# Patient Record
Sex: Male | Born: 1950 | Race: Black or African American | Hispanic: No | Marital: Married | Smoking: Never smoker
Health system: Southern US, Community
[De-identification: ages and names within clinical notes are randomized; demographics above are authoritative.]

## PROBLEM LIST (undated history)

## (undated) DIAGNOSIS — D649 Anemia, unspecified: Secondary | ICD-10-CM

## (undated) DIAGNOSIS — R569 Unspecified convulsions: Secondary | ICD-10-CM

## (undated) DIAGNOSIS — E7800 Pure hypercholesterolemia, unspecified: Secondary | ICD-10-CM

## (undated) DIAGNOSIS — I10 Essential (primary) hypertension: Secondary | ICD-10-CM

## (undated) DIAGNOSIS — C61 Malignant neoplasm of prostate: Secondary | ICD-10-CM

## (undated) DIAGNOSIS — F1020 Alcohol dependence, uncomplicated: Secondary | ICD-10-CM

## (undated) HISTORY — PX: CIRCUMCISION: SUR203

## (undated) HISTORY — DX: Alcohol dependence, uncomplicated: F10.20

## (undated) HISTORY — PX: PROSTATE BIOPSY: SHX241

## (undated) HISTORY — DX: Anemia, unspecified: D64.9

## (undated) HISTORY — DX: Unspecified convulsions: R56.9

## (undated) HISTORY — DX: Pure hypercholesterolemia, unspecified: E78.00

## (undated) HISTORY — PX: NO PAST SURGERIES: SHX2092

---

## 2016-06-20 ENCOUNTER — Encounter (INDEPENDENT_AMBULATORY_CARE_PROVIDER_SITE_OTHER): Payer: Self-pay | Admitting: Internal Medicine

## 2016-07-03 ENCOUNTER — Ambulatory Visit (INDEPENDENT_AMBULATORY_CARE_PROVIDER_SITE_OTHER): Payer: Self-pay | Admitting: Internal Medicine

## 2016-07-04 ENCOUNTER — Ambulatory Visit (INDEPENDENT_AMBULATORY_CARE_PROVIDER_SITE_OTHER): Payer: Medicare Other | Admitting: Internal Medicine

## 2016-07-04 ENCOUNTER — Encounter (INDEPENDENT_AMBULATORY_CARE_PROVIDER_SITE_OTHER): Payer: Self-pay | Admitting: *Deleted

## 2016-07-04 ENCOUNTER — Encounter (INDEPENDENT_AMBULATORY_CARE_PROVIDER_SITE_OTHER): Payer: Self-pay | Admitting: Internal Medicine

## 2016-07-04 ENCOUNTER — Encounter (INDEPENDENT_AMBULATORY_CARE_PROVIDER_SITE_OTHER): Payer: Self-pay

## 2016-07-04 VITALS — BP 112/80 | HR 80 | Temp 98.1°F | Ht 64.0 in | Wt 135.5 lb

## 2016-07-04 DIAGNOSIS — D500 Iron deficiency anemia secondary to blood loss (chronic): Secondary | ICD-10-CM

## 2016-07-04 DIAGNOSIS — E785 Hyperlipidemia, unspecified: Secondary | ICD-10-CM | POA: Insufficient documentation

## 2016-07-04 DIAGNOSIS — F1020 Alcohol dependence, uncomplicated: Secondary | ICD-10-CM | POA: Diagnosis not present

## 2016-07-04 DIAGNOSIS — I10 Essential (primary) hypertension: Secondary | ICD-10-CM | POA: Insufficient documentation

## 2016-07-04 DIAGNOSIS — R569 Unspecified convulsions: Secondary | ICD-10-CM | POA: Insufficient documentation

## 2016-07-04 LAB — CBC
HEMATOCRIT: 26.2 % — AB (ref 38.5–50.0)
HEMOGLOBIN: 9.2 g/dL — AB (ref 13.2–17.1)
MCH: 33.8 pg — AB (ref 27.0–33.0)
MCHC: 35.1 g/dL (ref 32.0–36.0)
MCV: 96.3 fL (ref 80.0–100.0)
MPV: 9.7 fL (ref 7.5–12.5)
Platelets: 188 10*3/uL (ref 140–400)
RBC: 2.72 MIL/uL — ABNORMAL LOW (ref 4.20–5.80)
RDW: 13.8 % (ref 11.0–15.0)
WBC: 5 10*3/uL (ref 3.8–10.8)

## 2016-07-04 NOTE — Progress Notes (Signed)
   Subjective:    Patient ID: Clifford [name], male    DOB: 1951-06-03, 65 y.o.   MRN: [id number]  HPI Referred by Dr. 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 Wife denies hx of patient having anemia.  He denies any rectal bleeding. Appetite is not good. He has lost about 20 pounds over a couple of years. He has a BM daily. No melena.  He has been a heavy drinker in the past. Drinks a fifth or two a week.  He has never been diagnosed with cirrhosis.  He has never had a colonoscopy.  No NSAIDs.  05/31/2016 albumin 4.6, total bili 0.9, ALP 44, AST 76, ALT 44. H and H 9.2 and 25.4, MCH 95.1, Platelet ct 98.  Review of Systems No past medical history on file.  No past surgical history on file.  No Known Allergies  No current outpatient prescriptions on file prior to visit.   No current facility-administered medications on file prior to visit.    Current Outpatient Prescriptions  Medication Sig Dispense Refill  . amLODipine (NORVASC) 10 MG tablet Take 10 mg by mouth daily.    Marland Kitchen lisinopril (PRINIVIL,ZESTRIL) 40 MG tablet Take 40 mg by mouth daily.    Marland Kitchen loratadine (CLARITIN) 10 MG tablet Take 10 mg by mouth daily.    . Multiple Vitamins-Iron (GERITOL EXTEND PO) Take by mouth.    . pantoprazole (PROTONIX) 40 MG tablet Take 40 mg by mouth daily.     No current facility-administered medications for this visit.        Objective:   Physical Exam Blood pressure 112/80, pulse 80, temperature 98.1 F (36.7 C), height 5\' 4"  (1.626 m), weight 135 lb 8 oz (61.5 kg).  Alert and oriented. Skin warm and dry. Oral mucosa is moist.   . Sclera anicteric, conjunctivae is pink. Thyroid not enlarged. No cervical lymphadenopathy. Lungs clear. Heart regular rate and rhythm.  Abdomen is soft. Bowel sounds are positive. No hepatomegaly. No abdominal masses felt. No tenderness.  No edema to lower extremities.  Stool brown and guaiac negative.       Assessment & Plan:  Anemia. Am going to get iron  studies.May also need an EGD.  Will need a colonoscopy,    Alcoholism: Will get an US abdomen to be sure he does not have cirrhosis.  (May need EGD with possible banding).  Further recommendations to follow.

## 2016-07-04 NOTE — Patient Instructions (Signed)
Labs and US.  Further recommendations to follow.  

## 2016-07-05 LAB — FERRITIN: Ferritin: 908 ng/mL — ABNORMAL HIGH (ref 20–380)

## 2016-07-05 LAB — IRON AND TIBC
%SAT: 51 % (ref 15–60)
Iron: 203 ug/dL — ABNORMAL HIGH (ref 50–180)
TIBC: 396 ug/dL (ref 250–425)
UIBC: 193 ug/dL (ref 125–400)

## 2016-07-11 ENCOUNTER — Telehealth (INDEPENDENT_AMBULATORY_CARE_PROVIDER_SITE_OTHER): Payer: Self-pay | Admitting: *Deleted

## 2016-07-11 ENCOUNTER — Ambulatory Visit (HOSPITAL_COMMUNITY)
Admission: RE | Admit: 2016-07-11 | Discharge: 2016-07-11 | Disposition: A | Discharge status: Home / Self Care | Payer: Medicare Other | Source: Ambulatory Visit | Attending: Internal Medicine | Admitting: Internal Medicine

## 2016-07-11 ENCOUNTER — Encounter (INDEPENDENT_AMBULATORY_CARE_PROVIDER_SITE_OTHER): Payer: Self-pay | Admitting: Internal Medicine

## 2016-07-11 ENCOUNTER — Telehealth (INDEPENDENT_AMBULATORY_CARE_PROVIDER_SITE_OTHER): Payer: Self-pay | Admitting: Internal Medicine

## 2016-07-11 ENCOUNTER — Other Ambulatory Visit (INDEPENDENT_AMBULATORY_CARE_PROVIDER_SITE_OTHER): Payer: Self-pay | Admitting: Internal Medicine

## 2016-07-11 ENCOUNTER — Encounter (INDEPENDENT_AMBULATORY_CARE_PROVIDER_SITE_OTHER): Payer: Self-pay | Admitting: *Deleted

## 2016-07-11 DIAGNOSIS — R932 Abnormal findings on diagnostic imaging of liver and biliary tract: Secondary | ICD-10-CM | POA: Insufficient documentation

## 2016-07-11 DIAGNOSIS — F1020 Alcohol dependence, uncomplicated: Secondary | ICD-10-CM | POA: Insufficient documentation

## 2016-07-11 DIAGNOSIS — D638 Anemia in other chronic diseases classified elsewhere: Secondary | ICD-10-CM

## 2016-07-11 MED ORDER — PEG 3350-KCL-NA BICARB-NACL 420 G PO SOLR: 4000.0000 mL | Freq: Once | ORAL | 0 refills | Status: AC

## 2016-07-11 MED ORDER — PEG 3350-KCL-NA BICARB-NACL 420 G PO SOLR: 4000.0000 mL | Freq: Once | ORAL | 0 refills | Status: DC

## 2016-07-11 NOTE — Telephone Encounter (Signed)
Clifford [name], Colonoscopy. I have spoken with wife.

## 2016-07-11 NOTE — Telephone Encounter (Signed)
Patient needs trilye

## 2016-07-11 NOTE — Telephone Encounter (Signed)
Patient needs trilyte 

## 2016-07-11 NOTE — Telephone Encounter (Signed)
TCS sch'd 09/13/16, patient wife aware, instructions mailed

## 2016-09-13 ENCOUNTER — Ambulatory Visit (HOSPITAL_COMMUNITY)
Admission: RE | Admit: 2016-09-13 | Discharge: 2016-09-13 | Disposition: A | Discharge status: Home / Self Care | Payer: Medicare Other | Source: Ambulatory Visit | Attending: Internal Medicine | Admitting: Internal Medicine

## 2016-09-13 ENCOUNTER — Encounter (HOSPITAL_COMMUNITY)
Admission: RE | Disposition: A | Discharge status: Home / Self Care | Payer: Self-pay | Source: Ambulatory Visit | Attending: Internal Medicine

## 2016-09-13 ENCOUNTER — Encounter (HOSPITAL_COMMUNITY): Payer: Self-pay

## 2016-09-13 DIAGNOSIS — K648 Other hemorrhoids: Secondary | ICD-10-CM | POA: Insufficient documentation

## 2016-09-13 DIAGNOSIS — K644 Residual hemorrhoidal skin tags: Secondary | ICD-10-CM | POA: Diagnosis not present

## 2016-09-13 DIAGNOSIS — D649 Anemia, unspecified: Secondary | ICD-10-CM | POA: Diagnosis not present

## 2016-09-13 DIAGNOSIS — Z1211 Encounter for screening for malignant neoplasm of colon: Secondary | ICD-10-CM | POA: Diagnosis not present

## 2016-09-13 DIAGNOSIS — I10 Essential (primary) hypertension: Secondary | ICD-10-CM | POA: Diagnosis not present

## 2016-09-13 DIAGNOSIS — E7800 Pure hypercholesterolemia, unspecified: Secondary | ICD-10-CM | POA: Diagnosis not present

## 2016-09-13 DIAGNOSIS — D638 Anemia in other chronic diseases classified elsewhere: Secondary | ICD-10-CM

## 2016-09-13 HISTORY — PX: COLONOSCOPY: SHX5424

## 2016-09-13 HISTORY — DX: Essential (primary) hypertension: I10

## 2016-09-13 LAB — CBC
HCT: 29.2 % — ABNORMAL LOW (ref 39.0–52.0)
Hemoglobin: 9.8 g/dL — ABNORMAL LOW (ref 13.0–17.0)
MCH: 32 pg (ref 26.0–34.0)
MCHC: 33.6 g/dL (ref 30.0–36.0)
MCV: 95.4 fL (ref 78.0–100.0)
PLATELETS: 177 10*3/uL (ref 150–400)
RBC: 3.06 MIL/uL — ABNORMAL LOW (ref 4.22–5.81)
RDW: 13.2 % (ref 11.5–15.5)
WBC: 6.5 10*3/uL (ref 4.0–10.5)

## 2016-09-13 SURGERY — COLONOSCOPY
Anesthesia: Moderate Sedation

## 2016-09-13 MED ORDER — MIDAZOLAM HCL 5 MG/5ML IJ SOLN
INTRAMUSCULAR | Status: DC | PRN
  Administered 2016-09-13 (×2): 2 mg via INTRAVENOUS

## 2016-09-13 MED ORDER — MEPERIDINE HCL 50 MG/ML IJ SOLN
INTRAMUSCULAR | Status: AC
Start: 2016-09-13 — End: 2016-09-14
  Filled 2016-09-13: qty 1

## 2016-09-13 MED ORDER — SODIUM CHLORIDE 0.9 % IV SOLN
INTRAVENOUS | Status: DC
  Administered 2016-09-13: 13:00:00 via INTRAVENOUS

## 2016-09-13 MED ORDER — MIDAZOLAM HCL 5 MG/5ML IJ SOLN
INTRAMUSCULAR | Status: AC
  Filled 2016-09-13: qty 10

## 2016-09-13 MED ORDER — MEPERIDINE HCL 50 MG/ML IJ SOLN
INTRAMUSCULAR | Status: DC | PRN
  Administered 2016-09-13 (×2): 25 mg

## 2016-09-13 NOTE — Op Note (Signed)
[facility] Patient Name: Clifford [name] Procedure Date: 09/13/2016 1:38 PM MRN: [id number] Date of Birth: 1951-05-19 Attending MD: Hildred Laser , MD CSN: [id number] Age: 64 Admit Type: Outpatient Procedure:                Colonoscopy Indications:              Screening for colorectal malignant neoplasm Providers:                Hildred Laser, MD, Lurline Del, RN, Charlyne Petrin                            RN, RN, Purcell Nails. [place], [employer] Referring MD:             [name], MD Medicines:                Meperidine 50 mg IV, Midazolam 4 mg IV Complications:            No immediate complications. Estimated Blood Loss:     Estimated blood loss: none. Procedure:                Pre-Anesthesia Assessment:                           - Prior to the procedure, a History and Physical                            was performed, and patient medications and                            allergies were reviewed. The patient's tolerance of                            previous anesthesia was also reviewed. The risks                            and benefits of the procedure and the sedation                            options and risks were discussed with the patient.                            All questions were answered, and informed consent                            was obtained. Prior Anticoagulants: The patient has                            taken no previous anticoagulant or antiplatelet                            agents. ASA Grade Assessment: II - A patient with                            mild systemic disease. After reviewing the risks  and benefits, the patient was deemed in                            satisfactory condition to undergo the procedure.                           After obtaining informed consent, the colonoscope                            was passed under direct vision. Throughout the                            procedure, the patient's blood  pressure, pulse, and                            oxygen saturations were monitored continuously. The                            [id number]) scope was introduced through                            the anus and advanced to the the cecum, identified                            by appendiceal orifice and ileocecal valve. The                            colonoscopy was performed without difficulty. The                            patient tolerated the procedure well. The quality                            of the bowel preparation was adequate. The                            ileocecal valve, appendiceal orifice, and rectum                            were photographed. Scope In: 1:55:09 PM Scope Out: 2:11:29 PM Scope Withdrawal Time: 0 hours 9 minutes 16 seconds  Total Procedure Duration: 0 hours 16 minutes 20 seconds  Findings:      The colon (entire examined portion) appeared normal.      External and internal hemorrhoids were found during retroflexion. The       hemorrhoids were small. Impression:               - The entire examined colon is normal.                           - External and internal hemorrhoids.                           - No specimens collected.  Please note patient's hgb is 9.8 g and platelet                            count 177K Moderate Sedation:      Moderate (conscious) sedation was administered by the endoscopy nurse       and supervised by the endoscopist. The following parameters were       monitored: oxygen saturation, heart rate, blood pressure, CO2       capnography and response to care. Total physician intraservice time was       23 minutes. Recommendation:           - Patient has a contact number available for                            emergencies. The signs and symptoms of potential                            delayed complications were discussed with the                            patient. Return to normal activities tomorrow.                             Written discharge instructions were provided to the                            patient.                           - Resume previous diet today.                           - Resume previous diet.                           - Continue present medications.                           - Repeat colonoscopy in 10 years for screening                            purposes. Procedure Code(s):        --- Professional ---                           [phone], Colonoscopy, flexible; diagnostic, including                            collection of specimen(s) by brushing or washing,                            when performed (separate procedure)                           99152, Moderate sedation services provided by the  same physician or other qualified health care                            professional performing the diagnostic or                            therapeutic service that the sedation supports,                            requiring the presence of an independent trained                            observer to assist in the monitoring of the                            patient's level of consciousness and physiological                            status; initial 15 minutes of intraservice time,                            patient age 81 years or older                           [phone], Moderate sedation services; each additional                            15 minutes intraservice time Diagnosis Code(s):        --- Professional ---                           Z12.11, Encounter for screening for malignant                            neoplasm of colon                           K64.8, Other hemorrhoids CPT copyright 2016 American Medical Association. All rights reserved. The codes documented in this report are preliminary and upon coder review may  be revised to meet current compliance requirements. Hildred Laser, MD Hildred Laser, MD 09/13/2016 2:20:40 PM This  report has been signed electronically. Number of Addenda: 0

## 2016-09-13 NOTE — H&P (Signed)
Clifford [name] is an 65 y.o. male.   Chief Complaint: Patient is here for colonoscopy. HPI: Patient is 65 year old male who was recently found to be anemic. However he denies melena or rectal bleeding. Patient has long history of excessive alcohol use. He has lost some weight over the last couple months. He says he has slacked off on his drinking. He was also noted to have platelet count of 98,000 in July 2017. Iron studies are not consistent with iron deficiency anemia. Similarly B12 and folate levels were normal. AST was mildly elevated consistent with alcoholic hepatitis. Ultrasound shows fatty liver but no changes to suggest cirrhosis or portal hypertension. He is undergoing colonoscopy primarily for screening purposes. Family history is negative for CRC.  Past Medical History:  Diagnosis Date  . Alcoholism ([place])   . Anemia   . High cholesterol   . Hypertension   . Seizures ([place])     Past Surgical History:  Procedure Laterality Date  . NO PAST SURGERIES      History reviewed. No pertinent family history. Social History:  reports that he has never smoked. He has never used smokeless tobacco. He reports that he drinks alcohol. He reports that he does not use drugs.  Allergies:  Allergies  Allergen Reactions  . Lisinopril Swelling    Medications Prior to Admission  Medication Sig Dispense Refill  . acetaminophen (TYLENOL) 500 MG tablet Take 500 mg by mouth every 6 (six) hours as needed (for pain.).    Marland Kitchen amLODipine (NORVASC) 10 MG tablet Take 10 mg by mouth daily.    . cetirizine (ZYRTEC) 10 MG tablet Take 10 mg by mouth daily as needed for allergies.    . Multiple Vitamin (MULTIVITAMIN WITH MINERALS) TABS tablet Take 1 tablet by mouth daily.    . pantoprazole (PROTONIX) 40 MG tablet Take 40 mg by mouth daily.      No results found for this or any previous visit (from the past 48 hour(s)). No results found.  ROS  Blood pressure (!) 154/90, pulse 87, temperature 98.9 F  (37.2 C), temperature source Oral, resp. rate (!) 22, height 5\' 4"  (1.626 m), weight 135 lb (61.2 kg), SpO2 100 %. Physical Exam  Constitutional: He appears well-developed and well-nourished.  HENT:  Mouth/Throat: Oropharynx is clear and moist.  Eyes: Conjunctivae are normal. No scleral icterus.  Neck: No thyromegaly present.  Cardiovascular: Normal rate, regular rhythm and normal heart sounds.   No murmur heard. Respiratory: Effort normal and breath sounds normal.  GI: Soft. He exhibits no distension and no mass. There is no tenderness.  Musculoskeletal: He exhibits no edema.  Lymphadenopathy:    He has no cervical adenopathy.  Neurological: He is alert.  Skin: Skin is warm and dry.     Assessment/Plan Average risk screening colonoscopy. Patient has anemia possibly due to chronic disease or toxic effect of alcohol on bone marrow. Will check CBC today.  Hildred Laser, MD 09/13/2016, 1:36 PM

## 2016-09-13 NOTE — Discharge Instructions (Signed)
Resume usual medications and diet. No driving for 24 hours. Follow with Dr. Kenton Kingfisher in 2 months.       Colonoscopy, Care After These instructions give you information on caring for yourself after your procedure. Your doctor may also give you more specific instructions. Call your doctor if you have any problems or questions after your procedure. HOME CARE  Do not drive for 24 hours.  Do not sign important papers or use machinery for 24 hours.  You may shower.  You may go back to your usual activities, but go slower for the first 24 hours.  Take rest breaks often during the first 24 hours.  Walk around or use warm packs on your belly (abdomen) if you have belly cramping or gas.  Drink enough fluids to keep your pee (urine) clear or pale yellow.  Resume your normal diet. Avoid heavy or fried foods.  Avoid drinking alcohol for 24 hours or as told by your doctor.  Only take medicines as told by your doctor. If a tissue sample (biopsy) was taken during the procedure:   Do not take aspirin or blood thinners for 7 days, or as told by your doctor.  Do not drink alcohol for 7 days, or as told by your doctor.  Eat soft foods for the first 24 hours. GET HELP IF: You still have a small amount of blood in your poop (stool) 2-3 days after the procedure. GET HELP RIGHT AWAY IF:  You have more than a small amount of blood in your poop.  You see clumps of tissue (blood clots) in your poop.  Your belly is puffy (swollen).  You feel sick to your stomach (nauseous) or throw up (vomit).  You have a fever.  You have belly pain that gets worse and medicine does not help. MAKE SURE YOU:  Understand these instructions.  Will watch your condition.  Will get help right away if you are not doing well or get worse.   This information is not intended to replace advice given to you by your health care provider. Make sure you discuss any questions you have with your health care  provider.   Document Released: 12/02/2010 Document Revised: 11/04/2013 Document Reviewed: 07/07/2013 Elsevier Interactive Patient Education 2016 [employer].   Hemorrhoids Hemorrhoids are swollen veins around the rectum or anus. There are two types of hemorrhoids:   Internal hemorrhoids. These occur in the veins just inside the rectum. They may poke through to the outside and become irritated and painful.  External hemorrhoids. These occur in the veins outside the anus and can be felt as a painful swelling or hard lump near the anus. CAUSES  Pregnancy.   Obesity.   Constipation or diarrhea.   Straining to have a bowel movement.   Sitting for long periods on the toilet.  Heavy lifting or other activity that caused you to strain.  Anal intercourse. SYMPTOMS   Pain.   Anal itching or irritation.   Rectal bleeding.   Fecal leakage.   Anal swelling.   One or more lumps around the anus.  DIAGNOSIS  Your caregiver may be able to diagnose hemorrhoids by visual examination. Other examinations or tests that may be performed include:   Examination of the rectal area with a gloved hand (digital rectal exam).   Examination of anal canal using a small tube (scope).   A blood test if you have lost a significant amount of blood.  A test to look inside the colon (sigmoidoscopy  or colonoscopy). TREATMENT Most hemorrhoids can be treated at home. However, if symptoms do not seem to be getting better or if you have a lot of rectal bleeding, your caregiver may perform a procedure to help make the hemorrhoids get smaller or remove them completely. Possible treatments include:   Placing a rubber band at the base of the hemorrhoid to cut off the circulation (rubber band ligation).   Injecting a chemical to shrink the hemorrhoid (sclerotherapy).   Using a tool to burn the hemorrhoid (infrared light therapy).   Surgically removing the hemorrhoid (hemorrhoidectomy).    Stapling the hemorrhoid to block blood flow to the tissue (hemorrhoid stapling).  HOME CARE INSTRUCTIONS   Eat foods with fiber, such as whole grains, beans, nuts, fruits, and vegetables. Ask your doctor about taking products with added fiber in them (fibersupplements).  Increase fluid intake. Drink enough water and fluids to keep your urine clear or pale yellow.   Exercise regularly.   Go to the bathroom when you have the urge to have a bowel movement. Do not wait.   Avoid straining to have bowel movements.   Keep the anal area dry and clean. Use wet toilet paper or moist towelettes after a bowel movement.   Medicated creams and suppositories may be used or applied as directed.   Only take over-the-counter or prescription medicines as directed by your caregiver.   Take warm sitz baths for 15-20 minutes, 3-4 times a day to ease pain and discomfort.   Place ice packs on the hemorrhoids if they are tender and swollen. Using ice packs between sitz baths may be helpful.   Put ice in a plastic bag.   Place a towel between your skin and the bag.   Leave the ice on for 15-20 minutes, 3-4 times a day.   Do not use a donut-shaped pillow or sit on the toilet for long periods. This increases blood pooling and pain.  SEEK MEDICAL CARE IF:  You have increasing pain and swelling that is not controlled by treatment or medicine.  You have uncontrolled bleeding.  You have difficulty or you are unable to have a bowel movement.  You have pain or inflammation outside the area of the hemorrhoids. MAKE SURE YOU:  Understand these instructions.  Will watch your condition.  Will get help right away if you are not doing well or get worse.   This information is not intended to replace advice given to you by your health care provider. Make sure you discuss any questions you have with your health care provider.   Document Released: 10/27/2000 Document Revised: 10/16/2012  Document Reviewed: 09/03/2012 Elsevier Interactive Patient Education [employer].

## 2016-09-15 ENCOUNTER — Encounter (HOSPITAL_COMMUNITY): Payer: Self-pay | Admitting: Internal Medicine

## 2016-10-26 ENCOUNTER — Other Ambulatory Visit (HOSPITAL_COMMUNITY): Payer: Self-pay | Admitting: Urology

## 2016-10-26 DIAGNOSIS — C61 Malignant neoplasm of prostate: Secondary | ICD-10-CM

## 2016-11-09 ENCOUNTER — Encounter (HOSPITAL_COMMUNITY)
Admission: RE | Admit: 2016-11-09 | Discharge: 2016-11-09 | Disposition: A | Discharge status: Home / Self Care | Payer: Medicare Other | Source: Ambulatory Visit | Attending: Urology | Admitting: Urology

## 2016-11-09 ENCOUNTER — Encounter (INDEPENDENT_AMBULATORY_CARE_PROVIDER_SITE_OTHER): Payer: Self-pay

## 2016-11-09 DIAGNOSIS — C61 Malignant neoplasm of prostate: Secondary | ICD-10-CM | POA: Diagnosis present

## 2016-11-09 MED ORDER — TECHNETIUM TC 99M MEDRONATE IV KIT
25.0000 | PACK | Freq: Once | INTRAVENOUS | Status: AC | PRN
  Administered 2016-11-09: 21.9 via INTRAVENOUS

## 2016-11-17 ENCOUNTER — Encounter: Payer: Self-pay | Admitting: Radiation Oncology

## 2016-12-01 ENCOUNTER — Encounter: Payer: Self-pay | Admitting: Radiation Oncology

## 2016-12-01 NOTE — Progress Notes (Addendum)
GU Location of Tumor / Histology: prostatic adenocarcinoma  If Prostate Cancer, Gleason Score is (4 + 4) and PSA is (13.1) on 08/24/16.   Clifford [name] presented with an elevated PSA of 13.1 in October 2017.   Biopsies of prostate (if applicable) revealed:    Past/Anticipated interventions by urology, if any: prostate biopsy, referral to Dr. Tammi Klippel, planning for Lupron x 2 years  Past/Anticipated interventions by medical oncology, if any: no  Weight changes, if any: no  Bowel/Bladder complaints, if any:  Denies having any bowel issues - does have constipation occasionally.  Reports nocturia x 3 depending on what he drinks.  Denies having hematuria.  IPSS score of 7.  Nausea/Vomiting, if any: no  Pain issues, if any:  no  SAFETY ISSUES:  Prior radiation? no  Pacemaker/ICD? no  Possible current pregnancy? no  Is the patient on methotrexate? no  Current Complaints / other details:  66 year old male. Married. Lives in [place]. Interested most in seeds. Prostate volume: 18.36. CT of pelvis and bone scan negative for metastatic disease.  Patient is here with his wife and son.  Apt to return for Lupron? Not scheduled yet.  There was an insurance issues which has been straightened out.  BP 128/64 (BP Location: Right Arm, Patient Position: Sitting)   Pulse (!) 108   Temp 98.2 F (36.8 C) (Oral)   Ht 5\' 4"  (1.626 m)   Wt 136 lb (61.7 kg)   SpO2 100%   BMI 23.34 kg/m    Wt Readings from Last 3 Encounters:  12/04/16 136 lb (61.7 kg)  09/13/16 135 lb (61.2 kg)  07/04/16 135 lb 8 oz (61.5 kg)

## 2016-12-04 ENCOUNTER — Encounter: Payer: Self-pay | Admitting: Radiation Oncology

## 2016-12-04 ENCOUNTER — Encounter: Payer: Self-pay | Admitting: Medical Oncology

## 2016-12-04 ENCOUNTER — Ambulatory Visit
Admission: RE | Admit: 2016-12-04 | Discharge: 2016-12-04 | Disposition: A | Discharge status: Home / Self Care | Payer: Medicare Other | Source: Ambulatory Visit | Attending: Radiation Oncology | Admitting: Radiation Oncology

## 2016-12-04 DIAGNOSIS — Z79899 Other long term (current) drug therapy: Secondary | ICD-10-CM | POA: Insufficient documentation

## 2016-12-04 DIAGNOSIS — C61 Malignant neoplasm of prostate: Secondary | ICD-10-CM

## 2016-12-04 HISTORY — DX: Malignant neoplasm of prostate: C61

## 2016-12-04 NOTE — Progress Notes (Signed)
Radiation Oncology         [phone] ________________________________  Initial Outpatient Consultation  Name: Clifford [name] MRN: [id number]  Date: 12/04/2016  DOB: Dec 02, 1950  [id number] L, FNP  Kathie Rhodes, MD   REFERRING PHYSICIAN: Kathie Rhodes, MD  DIAGNOSIS: 66 y.o. gentleman with stage T1c adenocarcinoma of the prostate with a Gleason's score of 4+4 and a PSA of 13.1    ICD-9-CM ICD-10-CM   1. Malignant neoplasm of prostate ([place]) 185 C61     HISTORY OF PRESENT ILLNESS: Clifford [name] is a 66 y.o. male with a diagnosis of prostate cancer. He was noted to have an elevated PSA of 13.1 by his primary care physician, Dr. Kenton Kingfisher.  Accordingly, he was referred for evaluation in urology by Dr. Karsten Ro on 10/18/16,  digital rectal examination was performed at that time revealing no nodularity.  The patient proceeded to transrectal ultrasound with 12 biopsies of the prostate on 10/21/16.  The prostate volume measured 18.36 cc.  Out of 12 core biopsies, 6 were positive.  The maximum Gleason score was 4+4, and this was seen in right apex. A bone scan and CT pelvis performed on 11/09/16 was negative for metastatic disease.  The patient reviewed the biopsy results with his urologist and he has kindly been referred today for discussion of potential radiation treatment options.  PREVIOUS RADIATION THERAPY: No  PAST MEDICAL HISTORY:  Past Medical History:  Diagnosis Date  . Alcoholism ([place])   . Anemia   . High cholesterol   . Hypertension   . Prostate cancer ([place])   . Seizures ([place])       PAST SURGICAL HISTORY: Past Surgical History:  Procedure Laterality Date  . CIRCUMCISION    . COLONOSCOPY N/A 09/13/2016   Procedure: COLONOSCOPY;  Surgeon: Rogene Houston, MD;  [facility];  Service: Endoscopy;  Laterality: N/A;  2:15  . NO PAST SURGERIES    . PROSTATE BIOPSY      FAMILY HISTORY:  Family History  Problem Relation Age of Onset  . Alzheimer's  disease Mother   . Hypertension Mother   . Hypertension Father   . Stroke Father   . Prostate cancer Father   . Hypertension Brother     SOCIAL HISTORY:  Social History   Social History  . Marital status: Married    Spouse name: N/A  . Number of children: N/A  . Years of education: N/A   Occupational History  . Not on file.   Social History Main Topics  . Smoking status: Never Smoker  . Smokeless tobacco: Never Used  . Alcohol use Yes     Comment: 1-2 bottles of Gin every few days  . Drug use: No  . Sexual activity: Not on file   Other Topics Concern  . Not on file   Social History Narrative  . No narrative on file    ALLERGIES: Lisinopril  MEDICATIONS:  Current Outpatient Prescriptions  Medication Sig Dispense Refill  . acetaminophen (TYLENOL) 500 MG tablet Take 500 mg by mouth every 6 (six) hours as needed (for pain.).    Marland Kitchen amLODipine (NORVASC) 10 MG tablet Take 10 mg by mouth daily.    . Multiple Vitamin (MULTIVITAMIN WITH MINERALS) TABS tablet Take 1 tablet by mouth daily.    . pantoprazole (PROTONIX) 40 MG tablet Take 40 mg by mouth daily.    . cetirizine (ZYRTEC) 10 MG tablet Take 10 mg by mouth daily as needed for allergies.    Marland Kitchen  folic acid (FOLVITE) 1 MG tablet Take 1 mg by mouth daily.    Marland Kitchen thiamine 100 MG tablet Take 100 mg by mouth daily.     No current facility-administered medications for this encounter.     REVIEW OF SYSTEMS:  On review of systems, the patient reports that he is doing well overall. He denies any chest pain, shortness of breath, cough, fevers, chills, night sweats, unintended weight changes. He denies any bowel  disturbances, and denies abdominal pain, nausea or vomiting. He denies any new musculoskeletal or joint aches or pains. The patient completed an IPSS and IIEF questionnaire.  His IPSS score was 7 indicating mild urinary outflow obstructive symptoms. He reports nocturia x 3. Denies hematuria.  He indicated that his erectile  function is able to complete sexual activity less than half of the time. A complete review of systems is obtained and is otherwise negative.    PHYSICAL EXAM:  Wt Readings from Last 3 Encounters:  12/04/16 136 lb (61.7 kg)  09/13/16 135 lb (61.2 kg)  07/04/16 135 lb 8 oz (61.5 kg)   Temp Readings from Last 3 Encounters:  12/04/16 98.2 F (36.8 C) (Oral)  09/13/16 98.4 F (36.9 C) (Oral)  07/04/16 98.1 F (36.7 C)   BP Readings from Last 3 Encounters:  12/04/16 128/64  09/13/16 120/73  07/04/16 112/80   Pulse Readings from Last 3 Encounters:  12/04/16 (!) 108  09/13/16 85  07/04/16 80   In general this is a well appearing african [place] man in no acute distress. He is alert and oriented x4 and appropriate throughout the examination. HEENT reveals that the patient is normocephalic, atraumatic. EOMs are intact. PERRLA. Skin is intact without any evidence of gross lesions. Cardiovascular exam reveals a regular rate and rhythm, no clicks rubs or murmurs are auscultated. Chest is clear to auscultation bilaterally. Lymphatic assessment is performed and does not reveal any adenopathy in the cervical, supraclavicular, axillary, or inguinal chains. Abdomen has active bowel sounds in all quadrants and is intact. The abdomen is soft, non tender, it is slightly distended. Lower extremities are negative for pretibial pitting edema, deep calf tenderness, cyanosis or clubbing.   KPS = 80  100 - Normal; no complaints; no evidence of disease. 90   - Able to carry on normal activity; minor signs or symptoms of disease. 80   - Normal activity with effort; some signs or symptoms of disease. 98   - Cares for self; unable to carry on normal activity or to do active work. 60   - Requires occasional assistance, but is able to care for most of his personal needs. 50   - Requires considerable assistance and frequent medical care. 69   - Disabled; requires special care and assistance. 60   - Severely  disabled; hospital admission is indicated although death not imminent. 87   - Very sick; hospital admission necessary; active supportive treatment necessary. 10   - Moribund; fatal processes progressing rapidly. 0     - Dead  Karnofsky DA, Abelmann [place], Craver LS and Burchenal [facility] [phone]) The use of the nitrogen mustards in the palliative treatment of carcinoma: with particular reference to bronchogenic carcinoma Cancer 1 [phone]  LABORATORY DATA:  Lab Results  Component Value Date   WBC 6.5 09/13/2016   HGB 9.8 (L) 09/13/2016   HCT 29.2 (L) 09/13/2016   MCV 95.4 09/13/2016   PLT 177 09/13/2016   No results found for: NA, K, CL, CO2 No results found  for: ALT, AST, GGT, ALKPHOS, BILITOT   RADIOGRAPHY: Nm Bone Scan Whole Body  Result Date: 11/09/2016 CLINICAL DATA:  Prostate cancer, evaluate for metastatic disease. EXAM: NUCLEAR MEDICINE WHOLE BODY BONE SCAN TECHNIQUE: Whole body anterior and posterior images were obtained approximately 3 hours after intravenous injection of radiopharmaceutical. RADIOPHARMACEUTICALS:  21.9 mCi Technetium-26m MDP IV COMPARISON:  CT pelvis 11/09/2016 FINDINGS: A whole-body bone scan demonstrates homogeneous uptake of the tracer within bony structures without abnormal foci of increased activity to suggest metastatic disease. IMPRESSION: No evidence of bony metastatic disease. Electronically Signed   By: Lahoma Crocker M.D.   On: 11/09/2016 18:49      IMPRESSION/PLAN: 1. 66 y.o. gentleman with high risk stage T1c adenocarcinoma of the prostate with a Gleason Score of 4+4 and a PSA of 13.1. Dr. Tammi Klippel reviewed the findings and workup thus far.  We discussed the natural history of prostate cancer.  We reviewed the the implications of T-stage, Gleason's Score, and PSA on decision-making and outcomes in prostate cancer. Accordingly he is eligible for a variety of potential treatment options including prostatectomy, or external beam radiation. We discussed radiation  treatment in the management of prostate cancer with regard to the logistics and delivery of external beam radiation treatment as well as the logistics and delivery of prostate brachytherapy. He is not a candidate for seed implant due to the size of his gland, and rather, we would recommend external radiotherapy. He is interested in having this performed in [place], [place] since this is closer to home and we will make a referral to that office. I've called Dr. Simone Curia office and he will have Lupron and fiducial markers on 12/13/16 with Dr. Karsten Ro. 2. Alcoholism. I encouraged the patient to consider evaluation for treatment of this. He is not ready to make a decision about this. I've given him information on how to contact behavioral health through [facility] if he changes his mind.  The above documentation reflects my direct findings during this shared patient visit. Please see the separate note by Dr. Tammi Klippel on this date for the remainder of the patient's plan of care.   Carola Rhine, PAC  This document serves as a record of services personally performed by Shona Simpson, PA-C and Tyler Pita, MD. It was created on their behalf by Bethann Humble, a trained medical scribe. The creation of this record is based on the scribe's personal observations and the provider's statements to them. This document has been checked and approved by the attending provider.

## 2016-12-04 NOTE — Progress Notes (Signed)
See progress note under physician encounter. 

## 2016-12-04 NOTE — Progress Notes (Signed)
Clifford [name] lives closer to [place] and has selected to get his radiation in [place].

## 2016-12-13 ENCOUNTER — Telehealth: Payer: Self-pay | Admitting: *Deleted

## 2016-12-13 NOTE — Telephone Encounter (Signed)
Called patient to update regarding appt. In [place], lvm for a return call

## 2016-12-15 ENCOUNTER — Telehealth: Payer: Self-pay | Admitting: *Deleted

## 2016-12-15 NOTE — Telephone Encounter (Signed)
CALLED PATIENT TO UPDATE ABOUT [place] APPT., LVM FOR A RETURN CALL

## 2017-10-10 ENCOUNTER — Encounter (INDEPENDENT_AMBULATORY_CARE_PROVIDER_SITE_OTHER): Payer: Self-pay | Admitting: Internal Medicine

## 2017-10-10 ENCOUNTER — Ambulatory Visit (INDEPENDENT_AMBULATORY_CARE_PROVIDER_SITE_OTHER): Payer: Medicare Other | Admitting: Internal Medicine

## 2017-10-10 ENCOUNTER — Encounter (INDEPENDENT_AMBULATORY_CARE_PROVIDER_SITE_OTHER): Payer: Self-pay

## 2017-10-10 VITALS — BP 120/72 | HR 60 | Temp 97.9°F | Ht 66.0 in | Wt 147.6 lb

## 2017-10-10 DIAGNOSIS — K625 Hemorrhage of anus and rectum: Secondary | ICD-10-CM

## 2017-10-10 MED ORDER — HYDROCORTISONE ACE-PRAMOXINE 1-1 % RE FOAM: 1.0000 | Freq: Two times a day (BID) | RECTAL | 0 refills | Status: DC

## 2017-10-10 NOTE — Progress Notes (Signed)
Subjective:    Patient ID: Clifford [name], male    DOB: 02-13-1951, 66 y.o.   MRN: [id number]  HPI Referred by  Dr. Lynden Ang for positive stool card.  ([place])  for positive stool card. He was last seen by me in August of 2017 for anemia follow up States he has seen blood in his stool. States blood on the stool and on the tissue. Noticed about 2 weeks ago.  He finished radiation in July or August in [place], [place].    He underwent a colonoscopy in November of 2017 which revealed:                            - External and internal hemorrhoids.                           - No specimens collected.                           Please note patient's hgb is 9.8 g and platelet                            count 177K Hx of Prostate cancer. He is followed by [facility] and his last Lupron injection was in August 2018. Wife states he will receive 2 more next year. Underwent 22 rounds of radiation and finished in August of this year in [place].  Has a BM about every other day. Stools are hard sometimes.  Appetite is okay. No weight loss.   07/11/2016 US abdomen; 30 yr hx of etoh abuse. Hx of anemia.  IMPRESSION: Mildly increased hepatic echotexture likely reflects fatty infiltrative change. There is no evidence of cirrhotic change. Normal size spleen.  No acute intra-abdominal abnormality is observed.  Drinks a bout 2/5's a weeks.    Review of Systems Past Medical History:  Diagnosis Date  . Alcoholism ([place])   . Anemia   . High cholesterol   . Hypertension   . Prostate cancer ([place])   . Seizures ([place])     Past Surgical History:  Procedure Laterality Date  . CIRCUMCISION    . COLONOSCOPY N/A 09/13/2016   Procedure: COLONOSCOPY;  Surgeon: Rogene Houston, MD;  [facility];  Service: Endoscopy;  Laterality: N/A;  2:15  . NO PAST SURGERIES    . PROSTATE BIOPSY      Allergies  Allergen Reactions  . Lisinopril Swelling    Current  Outpatient Medications on File Prior to Visit  Medication Sig Dispense Refill  . acetaminophen (TYLENOL) 500 MG tablet Take 500 mg by mouth every 6 (six) hours as needed (for pain.).    Marland Kitchen amLODipine (NORVASC) 10 MG tablet Take 10 mg by mouth daily.    Marland Kitchen loratadine (CLARITIN) 10 MG tablet Take 10 mg by mouth daily.    . Multiple Vitamin (MULTIVITAMIN WITH MINERALS) TABS tablet Take 1 tablet by mouth daily.    . pantoprazole (PROTONIX) 40 MG tablet Take 40 mg by mouth daily.     No current facility-administered medications on file prior to visit.         Objective:   Physical Exam Blood pressure 120/72, pulse 60, temperature 97.9 F (36.6 C), height 5\' 6"  (1.676 m), weight 147 lb 9.6 oz (67 kg). Alert and oriented. Skin warm  and dry. Oral mucosa is moist.   . Sclera anicteric, conjunctivae is pink. Thyroid not enlarged. No cervical lymphadenopathy. Lungs clear. Heart regular rate and rhythm.  Abdomen is soft. Bowel sounds are positive. No hepatomegaly. No abdominal masses felt. No tenderness.  No edema to lower extremities.  Rectal exam: fresh blood noted. No masses felt.          Assessment & Plan:  Guaiac positive stool.  BRRB bleeding noted on rectal exam today. Last colonoscopy in 2017. I will discuss with Dr. Laural Golden.

## 2017-10-10 NOTE — Patient Instructions (Signed)
Rx for Proctofoam sent to his pharmacy.  Will discuss with Dr. Laural Golden.

## 2017-11-07 ENCOUNTER — Telehealth (INDEPENDENT_AMBULATORY_CARE_PROVIDER_SITE_OTHER): Payer: Self-pay | Admitting: Internal Medicine

## 2017-11-07 NOTE — Telephone Encounter (Signed)
Rx for Proctozone 2.5% called to his pharmacy. She will call with a PR in 2 weeks.    Hope, OV in 4 weeks.

## 2017-11-08 ENCOUNTER — Encounter (INDEPENDENT_AMBULATORY_CARE_PROVIDER_SITE_OTHER): Payer: Self-pay | Admitting: Internal Medicine

## 2017-11-08 NOTE — Telephone Encounter (Signed)
Patient was given an appointment for 12/06/17 at 10:45am with Deberah Castle, NP.  A letter was mailed to the patient.

## 2017-11-22 ENCOUNTER — Telehealth (INDEPENDENT_AMBULATORY_CARE_PROVIDER_SITE_OTHER): Payer: Self-pay | Admitting: Internal Medicine

## 2017-11-22 DIAGNOSIS — K625 Hemorrhage of anus and rectum: Secondary | ICD-10-CM

## 2017-11-22 MED ORDER — HYDROCORTISONE 2.5 % RE CREA: 1.0000 "application " | TOPICAL_CREAM | Freq: Two times a day (BID) | RECTAL | 1 refills | Status: DC

## 2017-11-22 NOTE — Telephone Encounter (Signed)
err

## 2017-12-06 ENCOUNTER — Encounter (INDEPENDENT_AMBULATORY_CARE_PROVIDER_SITE_OTHER): Payer: Self-pay | Admitting: Internal Medicine

## 2017-12-06 ENCOUNTER — Ambulatory Visit (INDEPENDENT_AMBULATORY_CARE_PROVIDER_SITE_OTHER): Payer: Medicare Other | Admitting: Internal Medicine

## 2017-12-06 ENCOUNTER — Encounter (INDEPENDENT_AMBULATORY_CARE_PROVIDER_SITE_OTHER): Payer: Self-pay | Admitting: *Deleted

## 2017-12-06 VITALS — BP 132/70 | HR 76 | Temp 98.1°F | Ht 66.0 in | Wt 153.8 lb

## 2017-12-06 DIAGNOSIS — K625 Hemorrhage of anus and rectum: Secondary | ICD-10-CM | POA: Diagnosis not present

## 2017-12-06 LAB — CBC
HCT: 25 % — ABNORMAL LOW (ref 38.5–50.0)
HEMOGLOBIN: 9 g/dL — AB (ref 13.2–17.1)
MCH: 33.8 pg — ABNORMAL HIGH (ref 27.0–33.0)
MCHC: 36 g/dL (ref 32.0–36.0)
MCV: 94 fL (ref 80.0–100.0)
MPV: 10.6 fL (ref 7.5–12.5)
Platelets: 138 10*3/uL — ABNORMAL LOW (ref 140–400)
RBC: 2.66 10*6/uL — ABNORMAL LOW (ref 4.20–5.80)
RDW: 12.1 % (ref 11.0–15.0)
WBC: 6.1 10*3/uL (ref 3.8–10.8)

## 2017-12-06 NOTE — Patient Instructions (Addendum)
Flex sigmoid. The risks of bleeding, perforation and infection were reviewed with patient. Use the Proctozone twice a day

## 2017-12-06 NOTE — Progress Notes (Signed)
   Subjective:    Patient ID: Clifford [name], male    DOB: 1951-10-12, 67 y.o.   MRN: [id number]  HPI Here today for f/u. Last seen 4 weeks ago. He says he is still having some rectal bleeding. He was prescribed Proctozone. He states he has been using the Proctofoam. He says he sometimes passes clots.  He feels okay. No weight loss.  He finished radiation in July or August of 2018 for prostate cancer.  Hx of Prostate cancer. He is followed by [facility] and his last Lupron injection was in August 2018. Wife states he will receive 2 more next year. Underwent 22 rounds of radiation and finished in August of this year in [place]. Continues to drink two 5ths a week of etoh.    He underwent a colonoscopy in November of 2017 which revealed:  - External and internal hemorrhoids. - No specimens collected. Please note patient's hgb is 9.8 g and platelet  count 177K Review of Systems Past Medical History:  Diagnosis Date  . Alcoholism ([place])   . Anemia   . High cholesterol   . Hypertension   . Prostate cancer ([place])   . Seizures ([place])     Past Surgical History:  Procedure Laterality Date  . CIRCUMCISION    . COLONOSCOPY N/A 09/13/2016   Procedure: COLONOSCOPY;  Surgeon: Rogene Houston, MD;  [facility];  Service: Endoscopy;  Laterality: N/A;  2:15  . NO PAST SURGERIES    . PROSTATE BIOPSY      Allergies  Allergen Reactions  . Lisinopril Swelling    Current Outpatient Medications on File Prior to Visit  Medication Sig Dispense Refill  . acetaminophen (TYLENOL) 500 MG tablet Take 500 mg by mouth every 6 (six) hours as needed (for pain.).    Marland Kitchen amLODipine (NORVASC) 10 MG tablet Take 10 mg by mouth daily.    . hydrocortisone (ANUSOL-HC) 2.5 % rectal cream Place 1 application rectally 2 (two) times daily. 30 g 1  . loratadine (CLARITIN) 10 MG tablet Take 10 mg by  mouth daily.    . Multiple Vitamin (MULTIVITAMIN WITH MINERALS) TABS tablet Take 1 tablet by mouth daily.    . pantoprazole (PROTONIX) 40 MG tablet Take 40 mg by mouth daily.    . hydrocortisone-pramoxine (PROCTOFOAM HC) rectal foam Place 1 applicator rectally 2 (two) times daily. (Patient not taking: Reported on 12/06/2017) 10 g 0   No current facility-administered medications on file prior to visit.         Objective:   Physical Exam Blood pressure 132/70, pulse 76, temperature 98.1 F (36.7 C), height 5\' 6"  (1.676 m), weight 153 lb 12.8 oz (69.8 kg). Alert and oriented. Skin warm and dry. Oral mucosa is moist.   . Sclera anicteric, conjunctivae is pink. Thyroid not enlarged. No cervical lymphadenopathy. Lungs clear. Heart regular rate and rhythm.  Abdomen is soft. Bowel sounds are positive. No hepatomegaly. No abdominal masses felt. No tenderness.  No edema to lower extremities. Patient is alert and oriented.        Assessment & Plan:  Rectal bleeding. Will schedule a flexible sigmoidoscopy CBC today.

## 2018-01-10 ENCOUNTER — Encounter (HOSPITAL_COMMUNITY)
Admission: RE | Disposition: A | Discharge status: Home / Self Care | Payer: Self-pay | Source: Ambulatory Visit | Attending: Internal Medicine

## 2018-01-10 ENCOUNTER — Other Ambulatory Visit: Payer: Self-pay

## 2018-01-10 ENCOUNTER — Encounter (HOSPITAL_COMMUNITY): Payer: Self-pay | Admitting: *Deleted

## 2018-01-10 ENCOUNTER — Ambulatory Visit (HOSPITAL_COMMUNITY)
Admission: RE | Admit: 2018-01-10 | Discharge: 2018-01-10 | Disposition: A | Discharge status: Home / Self Care | Payer: Medicare Other | Source: Ambulatory Visit | Attending: Internal Medicine | Admitting: Internal Medicine

## 2018-01-10 DIAGNOSIS — D649 Anemia, unspecified: Secondary | ICD-10-CM | POA: Insufficient documentation

## 2018-01-10 DIAGNOSIS — C61 Malignant neoplasm of prostate: Secondary | ICD-10-CM | POA: Diagnosis not present

## 2018-01-10 DIAGNOSIS — K5730 Diverticulosis of large intestine without perforation or abscess without bleeding: Secondary | ICD-10-CM | POA: Insufficient documentation

## 2018-01-10 DIAGNOSIS — K6289 Other specified diseases of anus and rectum: Secondary | ICD-10-CM | POA: Insufficient documentation

## 2018-01-10 DIAGNOSIS — Y842 Radiological procedure and radiotherapy as the cause of abnormal reaction of the patient, or of later complication, without mention of misadventure at the time of the procedure: Secondary | ICD-10-CM | POA: Diagnosis not present

## 2018-01-10 DIAGNOSIS — Z888 Allergy status to other drugs, medicaments and biological substances status: Secondary | ICD-10-CM | POA: Diagnosis not present

## 2018-01-10 DIAGNOSIS — K921 Melena: Secondary | ICD-10-CM

## 2018-01-10 DIAGNOSIS — Z79899 Other long term (current) drug therapy: Secondary | ICD-10-CM | POA: Diagnosis not present

## 2018-01-10 DIAGNOSIS — K5521 Angiodysplasia of colon with hemorrhage: Secondary | ICD-10-CM

## 2018-01-10 DIAGNOSIS — I10 Essential (primary) hypertension: Secondary | ICD-10-CM | POA: Insufficient documentation

## 2018-01-10 DIAGNOSIS — K625 Hemorrhage of anus and rectum: Secondary | ICD-10-CM | POA: Insufficient documentation

## 2018-01-10 DIAGNOSIS — E7800 Pure hypercholesterolemia, unspecified: Secondary | ICD-10-CM | POA: Insufficient documentation

## 2018-01-10 DIAGNOSIS — K644 Residual hemorrhoidal skin tags: Secondary | ICD-10-CM | POA: Diagnosis not present

## 2018-01-10 HISTORY — PX: FLEXIBLE SIGMOIDOSCOPY: SHX5431

## 2018-01-10 SURGERY — SIGMOIDOSCOPY, FLEXIBLE
Anesthesia: Moderate Sedation

## 2018-01-10 MED ORDER — MEPERIDINE HCL 25 MG/ML IJ SOLN
INTRAMUSCULAR | Status: DC | PRN
  Administered 2018-01-10: 25 mg via INTRAVENOUS

## 2018-01-10 MED ORDER — MIDAZOLAM HCL 5 MG/5ML IJ SOLN
INTRAMUSCULAR | Status: DC | PRN
  Administered 2018-01-10: 2 mg via INTRAVENOUS

## 2018-01-10 MED ORDER — BENEFIBER DRINK MIX PO PACK: 4.0000 g | PACK | Freq: Every day | ORAL | Status: DC

## 2018-01-10 MED ORDER — MIDAZOLAM HCL 5 MG/5ML IJ SOLN
INTRAMUSCULAR | Status: AC
  Filled 2018-01-10: qty 10

## 2018-01-10 MED ORDER — STERILE WATER FOR IRRIGATION IR SOLN
Status: DC | PRN
  Administered 2018-01-10: 09:00:00

## 2018-01-10 MED ORDER — LIDOCAINE HCL 2 % EX GEL
CUTANEOUS | Status: DC | PRN
  Administered 2018-01-10: 1 via TOPICAL

## 2018-01-10 MED ORDER — SODIUM CHLORIDE 0.9 % IV SOLN
INTRAVENOUS | Status: DC
  Administered 2018-01-10: 08:00:00 via INTRAVENOUS

## 2018-01-10 MED ORDER — MEPERIDINE HCL 50 MG/ML IJ SOLN
INTRAMUSCULAR | Status: AC
  Filled 2018-01-10: qty 1

## 2018-01-10 MED ORDER — LIDOCAINE HCL 2 % EX GEL
CUTANEOUS | Status: AC
  Filled 2018-01-10: qty 30

## 2018-01-10 NOTE — H&P (Signed)
Clifford [name] is an 67 y.o. male.   Chief Complaint: Patient is here for sigmoidoscopy. HPI: Patient is 67 year old Afro-American male who presents with recurrent hematochezia.  He says it occurs in cycles.  He week or 2 without bleeding.  Bleeding always occurs with bowel movements.  He denies diarrhea and/or constipation.  He describes the blood to be moderate to large amount.  He noted some yesterday when he took fleets enemas and also this morning.  He denies abdominal pain.  He had colonoscopy November 2017 revealing internal and external hemorrhoids. History is significant for continued excessive alcohol intake.  Recent CBC pertinent for hemoglobin of 9 g and platelet count of 138K.  He does not take aspirin or other OTC NSAIDs.  Past Medical History:  Diagnosis Date  . Alcoholism ([place])   . Anemia   . High cholesterol   . Hypertension   . Prostate cancer ([place])   . Seizures ([place])     Past Surgical History:  Procedure Laterality Date  . CIRCUMCISION    . COLONOSCOPY N/A 09/13/2016   Procedure: COLONOSCOPY;  Surgeon: Rogene Houston, MD;  [facility];  Service: Endoscopy;  Laterality: N/A;  2:15  . NO PAST SURGERIES    . PROSTATE BIOPSY      Family History  Problem Relation Age of Onset  . Alzheimer's disease Mother   . Hypertension Mother   . Hypertension Father   . Stroke Father   . Prostate cancer Father   . Hypertension Brother    Social History:  reports that  has never smoked. he has never used smokeless tobacco. He reports that he drinks alcohol. He reports that he does not use drugs.  Allergies:  Allergies  Allergen Reactions  . Lisinopril Swelling    Medications Prior to Admission  Medication Sig Dispense Refill  . amLODipine (NORVASC) 10 MG tablet Take 10 mg by mouth daily.    . Multiple Vitamin (MULTIVITAMIN WITH MINERALS) TABS tablet Take 1 tablet by mouth daily.    . pantoprazole (PROTONIX) 40 MG tablet Take 40 mg by mouth daily.    Marland Kitchen  acetaminophen (TYLENOL) 500 MG tablet Take 500 mg by mouth every 6 (six) hours as needed for moderate pain or headache.     . hydrocortisone (ANUSOL-HC) 2.5 % rectal cream Place 1 application rectally 2 (two) times daily. 30 g 1  . hydrocortisone-pramoxine (PROCTOFOAM HC) rectal foam Place 1 applicator rectally 2 (two) times daily. (Patient not taking: Reported on 12/06/2017) 10 g 0  . loratadine (CLARITIN) 10 MG tablet Take 10 mg by mouth daily.      No results found for this or any previous visit (from the past 48 hour(s)). No results found.  ROS  Blood pressure 140/80, pulse 92, temperature 98 F (36.7 C), temperature source Oral, height 5\' 6"  (1.676 m), weight 153 lb (69.4 kg), SpO2 100 %. Physical Exam  Constitutional: He appears well-developed and well-nourished.  HENT:  Mouth/Throat: Oropharynx is clear and moist.  Eyes: Conjunctivae are normal. No scleral icterus.  Neck: No thyromegaly present.  Cardiovascular: Normal rate, regular rhythm and normal heart sounds.  No murmur heard. Respiratory: Effort normal and breath sounds normal.  GI:  Abdomen is full but soft and nontender without organomegaly or masses.  Lymphadenopathy:    He has no cervical adenopathy.  Neurological: He is alert.  Patient responds appropriately to all questions.  Skin: Skin is warm and dry.     Assessment/Plan Recurrent hematochezia.  Diagnostic flexible sigmoidoscopy.  Hildred Laser, MD 01/10/2018, 9:14 AM

## 2018-01-10 NOTE — Op Note (Addendum)
[facility] Patient Name: Clifford [name] Procedure Date: 01/10/2018 8:48 AM MRN: [id number] Date of Birth: 03-09-1951 Attending MD: Hildred Laser , MD CSN: [id number] Age: 67 Admit Type: Outpatient Procedure:                Flexible Sigmoidoscopy Indications:              Hematochezia Providers:                Hildred Laser, MD, Jeanann Lewandowsky. Sharon Seller, RN, Aram Candela Referring MD:             Burman Freestone, FNP Medicines:                Lidocaine jelly to anal canal, Meperidine 25 mg IV,                            Midazolam 2 mg IV Complications:            No immediate complications. Estimated Blood Loss:     Estimated blood loss: none. Procedure:                Pre-Anesthesia Assessment:                           - Prior to the procedure, a History and Physical                            was performed, and patient medications and                            allergies were reviewed. The patient's tolerance of                            previous anesthesia was also reviewed. The risks                            and benefits of the procedure and the sedation                            options and risks were discussed with the patient.                            All questions were answered, and informed consent                            was obtained. Prior Anticoagulants: The patient has                            taken no previous anticoagulant or antiplatelet                            agents. ASA Grade Assessment: III - A patient with  severe systemic disease. After reviewing the risks                            and benefits, the patient was deemed in                            satisfactory condition to undergo the procedure.                           After obtaining informed consent, the scope was                            passed under direct vision. The [id number]                            ([id number]) scope was introduced  through the anus and                            advanced to the the sigmoid colon. The flexible                            sigmoidoscopy was accomplished without difficulty.                            The patient tolerated the procedure well. The                            quality of the bowel preparation was good. Scope In: 9:22:27 AM Scope Out: 9:40:22 AM Total Procedure Duration: 0 hours 17 minutes 55 seconds  Findings:      The digital rectal exam findings include decreased sphincter tone.      A few small-mouthed diverticula were found in the sigmoid colon.      Multiple angiodysplastic lesions with bleeding were found in the distal       rectum. Bleeding sites treated with hemospray with immediate cessation       of bleeding.      External hemorrhoids were found during retroflexion. The hemorrhoids       were small. Impression:               - Decreased sphincter tone found on digital rectal                            exam.                           - Diverticulosis in the sigmoid colon.                           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                           - Bleeding sites treated with hemospray with  immediate cessation of bleeding.                           - External hemorrhoids.                           - No specimens collected. Moderate Sedation:      Moderate (conscious) sedation was administered by the endoscopy nurse       and supervised by the endoscopist. The following parameters were       monitored: oxygen saturation, heart rate, blood pressure, CO2       capnography and response to care. Total physician intraservice time was       19 minutes. Recommendation:           - Discharge patient to home (with spouse).                           - Continue present medications.                           - Resume previous diet today.                            - Benefiber 4 g po qhs.                           - OV in one month. Procedure Code(s):        --- Professional ---                           [phone], Sigmoidoscopy, flexible; diagnostic,                            including collection of specimen(s) by brushing or                            washing, when performed (separate procedure)                           99152, Moderate sedation services provided by the                            same physician or other qualified health care                            professional performing the diagnostic or                            therapeutic service that the sedation supports,                            requiring the presence of an independent trained                            observer to assist in the monitoring of the  patient's level of consciousness and physiological                            status; initial 15 minutes of intraservice time,                            patient age 5 years or older Diagnosis Code(s):        --- Professional ---                           K62.89, Other specified diseases of anus and rectum                           K64.4, Residual hemorrhoidal skin tags                           K55.21, Angiodysplasia of colon with hemorrhage                           K92.1, Melena (includes Hematochezia)                           K57.30, Diverticulosis of large intestine without                            perforation or abscess without bleeding CPT copyright 2016 American Medical Association. All rights reserved. The codes documented in this report are preliminary and upon coder review may  be revised to meet current compliance requirements. Hildred Laser, MD Hildred Laser, MD 01/10/2018 9:57:47 AM This report has been signed electronically. Number of Addenda: 0

## 2018-01-10 NOTE — OR Nursing (Signed)
Dr. Laural Golden notified of patient stating he drank a sip of gin this morning prior to arrival. No orders received.

## 2018-01-10 NOTE — Discharge Instructions (Signed)
No aspirin or NSAIDs for 1 week. Keep Tylenol use to minimum. Resume other medications and diet as before. Benefiber 4 g p.o. Nightly. No driving for 24 hours. Keep symptom diary as to frequency of bleeding episodes until office visit in 1 month.    Colonoscopy, Adult, Care After This sheet gives you information about how to care for yourself after your procedure. Your doctor may also give you more specific instructions. If you have problems or questions, call your doctor. Follow these instructions at home: General instructions   For the first 24 hours after the procedure: ? Do not drive or use machinery. ? Do not sign important documents. ? Do not drink alcohol. ? Do your daily activities more slowly than normal. ? Eat foods that are soft and easy to digest. ? Rest often.  Take over-the-counter or prescription medicines only as told by your doctor.  It is up to you to get the results of your procedure. Ask your doctor, or the department performing the procedure, when your results will be ready. To help cramping and bloating:  Try walking around.  Put heat on your belly (abdomen) as told by your doctor. Use a heat source that your doctor recommends, such as a moist heat pack or a heating pad. ? Put a towel between your skin and the heat source. ? Leave the heat on for 20-30 minutes. ? Remove the heat if your skin turns bright red. This is especially important if you cannot feel pain, heat, or cold. You can get burned. Eating and drinking  Drink enough fluid to keep your pee (urine) clear or pale yellow.  Return to your normal diet as told by your doctor. Avoid heavy or fried foods that are hard to digest.  Avoid drinking alcohol for as long as told by your doctor. Contact a doctor if:  You have blood in your poop (stool) 2-3 days after the procedure. Get help right away if:  You have more than a small amount of blood in your poop.  You see large clumps of tissue (blood  clots) in your poop.  Your belly is swollen.  You feel sick to your stomach (nauseous).  You throw up (vomit).  You have a fever.  You have belly pain that gets worse, and medicine does not help your pain. This information is not intended to replace advice given to you by your health care provider. Make sure you discuss any questions you have with your health care provider. Document Released: 12/02/2010 Document Revised: 07/24/2016 Document Reviewed: 07/24/2016 Elsevier Interactive Patient Education  2017 Elsevier Inc.  Diverticulosis Diverticulosis is a condition that develops when small pouches (diverticula) form in the wall of the large intestine (colon). The colon is where water is absorbed and stool is formed. The pouches form when the inside layer of the colon pushes through weak spots in the outer layers of the colon. You may have a few pouches or many of them. What are the causes? The cause of this condition is not known. What increases the risk? The following factors may make you more likely to develop this condition:  Being older than age 34. Your risk for this condition increases with age. Diverticulosis is rare among people younger than age 61. By age 77, many people have it.  Eating a low-fiber diet.  Having frequent constipation.  Being overweight.  Not getting enough exercise.  Smoking.  Taking over-the-counter pain medicines, like aspirin and ibuprofen.  Having a family history of  diverticulosis.  What are the signs or symptoms? In most people, there are no symptoms of this condition. If you do have symptoms, they may include:  Bloating.  Cramps in the abdomen.  Constipation or diarrhea.  Pain in the lower left side of the abdomen.  How is this diagnosed? This condition is most often diagnosed during an exam for other colon problems. Because diverticulosis usually has no symptoms, it often cannot be diagnosed independently. This condition may be  diagnosed by:  Using a flexible scope to examine the colon (colonoscopy).  Taking an X-ray of the colon after dye has been put into the colon (barium enema).  Doing a CT scan.  How is this treated? You may not need treatment for this condition if you have never developed an infection related to diverticulosis. If you have had an infection before, treatment may include:  Eating a high-fiber diet. This may include eating more fruits, vegetables, and grains.  Taking a fiber supplement.  Taking a live bacteria supplement (probiotic).  Taking medicine to relax your colon.  Taking antibiotic medicines.  Follow these instructions at home:  Drink 6-8 glasses of water or more each day to prevent constipation.  Try not to strain when you have a bowel movement.  If you have had an infection before: ? Eat more fiber as directed by your health care provider or your diet and nutrition specialist (dietitian). ? Take a fiber supplement or probiotic, if your health care provider approves.  Take over-the-counter and prescription medicines only as told by your health care provider.  If you were prescribed an antibiotic, take it as told by your health care provider. Do not stop taking the antibiotic even if you start to feel better.  Keep all follow-up visits as told by your health care provider. This is important. Contact a health care provider if:  You have pain in your abdomen.  You have bloating.  You have cramps.  You have not had a bowel movement in 3 days. Get help right away if:  Your pain gets worse.  Your bloating becomes very bad.  You have a fever or chills, and your symptoms suddenly get worse.  You vomit.  You have bowel movements that are bloody or black.  You have bleeding from your rectum. Summary  Diverticulosis is a condition that develops when small pouches (diverticula) form in the wall of the large intestine (colon).  You may have a few pouches or many  of them.  This condition is most often diagnosed during an exam for other colon problems.  If you have had an infection related to diverticulosis, treatment may include increasing the fiber in your diet, taking supplements, or taking medicines. This information is not intended to replace advice given to you by your health care provider. Make sure you discuss any questions you have with your health care provider. Document Released: 07/27/2004 Document Revised: 09/18/2016 Document Reviewed: 09/18/2016 Elsevier Interactive Patient Education  2017 [employer].

## 2018-01-14 ENCOUNTER — Encounter (HOSPITAL_COMMUNITY): Payer: Self-pay | Admitting: Internal Medicine

## 2018-02-07 ENCOUNTER — Other Ambulatory Visit (INDEPENDENT_AMBULATORY_CARE_PROVIDER_SITE_OTHER): Payer: Self-pay | Admitting: *Deleted

## 2018-02-07 ENCOUNTER — Encounter (INDEPENDENT_AMBULATORY_CARE_PROVIDER_SITE_OTHER): Payer: Self-pay | Admitting: *Deleted

## 2018-02-07 ENCOUNTER — Ambulatory Visit (INDEPENDENT_AMBULATORY_CARE_PROVIDER_SITE_OTHER): Payer: Medicare Other | Admitting: Internal Medicine

## 2018-02-07 ENCOUNTER — Encounter (INDEPENDENT_AMBULATORY_CARE_PROVIDER_SITE_OTHER): Payer: Self-pay | Admitting: Internal Medicine

## 2018-02-07 VITALS — BP 130/70 | HR 72 | Temp 98.9°F | Ht 66.0 in | Wt 151.3 lb

## 2018-02-07 DIAGNOSIS — K625 Hemorrhage of anus and rectum: Secondary | ICD-10-CM

## 2018-02-07 LAB — HEMOGLOBIN AND HEMATOCRIT, BLOOD
HEMATOCRIT: 24.6 % — AB (ref 38.5–50.0)
HEMOGLOBIN: 8.1 g/dL — AB (ref 13.2–17.1)

## 2018-02-07 NOTE — Patient Instructions (Signed)
Cbc today.  OV in 1 year.

## 2018-02-07 NOTE — Progress Notes (Addendum)
Subjective:    Patient ID: Clifford [name], male    DOB: [birth year], 67 y.o.   MRN: [id number]  HPI Here today for f/u. Underwent a flex sigmoid 01/10/2018 for rectal bleeding. Found to have diverticulosis, distal rectal active bleeding from multiple sites, hemospray utilized to control bleeding areas. He tells me he is doing good. He has a very small amt of rectal bleeding at times with his BMs. Appetite is good. Has lost about 4 pounds since his OV in January. He continues to drinks about a 5th a week.  BMs every other day.   09/13/2016 Colonoscopy: screening: normal.     Hx of Prostate cancer. He is followed by [facility] and his last Lupron injection was in August 2018. Wife states he will receive 1 more injection this year.   Underwent 22 rounds of radiation and finished in August of last  year in [place].  CBC    Component Value Date/Time   WBC 6.1 12/06/2017 1140   RBC 2.66 (L) 12/06/2017 1140   HGB 9.0 (L) 12/06/2017 1140   HCT 25.0 (L) 12/06/2017 1140   PLT 138 (L) 12/06/2017 1140   MCV 94.0 12/06/2017 1140   MCH 33.8 (H) 12/06/2017 1140   MCHC 36.0 12/06/2017 1140   RDW 12.1 12/06/2017 1140     Review of Systems Past Medical History:  Diagnosis Date  . Alcoholism ([place])   . Anemia   . High cholesterol   . Hypertension   . Prostate cancer ([place])   . Seizures ([place])     Past Surgical History:  Procedure Laterality Date  . CIRCUMCISION    . COLONOSCOPY N/A 09/13/2016   Procedure: COLONOSCOPY;  Surgeon: Rogene Houston, MD;  Location[facility];  Service: Endoscopy;  Laterality: N/A;  2:15  . FLEXIBLE SIGMOIDOSCOPY N/A 01/10/2018   Procedure: FLEXIBLE SIGMOIDOSCOPY;  Surgeon: Rogene Houston, MD;  Location: [facility];  Service: Endoscopy;  Laterality: N/A;  1:55  . NO PAST SURGERIES    . PROSTATE BIOPSY      Allergies  Allergen Reactions  . Lisinopril Swelling    Current Outpatient Medications on File Prior to Visit  Medication Sig Dispense  Refill  . acetaminophen (TYLENOL) 500 MG tablet Take 500 mg by mouth every 6 (six) hours as needed for moderate pain or headache.     Marland Kitchen amLODipine (NORVASC) 10 MG tablet Take 10 mg by mouth daily.    Marland Kitchen loratadine (CLARITIN) 10 MG tablet Take 10 mg by mouth daily.    . Multiple Vitamin (MULTIVITAMIN WITH MINERALS) TABS tablet Take 1 tablet by mouth daily.    . pantoprazole (PROTONIX) 40 MG tablet Take 40 mg by mouth daily.    . Wheat Dextrin (BENEFIBER DRINK MIX) PACK Take 4 g by mouth at bedtime.     No current facility-administered medications on file prior to visit.         Objective:   Physical Exam Blood pressure 130/70, pulse 72, temperature 98.9 F (37.2 C), height 5\' 6"  (1.676 m), weight 151 lb 4.8 oz (68.6 kg). Alert and oriented. Skin warm and dry. Oral mucosa is moist.   . Sclera anicteric, conjunctivae is pink. Thyroid not enlarged. No cervical lymphadenopathy. Lungs clear. Heart regular rate and rhythm.  Abdomen is soft. Bowel sounds are positive. No hepatomegaly. No abdominal masses felt. No tenderness.  No edema to lower extremities.           Assessment & Plan:  Rectal  bleeding. Flex  Sigmoidoscopy revealed angiodysplastic lesions which were hemosprayed.  Will get an H and H today. OV  In 1 year.

## 2018-02-21 LAB — HEMOGLOBIN AND HEMATOCRIT, BLOOD
HEMATOCRIT: 24.6 % — AB (ref 38.5–50.0)
Hemoglobin: 8.1 g/dL — ABNORMAL LOW (ref 13.2–17.1)

## 2018-02-25 ENCOUNTER — Other Ambulatory Visit (INDEPENDENT_AMBULATORY_CARE_PROVIDER_SITE_OTHER): Payer: Self-pay | Admitting: *Deleted

## 2018-02-25 ENCOUNTER — Telehealth (INDEPENDENT_AMBULATORY_CARE_PROVIDER_SITE_OTHER): Payer: Self-pay | Admitting: Internal Medicine

## 2018-02-25 DIAGNOSIS — D638 Anemia in other chronic diseases classified elsewhere: Secondary | ICD-10-CM

## 2018-02-25 NOTE — Telephone Encounter (Signed)
Tammy, H and H in 4 weeks. Please send letter

## 2018-02-25 NOTE — Telephone Encounter (Signed)
H&H noted for 4 weeks and a letter will be sent as a reminder.

## 2018-03-07 ENCOUNTER — Other Ambulatory Visit (INDEPENDENT_AMBULATORY_CARE_PROVIDER_SITE_OTHER): Payer: Self-pay | Admitting: *Deleted

## 2018-03-07 ENCOUNTER — Encounter (INDEPENDENT_AMBULATORY_CARE_PROVIDER_SITE_OTHER): Payer: Self-pay | Admitting: *Deleted

## 2018-03-07 DIAGNOSIS — D649 Anemia, unspecified: Secondary | ICD-10-CM

## 2018-03-07 DIAGNOSIS — D638 Anemia in other chronic diseases classified elsewhere: Secondary | ICD-10-CM

## 2018-04-04 LAB — HEMOGLOBIN AND HEMATOCRIT, BLOOD
HEMATOCRIT: 20.7 % — AB (ref 38.5–50.0)
HEMOGLOBIN: 6.9 g/dL — AB (ref 13.2–17.1)

## 2018-04-05 ENCOUNTER — Encounter (HOSPITAL_COMMUNITY): Payer: Self-pay | Admitting: Emergency Medicine

## 2018-04-05 ENCOUNTER — Emergency Department (HOSPITAL_COMMUNITY): Payer: Medicare Other

## 2018-04-05 ENCOUNTER — Inpatient Hospital Stay (HOSPITAL_COMMUNITY)
Admission: EM | Admit: 2018-04-05 | Discharge: 2018-04-09 | DRG: 347 | Disposition: A | Discharge status: Home / Self Care | Payer: Medicare Other | Attending: Internal Medicine | Admitting: Internal Medicine

## 2018-04-05 ENCOUNTER — Other Ambulatory Visit: Payer: Self-pay

## 2018-04-05 DIAGNOSIS — K644 Residual hemorrhoidal skin tags: Secondary | ICD-10-CM | POA: Diagnosis present

## 2018-04-05 DIAGNOSIS — K922 Gastrointestinal hemorrhage, unspecified: Secondary | ICD-10-CM | POA: Diagnosis present

## 2018-04-05 DIAGNOSIS — D62 Acute posthemorrhagic anemia: Secondary | ICD-10-CM | POA: Diagnosis not present

## 2018-04-05 DIAGNOSIS — I781 Nevus, non-neoplastic: Secondary | ICD-10-CM | POA: Diagnosis present

## 2018-04-05 DIAGNOSIS — K921 Melena: Secondary | ICD-10-CM | POA: Diagnosis not present

## 2018-04-05 DIAGNOSIS — D125 Benign neoplasm of sigmoid colon: Secondary | ICD-10-CM | POA: Diagnosis present

## 2018-04-05 DIAGNOSIS — E876 Hypokalemia: Secondary | ICD-10-CM | POA: Diagnosis present

## 2018-04-05 DIAGNOSIS — K219 Gastro-esophageal reflux disease without esophagitis: Secondary | ICD-10-CM | POA: Diagnosis present

## 2018-04-05 DIAGNOSIS — K648 Other hemorrhoids: Principal | ICD-10-CM

## 2018-04-05 DIAGNOSIS — F1020 Alcohol dependence, uncomplicated: Secondary | ICD-10-CM | POA: Diagnosis present

## 2018-04-05 DIAGNOSIS — Z79899 Other long term (current) drug therapy: Secondary | ICD-10-CM

## 2018-04-05 DIAGNOSIS — E785 Hyperlipidemia, unspecified: Secondary | ICD-10-CM | POA: Diagnosis present

## 2018-04-05 DIAGNOSIS — R509 Fever, unspecified: Secondary | ICD-10-CM

## 2018-04-05 DIAGNOSIS — I10 Essential (primary) hypertension: Secondary | ICD-10-CM | POA: Diagnosis present

## 2018-04-05 DIAGNOSIS — K649 Unspecified hemorrhoids: Secondary | ICD-10-CM

## 2018-04-05 DIAGNOSIS — D696 Thrombocytopenia, unspecified: Secondary | ICD-10-CM

## 2018-04-05 DIAGNOSIS — D509 Iron deficiency anemia, unspecified: Secondary | ICD-10-CM | POA: Diagnosis present

## 2018-04-05 DIAGNOSIS — Z8249 Family history of ischemic heart disease and other diseases of the circulatory system: Secondary | ICD-10-CM

## 2018-04-05 DIAGNOSIS — Z888 Allergy status to other drugs, medicaments and biological substances status: Secondary | ICD-10-CM

## 2018-04-05 DIAGNOSIS — K5521 Angiodysplasia of colon with hemorrhage: Secondary | ICD-10-CM | POA: Diagnosis present

## 2018-04-05 DIAGNOSIS — Z8042 Family history of malignant neoplasm of prostate: Secondary | ICD-10-CM

## 2018-04-05 DIAGNOSIS — Z8546 Personal history of malignant neoplasm of prostate: Secondary | ICD-10-CM

## 2018-04-05 DIAGNOSIS — C61 Malignant neoplasm of prostate: Secondary | ICD-10-CM | POA: Diagnosis not present

## 2018-04-05 DIAGNOSIS — K760 Fatty (change of) liver, not elsewhere classified: Secondary | ICD-10-CM | POA: Diagnosis present

## 2018-04-05 DIAGNOSIS — K759 Inflammatory liver disease, unspecified: Secondary | ICD-10-CM | POA: Diagnosis present

## 2018-04-05 DIAGNOSIS — Z66 Do not resuscitate: Secondary | ICD-10-CM | POA: Diagnosis present

## 2018-04-05 DIAGNOSIS — Z923 Personal history of irradiation: Secondary | ICD-10-CM

## 2018-04-05 DIAGNOSIS — G40909 Epilepsy, unspecified, not intractable, without status epilepticus: Secondary | ICD-10-CM | POA: Diagnosis present

## 2018-04-05 DIAGNOSIS — K5730 Diverticulosis of large intestine without perforation or abscess without bleeding: Secondary | ICD-10-CM | POA: Diagnosis present

## 2018-04-05 DIAGNOSIS — D6959 Other secondary thrombocytopenia: Secondary | ICD-10-CM | POA: Diagnosis present

## 2018-04-05 LAB — HEPATIC FUNCTION PANEL
ALBUMIN: 3.7 g/dL (ref 3.5–5.0)
ALK PHOS: 64 U/L (ref 38–126)
ALT: 32 U/L (ref 17–63)
AST: 110 U/L — ABNORMAL HIGH (ref 15–41)
BILIRUBIN TOTAL: 0.9 mg/dL (ref 0.3–1.2)
Bilirubin, Direct: 0.2 mg/dL (ref 0.1–0.5)
Indirect Bilirubin: 0.7 mg/dL (ref 0.3–0.9)
Total Protein: 7.1 g/dL (ref 6.5–8.1)

## 2018-04-05 LAB — BASIC METABOLIC PANEL
Anion gap: 15 (ref 5–15)
BUN: 14 mg/dL (ref 6–20)
CHLORIDE: 96 mmol/L — AB (ref 101–111)
CO2: 20 mmol/L — AB (ref 22–32)
Calcium: 8.1 mg/dL — ABNORMAL LOW (ref 8.9–10.3)
Creatinine, Ser: 1.38 mg/dL — ABNORMAL HIGH (ref 0.61–1.24)
GFR calc Af Amer: 60 mL/min — ABNORMAL LOW (ref >60)
GFR calc non Af Amer: 51 mL/min — ABNORMAL LOW (ref >60)
GLUCOSE: 95 mg/dL (ref 65–99)
POTASSIUM: 4.2 mmol/L (ref 3.5–5.1)
Sodium: 131 mmol/L — ABNORMAL LOW (ref 135–145)

## 2018-04-05 LAB — CBC WITH DIFFERENTIAL/PLATELET
Basophils Absolute: 0.1 10*3/uL (ref 0.0–0.1)
Basophils Relative: 1 %
Eosinophils Absolute: 0 10*3/uL (ref 0.0–0.7)
Eosinophils Relative: 1 %
HEMATOCRIT: 22.9 % — AB (ref 39.0–52.0)
HEMOGLOBIN: 7.1 g/dL — AB (ref 13.0–17.0)
LYMPHS ABS: 1 10*3/uL (ref 0.7–4.0)
LYMPHS PCT: 17 %
MCH: 26.4 pg (ref 26.0–34.0)
MCHC: 31 g/dL (ref 30.0–36.0)
MCV: 85.1 fL (ref 78.0–100.0)
MONOS PCT: 22 %
Monocytes Absolute: 1.2 10*3/uL — ABNORMAL HIGH (ref 0.1–1.0)
NEUTROS PCT: 59 %
Neutro Abs: 3.4 10*3/uL (ref 1.7–7.7)
Platelets: 113 10*3/uL — ABNORMAL LOW (ref 150–400)
RBC: 2.69 MIL/uL — AB (ref 4.22–5.81)
RDW: 15 % (ref 11.5–15.5)
WBC: 5.6 10*3/uL (ref 4.0–10.5)

## 2018-04-05 LAB — ABO/RH: ABO/RH(D): A POS

## 2018-04-05 LAB — POC OCCULT BLOOD, ED: FECAL OCCULT BLD: POSITIVE — AB

## 2018-04-05 LAB — PREPARE RBC (CROSSMATCH)

## 2018-04-05 LAB — MRSA PCR SCREENING: MRSA by PCR: NEGATIVE

## 2018-04-05 MED ORDER — ADULT MULTIVITAMIN W/MINERALS CH
1.0000 | ORAL_TABLET | Freq: Every day | ORAL | Status: DC
  Administered 2018-04-05 – 2018-04-09 (×5): 1 via ORAL
  Filled 2018-04-05 (×5): qty 1

## 2018-04-05 MED ORDER — LORAZEPAM 2 MG/ML IJ SOLN
2.0000 mg | INTRAMUSCULAR | Status: DC | PRN
  Administered 2018-04-06: 2 mg via INTRAVENOUS
  Filled 2018-04-05: qty 1

## 2018-04-05 MED ORDER — LORATADINE 10 MG PO TABS
10.0000 mg | ORAL_TABLET | Freq: Every day | ORAL | Status: DC
  Administered 2018-04-06 – 2018-04-09 (×4): 10 mg via ORAL
  Filled 2018-04-05 (×4): qty 1

## 2018-04-05 MED ORDER — SODIUM CHLORIDE 0.9 % IV SOLN
INTRAVENOUS | Status: DC
  Administered 2018-04-05 – 2018-04-07 (×2): via INTRAVENOUS

## 2018-04-05 MED ORDER — SODIUM CHLORIDE 0.9 % IV SOLN
INTRAVENOUS | Status: DC
  Administered 2018-04-05 – 2018-04-07 (×3): via INTRAVENOUS

## 2018-04-05 MED ORDER — ACETAMINOPHEN 650 MG RE SUPP: 650.0000 mg | Freq: Four times a day (QID) | RECTAL | Status: DC | PRN

## 2018-04-05 MED ORDER — ACETAMINOPHEN 325 MG PO TABS: 650.0000 mg | ORAL_TABLET | Freq: Four times a day (QID) | ORAL | Status: DC | PRN

## 2018-04-05 MED ORDER — ONDANSETRON HCL 4 MG/2ML IJ SOLN: 4.0000 mg | Freq: Four times a day (QID) | INTRAMUSCULAR | Status: DC | PRN

## 2018-04-05 MED ORDER — PANTOPRAZOLE SODIUM 40 MG PO TBEC
40.0000 mg | DELAYED_RELEASE_TABLET | Freq: Every day | ORAL | Status: DC
  Administered 2018-04-06 – 2018-04-09 (×4): 40 mg via ORAL
  Filled 2018-04-05 (×4): qty 1

## 2018-04-05 MED ORDER — SODIUM CHLORIDE 0.9 % IV SOLN: 10.0000 mL/h | Freq: Once | INTRAVENOUS | Status: AC

## 2018-04-05 MED ORDER — ONDANSETRON HCL 4 MG PO TABS: 4.0000 mg | ORAL_TABLET | Freq: Four times a day (QID) | ORAL | Status: DC | PRN

## 2018-04-05 NOTE — ED Provider Notes (Signed)
[facility] EMERGENCY DEPARTMENT Provider Note   CSN: [id number] Arrival date & time: 04/05/18  1446     History   Chief Complaint Chief Complaint  Patient presents with  . Abnormal Lab    HPI Clifford [name] is a 67 y.o. male.  Patient referred in by Dr. Melony Overly his gastroenterologist.  Patient's had some recurrent rectal bleeding.  Started about a week ago was increased some here today.  Patient was admitted in February for rectal bleeding sigmoidoscopy at that time, showed diverticulosis with some diffuse bleeding.  Patient also blood count showed that his hemoglobin was 6.9.  Dr. Melony Overly aware of all this.  Patient upon arrival here without any complaints other than the GI bleeding.  No pain.     Past Medical History:  Diagnosis Date  . Alcoholism ([place])   . Anemia   . High cholesterol   . Hypertension   . Prostate cancer ([place])   . Seizures [facility])     Patient Active Problem List   Diagnosis Date Noted  . Rectal bleeding 12/06/2017  . Malignant neoplasm of prostate ([place]) 12/04/2016  . Anemia in other chronic diseases classified elsewhere 07/11/2016  . Alcoholism ([place]) 07/04/2016  . Seizures ([place]) 07/04/2016  . Hyperlipidemia 07/04/2016  . High blood pressure 07/04/2016    Past Surgical History:  Procedure Laterality Date  . CIRCUMCISION    . COLONOSCOPY N/A 09/13/2016   Procedure: COLONOSCOPY;  Surgeon: Rogene Houston, MD;  Location[facility];  Service: Endoscopy;  Laterality: N/A;  2:15  . FLEXIBLE SIGMOIDOSCOPY N/A 01/10/2018   Procedure: FLEXIBLE SIGMOIDOSCOPY;  Surgeon: Rogene Houston, MD;  Location[facility];  Service: Endoscopy;  Laterality: N/A;  1:55  . NO PAST SURGERIES    . PROSTATE BIOPSY          Home Medications    Prior to Admission medications   Medication Sig Start Date End Date Taking? Authorizing Provider  acetaminophen (TYLENOL) 500 MG tablet Take 500 mg by mouth every 6 (six) hours as needed for moderate pain or headache.      [provider]  amLODipine (NORVASC) 10 MG tablet Take 10 mg by mouth daily.    [provider]  loratadine (CLARITIN) 10 MG tablet Take 10 mg by mouth daily.    [provider]  Multiple Vitamin (MULTIVITAMIN WITH MINERALS) TABS tablet Take 1 tablet by mouth daily.    [provider]  pantoprazole (PROTONIX) 40 MG tablet Take 40 mg by mouth daily.    [provider]  Wheat Dextrin (BENEFIBER DRINK MIX) PACK Take 4 g by mouth at bedtime. 01/10/18   Rogene Houston, MD    Family History Family History  Problem Relation Age of Onset  . Alzheimer's disease Mother   . Hypertension Mother   . Hypertension Father   . Stroke Father   . Prostate cancer Father   . Hypertension Brother     Social History Social History   Tobacco Use  . Smoking status: Never Smoker  . Smokeless tobacco: Never Used  Substance Use Topics  . Alcohol use: Yes    Comment: 1-2 bottles of Gin every few days  . Drug use: No     Allergies   Lisinopril   Review of Systems Review of Systems  Constitutional: Negative for fever.  HENT: Negative for congestion.   Eyes: Negative for redness.  Respiratory: Negative for shortness of breath.   Cardiovascular: Negative for chest pain.  Gastrointestinal: Positive for anal bleeding and blood in stool. Negative for abdominal pain and vomiting.  Genitourinary: Negative for dysuria.  Musculoskeletal: Negative for back pain.  Skin: Negative for rash.  Neurological: Negative for headaches.  Hematological: Does not bruise/bleed easily.  Psychiatric/Behavioral: Negative for confusion.     Physical Exam Updated Vital Signs BP 113/64   Pulse 86   Temp 98.8 F (37.1 C) (Oral)   Resp 17   Ht 1.676 m (5\' 6" )   Wt 68 kg (150 lb)   SpO2 94%   BMI 24.21 kg/m   Physical Exam  Constitutional: He is oriented to person, place, and time. He appears well-developed and well-nourished. No distress.  HENT:  Head:  Normocephalic and atraumatic.  Mouth/Throat: Oropharynx is clear and moist.  Eyes: Pupils are equal, round, and reactive to light. Conjunctivae and EOM are normal.  Neck: Normal range of motion. Neck supple.  Cardiovascular: Normal rate, regular rhythm and normal heart sounds.  Pulmonary/Chest: Effort normal and breath sounds normal.  Abdominal: Soft. Bowel sounds are normal. There is no tenderness.  Genitourinary: Rectal exam shows guaiac positive stool.  Genitourinary Comments: Maroonred stool.  Musculoskeletal: Normal range of motion. He exhibits no edema.  Neurological: He is alert and oriented to person, place, and time. No cranial nerve deficit or sensory deficit. He exhibits normal muscle tone. Coordination normal.  Skin: Skin is warm.  Nursing note and vitals reviewed.    ED Treatments / Results  Labs (all labs ordered are listed, but only abnormal results are displayed) Labs Reviewed  CBC WITH DIFFERENTIAL/PLATELET - Abnormal; Notable for the following components:      Result Value   RBC 2.69 (*)    Hemoglobin 7.1 (*)    HCT 22.9 (*)    Platelets 113 (*)    Monocytes Absolute 1.2 (*)    All other components within normal limits  BASIC METABOLIC PANEL - Abnormal; Notable for the following components:   Sodium 131 (*)    Chloride 96 (*)    CO2 20 (*)    Creatinine, Ser 1.38 (*)    Calcium 8.1 (*)    GFR calc non Af Amer 51 (*)    GFR calc Af Amer 60 (*)    All other components within normal limits  HEPATIC FUNCTION PANEL - Abnormal; Notable for the following components:   AST 110 (*)    All other components within normal limits  POC OCCULT BLOOD, ED  TYPE AND SCREEN  PREPARE RBC (CROSSMATCH)    EKG EKG Interpretation  Date/Time:  Friday Apr 05 2018 15:36:30 EDT Ventricular Rate:  82 PR Interval:    QRS Duration: 97 QT Interval:  387 QTC Calculation: 452 R Axis:   46 Text Interpretation:  Sinus or ectopic atrial rhythm Low voltage, precordial leads no  previous Confirmed by Fredia Sorrow [phone]) on 04/05/2018 4:05:04 PM   Radiology Dg Chest 2 View  Result Date: 04/05/2018 CLINICAL DATA:  Cough and weakness x1 month EXAM: CHEST - 2 VIEW COMPARISON:  None. FINDINGS: AP upright view of the chest. Borderline cardiomegaly. Mild uncoiling of the thoracic aorta without aneurysm. Minimal bibasilar atelectasis. No pulmonary edema or pulmonary consolidation. No acute osseous abnormality. Mild degenerative change about both AC joints. IMPRESSION: No active cardiopulmonary disease. Electronically Signed   By: Ashley Royalty M.D.   On: 04/05/2018 17:04    Procedures Procedures (including critical care time)  CRITICAL CARE Performed by: Fredia Sorrow Total critical care time: 30 minutes Critical care  time was exclusive of separately billable procedures and treating other patients. Critical care was necessary to treat or prevent imminent or life-threatening deterioration. Critical care was time spent personally by me on the following activities: development of treatment plan with patient and/or surrogate as well as nursing, discussions with consultants, evaluation of patient's response to treatment, examination of patient, obtaining history from patient or surrogate, ordering and performing treatments and interventions, ordering and review of laboratory studies, ordering and review of radiographic studies, pulse oximetry and re-evaluation of patient's condition.    Medications Ordered in ED Medications  0.9 %  sodium chloride infusion ( Intravenous New Bag/Given 04/05/18 1648)  0.9 %  sodium chloride infusion (has no administration in time range)     Initial Impression / Assessment and Plan / ED Course  I have reviewed the triage vital signs and the nursing notes.  Pertinent labs & imaging results that were available during my care of the patient were reviewed by me and considered in my medical decision making (see chart for details).    Patient  with significant edema.  Patient with evidence of maroonish reddish rectal exam.  Probably diverticular type bleed.  Dr. Melony Overly patient's gastroenterologist aware of the admission.  Hospitalist will admit.  Hemodynamically patient is stable currently.  Due to his hemoglobin is 7 2 units of blood have been ordered for transfusion.   Final Clinical Impressions(s) / ED Diagnoses   Final diagnoses:  Gastrointestinal hemorrhage, unspecified gastrointestinal hemorrhage type    ED Discharge Orders    None       Fredia Sorrow, MD 04/05/18 [phone]

## 2018-04-05 NOTE — ED Triage Notes (Addendum)
Patient's spouse states he had blood drawn yesterday and was called today and told to come to ER for blood transfusion. Patient's HGB 6.9. Patient states he is currently having bright red blood from rectum. Complaining of generalized weakness x 1 month.

## 2018-04-05 NOTE — ED Notes (Signed)
Hospitalist at bedside 

## 2018-04-05 NOTE — H&P (Signed)
History and Physical  Clifford [name] WFU:[id number] DOB: 03/13/1951 DOA: 04/05/2018  Referring physician: Dr Rogene Houston, ED physician PCP: [name], FNP  Outpatient Specialists:  Laural Golden (GI)  Patient Coming From: home  Chief Complaint: anemia, rectal bleeding  HPI: Clifford [name] is a 67 y.o. male with a history of HTN, prostate cancer s/p radiation, diverticulosis, h/o lower GI bleeding secondary to angiodysplasia of colon about 3 months ago. Patient presents with 4-5 days of worsening hematochezia with a Hg of 6.9 checked yesterday by Dr Laural Golden.  Patient having mild dizziness and lightheadedness with ambulation.  Has had continued hematochezia.  No palliating or provoking factors.  Denies abdominal pain, nausea, vomiting, diarrhea.  Emergency Department Course: Rpt Hg 7.1. 2 units PRBC ordered.  Review of Systems:   Pt denies any fevers, chills, nausea, vomiting, diarrhea, constipation, abdominal pain, shortness of breath, dyspnea on exertion, orthopnea, cough, wheezing, palpitations, headache, vision changes, lightheadedness, dizziness, melena, rectal bleeding.  Review of systems are otherwise negative  Past Medical History:  Diagnosis Date  . Alcoholism ([place])   . Anemia   . High cholesterol   . Hypertension   . Prostate cancer ([place])   . Seizures ([place])    Past Surgical History:  Procedure Laterality Date  . CIRCUMCISION    . COLONOSCOPY N/A 09/13/2016   Procedure: COLONOSCOPY;  Surgeon: Rogene Houston, MD;  Location: [facility];  Service: Endoscopy;  Laterality: N/A;  2:15  . FLEXIBLE SIGMOIDOSCOPY N/A 01/10/2018   Procedure: FLEXIBLE SIGMOIDOSCOPY;  Surgeon: Rogene Houston, MD;  Location: [facility];  Service: Endoscopy;  Laterality: N/A;  1:55  . NO PAST SURGERIES    . PROSTATE BIOPSY     Social History:  reports that he has never smoked. He has never used smokeless tobacco. He reports that he drinks alcohol. He reports that he does not use  drugs. Patient lives at home  Allergies  Allergen Reactions  . Lisinopril Swelling    Family History  Problem Relation Age of Onset  . Alzheimer's disease Mother   . Hypertension Mother   . Hypertension Father   . Stroke Father   . Prostate cancer Father   . Hypertension Brother       Prior to Admission medications   Medication Sig Start Date End Date Taking? Authorizing Provider  acetaminophen (TYLENOL) 500 MG tablet Take 500 mg by mouth every 6 (six) hours as needed for moderate pain or headache.    Yes [provider]  amLODipine (NORVASC) 10 MG tablet Take 10 mg by mouth daily.   Yes [provider]  loratadine (CLARITIN) 10 MG tablet Take 10 mg by mouth daily.   Yes [provider]  Multiple Vitamin (MULTIVITAMIN WITH MINERALS) TABS tablet Take 1 tablet by mouth daily.   Yes [provider]  pantoprazole (PROTONIX) 40 MG tablet Take 40 mg by mouth daily.   Yes [provider]  Wheat Dextrin (BENEFIBER DRINK MIX) PACK Take 4 g by mouth at bedtime. 01/10/18  Yes Rogene Houston, MD    Physical Exam: BP 115/68   Pulse 96   Temp 98.8 F (37.1 C) (Oral)   Resp (!) 23   Ht 5\' 6"  (1.676 m)   Wt 68 kg (150 lb)   SpO2 98%   BMI 24.21 kg/m   . General: Black male. Awake and alert and oriented x3. No acute cardiopulmonary distress.  Marland Kitchen HEENT: Normocephalic atraumatic.  Right and left ears normal in  appearance.  Pupils equal, round, reactive to light. Extraocular muscles are intact. Sclerae anicteric and noninjected.  Moist mucosal membranes. No mucosal lesions.  . Neck: Neck supple without lymphadenopathy. No carotid bruits. No masses palpated.  . Cardiovascular: Regular rate with normal S1-S2 sounds. No murmurs, rubs, gallops auscultated. No JVD.  Marland Kitchen Respiratory: Good respiratory effort with no wheezes, rales, rhonchi. Lungs clear to auscultation bilaterally.  No accessory muscle use. . Abdomen: Soft, nontender, nondistended. Active  bowel sounds. No masses or hepatosplenomegaly  . Skin: No rashes, lesions, or ulcerations.  Dry, warm to touch. 2+ dorsalis pedis and radial pulses. . Musculoskeletal: No calf or leg pain. All major joints not erythematous nontender.  No upper or lower joint deformation.  Good ROM.  No contractures  . Psychiatric: Intact judgment and insight. Pleasant and cooperative. . Neurologic: No focal neurological deficits. Strength is 5/5 and symmetric in upper and lower extremities.  Cranial nerves II through XII are grossly intact.           Labs on Admission: I have personally reviewed following labs and imaging studies  CBC: Recent Labs  Lab 04/04/18 1104 04/05/18 1539  WBC  --  5.6  NEUTROABS  --  3.4  HGB 6.9* 7.1*  HCT 20.7* 22.9*  MCV  --  85.1  PLT  --  673*   Basic Metabolic Panel: Recent Labs  Lab 04/05/18 1539  NA 131*  K 4.2  CL 96*  CO2 20*  GLUCOSE 95  BUN 14  CREATININE 1.38*  CALCIUM 8.1*   GFR: Estimated Creatinine Clearance: 46.9 mL/min (A) (by C-G formula based on SCr of 1.38 mg/dL (H)). Liver Function Tests: Recent Labs  Lab 04/05/18 1605  AST 110*  ALT 32  ALKPHOS 64  BILITOT 0.9  PROT 7.1  ALBUMIN 3.7   No results for input(s): LIPASE, AMYLASE in the last 168 hours. No results for input(s): AMMONIA in the last 168 hours. Coagulation Profile: No results for input(s): INR, PROTIME in the last 168 hours. Cardiac Enzymes: No results for input(s): CKTOTAL, CKMB, CKMBINDEX, TROPONINI in the last 168 hours. BNP (last 3 results) No results for input(s): PROBNP in the last 8760 hours. HbA1C: No results for input(s): HGBA1C in the last 72 hours. CBG: No results for input(s): GLUCAP in the last 168 hours. Lipid Profile: No results for input(s): CHOL, HDL, LDLCALC, TRIG, CHOLHDL, LDLDIRECT in the last 72 hours. Thyroid Function Tests: No results for input(s): TSH, T4TOTAL, FREET4, T3FREE, THYROIDAB in the last 72 hours. Anemia Panel: No results for  input(s): VITAMINB12, FOLATE, FERRITIN, TIBC, IRON, RETICCTPCT in the last 72 hours. Urine analysis: No results found for: COLORURINE, APPEARANCEUR, LABSPEC, PHURINE, GLUCOSEU, HGBUR, BILIRUBINUR, KETONESUR, PROTEINUR, UROBILINOGEN, NITRITE, LEUKOCYTESUR Sepsis Labs: @LABRCNTIP (procalcitonin:4,lacticidven:4) )No results found for this or any previous visit (from the past 240 hour(s)).   Radiological Exams on Admission: Dg Chest 2 View  Result Date: 04/05/2018 CLINICAL DATA:  Cough and weakness x1 month EXAM: CHEST - 2 VIEW COMPARISON:  None. FINDINGS: AP upright view of the chest. Borderline cardiomegaly. Mild uncoiling of the thoracic aorta without aneurysm. Minimal bibasilar atelectasis. No pulmonary edema or pulmonary consolidation. No acute osseous abnormality. Mild degenerative change about both AC joints. IMPRESSION: No active cardiopulmonary disease. Electronically Signed   By: Ashley Royalty M.D.   On: 04/05/2018 17:04   Assessment/Plan: Principal Problem:   Acute blood loss anemia Active Problems:   Essential hypertension   Malignant neoplasm of prostate (HCC)   Acute lower GI  bleeding    This patient was discussed with the ED physician, including pertinent vitals, physical exam findings, labs, and imaging.  We also discussed care given by the ED provider.  1. Acute blood loss anemia a. Transfused 2 units b. Stepdown observation  c. GI consult tomorrow d. Clear liquids now, then n.p.o. after midnight 2. Acute lower GI bleeding a. As above 3. Malignant neoplasm of the prostate hypertension a. Stable 4. Hypertension a.  pressure little soft.  Will hold antihypertensives tomorrow  DVT prophylaxis: SCDs Consultants: GI Code Status: full Family Communication: wife in room during interview and exam  Disposition Plan: patient to return home tomorrow following stabilization of Rectal bleeding and stable hemoglobin   Truett Mainland, DO [facility] Pager  [phone]  If 7PM-7AM, please contact night-coverage [email or web address] Password [email or web address]

## 2018-04-06 ENCOUNTER — Encounter (HOSPITAL_COMMUNITY)
Admission: EM | Disposition: A | Discharge status: Home / Self Care | Payer: Self-pay | Source: Home / Self Care | Attending: Internal Medicine

## 2018-04-06 DIAGNOSIS — D509 Iron deficiency anemia, unspecified: Secondary | ICD-10-CM | POA: Diagnosis present

## 2018-04-06 DIAGNOSIS — I781 Nevus, non-neoplastic: Secondary | ICD-10-CM | POA: Diagnosis present

## 2018-04-06 DIAGNOSIS — Z8249 Family history of ischemic heart disease and other diseases of the circulatory system: Secondary | ICD-10-CM | POA: Diagnosis not present

## 2018-04-06 DIAGNOSIS — Z66 Do not resuscitate: Secondary | ICD-10-CM | POA: Diagnosis present

## 2018-04-06 DIAGNOSIS — D696 Thrombocytopenia, unspecified: Secondary | ICD-10-CM | POA: Diagnosis not present

## 2018-04-06 DIAGNOSIS — K5520 Angiodysplasia of colon without hemorrhage: Secondary | ICD-10-CM

## 2018-04-06 DIAGNOSIS — Z8042 Family history of malignant neoplasm of prostate: Secondary | ICD-10-CM | POA: Diagnosis not present

## 2018-04-06 DIAGNOSIS — K625 Hemorrhage of anus and rectum: Secondary | ICD-10-CM | POA: Diagnosis not present

## 2018-04-06 DIAGNOSIS — K922 Gastrointestinal hemorrhage, unspecified: Secondary | ICD-10-CM | POA: Diagnosis present

## 2018-04-06 DIAGNOSIS — C61 Malignant neoplasm of prostate: Secondary | ICD-10-CM | POA: Diagnosis not present

## 2018-04-06 DIAGNOSIS — K644 Residual hemorrhoidal skin tags: Secondary | ICD-10-CM | POA: Diagnosis present

## 2018-04-06 DIAGNOSIS — K5521 Angiodysplasia of colon with hemorrhage: Secondary | ICD-10-CM | POA: Diagnosis present

## 2018-04-06 DIAGNOSIS — K760 Fatty (change of) liver, not elsewhere classified: Secondary | ICD-10-CM | POA: Diagnosis present

## 2018-04-06 DIAGNOSIS — E876 Hypokalemia: Secondary | ICD-10-CM | POA: Diagnosis present

## 2018-04-06 DIAGNOSIS — K921 Melena: Secondary | ICD-10-CM | POA: Diagnosis present

## 2018-04-06 DIAGNOSIS — K648 Other hemorrhoids: Secondary | ICD-10-CM | POA: Diagnosis present

## 2018-04-06 DIAGNOSIS — F1020 Alcohol dependence, uncomplicated: Secondary | ICD-10-CM | POA: Diagnosis present

## 2018-04-06 DIAGNOSIS — R509 Fever, unspecified: Secondary | ICD-10-CM | POA: Diagnosis not present

## 2018-04-06 DIAGNOSIS — I10 Essential (primary) hypertension: Secondary | ICD-10-CM | POA: Diagnosis present

## 2018-04-06 DIAGNOSIS — K5730 Diverticulosis of large intestine without perforation or abscess without bleeding: Secondary | ICD-10-CM | POA: Diagnosis present

## 2018-04-06 DIAGNOSIS — D125 Benign neoplasm of sigmoid colon: Secondary | ICD-10-CM | POA: Diagnosis not present

## 2018-04-06 DIAGNOSIS — Z8546 Personal history of malignant neoplasm of prostate: Secondary | ICD-10-CM | POA: Diagnosis not present

## 2018-04-06 DIAGNOSIS — D62 Acute posthemorrhagic anemia: Secondary | ICD-10-CM | POA: Diagnosis present

## 2018-04-06 DIAGNOSIS — G40909 Epilepsy, unspecified, not intractable, without status epilepticus: Secondary | ICD-10-CM | POA: Diagnosis present

## 2018-04-06 DIAGNOSIS — K759 Inflammatory liver disease, unspecified: Secondary | ICD-10-CM | POA: Diagnosis present

## 2018-04-06 DIAGNOSIS — D6959 Other secondary thrombocytopenia: Secondary | ICD-10-CM | POA: Diagnosis present

## 2018-04-06 DIAGNOSIS — K649 Unspecified hemorrhoids: Secondary | ICD-10-CM | POA: Diagnosis not present

## 2018-04-06 DIAGNOSIS — K219 Gastro-esophageal reflux disease without esophagitis: Secondary | ICD-10-CM | POA: Diagnosis present

## 2018-04-06 DIAGNOSIS — Z923 Personal history of irradiation: Secondary | ICD-10-CM | POA: Diagnosis not present

## 2018-04-06 DIAGNOSIS — E785 Hyperlipidemia, unspecified: Secondary | ICD-10-CM | POA: Diagnosis present

## 2018-04-06 HISTORY — PX: COLONOSCOPY: SHX5424

## 2018-04-06 HISTORY — PX: POLYPECTOMY: SHX5525

## 2018-04-06 LAB — CBC
HEMATOCRIT: 33.4 % — AB (ref 39.0–52.0)
HEMOGLOBIN: 10.5 g/dL — AB (ref 13.0–17.0)
MCH: 27.3 pg (ref 26.0–34.0)
MCHC: 31.4 g/dL (ref 30.0–36.0)
MCV: 87 fL (ref 78.0–100.0)
Platelets: 105 10*3/uL — ABNORMAL LOW (ref 150–400)
RBC: 3.84 MIL/uL — ABNORMAL LOW (ref 4.22–5.81)
RDW: 15.1 % (ref 11.5–15.5)
WBC: 6.2 10*3/uL (ref 4.0–10.5)

## 2018-04-06 LAB — PROTIME-INR
INR: 1.01
Prothrombin Time: 13.2 seconds (ref 11.4–15.2)

## 2018-04-06 SURGERY — COLONOSCOPY
Anesthesia: Moderate Sedation

## 2018-04-06 MED ORDER — MIDAZOLAM HCL 5 MG/5ML IJ SOLN
INTRAMUSCULAR | Status: AC
  Filled 2018-04-06: qty 10

## 2018-04-06 MED ORDER — MEPERIDINE HCL 50 MG/ML IJ SOLN
INTRAMUSCULAR | Status: DC | PRN
  Administered 2018-04-06 (×2): 25 mg via INTRAVENOUS

## 2018-04-06 MED ORDER — MIDAZOLAM HCL 5 MG/5ML IJ SOLN
INTRAMUSCULAR | Status: DC | PRN
  Administered 2018-04-06 (×3): 2 mg via INTRAVENOUS

## 2018-04-06 MED ORDER — PEG 3350-KCL-NA BICARB-NACL 420 G PO SOLR
4000.0000 mL | Freq: Once | ORAL | Status: AC
  Administered 2018-04-06: 4000 mL via ORAL
  Filled 2018-04-06: qty 4000

## 2018-04-06 MED ORDER — STERILE WATER FOR IRRIGATION IR SOLN
Status: DC | PRN
  Administered 2018-04-06: 2.5 mL

## 2018-04-06 MED ORDER — MEPERIDINE HCL 50 MG/ML IJ SOLN
INTRAMUSCULAR | Status: AC
  Filled 2018-04-06: qty 1

## 2018-04-06 NOTE — Progress Notes (Signed)
PROGRESS NOTE    Clifford [name]  HYW:[id number] DOB: Apr 04, 1951 DOA: 04/05/2018 PCP: [name], FNP   Brief Narrative:   67 year old male with a history of essential hypertension, prostate cancer status post radiation, diverticulosis, lower GI bleed secondary to angiodysplasia of the colon comes to the hospital with another episode of hematochezia with hemoglobin level of 6.9.  Patient reported of some dizziness and lightheadedness as well especially with standing up position.  In the ER hemoglobin was noted to be 7.1 therefore 2 units of PRBC was given.  Patient was admitted to the stepdown unit for closer monitoring  Assessment & Plan:   Principal Problem:   Acute blood loss anemia Active Problems:   Essential hypertension   Malignant neoplasm of prostate (HCC)   Acute lower GI bleeding   GI bleed  Lower GI bleed/acute blood loss anemia Hematochezia secondary to angiodysplasia in the rectal area -Status post 2 unit PRBC, hemoglobin is improved to 10.5.  No bowel movement yet this morning -Hemodynamically stable for now we will keep closely monitoring him -Gastroenterology consulted who plans on performing colonoscopy today, appreciate their input -Provide supportive care, bowel prep and GI discretion  Thrombocytopenia -Likely in the setting of alcohol use.  Will closely monitor this.  Essential hypertension -Takes Norvasc 10 mg at home daily.  Currently on hold  History of GERD - On Protonix  History of alcohol abuse - Closely monitor for signs of any withdrawal. CIWA in place   DVT prophylaxis: SCDs Code Status: Full code Family Communication: None at bedside Disposition Plan: To be determined  Consultants:   Gastroenterology  Procedures:   Plans for colonoscopy today  Antimicrobials:   None   Subjective: Denies any complaints this morning.  No bowel movement yet.  Review of Systems Otherwise negative except as per HPI, including: General:  Denies fever, chills, night sweats or unintended weight loss. Resp: Denies cough, wheezing, shortness of breath. Cardiac: Denies chest pain, palpitations, orthopnea, paroxysmal nocturnal dyspnea. GI: Denies abdominal pain, nausea, vomiting, diarrhea or constipation GU: Denies dysuria, frequency, hesitancy or incontinence MS: Denies muscle aches, joint pain or swelling Neuro: Denies headache, neurologic deficits (focal weakness, numbness, tingling), abnormal gait Psych: Denies anxiety, depression, SI/HI/AVH Skin: Denies new rashes or lesions ID: Denies sick contacts, exotic exposures, travel  Objective: Vitals:   04/06/18 0500 04/06/18 0600 04/06/18 0739 04/06/18 0741  BP: 126/69 (!) 141/72    Pulse: 92 90 89 99  Resp: 14 16 13 16   Temp:   99.1 F (37.3 C) 98.1 F (36.7 C)  TempSrc:   Oral Oral  SpO2: 95% 97% 97% 97%  Weight:      Height:        Intake/Output Summary (Last 24 hours) at 04/06/2018 1009 Last data filed at 04/06/2018 0606 Gross per 24 hour  Intake 1482.5 ml  Output 1200 ml  Net 282.5 ml   Filed Weights   04/05/18 1504 04/05/18 2109 04/06/18 0400  Weight: 68 kg (150 lb) 66.3 kg (146 lb 2.6 oz) 67.1 kg (147 lb 14.9 oz)    Examination:  General exam: Appears calm and comfortable  Respiratory system: Clear to auscultation. Respiratory effort normal. Cardiovascular system: S1 & S2 heard, RRR. No JVD, murmurs, rubs, gallops or clicks. No pedal edema. Gastrointestinal system: Abdomen is nondistended, soft and nontender. No organomegaly or masses felt. Normal bowel sounds heard. Central nervous system: Alert and oriented. No focal neurological deficits. Extremities: Symmetric 5 x 5 power. Skin: No rashes, lesions  or ulcers Psychiatry: Judgement and insight appear normal. Mood & affect appropriate.   Data Reviewed:   CBC: Recent Labs  Lab 04/04/18 1104 04/05/18 1539 04/06/18 0426  WBC  --  5.6 6.2  NEUTROABS  --  3.4  --   HGB 6.9* 7.1* 10.5*  HCT 20.7*  22.9* 33.4*  MCV  --  85.1 87.0  PLT  --  113* 222*   Basic Metabolic Panel: Recent Labs  Lab 04/05/18 1539  NA 131*  K 4.2  CL 96*  CO2 20*  GLUCOSE 95  BUN 14  CREATININE 1.38*  CALCIUM 8.1*   GFR: Estimated Creatinine Clearance: 45.2 mL/min (A) (by C-G formula based on SCr of 1.38 mg/dL (H)). Liver Function Tests: Recent Labs  Lab 04/05/18 1605  AST 110*  ALT 32  ALKPHOS 64  BILITOT 0.9  PROT 7.1  ALBUMIN 3.7   No results for input(s): LIPASE, AMYLASE in the last 168 hours. No results for input(s): AMMONIA in the last 168 hours. Coagulation Profile: No results for input(s): INR, PROTIME in the last 168 hours. Cardiac Enzymes: No results for input(s): CKTOTAL, CKMB, CKMBINDEX, TROPONINI in the last 168 hours. BNP (last 3 results) No results for input(s): PROBNP in the last 8760 hours. HbA1C: No results for input(s): HGBA1C in the last 72 hours. CBG: No results for input(s): GLUCAP in the last 168 hours. Lipid Profile: No results for input(s): CHOL, HDL, LDLCALC, TRIG, CHOLHDL, LDLDIRECT in the last 72 hours. Thyroid Function Tests: No results for input(s): TSH, T4TOTAL, FREET4, T3FREE, THYROIDAB in the last 72 hours. Anemia Panel: No results for input(s): VITAMINB12, FOLATE, FERRITIN, TIBC, IRON, RETICCTPCT in the last 72 hours. Sepsis Labs: No results for input(s): PROCALCITON, LATICACIDVEN in the last 168 hours.  Recent Results (from the past 240 hour(s))  MRSA PCR Screening     Status: None   Collection Time: 04/05/18  8:52 PM  Result Value Ref Range Status   MRSA by PCR NEGATIVE NEGATIVE Final    Comment:        The GeneXpert MRSA Assay (FDA approved for NASAL specimens only), is one component of a comprehensive MRSA colonization surveillance program. It is not intended to diagnose MRSA infection nor to guide or monitor treatment for MRSA infections. Performed at [facility], [address], [place], [place] [address]          Radiology  Studies: Dg Chest 2 View  Result Date: 04/05/2018 CLINICAL DATA:  Cough and weakness x1 month EXAM: CHEST - 2 VIEW COMPARISON:  None. FINDINGS: AP upright view of the chest. Borderline cardiomegaly. Mild uncoiling of the thoracic aorta without aneurysm. Minimal bibasilar atelectasis. No pulmonary edema or pulmonary consolidation. No acute osseous abnormality. Mild degenerative change about both AC joints. IMPRESSION: No active cardiopulmonary disease. Electronically Signed   By: Ashley Royalty M.D.   On: 04/05/2018 17:04        Scheduled Meds: . loratadine  10 mg Oral Daily  . multivitamin with minerals  1 tablet Oral Daily  . pantoprazole  40 mg Oral Daily   Continuous Infusions: . sodium chloride Stopped (04/05/18 2209)  . sodium chloride 75 mL/hr at 04/06/18 0606     LOS: 0 days    I have spent 35 minutes face to face with the patient and on the ward discussing the patients care, assessment, plan and disposition with other care givers. >50% of the time was devoted counseling the patient about the risks and benefits of treatment and  coordinating care.     Laird Runnion Arsenio Loader, MD [facility] Pager [phone]   If 7PM-7AM, please contact night-coverage [email or web address] Password [email or web address] 04/06/2018, 10:09 AM

## 2018-04-06 NOTE — Progress Notes (Signed)
Preprocedure note. Colonoscopy.  Normal terminal ileum. Few small diverticula at sigmoid colon. 5 mm polyp hot snare from mid sigmoid colon. Large telangiectasia at the distal rectum overlying internal hemorrhoids without active bleeding. No therapy rendered as APC may make bleeding worse.

## 2018-04-06 NOTE — Op Note (Signed)
[facility] Patient Name: Clifford [name] Procedure Date: 04/06/2018 1:22 PM MRN: [id number] Date of Birth: Oct 05, 1951 Attending MD: Hildred Laser , MD CSN: [id number] Age: 67 Admit Type: Inpatient Procedure:                Colonoscopy Indications:              Rectal bleeding, Acute post hemorrhagic anemia Providers:                Hildred Laser, MD, Lurline Del, RN, Nelma Rothman,                            Technician Referring MD:              Medicines:                Meperidine 50 mg IV, Midazolam 6 mg IV Complications:            No immediate complications. Estimated Blood Loss:     Estimated blood loss: none. Procedure:                Pre-Anesthesia Assessment:                           - Prior to the procedure, a History and Physical                            was performed, and patient medications and                            allergies were reviewed. The patient's tolerance of                            previous anesthesia was also reviewed. The risks                            and benefits of the procedure and the sedation                            options and risks were discussed with the patient.                            All questions were answered, and informed consent                            was obtained. Prior Anticoagulants: The patient has                            taken no previous anticoagulant or antiplatelet                            agents. ASA Grade Assessment: II - A patient with                            mild systemic disease. After reviewing the risks  and benefits, the patient was deemed in                            satisfactory condition to undergo the procedure.                           After obtaining informed consent, the colonoscope                            was passed under direct vision. Throughout the                            procedure, the patient's blood pressure, pulse, and   oxygen saturations were monitored continuously. The                            EC-[id number] ([id number]) scope was introduced through                            the anus and advanced to the the terminal ileum,                            with identification of the appendiceal orifice and                            IC valve. The colonoscopy was performed without                            difficulty. The patient tolerated the procedure                            well. The quality of the bowel preparation was                            excellent. The terminal ileum, ileocecal valve,                            appendiceal orifice, and rectum were photographed. Scope In: 1:51:38 PM Scope Out: 2:16:08 PM Scope Withdrawal Time: 0 hours 19 minutes 53 seconds  Total Procedure Duration: 0 hours 24 minutes 30 seconds  Findings:      The perianal and digital rectal examinations were normal.      The terminal ileum appeared normal.      The descending colon, splenic flexure, transverse colon, hepatic       flexure, ascending colon, cecum, appendiceal orifice and ileocecal valve       appeared normal.      A few small-mouthed diverticula were found in the sigmoid colon.      A 5 mm polyp was found in the mid sigmoid colon. The polyp was       semi-pedunculated. The polyp was removed with a hot snare. Resection and       retrieval were complete.      Multiple large angioectasias without bleeding were found in the distal       rectum.      Internal hemorrhoids were found during retroflexion.  The hemorrhoids       were medium-sized. Impression:               - The examined portion of the ileum was normal.                           - The descending colon, splenic flexure, transverse                            colon, hepatic flexure, ascending colon, cecum,                            appendiceal orifice and ileocecal valve are normal.                           - Diverticulosis in the sigmoid colon.                            - One 5 mm polyp in the mid sigmoid colon, removed                            with a hot snare. Resected and retrieved.                           - Multiple non-bleeding colonic angioectasias.                           - Internal hemorrhoids. Moderate Sedation:      Moderate (conscious) sedation was administered by the endoscopy nurse       and supervised by the endoscopist. The following parameters were       monitored: oxygen saturation, heart rate, blood pressure, CO2       capnography and response to care. Total physician intraservice time was       31 minutes. Recommendation:           - Return patient to ICU for ongoing care.                           - Cardiac diet today.                           - Continue present medications.                           - Surgical consultation.                           - Repeat colonoscopy is recommended. The                            colonoscopy date will be determined after pathology                            results from today's exam become available for  review. Procedure Code(s):        --- Professional ---                           [phone], Colonoscopy, flexible; with removal of                            tumor(s), polyp(s), or other lesion(s) by snare                            technique                           G0500, Moderate sedation services provided by the                            same physician or other qualified health care                            professional performing a gastrointestinal                            endoscopic service that sedation supports,                            requiring the presence of an independent trained                            observer to assist in the monitoring of the                            patient's level of consciousness and physiological                            status; initial 15 minutes of intra-service time;                             patient age 70 years or older (additional time may                            be reported with [phone], as appropriate)                           [phone], Moderate sedation services provided by the                            same physician or other qualified health care                            professional performing the diagnostic or                            therapeutic service that the sedation supports,  requiring the presence of an independent trained                            observer to assist in the monitoring of the                            patient's level of consciousness and physiological                            status; each additional 15 minutes intraservice                            time (List separately in addition to code for                            primary service) Diagnosis Code(s):        --- Professional ---                           K64.8, Other hemorrhoids                           D12.5, Benign neoplasm of sigmoid colon                           K55.20, Angiodysplasia of colon without hemorrhage                           K62.5, Hemorrhage of anus and rectum                           D62, Acute posthemorrhagic anemia                           K57.30, Diverticulosis of large intestine without                            perforation or abscess without bleeding CPT copyright 2017 [employer]. All rights reserved. The codes documented in this report are preliminary and upon coder review may  be revised to meet current compliance requirements. Hildred Laser, MD Hildred Laser, MD 04/06/2018 2:34:29 PM This report has been signed electronically. Number of Addenda: 0

## 2018-04-06 NOTE — Consult Note (Signed)
Referring Provider: Gerlean Ren, MD Primary Care Physician:  [name], FNP Primary Gastroenterologist:  Dr. Laural Golden  Reason for Consultation:    Rectal bleeding and anemia.  HPI:   Patient is 67 year old Afro-American male who has history of rectal bleeding secondary to telangiectasia resulting from radiation therapy for prostate cancer last year.  He underwent therapeutic flexible sigmoidoscopy on 2/29/2019 when he was noted to be actively bleeding from rectal telangiectasia and bleeding was controlled with hemospray.  Other modalities could not be used because he was not prepped. Patient was seen in the office by Ms. Setzer. NP and was doing fine.  His hemoglobin was 8.1 and he return for scheduled blood work 2 days ago.  Hemoglobin was reported to be 6.9 g.  I therefore contacted patient's wife and advised that he be brought to emergency room.  Patient was hospitalized.  He has received 2 units of PRBCs and feels much better.  Patient states he noted a small amount of blood every now and then but for the last 1 week he has been passing large amount of bright red blood every time he has a bowel movement.  He states he is prone to constipation.  He takes Hardin Negus' milk of magnesia every now and then.  His wife states he supposed to take Metamucil but he refuses to.  He has been feeling weak and lightheaded for the last 2 weeks or so.  He denies abdominal pain nausea vomiting or hematemesis.  He does not take aspirin or other NSAIDs. He continues to drink alcohol.  He drinks gin every day.  He does not know how many drinks he has been he is fully aware that he is drinking too much.  He does not feel nervous. He has good appetite and denies weight loss. His wife states he is a free bleeder.  Patient's last screening colonoscopy was in November 2017 when he was noted to have small internal and external hemorrhoids.  This colonoscopy was prior to him receiving radiation therapy for prostate  cancer.  At that time his hemoglobin was 9.8 g platelet count was normal at 177K.  Patient is retired.  He worked at [employer] for 49 years.  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  He has not worked this year.  Alcohol history as above.  He has never smoked cigarettes. He and his wife live in [place].  Father lived to be in his 23s.  Mother died at 75. Has a brother age 33 in good health and his sister age 67 in good health.    Past Medical History:  Diagnosis Date  . Alcoholism ([place])   . Anemia   . High cholesterol   . Hypertension   . Prostate cancer ([place])   . Seizures ([place])     Past Surgical History:  Procedure Laterality Date  . CIRCUMCISION    . COLONOSCOPY N/A 09/13/2016   Procedure: COLONOSCOPY;  Surgeon: Rogene Houston, MD;  Location: [facility];  Service: Endoscopy;  Laterality: N/A;  2:15  . FLEXIBLE SIGMOIDOSCOPY N/A 01/10/2018   Procedure: FLEXIBLE SIGMOIDOSCOPY;  Surgeon: Rogene Houston, MD;  Location: [facility];  Service: Endoscopy;  Laterality: N/A;  1:55  . NO PAST SURGERIES    . PROSTATE BIOPSY      Prior to Admission medications   Medication Sig Start Date End Date Taking? Authorizing Provider  acetaminophen (TYLENOL) 500 MG  tablet Take 500 mg by mouth every 6 (six) hours as needed for moderate pain or headache.    Yes [provider]  amLODipine (NORVASC) 10 MG tablet Take 10 mg by mouth daily.   Yes [provider]  loratadine (CLARITIN) 10 MG tablet Take 10 mg by mouth daily.   Yes [provider]  Multiple Vitamin (MULTIVITAMIN WITH MINERALS) TABS tablet Take 1 tablet by mouth daily.   Yes [provider]  pantoprazole (PROTONIX) 40 MG tablet Take 40 mg by mouth daily.   Yes [provider]  Wheat Dextrin (BENEFIBER DRINK MIX) PACK Take 4 g by mouth at bedtime. 01/10/18  Yes Rehman, Mechele Dawley, MD    Current  Facility-Administered Medications  Medication Dose Route Frequency Provider Last Rate Last Dose  . 0.9 %  sodium chloride infusion   Intravenous Continuous Truett Mainland, DO   Stopped at 04/05/18 2209  . 0.9 %  sodium chloride infusion   Intravenous Continuous Truett Mainland, DO 75 mL/hr at 04/06/18 0606    . acetaminophen (TYLENOL) tablet 650 mg  650 mg Oral Q6H PRN Truett Mainland, DO       Or  . acetaminophen (TYLENOL) suppository 650 mg  650 mg Rectal Q6H PRN Truett Mainland, DO      . loratadine (CLARITIN) tablet 10 mg  10 mg Oral Daily Truett Mainland, DO   10 mg at 04/06/18 1011  . LORazepam (ATIVAN) injection 2-3 mg  2-3 mg Intravenous Q1H PRN Lovey Newcomer T, NP   2 mg at 04/06/18 0013  . multivitamin with minerals tablet 1 tablet  1 tablet Oral Daily Lovey Newcomer T, NP   1 tablet at 04/06/18 1011  . ondansetron (ZOFRAN) tablet 4 mg  4 mg Oral Q6H PRN Truett Mainland, DO       Or  . ondansetron [facility]) injection 4 mg  4 mg Intravenous Q6H PRN Truett Mainland, DO      . pantoprazole (PROTONIX) EC tablet 40 mg  40 mg Oral Daily Truett Mainland, DO   40 mg at 04/06/18 1011    Allergies as of 04/05/2018 - Review Complete 04/05/2018  Allergen Reaction Noted  . Lisinopril Swelling 09/11/2016    Family History  Problem Relation Age of Onset  . Alzheimer's disease Mother   . Hypertension Mother   . Hypertension Father   . Stroke Father   . Prostate cancer Father   . Hypertension Brother     Social History   Socioeconomic History  . Marital status: Married    Spouse name: Not on file  . Number of children: Not on file  . Years of education: Not on file  . Highest education level: Not on file  Occupational History  . Not on file  Social Needs  . Financial resource strain: Not on file  . Food insecurity:    Worry: Not on file    Inability: Not on file  . Transportation needs:    Medical: Not on file    Non-medical: Not on file  Tobacco Use  . Smoking  status: Never Smoker  . Smokeless tobacco: Never Used  Substance and Sexual Activity  . Alcohol use: Yes    Comment: 1-2 bottles of Gin every few days  . Drug use: No  . Sexual activity: Not on file  Lifestyle  . Physical activity:    Days per week: Not on file    Minutes per session:  Not on file  . Stress: Not on file  Relationships  . Social connections:    Talks on phone: Not on file    Gets together: Not on file    Attends religious service: Not on file    Active member of club or organization: Not on file    Attends meetings of clubs or organizations: Not on file    Relationship status: Not on file  . Intimate partner violence:    Fear of current or ex partner: Not on file    Emotionally abused: Not on file    Physically abused: Not on file    Forced sexual activity: Not on file  Other Topics Concern  . Not on file  Social History Narrative  . Not on file    Review of Systems: See HPI, otherwise normal ROS  Physical Exam: Temp:  [98.1 F (36.7 C)-99.6 F (37.6 C)] 98.1 F (36.7 C) (05/25 0741) Pulse Rate:  [85-99] 99 (05/25 0741) Resp:  [13-32] 16 (05/25 0741) BP: (102-156)/(59-82) 141/72 (05/25 0600) SpO2:  [92 %-100 %] 97 % (05/25 0741) Weight:  [146 lb 2.6 oz (66.3 kg)-150 lb (68 kg)] 147 lb 14.9 oz (67.1 kg) (05/25 0400) Last BM Date: 04/05/18  Patient is alert and in no acute distress. He does not have tremors. Conjunctiva is pale.  Sclerae nonicteric.   Oropharyngeal mucosa is normal.   He has 4 teeth left in lower jaw. No neck masses or thyromegaly noted. Cardiac exam with regular rhythm normal S1 and S2.  No murmur or gallop noted. Auscultation lungs reveal vesicular breath sounds bilaterally. Abdomen is symmetrical.  Bowel sounds are normal.  On palpation abdomen is soft and nontender without organomegaly or masses. No peripheral edema or clubbing noted.    Lab Results: Recent Labs    04/04/18 1104 04/05/18 1539 04/06/18 0426  WBC  --   5.6 6.2  HGB 6.9* 7.1* 10.5*  HCT 20.7* 22.9* 33.4*  PLT  --  113* 105*   BMET Recent Labs    04/05/18 1539  NA 131*  K 4.2  CL 96*  CO2 20*  GLUCOSE 95  BUN 14  CREATININE 1.38*  CALCIUM 8.1*   LFT Recent Labs    04/05/18 1605  PROT 7.1  ALBUMIN 3.7  AST 110*  ALT 32  ALKPHOS 64  BILITOT 0.9  BILIDIR 0.2  IBILI 0.7    Studies/Results: Dg Chest 2 View  Result Date: 04/05/2018 CLINICAL DATA:  Cough and weakness x1 month EXAM: CHEST - 2 VIEW COMPARISON:  None. FINDINGS: AP upright view of the chest. Borderline cardiomegaly. Mild uncoiling of the thoracic aorta without aneurysm. Minimal bibasilar atelectasis. No pulmonary edema or pulmonary consolidation. No acute osseous abnormality. Mild degenerative change about both AC joints. IMPRESSION: No active cardiopulmonary disease. Electronically Signed   By: Ashley Royalty M.D.   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  He moved straight was used with hemostasis.  Now he presents with recurrent rectal bleeding started about a week ago.  Routine blood work revealed hemoglobin of 6.9 g.  Patient has been transfused 2 units of PRBCs and hemoglobin is up to 10.5 g and he feels much better.  Suspect he is bleeding from rectal telangiectasia.  He would need therapy with APC or BiCAP for which she will have to be prepped.  Patient has thrombocytopenia.  While his platelet count is not critically low he may have platelet dysfunction secondary to alcohol abuse.  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  Transaminases are normal.  Recommendations;  Will check INR. Colonoscopy with therapeutic intention later today. Patient needs to quit drinking alcohol or at least keep it down to no more than 2 drinks per day.  This issue was discussed with his wife  and she will have to watch him in seek outpatient help if necessary.   LOS: 0 days   Najeeb Rehman  04/06/2018, 10:34 AM

## 2018-04-07 DIAGNOSIS — D62 Acute posthemorrhagic anemia: Secondary | ICD-10-CM

## 2018-04-07 DIAGNOSIS — R509 Fever, unspecified: Secondary | ICD-10-CM

## 2018-04-07 DIAGNOSIS — K648 Other hemorrhoids: Principal | ICD-10-CM

## 2018-04-07 LAB — URINALYSIS, ROUTINE W REFLEX MICROSCOPIC
BACTERIA UA: NONE SEEN
BILIRUBIN URINE: NEGATIVE
Glucose, UA: NEGATIVE mg/dL
KETONES UR: 5 mg/dL — AB
LEUKOCYTES UA: NEGATIVE
NITRITE: NEGATIVE
PH: 7 (ref 5.0–8.0)
PROTEIN: 30 mg/dL — AB
Specific Gravity, Urine: 1.008 (ref 1.005–1.030)

## 2018-04-07 LAB — CBC
HCT: 34.2 % — ABNORMAL LOW (ref 39.0–52.0)
Hemoglobin: 10.7 g/dL — ABNORMAL LOW (ref 13.0–17.0)
MCH: 27.2 pg (ref 26.0–34.0)
MCHC: 31.3 g/dL (ref 30.0–36.0)
MCV: 86.8 fL (ref 78.0–100.0)
PLATELETS: 104 10*3/uL — AB (ref 150–400)
RBC: 3.94 MIL/uL — ABNORMAL LOW (ref 4.22–5.81)
RDW: 15 % (ref 11.5–15.5)
WBC: 6.4 10*3/uL (ref 4.0–10.5)

## 2018-04-07 LAB — HIV ANTIBODY (ROUTINE TESTING W REFLEX): HIV Screen 4th Generation wRfx: NONREACTIVE

## 2018-04-07 MED ORDER — GUAIFENESIN-DM 100-10 MG/5ML PO SYRP
5.0000 mL | ORAL_SOLUTION | ORAL | Status: DC | PRN
  Administered 2018-04-07 (×2): 5 mL via ORAL
  Filled 2018-04-07 (×2): qty 5

## 2018-04-07 MED ORDER — DOCUSATE SODIUM 100 MG PO CAPS
100.0000 mg | ORAL_CAPSULE | Freq: Two times a day (BID) | ORAL | Status: DC
  Administered 2018-04-07 – 2018-04-09 (×5): 100 mg via ORAL
  Filled 2018-04-07 (×5): qty 1

## 2018-04-07 NOTE — Progress Notes (Signed)
  Subjective:  Patient has no complaints.  He denies nausea vomiting or abdominal pain.  He has been passing flatus but has not passed blood per rectum since admission.  Objective: Blood pressure (!) 144/85, pulse 89, temperature (!) 100.4 F (38 C), temperature source Oral, resp. rate 16, height 5\' 5"  (1.651 m), weight 147 lb 14.9 oz (67.1 kg), SpO2 95 %.  Patient is alert and does not have tremors or asterixis. Cardiac exam with regular rhythm normal S1 and S2.  No murmur gallop noted. Lungs are clear to auscultation. Abdomen is soft and nontender without organomegaly or masses. No LE edema noted. Labs/studies Results:  Recent Labs    04-16-18 1539 04/06/18 0426 04/07/18 0614  WBC 5.6 6.2 6.4  HGB 7.1* 10.5* 10.7*  HCT 22.9* 33.4* 34.2*  PLT 113* 105* 104*    BMET  Recent Labs    Apr 16, 2018 1539  NA 131*  K 4.2  CL 96*  CO2 20*  GLUCOSE 95  BUN 14  CREATININE 1.38*  CALCIUM 8.1*    LFT  Recent Labs    2018-04-16 1605  PROT 7.1  ALBUMIN 3.7  AST 110*  ALT 32  ALKPHOS 64  BILITOT 0.9  BILIDIR 0.2  IBILI 0.7    PT/INR  Recent Labs    04/06/18 1124  LABPROT 13.2  INR 1.01      Assessment:  #1.  Rectal bleeding secondary to extensive distal rectal telangiectasia overlying internal hemorrhoids noted on colonoscopy yesterday.  I felt his condition was not amenable to APC.  Patient has not bled since hospitalization.  He has received 2 units of PRBCs.  He has been evaluated by Dr. Arnoldo Morale who is planning surgery next week.  #2.  Anemia possibly acute on chronic secondary to rectal bleeding.  H&H is low but stable.  #3.  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  #4.  Fever.  Blood cultures have been obtained patient has no localizing symptoms.  He could have fever due to alcohol hepatitis but transaminases on admission were normal.   Recommendations:  We will check LFTs in a.m. Colace 100 mg p.o. twice daily as he is prone to  constipation.  Decrease IV fluid to 50 mL/h.

## 2018-04-07 NOTE — Progress Notes (Signed)
PROGRESS NOTE    Clifford [name]  ZOX:[id number] DOB: 11/29/1950 DOA: 04/05/2018 PCP: [name], FNP   Brief Narrative:   67 year old male with a history of essential hypertension, prostate cancer status post radiation, diverticulosis, lower GI bleed secondary to angiodysplasia of the colon comes to the hospital with another episode of hematochezia with hemoglobin level of 6.9.  Patient reported of some dizziness and lightheadedness as well especially with standing up position.  In the ER hemoglobin was noted to be 7.1 therefore 2 units of PRBC was given.  Patient was admitted to the stepdown unit for closer monitoring. He underwent C scope which showed internal hemorrhoids and telangiectasias but none was actively bleeding. Gen Surgery was consulted by GI.   Assessment & Plan:   Principal Problem:   Acute blood loss anemia Active Problems:   Essential hypertension   Malignant neoplasm of prostate ([facility])   Acute lower GI bleeding   GI bleed   Bleeding internal hemorrhoids  Lower GI bleed/acute blood loss anemia Hematochezia secondary to angiodysplasia in the rectal area -Status post 2 unit PRBC on 5/25, hemoglobin is improved to 10.5 and now remains stable.  No bowel movement yet this morning -Hemodynamically stable for now we will keep closely monitoring him -s/p C scope 5/25= showed Internal Hemorrhroids and telangiectasias, but no actively bleeding. Gen Surgery consulted for possible surgical intervention on 5/28, Spoke with Dr Arnoldo Morale this morning. Appreciate his input.  -will need to be NPO PMN for 5/28.   Fever; low grade overnight.  -will check UA, BCx. If necessary, will start him on Abx.   Thrombocytopenia -Likely in the setting of alcohol use.  Will closely monitor this.  Essential hypertension -Takes Norvasc 10 mg at home daily.  Currently on hold  History of GERD - On Protonix  History of alcohol abuse - Closely monitor for signs of any withdrawal. CIWA  in place   DVT prophylaxis: SCDs Code Status: Full code Family Communication: None at bedside Disposition Plan: TBD  Consultants:   Gastroenterology  Procedures:   C scope 5/25  Antimicrobials:   None   Subjective: No bleeding overnight, Low grade temp this morning. He denies any complaints.   Review of Systems Otherwise negative except as per HPI, including: General = no chills, dizziness, malaise, fatigue HEENT/EYES = negative for pain, redness, loss of vision, double vision, blurred vision, loss of hearing, sore throat, hoarseness, dysphagia Cardiovascular= negative for chest pain, palpitation, murmurs, lower extremity swelling Respiratory/lungs= negative for shortness of breath, cough, hemoptysis, wheezing, mucus production Gastrointestinal= negative for nausea, vomiting,, abdominal pain, melena, hematemesis Genitourinary= negative for Dysuria, Hematuria, Change in Urinary Frequency MSK = Negative for arthralgia, myalgias, Back Pain, Joint swelling  Neurology= Negative for headache, seizures, numbness, tingling  Psychiatry= Negative for anxiety, depression, suicidal and homocidal ideation Allergy/Immunology= Medication/Food allergy as listed  Skin= Negative for Rash, lesions, ulcers, itching   Objective: Vitals:   04/06/18 1607 04/06/18 1630 04/06/18 2301 04/07/18 0633  BP:  133/78 (!) 152/80 (!) 144/85  Pulse: 87 81 94 89  Resp: 13 17 16 16   Temp: 98.2 F (36.8 C) 98.5 F (36.9 C) 99.7 F (37.6 C) (!) 100.4 F (38 C)  TempSrc: Oral Oral Oral Oral  SpO2: 96% 97% 96% 95%  Weight:      Height:        Intake/Output Summary (Last 24 hours) at 04/07/2018 0950 Last data filed at 04/07/2018 0900 Gross per 24 hour  Intake 1740 ml  Output  1750 ml  Net -10 ml   Filed Weights   04/05/18 1504 04/05/18 2109 04/06/18 0400  Weight: 68 kg (150 lb) 66.3 kg (146 lb 2.6 oz) 67.1 kg (147 lb 14.9 oz)    Examination: Constitutional: NAD, calm, comfortable Eyes:  PERRL, lids and conjunctivae normal ENMT: Mucous membranes are moist. Posterior pharynx clear of any exudate or lesions.Normal dentition.  Neck: normal, supple, no masses, no thyromegaly Respiratory: clear to auscultation bilaterally, no wheezing, no crackles. Normal respiratory effort. No accessory muscle use.  Cardiovascular: Regular rate and rhythm, no murmurs / rubs / gallops. No extremity edema. 2+ pedal pulses. No carotid bruits.  Abdomen: no tenderness, no masses palpated. No hepatosplenomegaly. Bowel sounds positive.  Musculoskeletal: no clubbing / cyanosis. No joint deformity upper and lower extremities. Good ROM, no contractures. Normal muscle tone.  Skin: no rashes, lesions, ulcers. No induration Neurologic: CN 2-12 grossly intact. Sensation intact, DTR normal. Strength 5/5 in all 4.  Psychiatric: Normal judgment and insight. Alert and oriented x 3. Normal mood.   Data Reviewed:   CBC: Recent Labs  Lab 04/04/18 1104 04/05/18 1539 04/06/18 0426 04/07/18 0614  WBC  --  5.6 6.2 6.4  NEUTROABS  --  3.4  --   --   HGB 6.9* 7.1* 10.5* 10.7*  HCT 20.7* 22.9* 33.4* 34.2*  MCV  --  85.1 87.0 86.8  PLT  --  113* 105* 440*   Basic Metabolic Panel: Recent Labs  Lab 04/05/18 1539  NA 131*  K 4.2  CL 96*  CO2 20*  GLUCOSE 95  BUN 14  CREATININE 1.38*  CALCIUM 8.1*   GFR: Estimated Creatinine Clearance: 45.2 mL/min (A) (by C-G formula based on SCr of 1.38 mg/dL (H)). Liver Function Tests: Recent Labs  Lab 04/05/18 1605  AST 110*  ALT 32  ALKPHOS 64  BILITOT 0.9  PROT 7.1  ALBUMIN 3.7   No results for input(s): LIPASE, AMYLASE in the last 168 hours. No results for input(s): AMMONIA in the last 168 hours. Coagulation Profile: Recent Labs  Lab 04/06/18 1124  INR 1.01   Cardiac Enzymes: No results for input(s): CKTOTAL, CKMB, CKMBINDEX, TROPONINI in the last 168 hours. BNP (last 3 results) No results for input(s): PROBNP in the last 8760 hours. HbA1C: No  results for input(s): HGBA1C in the last 72 hours. CBG: No results for input(s): GLUCAP in the last 168 hours. Lipid Profile: No results for input(s): CHOL, HDL, LDLCALC, TRIG, CHOLHDL, LDLDIRECT in the last 72 hours. Thyroid Function Tests: No results for input(s): TSH, T4TOTAL, FREET4, T3FREE, THYROIDAB in the last 72 hours. Anemia Panel: No results for input(s): VITAMINB12, FOLATE, FERRITIN, TIBC, IRON, RETICCTPCT in the last 72 hours. Sepsis Labs: No results for input(s): PROCALCITON, LATICACIDVEN in the last 168 hours.  Recent Results (from the past 240 hour(s))  MRSA PCR Screening     Status: None   Collection Time: 04/05/18  8:52 PM  Result Value Ref Range Status   MRSA by PCR NEGATIVE NEGATIVE Final    Comment:        The GeneXpert MRSA Assay (FDA approved for NASAL specimens only), is one component of a comprehensive MRSA colonization surveillance program. It is not intended to diagnose MRSA infection nor to guide or monitor treatment for MRSA infections. Performed at [facility], [address], [place], [place] [address]          Radiology Studies: Dg Chest 2 View  Result Date: 04/05/2018 CLINICAL DATA:  Cough and  weakness x1 month EXAM: CHEST - 2 VIEW COMPARISON:  None. FINDINGS: AP upright view of the chest. Borderline cardiomegaly. Mild uncoiling of the thoracic aorta without aneurysm. Minimal bibasilar atelectasis. No pulmonary edema or pulmonary consolidation. No acute osseous abnormality. Mild degenerative change about both AC joints. IMPRESSION: No active cardiopulmonary disease. Electronically Signed   By: Ashley Royalty M.D.   On: 04/05/2018 17:04        Scheduled Meds: . loratadine  10 mg Oral Daily  . multivitamin with minerals  1 tablet Oral Daily  . pantoprazole  40 mg Oral Daily   Continuous Infusions: . sodium chloride Stopped (04/05/18 2209)  . sodium chloride 75 mL/hr at 04/07/18 0400     LOS: 1 day    I have spent 25 minutes face to  face with the patient and on the ward discussing the patients care, assessment, plan and disposition with other care givers. >50% of the time was devoted counseling the patient about the risks and benefits of treatment and coordinating care.     Hiba Garry Arsenio Loader, MD [facility] Pager [phone]   If 7PM-7AM, please contact night-coverage [email or web address] Password [email or web address] 04/07/2018, 9:50 AM

## 2018-04-07 NOTE — Consult Note (Signed)
Reason for Consult: Anemia, telangiectasias and hemorrhoids Referring Physician: Dr. Reginia Forts is an 67 y.o. [name].  HPI: Patient is a Clifford [name] who presented to [facility] with anemia requiring multiple blood transfusions.  He also stated he has been passing blood per rectum for some time now.  Patient underwent a colonoscopy yesterday by Dr. Laural Golden and was found to have internal hemorrhoids with surrounding telangiectasias.  They were not actively bleeding at the time.  The patient has been referred to my care for formal hemorrhoidectomy.  Patient currently having no pain.  He is not actively bleeding today.  Past Medical History:  Diagnosis Date  . Alcoholism ([place])   . Anemia   . High cholesterol   . Hypertension   . Prostate cancer ([place])   . Seizures ([place])     Past Surgical History:  Procedure Laterality Date  . CIRCUMCISION    . COLONOSCOPY N/A 09/13/2016   Procedure: COLONOSCOPY;  Surgeon: Rogene Houston, MD;  [facility];  Service: Endoscopy;  Laterality: N/A;  2:15  . FLEXIBLE SIGMOIDOSCOPY N/A 01/10/2018   Procedure: FLEXIBLE SIGMOIDOSCOPY;  Surgeon: Rogene Houston, MD;  Location[facility];  Service: Endoscopy;  Laterality: N/A;  1:55  . NO PAST SURGERIES    . PROSTATE BIOPSY      Family History  Problem Relation Age of Onset  . Alzheimer's disease Mother   . Hypertension Mother   . Hypertension Father   . Stroke Father   . Prostate cancer Father   . Hypertension Brother     Social History:  reports that he has never smoked. He has never used smokeless tobacco. He reports that he drinks alcohol. He reports that he does not use drugs.  Allergies:  Allergies  Allergen Reactions  . Lisinopril Swelling    Medications: I have reviewed the patient's current medications.  Results for orders placed or performed during the hospital encounter of 04/05/18 (from the past 48 hour(s))  CBC with Differential     Status:  Abnormal   Collection Time: 04/05/18  3:39 PM  Result Value Ref Range   WBC 5.6 4.0 - 10.5 K/uL   RBC 2.69 (L) 4.22 - 5.81 MIL/uL   Hemoglobin 7.1 (L) 13.0 - 17.0 g/dL   HCT 22.9 (L) 39.0 - 52.0 %   MCV 85.1 78.0 - 100.0 fL   MCH 26.4 26.0 - 34.0 pg   MCHC 31.0 30.0 - Clifford.0 g/dL   RDW 15.0 11.5 - 15.5 %   Platelets 113 (L) 150 - 400 K/uL    Comment: SPECIMEN CHECKED FOR CLOTS PLATELET COUNT CONFIRMED BY SMEAR    Neutrophils Relative % 59 %   Neutro Abs 3.4 1.7 - 7.7 K/uL   Lymphocytes Relative 17 %   Lymphs Abs 1.0 0.7 - 4.0 K/uL   Monocytes Relative 22 %   Monocytes Absolute 1.2 (H) 0.1 - 1.0 K/uL   Eosinophils Relative 1 %   Eosinophils Absolute 0.0 0.0 - 0.7 K/uL   Basophils Relative 1 %   Basophils Absolute 0.1 0.0 - 0.1 K/uL    Comment: Performed at [facility], [address] [place], [place] [address]  Basic metabolic panel     Status: Abnormal   Collection Time: 04/05/18  3:39 PM  Result Value Ref Range   Sodium 131 (L) 135 - 145 mmol/L   Potassium 4.2 3.5 - 5.1 mmol/L   Chloride 96 (L) 101 - 111 mmol/L  CO2 20 (L) 22 - 32 mmol/L   Glucose, Bld 95 65 - 99 mg/dL   BUN 14 6 - 20 mg/dL   Creatinine, Ser 1.38 (H) 0.61 - 1.24 mg/dL   Calcium 8.1 (L) 8.9 - 10.3 mg/dL   GFR calc non Af Amer 51 (L) >60 mL/min   GFR calc Af Amer 60 (L) >60 mL/min    Comment: (NOTE) The eGFR has been calculated using the CKD EPI equation. This calculation has not been validated in all clinical situations. eGFR's persistently <60 mL/min signify possible Chronic Kidney Disease.    Anion gap 15 5 - 15    Comment: Performed at [facility], [address], [place], [place] [address]  Type and screen [facility]     Status: None (Preliminary result)   Collection Time: 04/05/18  3:39 PM  Result Value Ref Range   ABO/RH(D) A POS    Antibody Screen NEG    Sample Expiration 04/08/2018    Unit Number [id number]    Blood Component Type RED CELLS,LR    Unit division 00    Status  of Unit ALLOCATED    Transfusion Status OK TO TRANSFUSE    Crossmatch Result Compatible    Unit Number [id number]    Blood Component Type RED CELLS,LR    Unit division 00    Status of Unit ISSUED,FINAL    Transfusion Status OK TO TRANSFUSE    Crossmatch Result      Compatible Performed at [facility], [address], [place], [place] [address]    Unit Number [id number]    Blood Component Type RED CELLS,LR    Unit division 00    Status of Unit ISSUED,FINAL    Transfusion Status OK TO TRANSFUSE    Crossmatch Result Compatible   Prepare RBC     Status: None   Collection Time: 04/05/18  3:39 PM  Result Value Ref Range   Order Confirmation      ORDER PROCESSED BY BLOOD BANK Performed at [facility], [address], [place], [place] [address]   ABO/Rh     Status: None   Collection Time: 04/05/18  3:39 PM  Result Value Ref Range   ABO/RH(D)      A POS Performed at [facility], [address] [place], [place] [address]   Hepatic function panel     Status: Abnormal   Collection Time: 04/05/18  4:05 PM  Result Value Ref Range   Total Protein 7.1 6.5 - 8.1 g/dL   Albumin 3.7 3.5 - 5.0 g/dL   AST 110 (H) 15 - 41 U/L   ALT 32 17 - 63 U/L   Alkaline Phosphatase 64 38 - 126 U/L   Total Bilirubin 0.9 0.3 - 1.2 mg/dL   Bilirubin, Direct 0.2 0.1 - 0.5 mg/dL   Indirect Bilirubin 0.7 0.3 - 0.9 mg/dL    Comment: Performed at [facility], [address] [place], [place] [address]  POC occult blood, ED RN will collect     Status: Abnormal   Collection Time: 04/05/18  5:55 PM  Result Value Ref Range   Fecal Occult Bld POSITIVE (A) NEGATIVE  MRSA PCR Screening     Status: None   Collection Time: 04/05/18  8:52 PM  Result Value Ref Range   MRSA by PCR NEGATIVE NEGATIVE    Comment:        The GeneXpert MRSA Assay (FDA approved for NASAL specimens only), is one component of a comprehensive MRSA  colonization surveillance program. It is not intended to diagnose MRSA infection nor  to guide or monitor treatment for MRSA infections. Performed at [facility], [address] [place], [place] [address]   CBC     Status: Abnormal   Collection Time: 04/06/18  4:26 AM  Result Value Ref Range   WBC 6.2 4.0 - 10.5 K/uL   RBC 3.84 (L) 4.22 - 5.81 MIL/uL   Hemoglobin 10.5 (L) 13.0 - 17.0 g/dL    Comment: POST TRANSFUSION SPECIMEN DELTA CHECK NOTED    HCT 33.4 (L) 39.0 - 52.0 %   MCV 87.0 78.0 - 100.0 fL   MCH 27.3 26.0 - 34.0 pg   MCHC 31.4 30.0 - Clifford.0 g/dL   RDW 15.1 11.5 - 15.5 %   Platelets 105 (L) 150 - 400 K/uL    Comment: SPECIMEN CHECKED FOR CLOTS CONSISTENT WITH PREVIOUS RESULT Performed at [facility], [address] [place], [place] [address]   HIV antibody     Status: None   Collection Time: 04/06/18  8:18 AM  Result Value Ref Range   HIV Screen 4th Generation wRfx Non Reactive Non Reactive    Comment: (NOTE) Performed At: [facility] [place], [place] [id number] Rush Farmer MD FB:[phone] Performed at [facility], [address], [place], [place] [address]   Protime-INR     Status: None   Collection Time: 04/06/18 11:24 AM  Result Value Ref Range   Prothrombin Time 13.2 11.4 - 15.2 seconds   INR 1.01     Comment: Performed at [facility], [address] [place], [place] [address]  CBC     Status: Abnormal   Collection Time: 04/07/18  6:14 AM  Result Value Ref Range   WBC 6.4 4.0 - 10.5 K/uL   RBC 3.94 (L) 4.22 - 5.81 MIL/uL   Hemoglobin 10.7 (L) 13.0 - 17.0 g/dL   HCT 34.2 (L) 39.0 - 52.0 %   MCV 86.8 78.0 - 100.0 fL   MCH 27.2 26.0 - 34.0 pg   MCHC 31.3 30.0 - Clifford.0 g/dL   RDW 15.0 11.5 - 15.5 %   Platelets 104 (L) 150 - 400 K/uL    Comment: SPECIMEN CHECKED FOR CLOTS CONSISTENT WITH PREVIOUS RESULT Performed at [facility], [address] [place], [place] [address]     Dg Chest 2 View  Result Date: 04/05/2018 CLINICAL DATA:  Cough and weakness x1 month EXAM: CHEST - 2 VIEW COMPARISON:  None. FINDINGS:  AP upright view of the chest. Borderline cardiomegaly. Mild uncoiling of the thoracic aorta without aneurysm. Minimal bibasilar atelectasis. No pulmonary edema or pulmonary consolidation. No acute osseous abnormality. Mild degenerative change about both AC joints. IMPRESSION: No active cardiopulmonary disease. Electronically Signed   By: Ashley Royalty M.D.   On: 04/05/2018 17:04    ROS:  Pertinent items are noted in HPI.  Blood pressure (!) 144/85, pulse 89, temperature (!) 100.4 F (38 C), temperature source Oral, resp. rate 16, height '5\' 5"'$  (1.651 m), weight 147 lb 14.9 oz (67.1 kg), SpO2 95 %. Physical Exam: Pleasant well-developed well-nourished [name] no acute distress Head is normocephalic, atraumatic Lungs clear to auscultation with equal breath sounds bilaterally Heart examination reveals regular rate and rhythm without S3, S4, murmurs Rectal examination was deferred at this time due to his recent colonoscopy  Will review colonoscopy pictures.  Report reviewed. Assessment/Plan: Impression: Bleeding hemorrhoidal disease with overlying telangiectasias, history of alcoholism, status post multiple blood transfusions Plan: Will  schedule for extensive hemorrhoidectomy on 04/09/2018.  The risks and benefits of the procedure including bleeding, infection, and recurrence of the hemorrhoidal disease were fully explained to the patient, who gave informed consent.  Aviva Signs 04/07/2018, 9:43 AM

## 2018-04-08 DIAGNOSIS — D696 Thrombocytopenia, unspecified: Secondary | ICD-10-CM

## 2018-04-08 LAB — CBC
HEMATOCRIT: 33.7 % — AB (ref 39.0–52.0)
HEMOGLOBIN: 10.6 g/dL — AB (ref 13.0–17.0)
MCH: 27.3 pg (ref 26.0–34.0)
MCHC: 31.5 g/dL (ref 30.0–36.0)
MCV: 86.9 fL (ref 78.0–100.0)
Platelets: 94 10*3/uL — ABNORMAL LOW (ref 150–400)
RBC: 3.88 MIL/uL — ABNORMAL LOW (ref 4.22–5.81)
RDW: 15.1 % (ref 11.5–15.5)
WBC: 7.3 10*3/uL (ref 4.0–10.5)

## 2018-04-08 LAB — COMPREHENSIVE METABOLIC PANEL
ALT: 23 U/L (ref 17–63)
AST: 55 U/L — AB (ref 15–41)
Albumin: 3.5 g/dL (ref 3.5–5.0)
Alkaline Phosphatase: 66 U/L (ref 38–126)
Anion gap: 11 (ref 5–15)
BUN: 7 mg/dL (ref 6–20)
CHLORIDE: 100 mmol/L — AB (ref 101–111)
CO2: 25 mmol/L (ref 22–32)
Calcium: 8.7 mg/dL — ABNORMAL LOW (ref 8.9–10.3)
Creatinine, Ser: 0.81 mg/dL (ref 0.61–1.24)
Glucose, Bld: 119 mg/dL — ABNORMAL HIGH (ref 65–99)
POTASSIUM: 3.4 mmol/L — AB (ref 3.5–5.1)
Sodium: 136 mmol/L (ref 135–145)
Total Bilirubin: 1.4 mg/dL — ABNORMAL HIGH (ref 0.3–1.2)
Total Protein: 7.2 g/dL (ref 6.5–8.1)

## 2018-04-08 LAB — MAGNESIUM: MAGNESIUM: 1 mg/dL — AB (ref 1.7–2.4)

## 2018-04-08 MED ORDER — POTASSIUM CHLORIDE CRYS ER 20 MEQ PO TBCR
40.0000 meq | EXTENDED_RELEASE_TABLET | Freq: Once | ORAL | Status: AC
  Administered 2018-04-08: 40 meq via ORAL
  Filled 2018-04-08: qty 2

## 2018-04-08 MED ORDER — CHLORHEXIDINE GLUCONATE CLOTH 2 % EX PADS
6.0000 | MEDICATED_PAD | Freq: Once | CUTANEOUS | Status: AC
  Administered 2018-04-09: 6 via TOPICAL

## 2018-04-08 MED ORDER — POTASSIUM CHLORIDE IN NACL 20-0.9 MEQ/L-% IV SOLN
INTRAVENOUS | Status: DC
  Administered 2018-04-08: 18:00:00 via INTRAVENOUS

## 2018-04-08 MED ORDER — CHLORHEXIDINE GLUCONATE CLOTH 2 % EX PADS
6.0000 | MEDICATED_PAD | Freq: Once | CUTANEOUS | Status: AC
  Administered 2018-04-08: 6 via TOPICAL

## 2018-04-08 NOTE — Progress Notes (Signed)
PROGRESS NOTE  Clifford [name] EQA:[id number] DOB: 1951/10/04 DOA: 04/05/2018 PCP: [name], FNP  Brief History:  67 year old male with a history of hyperlipidemia, hypertension, seizure disorder, prostate cancer presenting with 1 week history of intermittent hematochezia that was becoming more frequent.  The patient was also complaining of some dizziness and lightheadedness.  The patient underwent a flexible sigmoidoscopy on 01/10/2018 for rectal bleeding.  He was found to have diverticulosis, active bleeding in the distal rectum from multiple sites.  Hemospray was utilized to control bleeding.  Upon presentation, the patient was found to have a hemoglobin 6.9.  He was transfused 2 units PRBC.  GI was consulted to assist with management.  Assessment/Plan: Acute blood loss anemia -Secondary to lower GI bleed -appreciate GI follow up -04/06/18 colonoscopy--sigmoid diverticulosis, mid sigmoid polyp, multiple large nonbleeding angioiectasias in the distal rectum -tranfused 2 units PRBCs for the admit -Hgb stable since transfusion -bleeding has subsided -appreciate general surgery consult--plans for hemorrhoidectomy 04/09/18  Lower GI bleed -Secondary to angiectasia's and internal hemorrhoids -Hemorrhoidectomy planned 04/01/2018 -CBC in a.m.  Essential hypertension -Holding amlodipine initially secondary to soft blood pressure -Blood pressure remains controlled -Continue to monitor  Prostate Cancer -[facility]ed [facility] -Last Lupron injection August 2018 Underwent 22 rounds of radiation and finished in August of last  year in [place].  Thrombocytopenia -This has been chronic -Likely due to alcohol use  -serum B12 -HIV neg -Hepatitis B surface antigen -Hepatitis C antibody  Fever -Spiked a temperature of 100.4 F early a.m. 04/07/2018 -Blood cultures negative -Urinalysis negative for pyuria -Chest x-ray negative for infiltrates -Patient remains  hemodynamically stable -Remain off antibiotics  Hypokalemia -Replete -Add potassium and IV fluid -Check magnesium   Disposition Plan:   Home when cleared by general surgery Family Communication:   Family at bedside  Consultants:    Code Status:  FULL / DNR  DVT Prophylaxis:  [place] Heparin / [place] Lovenox   Procedures: As Listed in Progress Note Above  Antibiotics: None    Subjective: Patient states that he has not had any more hematochezia in the last 24 hours.  He denies any dizziness, chest pain, shortness breath, abdominal pain, dysuria, hematuria.  There is no melena.  Objective: Vitals:   04/07/18 1317 04/07/18 2105 04/08/18 0429 04/08/18 1312  BP: (!) 151/90 138/88 (!) 141/85 132/83  Pulse: 93 91 89 88  Resp: 19 19 18    Temp: 98.6 F (37 C) 98.5 F (36.9 C) 99.6 F (37.6 C) 99.3 F (37.4 C)  TempSrc: Oral Oral Oral Oral  SpO2: 100% 97% 98% 99%  Weight:      Height:        Intake/Output Summary (Last 24 hours) at 04/08/2018 1529 Last data filed at 04/08/2018 1310 Gross per 24 hour  Intake 1197.5 ml  Output 2250 ml  Net -1052.5 ml   Weight change:  Exam:   General:  Pt is alert, follows commands appropriately, not in acute distress  HEENT: No icterus, No thrush, No neck mass, [place]/AT  Cardiovascular: RRR, S1/S2, no rubs, no gallops  Respiratory: CTA bilaterally, no wheezing, no crackles, no rhonchi  Abdomen: Soft/+BS, non tender, non distended, no guarding  Extremities: No edema, No lymphangitis, No petechiae, No rashes, no synovitis   Data Reviewed: I have personally reviewed following labs and imaging studies Basic Metabolic Panel: Recent Labs  Lab 04/05/18 1539 04/08/18 0603  NA 131* 136  K 4.2 3.4*  CL 96* 100*  CO2 20* 25  GLUCOSE 95 119*  BUN 14 7  CREATININE 1.38* 0.81  CALCIUM 8.1* 8.7*  MG  --  1.0*   Liver Function Tests: Recent Labs  Lab 04/05/18 1605 04/08/18 0603  AST 110* 55*  ALT 32 23  ALKPHOS 64 66  BILITOT 0.9  1.4*  PROT 7.1 7.2  ALBUMIN 3.7 3.5   No results for input(s): LIPASE, AMYLASE in the last 168 hours. No results for input(s): AMMONIA in the last 168 hours. Coagulation Profile: Recent Labs  Lab 04/06/18 1124  INR 1.01   CBC: Recent Labs  Lab 04/04/18 1104 04/05/18 1539 04/06/18 0426 04/07/18 0614 04/08/18 0603  WBC  --  5.6 6.2 6.4 7.3  NEUTROABS  --  3.4  --   --   --   HGB 6.9* 7.1* 10.5* 10.7* 10.6*  HCT 20.7* 22.9* 33.4* 34.2* 33.7*  MCV  --  85.1 87.0 86.8 86.9  PLT  --  113* 105* 104* 94*   Cardiac Enzymes: No results for input(s): CKTOTAL, CKMB, CKMBINDEX, TROPONINI in the last 168 hours. BNP: Invalid input(s): POCBNP CBG: No results for input(s): GLUCAP in the last 168 hours. HbA1C: No results for input(s): HGBA1C in the last 72 hours. Urine analysis:    Component Value Date/Time   COLORURINE YELLOW 04/07/2018 [place] 04/07/2018 1015   LABSPEC 1.008 04/07/2018 1015   PHURINE 7.0 04/07/2018 1015   GLUCOSEU NEGATIVE 04/07/2018 1015   HGBUR SMALL (A) 04/07/2018 1015   BILIRUBINUR NEGATIVE 04/07/2018 1015   KETONESUR 5 (A) 04/07/2018 1015   PROTEINUR 30 (A) 04/07/2018 1015   NITRITE NEGATIVE 04/07/2018 1015   LEUKOCYTESUR NEGATIVE 04/07/2018 1015   Sepsis Labs: @LABRCNTIP (procalcitonin:4,lacticidven:4) ) Recent Results (from the past 240 hour(s))  MRSA PCR Screening     Status: None   Collection Time: 04/05/18  8:52 PM  Result Value Ref Range Status   MRSA by PCR NEGATIVE NEGATIVE Final    Comment:        The GeneXpert MRSA Assay (FDA approved for NASAL specimens only), is one component of a comprehensive MRSA colonization surveillance program. It is not intended to diagnose MRSA infection nor to guide or monitor treatment for MRSA infections. Performed at [facility], [address], [place], [place] [address]   Culture, blood (Routine X 2) w Reflex to ID Panel     Status: None (Preliminary result)   Collection Time:  04/07/18  8:56 AM  Result Value Ref Range Status   Specimen Description BLOOD RIGHT HAND  Final   Special Requests   Final    BOTTLES DRAWN AEROBIC AND ANAEROBIC Blood Culture adequate volume   Culture   Final    NO GROWTH < 24 HOURS Performed at [facility], [address], [place], [place] [address]    Report Status PENDING  Incomplete  Culture, blood (Routine X 2) w Reflex to ID Panel     Status: None (Preliminary result)   Collection Time: 04/07/18  9:02 AM  Result Value Ref Range Status   Specimen Description BLOOD RIGHT ARM  Final   Special Requests   Final    BOTTLES DRAWN AEROBIC AND ANAEROBIC Blood Culture adequate volume   Culture   Final    NO GROWTH < 24 HOURS Performed at [facility], [address] [place],  [address]    Report Status PENDING  Incomplete     Scheduled Meds: . Chlorhexidine Gluconate Cloth  6  each Topical Once   And  . Chlorhexidine Gluconate Cloth  6 each Topical Once  . docusate sodium  100 mg Oral BID  . loratadine  10 mg Oral Daily  . multivitamin with minerals  1 tablet Oral Daily  . pantoprazole  40 mg Oral Daily   Continuous Infusions: . sodium chloride 50 mL/hr at 04/07/18 1941    Procedures/Studies: Dg Chest 2 View  Result Date: 04/05/2018 CLINICAL DATA:  Cough and weakness x1 month EXAM: CHEST - 2 VIEW COMPARISON:  None. FINDINGS: AP upright view of the chest. Borderline cardiomegaly. Mild uncoiling of the thoracic aorta without aneurysm. Minimal bibasilar atelectasis. No pulmonary edema or pulmonary consolidation. No acute osseous abnormality. Mild degenerative change about both AC joints. IMPRESSION: No active cardiopulmonary disease. Electronically Signed   By: Ashley Royalty M.D.   On: 04/05/2018 17:04    Orson Eva, DO  [facility] Pager [phone]  If 7PM-7AM, please contact night-coverage [email or web address] Password [email or web address] 04/08/2018, 3:29 PM   LOS: 2 days

## 2018-04-08 NOTE — Progress Notes (Signed)
2 Days Post-Op  Subjective: Patient denies any blood per rectum.  Objective: Vital signs in last 24 hours: Temp:  [98.5 F (36.9 C)-99.6 F (37.6 C)] 99.6 F (37.6 C) (05/27 0429) Pulse Rate:  [89-93] 89 (05/27 0429) Resp:  [18-19] 18 (05/27 0429) BP: (138-151)/(85-90) 141/85 (05/27 0429) SpO2:  [97 %-100 %] 98 % (05/27 0429) Last BM Date: 04/07/18  Intake/Output from previous day: 05/26 0701 - 05/27 0700 In: 1917.5 [P.O.:1200; I.V.:717.5] Out: 1800 [Urine:1800] Intake/Output this shift: No intake/output data recorded.  General appearance: alert, cooperative and no distress Resp: clear to auscultation bilaterally Cardio: regular rate and rhythm, S1, S2 normal, no murmur, click, rub or gallop  Lab Results:  Recent Labs    04/07/18 0614 04/08/18 0603  WBC 6.4 7.3  HGB 10.7* 10.6*  HCT 34.2* 33.7*  PLT 104* 94*   BMET Recent Labs    04/05/18 1539 04/08/18 0603  NA 131* 136  K 4.2 3.4*  CL 96* 100*  CO2 20* 25  GLUCOSE 95 119*  BUN 14 7  CREATININE 1.38* 0.81  CALCIUM 8.1* 8.7*   PT/INR Recent Labs    04/06/18 1124  LABPROT 13.2  INR 1.01    Studies/Results: No results found.  Anti-infectives: Anti-infectives (From admission, onward)   None      Assessment/Plan: s/p Procedure(s): COLONOSCOPY POLYPECTOMY Impression: Bleeding hemorrhoidal disease Plan: Patient will undergo extensive hemorrhoidectomy tomorrow.  The risks and benefits of the procedure including bleeding, infection, and recurrence of the hemorrhoidal disease were fully explained to the patient, who gave informed consent.  LOS: 2 days    Aviva Signs 04/08/2018

## 2018-04-08 NOTE — Progress Notes (Signed)
  Subjective:  Patient has no complaints.  He has good appetite.  He did have a bowel movement yesterday when he passed soft stool but no blood.  He denies abdominal pain.  He does not feel nervous or jittery.  Objective: Blood pressure (!) 141/85, pulse 89, temperature 99.6 F (37.6 C), temperature source Oral, resp. rate 18, height 5\' 5"  (1.651 m), weight 147 lb 14.9 oz (67.1 kg), SpO2 98 %. Patient is alert and he does not have tremors. Abdomen is soft and nontender with organomegaly or masses. No LE edema noted.  Labs/studies Results:  Recent Labs    2018-04-26 0426 04/07/18 0614 04/08/18 0603  WBC 6.2 6.4 7.3  HGB 10.5* 10.7* 10.6*  HCT 33.4* 34.2* 33.7*  PLT 105* 104* 94*    BMET  Recent Labs    04/05/18 1539 04/08/18 0603  NA 131* 136  K 4.2 3.4*  CL 96* 100*  CO2 20* 25  GLUCOSE 95 119*  BUN 14 7  CREATININE 1.38* 0.81  CALCIUM 8.1* 8.7*    LFT  Recent Labs    04/05/18 1605 04/08/18 0603  PROT 7.1 7.2  ALBUMIN 3.7 3.5  AST 110* 55*  ALT 32 23  ALKPHOS 64 66  BILITOT 0.9 1.4*  BILIDIR 0.2  --   IBILI 0.7  --     PT/INR  Recent Labs    April 26, 2018 1124  LABPROT 13.2  INR 1.01      Assessment:  #1.  Lower GI bleed secondary to internal hemorrhoids and extensive telangiectasia overlying these hemorrhoids noted on colonoscopy 2 days ago.  Patient has not bled anymore but he is at increased risk of bleeding.  Dr. Arnoldo Morale has seen patient hemorrhoidectomy is planned for tomorrow.  #2.  Acute on chronic anemia secondary to rectal bleeding.  Patient has received 2 units of PRBCs.  #3.  Elevated AST with normal ALT.  AST has decreased significantly since last checked.  I missed AST elevation on admission thinking that was alkaline phosphatase.  His transaminases are mildly elevated back in 2017 when he also had an ultrasound revealing fatty liver but no changes of cirrhosis. Hepatitis discriminant function 6.42 based on labs from 04/05/2018.  For he does not  have severe alcoholic liver disease. Will screen for hepatitis B and C.  #4. Thrombocytopenia most likely secondary to toxic effect of alcohol on bone marrow.  He does not have stigmata of chronic liver disease such as splenomegaly.   Recommendations:  LFTs and INR in a.m. Hepatitis B surface antigen and HCV antibody. Dr. Arnoldo Morale can proceed with surgery unless there is significant change in transaminases or INR.

## 2018-04-08 NOTE — H&P (View-Only) (Signed)
2 Days Post-Op  Subjective: Patient denies any blood per rectum.  Objective: Vital signs in last 24 hours: Temp:  [98.5 F (36.9 C)-99.6 F (37.6 C)] 99.6 F (37.6 C) (05/27 0429) Pulse Rate:  [89-93] 89 (05/27 0429) Resp:  [18-19] 18 (05/27 0429) BP: (138-151)/(85-90) 141/85 (05/27 0429) SpO2:  [97 %-100 %] 98 % (05/27 0429) Last BM Date: 04/07/18  Intake/Output from previous day: 05/26 0701 - 05/27 0700 In: 1917.5 [P.O.:1200; I.V.:717.5] Out: 1800 [Urine:1800] Intake/Output this shift: No intake/output data recorded.  General appearance: alert, cooperative and no distress Resp: clear to auscultation bilaterally Cardio: regular rate and rhythm, S1, S2 normal, no murmur, click, rub or gallop  Lab Results:  Recent Labs    04/07/18 0614 04/08/18 0603  WBC 6.4 7.3  HGB 10.7* 10.6*  HCT 34.2* 33.7*  PLT 104* 94*   BMET Recent Labs    04/05/18 1539 04/08/18 0603  NA 131* 136  K 4.2 3.4*  CL 96* 100*  CO2 20* 25  GLUCOSE 95 119*  BUN 14 7  CREATININE 1.38* 0.81  CALCIUM 8.1* 8.7*   PT/INR Recent Labs    04/06/18 1124  LABPROT 13.2  INR 1.01    Studies/Results: No results found.  Anti-infectives: Anti-infectives (From admission, onward)   None      Assessment/Plan: s/p Procedure(s): COLONOSCOPY POLYPECTOMY Impression: Bleeding hemorrhoidal disease Plan: Patient will undergo extensive hemorrhoidectomy tomorrow.  The risks and benefits of the procedure including bleeding, infection, and recurrence of the hemorrhoidal disease were fully explained to the patient, who gave informed consent.  LOS: 2 days    Aviva Signs 04/08/2018

## 2018-04-09 ENCOUNTER — Inpatient Hospital Stay (HOSPITAL_COMMUNITY): Payer: Medicare Other | Admitting: Anesthesiology

## 2018-04-09 ENCOUNTER — Encounter (HOSPITAL_COMMUNITY)
Admission: EM | Disposition: A | Discharge status: Home / Self Care | Payer: Self-pay | Source: Home / Self Care | Attending: Internal Medicine

## 2018-04-09 ENCOUNTER — Encounter (HOSPITAL_COMMUNITY): Payer: Self-pay | Admitting: Anesthesiology

## 2018-04-09 DIAGNOSIS — K649 Unspecified hemorrhoids: Secondary | ICD-10-CM

## 2018-04-09 HISTORY — PX: HEMORRHOID SURGERY: SHX153

## 2018-04-09 LAB — HEPATIC FUNCTION PANEL
ALBUMIN: 3.3 g/dL — AB (ref 3.5–5.0)
ALK PHOS: 61 U/L (ref 38–126)
ALT: 22 U/L (ref 17–63)
AST: 52 U/L — AB (ref 15–41)
BILIRUBIN TOTAL: 1.1 mg/dL (ref 0.3–1.2)
Bilirubin, Direct: 0.3 mg/dL (ref 0.1–0.5)
Indirect Bilirubin: 0.8 mg/dL (ref 0.3–0.9)
Total Protein: 7.1 g/dL (ref 6.5–8.1)

## 2018-04-09 LAB — BPAM RBC
BLOOD PRODUCT EXPIRATION DATE: 201906132359
Blood Product Expiration Date: 201906062359
Blood Product Expiration Date: 201906132359
ISSUE DATE / TIME: 201905242150
ISSUE DATE / TIME: 201905250022
Unit Type and Rh: 6200
Unit Type and Rh: 6200
Unit Type and Rh: 6200

## 2018-04-09 LAB — TYPE AND SCREEN
ABO/RH(D): A POS
ANTIBODY SCREEN: NEGATIVE
UNIT DIVISION: 0
Unit division: 0
Unit division: 0

## 2018-04-09 LAB — PROTIME-INR
INR: 1.07
Prothrombin Time: 13.9 seconds (ref 11.4–15.2)

## 2018-04-09 LAB — MAGNESIUM: MAGNESIUM: 1 mg/dL — AB (ref 1.7–2.4)

## 2018-04-09 SURGERY — HEMORRHOIDECTOMY
Anesthesia: General | Site: Rectum

## 2018-04-09 MED ORDER — LIDOCAINE VISCOUS HCL 2 % MT SOLN
OROMUCOSAL | Status: AC
  Filled 2018-04-09: qty 15

## 2018-04-09 MED ORDER — SODIUM CHLORIDE 0.9 % IV SOLN
1.0000 g | INTRAVENOUS | Status: AC
  Administered 2018-04-09: 1 g via INTRAVENOUS
  Filled 2018-04-09 (×2): qty 1

## 2018-04-09 MED ORDER — HYDROCODONE-ACETAMINOPHEN 5-325 MG PO TABS: 1.0000 | ORAL_TABLET | ORAL | 0 refills | Status: DC | PRN

## 2018-04-09 MED ORDER — LIDOCAINE HCL (PF) 1 % IJ SOLN
INTRAMUSCULAR | Status: AC
  Filled 2018-04-09: qty 5

## 2018-04-09 MED ORDER — LIDOCAINE HCL (CARDIAC) PF 100 MG/5ML IV SOSY
PREFILLED_SYRINGE | INTRAVENOUS | Status: DC | PRN
  Administered 2018-04-09: 40 mg via INTRAVENOUS

## 2018-04-09 MED ORDER — SODIUM CHLORIDE 0.9 % IR SOLN
Status: DC | PRN
  Administered 2018-04-09: 1000 mL

## 2018-04-09 MED ORDER — FENTANYL CITRATE (PF) 100 MCG/2ML IJ SOLN
25.0000 ug | INTRAMUSCULAR | Status: DC | PRN
Start: 2018-04-09 — End: 2018-04-09

## 2018-04-09 MED ORDER — LIDOCAINE VISCOUS HCL 2 % MT SOLN
OROMUCOSAL | Status: DC | PRN
  Administered 2018-04-09: 1 via OROMUCOSAL

## 2018-04-09 MED ORDER — KETOROLAC TROMETHAMINE 30 MG/ML IJ SOLN
30.0000 mg | Freq: Once | INTRAMUSCULAR | Status: AC
  Administered 2018-04-09: 30 mg via INTRAVENOUS

## 2018-04-09 MED ORDER — HYDROCODONE-ACETAMINOPHEN 5-325 MG PO TABS
1.0000 | ORAL_TABLET | ORAL | Status: DC | PRN
  Administered 2018-04-09: 2 via ORAL
  Filled 2018-04-09: qty 2

## 2018-04-09 MED ORDER — PHENYLEPHRINE 40 MCG/ML (10ML) SYRINGE FOR IV PUSH (FOR BLOOD PRESSURE SUPPORT)
PREFILLED_SYRINGE | INTRAVENOUS | Status: AC
  Filled 2018-04-09: qty 10

## 2018-04-09 MED ORDER — ONDANSETRON HCL 4 MG/2ML IJ SOLN
INTRAMUSCULAR | Status: AC
  Filled 2018-04-09: qty 2

## 2018-04-09 MED ORDER — BUPIVACAINE LIPOSOME 1.3 % IJ SUSP
INTRAMUSCULAR | Status: AC
  Filled 2018-04-09: qty 20

## 2018-04-09 MED ORDER — LACTATED RINGERS IV SOLN
INTRAVENOUS | Status: DC | PRN
  Administered 2018-04-09: 07:00:00 via INTRAVENOUS

## 2018-04-09 MED ORDER — ONDANSETRON HCL 4 MG/2ML IJ SOLN
INTRAMUSCULAR | Status: DC | PRN
  Administered 2018-04-09: 4 mg via INTRAVENOUS

## 2018-04-09 MED ORDER — HYDROCODONE-ACETAMINOPHEN 7.5-325 MG PO TABS: 1.0000 | ORAL_TABLET | Freq: Once | ORAL | Status: DC | PRN

## 2018-04-09 MED ORDER — KETOROLAC TROMETHAMINE 30 MG/ML IJ SOLN
INTRAMUSCULAR | Status: AC
  Filled 2018-04-09: qty 1

## 2018-04-09 MED ORDER — MIDAZOLAM HCL 2 MG/2ML IJ SOLN
INTRAMUSCULAR | Status: AC
  Filled 2018-04-09: qty 2

## 2018-04-09 MED ORDER — MIDAZOLAM HCL 5 MG/5ML IJ SOLN
INTRAMUSCULAR | Status: DC | PRN
  Administered 2018-04-09 (×2): 1 mg via INTRAVENOUS

## 2018-04-09 MED ORDER — FENTANYL CITRATE (PF) 100 MCG/2ML IJ SOLN
INTRAMUSCULAR | Status: DC | PRN
  Administered 2018-04-09: 50 ug via INTRAVENOUS

## 2018-04-09 MED ORDER — MORPHINE SULFATE (PF) 2 MG/ML IV SOLN: 2.0000 mg | INTRAVENOUS | Status: DC | PRN

## 2018-04-09 MED ORDER — PROPOFOL 10 MG/ML IV BOLUS
INTRAVENOUS | Status: AC
  Filled 2018-04-09: qty 40

## 2018-04-09 MED ORDER — BUPIVACAINE LIPOSOME 1.3 % IJ SUSP
INTRAMUSCULAR | Status: DC | PRN
  Administered 2018-04-09: 20 mL

## 2018-04-09 MED ORDER — FENTANYL CITRATE (PF) 250 MCG/5ML IJ SOLN
INTRAMUSCULAR | Status: AC
  Filled 2018-04-09: qty 5

## 2018-04-09 MED ORDER — PROPOFOL 10 MG/ML IV BOLUS
INTRAVENOUS | Status: DC | PRN
  Administered 2018-04-09: 150 mg via INTRAVENOUS
  Administered 2018-04-09: 30 mg via INTRAVENOUS

## 2018-04-09 MED ORDER — SUCCINYLCHOLINE CHLORIDE 20 MG/ML IJ SOLN
INTRAMUSCULAR | Status: AC
  Filled 2018-04-09: qty 1

## 2018-04-09 SURGICAL SUPPLY — 27 items
CLOTH BEACON ORANGE TIMEOUT ST (SAFETY) ×3 IMPLANT
COVER LIGHT HANDLE STERIS (MISCELLANEOUS) ×6 IMPLANT
DRAPE HALF SHEET 40X57 (DRAPES) ×3 IMPLANT
DRAPE PROXIMA HALF (DRAPES) ×3 IMPLANT
ELECT REM PT RETURN 9FT ADLT (ELECTROSURGICAL) ×3
ELECTRODE REM PT RTRN 9FT ADLT (ELECTROSURGICAL) ×1 IMPLANT
GAUZE SPONGE 4X4 12PLY STRL (GAUZE/BANDAGES/DRESSINGS) ×4 IMPLANT
GLOVE BIO SURGEON STRL SZ7 (GLOVE) ×2 IMPLANT
GLOVE BIOGEL PI IND STRL 7.0 (GLOVE) ×1 IMPLANT
GLOVE BIOGEL PI INDICATOR 7.0 (GLOVE) ×4
GLOVE SURG SS PI 7.5 STRL IVOR (GLOVE) ×3 IMPLANT
GOWN STRL REUS W/ TWL XL LVL3 (GOWN DISPOSABLE) ×1 IMPLANT
GOWN STRL REUS W/TWL LRG LVL3 (GOWN DISPOSABLE) ×3 IMPLANT
GOWN STRL REUS W/TWL XL LVL3 (GOWN DISPOSABLE) ×2
HEMOSTAT SURGICEL 4X8 (HEMOSTASIS) ×3 IMPLANT
KIT TURNOVER CYSTO (KITS) ×3 IMPLANT
LIGASURE IMPACT 36 18CM CVD LR (INSTRUMENTS) ×3 IMPLANT
MANIFOLD NEPTUNE II (INSTRUMENTS) ×3 IMPLANT
NEEDLE HYPO 22GX1.5 SAFETY (NEEDLE) ×3 IMPLANT
NS IRRIG 1000ML POUR BTL (IV SOLUTION) ×3 IMPLANT
PACK PERI GYN (CUSTOM PROCEDURE TRAY) ×3 IMPLANT
PAD ARMBOARD 7.5X6 YLW CONV (MISCELLANEOUS) ×3 IMPLANT
SET BASIN LINEN APH (SET/KITS/TRAYS/PACK) ×3 IMPLANT
SURGILUBE 3G PEEL PACK STRL (MISCELLANEOUS) ×3 IMPLANT
SUT SILK 0 FSL (SUTURE) ×3 IMPLANT
SUT VIC AB 2-0 CT2 27 (SUTURE) ×3 IMPLANT
SYR 20CC LL (SYRINGE) ×2 IMPLANT

## 2018-04-09 NOTE — Progress Notes (Signed)
IV removed, WNL. D/c instructions given to pt and family. Verbalized understanding. Respiratory taught patient how to use incentive spirometer upon home use.

## 2018-04-09 NOTE — Addendum Note (Signed)
Addendum  created 04/09/18 0904 by Vista Deck, CRNA   Sign clinical note

## 2018-04-09 NOTE — Anesthesia Procedure Notes (Signed)
Procedure Name: LMA Insertion Date/Time: 04/09/2018 7:49 AM Performed by: Andree Elk, Labrina Lines A, CRNA Pre-anesthesia Checklist: Patient identified, Timeout performed, Emergency Drugs available, Suction available and Patient being monitored Patient Re-evaluated:Patient Re-evaluated prior to induction Oxygen Delivery Method: Circle system utilized Ventilation: Mask ventilation without difficulty LMA: LMA inserted LMA Size: 4.0 Number of attempts: 1 Placement Confirmation: positive ETCO2 and breath sounds checked- equal and bilateral Tube secured with: Tape Dental Injury: Teeth and Oropharynx as per pre-operative assessment

## 2018-04-09 NOTE — Discharge Instructions (Signed)
Surgical Procedures for Hemorrhoids, Care After °Refer to this sheet in the next few weeks. These instructions provide you with information about caring for yourself after your procedure. Your health care provider may also give you more specific instructions. Your treatment has been planned according to current medical practices, but problems sometimes occur. Call your health care provider if you have any problems or questions after your procedure. °What can I expect after the procedure? °After the procedure, it is common to have: °· Rectal pain. °· Pain when you are having a bowel movement. °· Slight rectal bleeding. ° °Follow these instructions at home: °Medicines °· Take over-the-counter and prescription medicines only as told by your health care provider. °· Do not drive or operate heavy machinery while taking prescription pain medicine. °· Use a stool softener or a bulk laxative as told by your health care provider. °Activity °· Rest at home. Return to your normal activities as told by your health care provider. °· Do not lift anything that is heavier than 10 lb (4.5 kg). °· Do not sit for long periods of time. Take a walk every day or as told by your health care provider. °· Do not strain to have a bowel movement. Do not spend a long time sitting on the toilet. °Eating and drinking °· Eat foods that contain fiber, such as whole grains, beans, nuts, fruits, and vegetables. °· Drink enough fluid to keep your urine clear or pale yellow. °General instructions °· Sit in a warm bath 2-3 times per day to relieve soreness or itching. °· Keep all follow-up visits as told by your health care provider. This is important. °Contact a health care provider if: °· Your pain medicine is not helping. °· You have a fever or chills. °· You become constipated. °· You have trouble passing urine. °Get help right away if: °· You have very bad rectal pain. °· You have heavy bleeding from your rectum. °This information is not intended  to replace advice given to you by your health care provider. Make sure you discuss any questions you have with your health care provider. °Document Released: 01/20/2004 Document Revised: 04/06/2016 Document Reviewed: 01/25/2015 °Elsevier Interactive Patient Education © 2018 [employer] ° °

## 2018-04-09 NOTE — Progress Notes (Signed)
Patient in day hospital. Labs noted. INR 1.07 Bilirubin 1.1 AST 52 and ALT 22 with albumin of 3.3.

## 2018-04-09 NOTE — Op Note (Signed)
Patient:  Clifford [name]  DOB:  [birth year]  MRN:  [id number]   Preop Diagnosis: Bleeding hemorrhoidal disease  Postop Diagnosis: Same  Procedure: Internal and external hemorrhoidectomy, 3 columns  Surgeon: Aviva Signs, MD  Anes: General  Indications: Patient is a 67 year old black male who presents with bleeding hemorrhoidal disease causing the need for a blood transfusion.  The risks and benefits of the procedure including bleeding, infection, and the possibility of recurrence of the hemorrhoidal disease were fully explained to the patient, who gave informed consent.  Procedure note: The patient was placed in the lithotomy position after general anesthesia was administered.  The perineum was prepped and draped using the usual sterile technique with Betadine.  Surgical site confirmation was performed.  On anoscopy, the patient had 3 areas of swollen internal hemorrhoids at the 11:00, 9:00, and 2:00 positions.  There was some mucosal prolapse present.  All 3 columns were excised in a Ferguson-like manner using the LigaSure.  Care was taken to avoid the external sphincter mechanism.  The hemorrhoids were sent to pathology for further examination.  No abnormal bleeding was noted at the end of the procedure.  Exparel was instilled into the surrounding perineum.  Surgicel and Viscous Xylocaine rectal packing was then placed.  All tape and needle counts were correct at the end of the procedure.  The patient was awakened and transferred to PACU in stable condition.  Complications: None  EBL: Minimal  Specimen: Hemorrhoids

## 2018-04-09 NOTE — Transfer of Care (Signed)
Immediate Anesthesia Transfer of Care Note  Patient: Clifford [name]  Procedure(s) Performed: HEMORRHOIDECTOMY (N/A Rectum)  Patient Location: PACU  Anesthesia Type:General  Level of Consciousness: awake, oriented and patient cooperative  Airway & Oxygen Therapy: Patient Spontanous Breathing  Post-op Assessment: Report given to RN and Post -op Vital signs reviewed and stable  Post vital signs: Reviewed and stable  Last Vitals:  Vitals Value Taken Time  BP 142/85 04/09/2018  8:30 AM  Temp    Pulse 81 04/09/2018  8:30 AM  Resp 18 04/09/2018  8:30 AM  SpO2 95 % 04/09/2018  8:30 AM  Vitals shown include unvalidated device data.  Last Pain:  Vitals:   04/09/18 0705  TempSrc: Oral  PainSc: 0-No pain      Patients Stated Pain Goal: 5 (16/10/96 0454)  Complications: No apparent anesthesia complications

## 2018-04-09 NOTE — Anesthesia Preprocedure Evaluation (Signed)
Anesthesia Evaluation  Patient identified by MRN, date of birth, ID band Patient awake    Reviewed: Allergy & Precautions, NPO status , Patient's Chart, lab work & pertinent test results  Airway Mallampati: II  TM Distance: >3 FB Neck ROM: Full    Dental no notable dental hx.    Pulmonary neg pulmonary ROS,    Pulmonary exam normal breath sounds clear to auscultation       Cardiovascular Exercise Tolerance: Poor hypertension, Pt. on medications negative cardio ROS Normal cardiovascular exam Rhythm:Regular Rate:Normal     Neuro/Psych Seizures -,  PSYCHIATRIC DISORDERS Poor historian - denied Sz although + in chart  negative neurological ROS  negative psych ROS   GI/Hepatic negative GI ROS, Neg liver ROS,   Endo/Other  negative endocrine ROS  Renal/GU negative Renal ROS  negative genitourinary   Musculoskeletal negative musculoskeletal ROS (+)   Abdominal   Peds negative pediatric ROS (+)  Hematology negative hematology ROS (+) anemia ,   Anesthesia Other Findings   Reproductive/Obstetrics negative OB ROS                             Anesthesia Physical Anesthesia Plan  ASA: II  Anesthesia Plan: General   Post-op Pain Management:    Induction: Intravenous  PONV Risk Score and Plan:   Airway Management Planned: LMA  Additional Equipment:   Intra-op Plan:   Post-operative Plan: Extubation in OR  Informed Consent: I have reviewed the patients History and Physical, chart, labs and discussed the procedure including the risks, benefits and alternatives for the proposed anesthesia with the patient or authorized representative who has indicated his/her understanding and acceptance.     Plan Discussed with: CRNA  Anesthesia Plan Comments:         Anesthesia Quick Evaluation

## 2018-04-09 NOTE — Interval H&P Note (Signed)
History and Physical Interval Note:  04/09/2018 7:12 AM  Clifford [name]  has presented today for surgery, with the diagnosis of bleeding hemorrhoidal disease  The various methods of treatment have been discussed with the patient and family. After consideration of risks, benefits and other options for treatment, the patient has consented to  Procedure(s): HEMORRHOIDECTOMY (N/A) as a surgical intervention .  The patient's history has been reviewed, patient examined, no change in status, stable for surgery.  I have reviewed the patient's chart and labs.  Questions were answered to the patient's satisfaction.     Aviva Signs

## 2018-04-09 NOTE — Care Management Important Message (Signed)
Important Message  Patient Details  Name: Clifford [name] MRN: [id number] Date of Birth: [birth year]   Medicare Important Message Given:  Yes    Sherald Barge, RN 04/09/2018, 11:45 AM

## 2018-04-09 NOTE — Anesthesia Postprocedure Evaluation (Signed)
Anesthesia Post Note  Patient: Clifford [name]  Procedure(s) Performed: HEMORRHOIDECTOMY (N/A Rectum)  Patient location during evaluation: PACU Anesthesia Type: General Level of consciousness: awake and alert and oriented Pain management: pain level controlled Vital Signs Assessment: post-procedure vital signs reviewed and stable Respiratory status: spontaneous breathing Cardiovascular status: stable Postop Assessment: no apparent nausea or vomiting Anesthetic complications: no     Last Vitals:  Vitals:   04/09/18 0705 04/09/18 0830  BP: (!) 155/93 (!) 142/85  Pulse: 85 82  Resp: 18 18  Temp: 37.5 C 37.2 C  SpO2: 98% 98%    Last Pain:  Vitals:   04/09/18 0830  TempSrc:   PainSc: Asleep                 ADAMS, AMY A

## 2018-04-09 NOTE — Discharge Summary (Signed)
Physician Discharge Summary  Patient ID: Clifford [name] MRN: [id number] DOB/AGE: 16-Sep-1951 67 y.o.  Admit date: 04/05/2018 Discharge date: 04/09/2018  Admission Diagnoses: Acute blood loss anemia, bleeding hemorrhoidal disease  Discharge Diagnoses: Same Principal Problem:   Acute blood loss anemia Active Problems:   Essential hypertension   Malignant neoplasm of prostate (HCC)   Acute lower GI bleeding   GI bleed   Bleeding internal hemorrhoids   Fever   Thrombocytopenia (HCC)   Bleeding hemorrhoid   Discharged Condition: good  Hospital Course: Patient is a 67 year old black male who presented to [name] on 04/05/2018 with worsening hematochezia.  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  He was found to be severely anemic and received 2 units of packed red blood cells.  He underwent a colonoscopy by Dr. Laural Golden on 04/06/2018 and was found to have large internal hemorrhoids with telangiectasias present over them.  Surgery was consulted for the hemorrhoids and the patient underwent extensive hemorrhoidectomy of multiple columns on 04/09/2018.  Tolerated procedure well.  His postoperative course has been unremarkable.  His diet was advanced without difficulty.  He is being discharged home on 04/09/2018 in good and improving condition.  Treatments: surgery: Colonoscopy on 04/06/2018 Extensive hemorrhoidectomy on 04/09/2018 Discharge Exam: Blood pressure (!) 158/84, pulse 74, temperature 98.9 F (37.2 C), temperature source Oral, resp. rate 19, height 5\' 5"  (1.651 m), weight 147 lb 14.9 oz (67.1 kg), SpO2 100 %. General appearance: alert, cooperative and no distress Resp: clear to auscultation bilaterally Cardio: regular rate and rhythm, S1, S2 normal, no murmur, click, rub or gallop GI: soft, non-tender; bowel sounds normal; no masses,  no organomegaly  Disposition: Discharge disposition: 01-Home or  Self Care       Discharge Instructions    Diet - low sodium heart healthy   Complete by:  As directed    Increase activity slowly   Complete by:  As directed      Allergies as of 04/09/2018      Reactions   Lisinopril Swelling      Medication List    TAKE these medications   acetaminophen 500 MG tablet Commonly known as:  TYLENOL Take 500 mg by mouth every 6 (six) hours as needed for moderate pain or headache.   amLODipine 10 MG tablet Commonly known as:  NORVASC Take 10 mg by mouth daily.   BENEFIBER DRINK MIX Pack Take 4 g by mouth at bedtime.   HYDROcodone-acetaminophen 5-325 MG tablet Commonly known as:  NORCO Take 1 tablet by mouth every 4 (four) hours as needed for moderate pain.   loratadine 10 MG tablet Commonly known as:  CLARITIN Take 10 mg by mouth daily.   multivitamin with minerals Tabs tablet Take 1 tablet by mouth daily.   pantoprazole 40 MG tablet Commonly known as:  PROTONIX Take 40 mg by mouth daily.      Follow-up Information    Aviva Signs, MD. Schedule an appointment as soon as possible for a visit on 04/16/2018.   Specialty:  General Surgery Contact information: [address] [place] [address] [phone]           Signed: Aviva Signs 04/09/2018, 11:17 AM

## 2018-04-09 NOTE — Anesthesia Postprocedure Evaluation (Signed)
Anesthesia Post Note  Patient: Clifford [name]  Procedure(s) Performed: HEMORRHOIDECTOMY (N/A Rectum)  Patient location during evaluation: PACU Anesthesia Type: General Level of consciousness: awake and alert and patient cooperative Pain management: satisfactory to patient Vital Signs Assessment: post-procedure vital signs reviewed and stable Cardiovascular status: stable Postop Assessment: no apparent nausea or vomiting Anesthetic complications: no     Last Vitals:  Vitals:   04/09/18 0845 04/09/18 0900  BP: (!) 151/87 (!) 158/88  Pulse: 74 73  Resp: 19 17  Temp:    SpO2: 96% 97%    Last Pain:  Vitals:   04/09/18 0900  TempSrc:   PainSc: 0-No pain                 Jamaree Hosier

## 2018-04-09 NOTE — Discharge Summary (Signed)
Physician Discharge Summary  Clifford [name] WER:[id number] DOB: [birth year] DOA: 04/05/2018  PCP: [name], FNP  Admit date: 04/05/2018 Discharge date: 04/09/2018  Admitted From: Home Disposition:  Home  Recommendations for Outpatient Follow-up:  1. Follow up with PCP in 1-2 weeks 2. Please obtain BMP/CBC in one week    Discharge Condition: Stable CODE STATUS: FULL Diet recommendation: Heart Healthy   Brief/Interim Summary:   Discharge Diagnoses:  Acute blood loss anemia -Secondary to lower GI bleed -appreciate GI follow up -04/06/18 colonoscopy--sigmoid diverticulosis, mid sigmoid polyp, multiple large nonbleeding angioiectasias in the distal rectum -tranfused 2 units PRBCs for the admit -Hgb stable since transfusion -bleeding has subsided -appreciate general surgery consult--hemorrhoidectomy 04/09/18  Lower GI bleed -Secondary to angiectasia's and internal hemorrhoids -Hemorrhoidectomy 04/09/2018--Dr. Arnoldo Morale  Essential hypertension -Holding amlodipine initially secondary to soft blood pressure -Blood pressure remains controlled -Continue to monitor  Prostate Cancer -Followed by [facility] -Last Lupron injection August 2018 Underwent 22 rounds of radiation and finished in August oflastyear in [place].  Thrombocytopenia -This has been chronic -Likely due to chronic alcohol use  -serum B12-- -HIV neg -Hepatitis B surface antigen--pending at time of d/c -Hepatitis C antibody--pending at time of d/c  Fever -Spiked a temperature of 100.4 F early a.m. 04/07/2018 -Blood cultures negative -Urinalysis negative for pyuria -Chest x-ray negative for infiltrates -Patient remains hemodynamically stable -Remain off antibiotics -afebrile x 48 hours prior to d/c  Hypokalemia/hypomagnesemia -Repleted -Add potassium and IV fluid  Alcohol dependence -no signs of DTs or withdrawal during the hospitalization    Discharge  Instructions  Discharge Instructions    Diet - low sodium heart healthy   Complete by:  As directed    Increase activity slowly   Complete by:  As directed      Allergies as of 04/09/2018      Reactions   Lisinopril Swelling      Medication List    TAKE these medications   acetaminophen 500 MG tablet Commonly known as:  TYLENOL Take 500 mg by mouth every 6 (six) hours as needed for moderate pain or headache.   amLODipine 10 MG tablet Commonly known as:  NORVASC Take 10 mg by mouth daily.   BENEFIBER DRINK MIX Pack Take 4 g by mouth at bedtime.   HYDROcodone-acetaminophen 5-325 MG tablet Commonly known as:  NORCO Take 1 tablet by mouth every 4 (four) hours as needed for moderate pain.   loratadine 10 MG tablet Commonly known as:  CLARITIN Take 10 mg by mouth daily.   multivitamin with minerals Tabs tablet Take 1 tablet by mouth daily.   pantoprazole 40 MG tablet Commonly known as:  PROTONIX Take 40 mg by mouth daily.      Follow-up Information    Aviva Signs, MD. Schedule an appointment as soon as possible for a visit on 04/16/2018.   Specialty:  General Surgery Contact information: [address] [place] [address] [phone]          Allergies  Allergen Reactions  . Lisinopril Swelling    Consultations:  GI  General surgery   Procedures/Studies: Dg Chest 2 View  Result Date: 04/05/2018 CLINICAL DATA:  Cough and weakness x1 month EXAM: CHEST - 2 VIEW COMPARISON:  None. FINDINGS: AP upright view of the chest. Borderline cardiomegaly. Mild uncoiling of the thoracic aorta without aneurysm. Minimal bibasilar atelectasis. No pulmonary edema or pulmonary consolidation. No acute osseous abnormality. Mild degenerative change about both AC joints. IMPRESSION: No active cardiopulmonary disease. Electronically  Signed   By: Ashley Royalty M.D.   On: 04/05/2018 17:04         Discharge Exam: Vitals:   04/09/18 0918 04/09/18 1305  BP: (!)  158/84 127/86  Pulse: 74 79  Resp: 19 18  Temp: 98.9 F (37.2 C) (!) 97.5 F (36.4 C)  SpO2: 100% 98%   Vitals:   04/09/18 0845 04/09/18 0900 04/09/18 0918 04/09/18 1305  BP: (!) 151/87 (!) 158/88 (!) 158/84 127/86  Pulse: 74 73 74 79  Resp: 19 17 19 18   Temp:   98.9 F (37.2 C) (!) 97.5 F (36.4 C)  TempSrc:   Oral Oral  SpO2: 96% 97% 100% 98%  Weight:      Height:        General: Pt is alert, awake, not in acute distress Cardiovascular: RRR, S1/S2 +, no rubs, no gallops Respiratory: CTA bilaterally, no wheezing, no rhonchi Abdominal: Soft, NT, ND, bowel sounds + Extremities: no edema, no cyanosis   The results of significant diagnostics from this hospitalization (including imaging, microbiology, ancillary and laboratory) are listed below for reference.    Significant Diagnostic Studies: Dg Chest 2 View  Result Date: 04/05/2018 CLINICAL DATA:  Cough and weakness x1 month EXAM: CHEST - 2 VIEW COMPARISON:  None. FINDINGS: AP upright view of the chest. Borderline cardiomegaly. Mild uncoiling of the thoracic aorta without aneurysm. Minimal bibasilar atelectasis. No pulmonary edema or pulmonary consolidation. No acute osseous abnormality. Mild degenerative change about both AC joints. IMPRESSION: No active cardiopulmonary disease. Electronically Signed   By: Ashley Royalty M.D.   On: 04/05/2018 17:04     Microbiology: Recent Results (from the past 240 hour(s))  MRSA PCR Screening     Status: None   Collection Time: 04/05/18  8:52 PM  Result Value Ref Range Status   MRSA by PCR NEGATIVE NEGATIVE Final    Comment:        The GeneXpert MRSA Assay (FDA approved for NASAL specimens only), is one component of a comprehensive MRSA colonization surveillance program. It is not intended to diagnose MRSA infection nor to guide or monitor treatment for MRSA infections. Performed at [facility], [address] [place], [place] [address]   Culture, blood (Routine X 2) w Reflex to  ID Panel     Status: None (Preliminary result)   Collection Time: 04/07/18  8:56 AM  Result Value Ref Range Status   Specimen Description BLOOD RIGHT HAND  Final   Special Requests   Final    BOTTLES DRAWN AEROBIC AND ANAEROBIC Blood Culture adequate volume   Culture   Final    NO GROWTH 2 DAYS Performed at [facility], [address], [place], [place] [address]    Report Status PENDING  Incomplete  Culture, blood (Routine X 2) w Reflex to ID Panel     Status: None (Preliminary result)   Collection Time: 04/07/18  9:02 AM  Result Value Ref Range Status   Specimen Description BLOOD RIGHT ARM  Final   Special Requests   Final    BOTTLES DRAWN AEROBIC AND ANAEROBIC Blood Culture adequate volume   Culture   Final    NO GROWTH 2 DAYS Performed at [facility], [address], [place], [place] [address]    Report Status PENDING  Incomplete     Labs: Basic Metabolic Panel: Recent Labs  Lab 04/05/18 1539 04/08/18 0603 04/09/18 0603  NA 131* 136  --   K 4.2 3.4*  --  CL 96* 100*  --   CO2 20* 25  --   GLUCOSE 95 119*  --   BUN 14 7  --   CREATININE 1.38* 0.81  --   CALCIUM 8.1* 8.7*  --   MG  --  1.0* 1.0*   Liver Function Tests: Recent Labs  Lab 04/05/18 1605 04/08/18 0603 04/09/18 0603  AST 110* 55* 52*  ALT 32 23 22  ALKPHOS 64 66 61  BILITOT 0.9 1.4* 1.1  PROT 7.1 7.2 7.1  ALBUMIN 3.7 3.5 3.3*   No results for input(s): LIPASE, AMYLASE in the last 168 hours. No results for input(s): AMMONIA in the last 168 hours. CBC: Recent Labs  Lab 04/04/18 1104 04/05/18 1539 04/06/18 0426 04/07/18 0614 04/08/18 0603  WBC  --  5.6 6.2 6.4 7.3  NEUTROABS  --  3.4  --   --   --   HGB 6.9* 7.1* 10.5* 10.7* 10.6*  HCT 20.7* 22.9* 33.4* 34.2* 33.7*  MCV  --  85.1 87.0 86.8 86.9  PLT  --  113* 105* 104* 94*   Cardiac Enzymes: No results for input(s): CKTOTAL, CKMB, CKMBINDEX, TROPONINI in the last 168 hours. BNP: Invalid input(s): POCBNP CBG: No results for  input(s): GLUCAP in the last 168 hours.  Time coordinating discharge:  20 minutes  Signed:  Orson Eva, DO [facility] Pager: [phone] 04/09/2018, 5:21 PM

## 2018-04-10 ENCOUNTER — Encounter (HOSPITAL_COMMUNITY): Payer: Self-pay | Admitting: General Surgery

## 2018-04-10 LAB — HEPATITIS B SURFACE ANTIGEN: Hepatitis B Surface Ag: NEGATIVE

## 2018-04-10 LAB — HEPATITIS C ANTIBODY: HCV Ab: <0.1 s/co ratio (ref 0.0–0.9)

## 2018-04-12 ENCOUNTER — Other Ambulatory Visit (INDEPENDENT_AMBULATORY_CARE_PROVIDER_SITE_OTHER): Payer: Self-pay | Admitting: *Deleted

## 2018-04-12 DIAGNOSIS — K648 Other hemorrhoids: Secondary | ICD-10-CM

## 2018-04-12 DIAGNOSIS — K922 Gastrointestinal hemorrhage, unspecified: Secondary | ICD-10-CM

## 2018-04-12 LAB — CULTURE, BLOOD (ROUTINE X 2)
CULTURE: NO GROWTH
CULTURE: NO GROWTH
SPECIAL REQUESTS: ADEQUATE
Special Requests: ADEQUATE

## 2018-04-16 ENCOUNTER — Ambulatory Visit (INDEPENDENT_AMBULATORY_CARE_PROVIDER_SITE_OTHER): Payer: Self-pay | Admitting: General Surgery

## 2018-04-16 ENCOUNTER — Encounter: Payer: Self-pay | Admitting: General Surgery

## 2018-04-16 VITALS — BP 134/76 | HR 80 | Temp 98.6°F | Resp 18 | Wt 148.0 lb

## 2018-04-16 DIAGNOSIS — Z09 Encounter for follow-up examination after completed treatment for conditions other than malignant neoplasm: Secondary | ICD-10-CM

## 2018-04-16 NOTE — Progress Notes (Signed)
Subjective:     Clifford [name]  Status post extensive hemorrhoidectomy.  Patient states he is doing well.  He has not noticed any significant blood per rectum. Objective:    BP 134/76 (BP Location: Left Arm, Patient Position: Sitting, Cuff Size: Normal)   Pulse 80   Temp 98.6 F (37 C) (Temporal)   Resp 18   Wt 148 lb (67.1 kg)   BMI 24.63 kg/m   General:  alert, cooperative and no distress  Rectum healing well.  Minimal blood noted.     Assessment:    Doing well postoperatively.    Plan:   Increase fiber in diet.  Take stool softeners as needed.  Avoid straining when defecating.  Follow-up here as needed.

## 2018-06-06 ENCOUNTER — Inpatient Hospital Stay (HOSPITAL_COMMUNITY)
Admission: EM | Admit: 2018-06-06 | Discharge: 2018-06-09 | DRG: 377 | Disposition: A | Discharge status: Home / Self Care | Payer: Medicare Other | Attending: Internal Medicine | Admitting: Internal Medicine

## 2018-06-06 ENCOUNTER — Encounter (HOSPITAL_COMMUNITY): Payer: Self-pay

## 2018-06-06 ENCOUNTER — Other Ambulatory Visit: Payer: Self-pay

## 2018-06-06 DIAGNOSIS — K627 Radiation proctitis: Secondary | ICD-10-CM | POA: Diagnosis present

## 2018-06-06 DIAGNOSIS — Z888 Allergy status to other drugs, medicaments and biological substances status: Secondary | ICD-10-CM

## 2018-06-06 DIAGNOSIS — D649 Anemia, unspecified: Secondary | ICD-10-CM | POA: Diagnosis not present

## 2018-06-06 DIAGNOSIS — F1020 Alcohol dependence, uncomplicated: Secondary | ICD-10-CM | POA: Diagnosis present

## 2018-06-06 DIAGNOSIS — R531 Weakness: Secondary | ICD-10-CM

## 2018-06-06 DIAGNOSIS — R402360 Coma scale, best motor response, obeys commands, unspecified time: Secondary | ICD-10-CM | POA: Diagnosis present

## 2018-06-06 DIAGNOSIS — I10 Essential (primary) hypertension: Secondary | ICD-10-CM | POA: Diagnosis present

## 2018-06-06 DIAGNOSIS — K626 Ulcer of anus and rectum: Secondary | ICD-10-CM | POA: Diagnosis present

## 2018-06-06 DIAGNOSIS — D62 Acute posthemorrhagic anemia: Secondary | ICD-10-CM | POA: Diagnosis present

## 2018-06-06 DIAGNOSIS — K5730 Diverticulosis of large intestine without perforation or abscess without bleeding: Secondary | ICD-10-CM | POA: Diagnosis present

## 2018-06-06 DIAGNOSIS — K625 Hemorrhage of anus and rectum: Principal | ICD-10-CM | POA: Diagnosis present

## 2018-06-06 DIAGNOSIS — Z923 Personal history of irradiation: Secondary | ICD-10-CM

## 2018-06-06 DIAGNOSIS — Z8719 Personal history of other diseases of the digestive system: Secondary | ICD-10-CM

## 2018-06-06 DIAGNOSIS — Z8546 Personal history of malignant neoplasm of prostate: Secondary | ICD-10-CM

## 2018-06-06 DIAGNOSIS — R402250 Coma scale, best verbal response, oriented, unspecified time: Secondary | ICD-10-CM | POA: Diagnosis present

## 2018-06-06 DIAGNOSIS — Z86010 Personal history of colonic polyps: Secondary | ICD-10-CM

## 2018-06-06 DIAGNOSIS — R402140 Coma scale, eyes open, spontaneous, unspecified time: Secondary | ICD-10-CM | POA: Diagnosis present

## 2018-06-06 DIAGNOSIS — R1110 Vomiting, unspecified: Secondary | ICD-10-CM | POA: Diagnosis not present

## 2018-06-06 DIAGNOSIS — Z79899 Other long term (current) drug therapy: Secondary | ICD-10-CM

## 2018-06-06 DIAGNOSIS — K648 Other hemorrhoids: Secondary | ICD-10-CM | POA: Diagnosis present

## 2018-06-06 DIAGNOSIS — Z515 Encounter for palliative care: Secondary | ICD-10-CM | POA: Diagnosis present

## 2018-06-06 DIAGNOSIS — N170 Acute kidney failure with tubular necrosis: Secondary | ICD-10-CM | POA: Diagnosis present

## 2018-06-06 LAB — BASIC METABOLIC PANEL
Anion gap: 19 — ABNORMAL HIGH (ref 5–15)
BUN: 16 mg/dL (ref 8–23)
CALCIUM: 8.8 mg/dL — AB (ref 8.9–10.3)
CHLORIDE: 99 mmol/L (ref 98–111)
CO2: 19 mmol/L — AB (ref 22–32)
CREATININE: 1.27 mg/dL — AB (ref 0.61–1.24)
GFR calc non Af Amer: 57 mL/min — ABNORMAL LOW (ref >60)
GLUCOSE: 97 mg/dL (ref 70–99)
Potassium: 4.2 mmol/L (ref 3.5–5.1)
Sodium: 137 mmol/L (ref 135–145)

## 2018-06-06 LAB — CBC
HEMATOCRIT: 25.6 % — AB (ref 39.0–52.0)
Hemoglobin: 8 g/dL — ABNORMAL LOW (ref 13.0–17.0)
MCH: 28.3 pg (ref 26.0–34.0)
MCHC: 31.3 g/dL (ref 30.0–36.0)
MCV: 90.5 fL (ref 78.0–100.0)
Platelets: 113 10*3/uL — ABNORMAL LOW (ref 150–400)
RBC: 2.83 MIL/uL — ABNORMAL LOW (ref 4.22–5.81)
RDW: 17.1 % — AB (ref 11.5–15.5)
WBC: 6.7 10*3/uL (ref 4.0–10.5)

## 2018-06-06 LAB — POC OCCULT BLOOD, ED: Fecal Occult Bld: POSITIVE — AB

## 2018-06-06 MED ORDER — SODIUM CHLORIDE 0.9% IV SOLUTION
Freq: Once | INTRAVENOUS | Status: AC
  Administered 2018-06-06: via INTRAVENOUS

## 2018-06-06 MED ORDER — ADULT MULTIVITAMIN W/MINERALS CH
1.0000 | ORAL_TABLET | Freq: Every day | ORAL | Status: DC
  Administered 2018-06-07: 1 via ORAL
  Filled 2018-06-06: qty 1

## 2018-06-06 MED ORDER — FOLIC ACID 1 MG PO TABS
1.0000 mg | ORAL_TABLET | Freq: Every day | ORAL | Status: DC
  Administered 2018-06-07 – 2018-06-09 (×3): 1 mg via ORAL
  Filled 2018-06-06 (×3): qty 1

## 2018-06-06 MED ORDER — PANTOPRAZOLE SODIUM 40 MG PO TBEC
40.0000 mg | DELAYED_RELEASE_TABLET | Freq: Every day | ORAL | Status: DC
  Administered 2018-06-07 – 2018-06-08 (×2): 40 mg via ORAL
  Filled 2018-06-06 (×2): qty 1

## 2018-06-06 MED ORDER — LORAZEPAM 1 MG PO TABS: 1.0000 mg | ORAL_TABLET | Freq: Four times a day (QID) | ORAL | Status: DC | PRN

## 2018-06-06 MED ORDER — SODIUM CHLORIDE 0.9 % IV SOLN: 250.0000 mL | INTRAVENOUS | Status: DC | PRN

## 2018-06-06 MED ORDER — ADULT MULTIVITAMIN W/MINERALS CH
1.0000 | ORAL_TABLET | Freq: Every day | ORAL | Status: DC
  Administered 2018-06-07 – 2018-06-09 (×3): 1 via ORAL
  Filled 2018-06-06 (×3): qty 1

## 2018-06-06 MED ORDER — THIAMINE HCL 100 MG/ML IJ SOLN: 100.0000 mg | Freq: Every day | INTRAMUSCULAR | Status: DC

## 2018-06-06 MED ORDER — ONDANSETRON HCL 4 MG/2ML IJ SOLN: 4.0000 mg | Freq: Four times a day (QID) | INTRAMUSCULAR | Status: DC | PRN

## 2018-06-06 MED ORDER — DOCUSATE SODIUM 100 MG PO CAPS
100.0000 mg | ORAL_CAPSULE | Freq: Two times a day (BID) | ORAL | Status: DC
  Administered 2018-06-07 – 2018-06-08 (×2): 100 mg via ORAL
  Filled 2018-06-06 (×3): qty 1

## 2018-06-06 MED ORDER — LORATADINE 10 MG PO TABS
10.0000 mg | ORAL_TABLET | Freq: Every day | ORAL | Status: DC
  Administered 2018-06-07 – 2018-06-09 (×3): 10 mg via ORAL
  Filled 2018-06-06 (×3): qty 1

## 2018-06-06 MED ORDER — VITAMIN B-1 100 MG PO TABS
100.0000 mg | ORAL_TABLET | Freq: Every day | ORAL | Status: DC
  Administered 2018-06-07 – 2018-06-09 (×3): 100 mg via ORAL
  Filled 2018-06-06 (×3): qty 1

## 2018-06-06 MED ORDER — LORAZEPAM 2 MG/ML IJ SOLN: 1.0000 mg | Freq: Four times a day (QID) | INTRAMUSCULAR | Status: DC | PRN

## 2018-06-06 MED ORDER — AMLODIPINE BESYLATE 5 MG PO TABS
10.0000 mg | ORAL_TABLET | Freq: Every day | ORAL | Status: DC
  Administered 2018-06-07 – 2018-06-09 (×3): 10 mg via ORAL
  Filled 2018-06-06 (×3): qty 2

## 2018-06-06 MED ORDER — ACETAMINOPHEN 500 MG PO TABS: 500.0000 mg | ORAL_TABLET | Freq: Four times a day (QID) | ORAL | Status: DC | PRN

## 2018-06-06 MED ORDER — SODIUM CHLORIDE 0.9 % IV SOLN
INTRAVENOUS | Status: DC
  Administered 2018-06-06: 23:00:00 via INTRAVENOUS

## 2018-06-06 MED ORDER — SODIUM CHLORIDE 0.9% FLUSH: 3.0000 mL | INTRAVENOUS | Status: DC | PRN

## 2018-06-06 MED ORDER — ONDANSETRON HCL 4 MG PO TABS: 4.0000 mg | ORAL_TABLET | Freq: Four times a day (QID) | ORAL | Status: DC | PRN

## 2018-06-06 MED ORDER — SODIUM CHLORIDE 0.9% FLUSH
3.0000 mL | Freq: Two times a day (BID) | INTRAVENOUS | Status: DC
  Administered 2018-06-07: 3 mL via INTRAVENOUS

## 2018-06-06 NOTE — ED Provider Notes (Signed)
[facility] EMERGENCY DEPARTMENT Provider Note   CSN: [id number] Arrival date & time: 06/06/18  1511     History   Chief Complaint Chief Complaint  Patient presents with  . Weakness    HPI Clifford [name] is a 66 y.o. male.  Patient reports increased rectal bleeding and weakness for the past several days.  He is status post colonoscopy on 04/06/2018 by Dr Laural Golden revealing distal rectal telangiectasia with overlying internal hemorrhoids.  On 04/09/2018 he underwent a hemorrhoidectomy by Dr. Aviva Signs.  He required 2 units of blood in that admission.  Bleeding has persisted.  His wife states he has a drinking problem.  Severity of symptoms is moderate.  Nothing makes symptoms better or worse.     Past Medical History:  Diagnosis Date  . Alcoholism ([place])   . Anemia   . High cholesterol   . Hypertension   . Prostate cancer ([place])   . Seizures [facility])     Patient Active Problem List   Diagnosis Date Noted  . Bleeding hemorrhoid   . Thrombocytopenia ([place]) 04/08/2018  . Fever 04/07/2018  . Bleeding internal hemorrhoids   . GI bleed 04/06/2018  . Acute blood loss anemia 04/05/2018  . Acute lower GI bleeding 04/05/2018  . Rectal bleeding 12/06/2017  . Malignant neoplasm of prostate ([place]) 12/04/2016  . Anemia in other chronic diseases classified elsewhere 07/11/2016  . Alcoholism ([place]) 07/04/2016  . Seizures ([place]) 07/04/2016  . Hyperlipidemia 07/04/2016  . Essential hypertension 07/04/2016    Past Surgical History:  Procedure Laterality Date  . CIRCUMCISION    . COLONOSCOPY N/A 09/13/2016   Procedure: COLONOSCOPY;  Surgeon: Rogene Houston, MD;  Location: [facility] ENDO SUITE;  Service: Endoscopy;  Laterality: N/A;  2:15  . COLONOSCOPY N/A 04/06/2018   Procedure: COLONOSCOPY;  Surgeon: Rogene Houston, MD;  Location: [facility] ENDO SUITE;  Service: Endoscopy;  Laterality: N/A;  . FLEXIBLE SIGMOIDOSCOPY N/A 01/10/2018   Procedure: FLEXIBLE SIGMOIDOSCOPY;  Surgeon: Rogene Houston,  MD;  Location: [facility] ENDO SUITE;  Service: Endoscopy;  Laterality: N/A;  1:55  . HEMORRHOID SURGERY N/A 04/09/2018   Procedure: HEMORRHOIDECTOMY;  Surgeon: Aviva Signs, MD;  Location: [facility] ORS;  Service: General;  Laterality: N/A;  . NO PAST SURGERIES    . POLYPECTOMY  04/06/2018   Procedure: POLYPECTOMY;  Surgeon: Rogene Houston, MD;  Location: [facility] ENDO SUITE;  Service: Endoscopy;;  sigmoid  . PROSTATE BIOPSY          Home Medications    Prior to Admission medications   Medication Sig Start Date End Date Taking? Authorizing Provider  amLODipine (NORVASC) 10 MG tablet Take 10 mg by mouth daily.   Yes [provider]  docusate sodium (STOOL SOFTENER) 100 MG capsule Take 100 mg by mouth 2 (two) times daily.   Yes [provider]  HYDROcodone-acetaminophen (NORCO) 5-325 MG tablet Take 1 tablet by mouth every 4 (four) hours as needed for moderate pain. 04/09/18  Yes Aviva Signs, MD  loratadine (CLARITIN) 10 MG tablet Take 10 mg by mouth daily.   Yes [provider]  Multiple Vitamin (MULTIVITAMIN WITH MINERALS) TABS tablet Take 1 tablet by mouth daily.   Yes [provider]  pantoprazole (PROTONIX) 40 MG tablet Take 40 mg by mouth daily.   Yes [provider]  acetaminophen (TYLENOL) 500 MG tablet Take 500 mg by mouth every 6 (six) hours as needed for moderate pain or headache.     [provider]  Family History Family History  Problem Relation Age of Onset  . Alzheimer's disease Mother   . Hypertension Mother   . Hypertension Father   . Stroke Father   . Prostate cancer Father   . Hypertension Brother     Social History Social History   Tobacco Use  . Smoking status: Never Smoker  . Smokeless tobacco: Never Used  Substance Use Topics  . Alcohol use: Yes    Comment: 1-2 bottles of Gin every few days  . Drug use: No     Allergies   Lisinopril   Review of Systems Review of Systems  All other systems reviewed and  are negative.    Physical Exam Updated Vital Signs BP 123/64 (BP Location: Right Arm)   Pulse (!) 101   Temp 99.1 F (37.3 C) (Oral)   Resp 16   Ht 5\' 4"  (1.626 m)   Wt 63.5 kg (140 lb)   SpO2 99%   BMI 24.03 kg/m   Physical Exam  Constitutional: He is oriented to person, place, and time. He appears well-developed and well-nourished.  HENT:  Head: Normocephalic and atraumatic.  Eyes: Conjunctivae are normal.  Neck: Neck supple.  Cardiovascular: Normal rate and regular rhythm.  Pulmonary/Chest: Effort normal and breath sounds normal.  Abdominal: Soft. Bowel sounds are normal.  Nontender  Genitourinary:  Genitourinary Comments: Rectal exam: No masses.  No obvious gross blood.  Heme positive  Musculoskeletal: Normal range of motion.  Neurological: He is alert and oriented to person, place, and time.  Skin: Skin is warm and dry.  Psychiatric: He has a normal mood and affect. His behavior is normal.  Nursing note and vitals reviewed.    ED Treatments / Results  Labs (all labs ordered are listed, but only abnormal results are displayed) Labs Reviewed  CBC - Abnormal; Notable for the following components:      Result Value   RBC 2.83 (*)    Hemoglobin 8.0 (*)    HCT 25.6 (*)    RDW 17.1 (*)    Platelets 113 (*)    All other components within normal limits  BASIC METABOLIC PANEL - Abnormal; Notable for the following components:   CO2 19 (*)    Creatinine, Ser 1.27 (*)    Calcium 8.8 (*)    GFR calc non Af Amer 57 (*)    Anion gap 19 (*)    All other components within normal limits  POC OCCULT BLOOD, ED - Abnormal; Notable for the following components:   Fecal Occult Bld POSITIVE (*)    All other components within normal limits    EKG None  Radiology No results found.  Procedures Procedures (including critical care time)  Medications Ordered in ED Medications - No data to display   Initial Impression / Assessment and Plan / ED Course  I have reviewed  the triage vital signs and the nursing notes.  Pertinent labs & imaging results that were available during my care of the patient were reviewed by me and considered in my medical decision making (see chart for details).     Patient presents with weakness status post internal hemorrhoidectomy in May.  His hemoglobin has dropped from 10.6-8.0.  I attempted to call gastroenterology, but no answer.  I discussed with general surgeon on-call Dr. Constance Haw.  Admit to general medicine.  Discussed with Dr. Chauncy Passy.     CRITICAL CARE Performed by: Nat Christen Total critical care time: 30 minutes Critical care time was exclusive of  separately billable procedures and treating other patients. Critical care was necessary to treat or prevent imminent or life-threatening deterioration. Critical care was time spent personally by me on the following activities: development of treatment plan with patient and/or surrogate as well as nursing, discussions with consultants, evaluation of patient's response to treatment, examination of patient, obtaining history from patient or surrogate, ordering and performing treatments and interventions, ordering and review of laboratory studies, ordering and review of radiographic studies, pulse oximetry and re-evaluation of patient's condition.  Final Clinical Impressions(s) / ED Diagnoses   Final diagnoses:  Rectal bleeding  Anemia, unspecified type  Weakness    ED Discharge Orders    None       Nat Christen, MD 06/06/18 2121

## 2018-06-06 NOTE — ED Triage Notes (Signed)
Pt has been weak and has had bleeding hemorrhoids for the last month. Had hemorrhoidal surgery in May and states it is not working. Bleeding has intensified. Pt alert and oriented. NAD. Vomited yesterday.

## 2018-06-06 NOTE — H&P (Signed)
TR[facility] H&P    Patient Demographics:    Clifford [name], is a 67 y.o. male  MRN: [id number]  DOB - 1951/08/15  Admit Date - 06/06/2018  Referring MD/NP/PA: Dr. Lacinda Axon  Outpatient Primary MD for the patient is [name], FNP  Patient coming from: Home  Chief complaint-rectal bleeding   HPI:    Clifford [name]  is a 67 y.o. 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 Arnoldo Morale came to hospital with lower GI bleed.  Patient says that he has been passing blood with stool for past 2 weeks.  He generally feels weak but denies passing out.  He denies nausea and vomiting.  He had one episode of vomiting yesterday but denies vomiting any blood.  He denies diarrhea.  No abdominal pain.  No fever or chills.  He denies chest pain or shortness of breath. In the ED hemoglobin was found to be 8.0 dropped from 10.6 in May 2019 Also had creatinine of 1.27 last creatinine was 0.81 from 04/08/2018. Denies previous history of stroke or seizures. Denies dysuria urgency or frequency of urination.   Review of systems:      All other systems reviewed and are negative.   With Past History of the following :    Past Medical History:  Diagnosis Date  . Alcoholism ([place])   . Anemia   . High cholesterol   . Hypertension   . Prostate cancer ([place])   . Seizures ([place])       Past Surgical History:  Procedure Laterality Date  . CIRCUMCISION    . COLONOSCOPY N/A 09/13/2016   Procedure: COLONOSCOPY;  Surgeon: Rogene Houston, MD;  Location: [facility] ENDO SUITE;  Service: Endoscopy;  Laterality: N/A;  2:15  . COLONOSCOPY N/A 04/06/2018   Procedure: COLONOSCOPY;  Surgeon:  Rogene Houston, MD;  Location: [facility] ENDO SUITE;  Service: Endoscopy;  Laterality: N/A;  . FLEXIBLE SIGMOIDOSCOPY N/A 01/10/2018   Procedure: FLEXIBLE SIGMOIDOSCOPY;  Surgeon: Rogene Houston, MD;  Location: [facility] ENDO SUITE;  Service: Endoscopy;  Laterality: N/A;  1:55  . HEMORRHOID SURGERY N/A 04/09/2018   Procedure: HEMORRHOIDECTOMY;  Surgeon: Aviva Signs, MD;  Location: [facility] ORS;  Service: General;  Laterality: N/A;  . NO PAST SURGERIES    . POLYPECTOMY  04/06/2018   Procedure: POLYPECTOMY;  Surgeon: Rogene Houston, MD;  Location: [facility] ENDO SUITE;  Service: Endoscopy;;  sigmoid  . PROSTATE BIOPSY        Social History:      Social History   Tobacco Use  . Smoking status: Never Smoker  . Smokeless tobacco: Never Used  Substance Use Topics  . Alcohol use: Yes    Comment: 1-2 bottles of Gin every few days       Family History :     Family History  Problem Relation Age of Onset  .  Alzheimer's disease Mother   . Hypertension Mother   . Hypertension Father   . Stroke Father   . Prostate cancer Father   . Hypertension Brother       Home Medications:   Prior to Admission medications   Medication Sig Start Date End Date Taking? Authorizing Provider  amLODipine (NORVASC) 10 MG tablet Take 10 mg by mouth daily.   Yes [provider]  docusate sodium (STOOL SOFTENER) 100 MG capsule Take 100 mg by mouth 2 (two) times daily.   Yes [provider]  HYDROcodone-acetaminophen (NORCO) 5-325 MG tablet Take 1 tablet by mouth every 4 (four) hours as needed for moderate pain. 04/09/18  Yes Aviva Signs, MD  loratadine (CLARITIN) 10 MG tablet Take 10 mg by mouth daily.   Yes [provider]  Multiple Vitamin (MULTIVITAMIN WITH MINERALS) TABS tablet Take 1 tablet by mouth daily.   Yes [provider]  pantoprazole (PROTONIX) 40 MG tablet Take 40 mg by mouth daily.   Yes [provider]  acetaminophen (TYLENOL) 500 MG tablet Take 500 mg by mouth  every 6 (six) hours as needed for moderate pain or headache.     [provider]     Allergies:     Allergies  Allergen Reactions  . Lisinopril Swelling     Physical Exam:   Vitals  Blood pressure 123/64, pulse (!) 101, temperature 99.1 F (37.3 C), temperature source Oral, resp. rate 16, height 5\' 4"  (1.626 m), weight 63.5 kg (140 lb), SpO2 99 %.  1.  General: Appears in no acute distress  2. Psychiatric:  Intact judgement and  insight, awake alert, oriented x 3.  3. Neurologic: No focal neurological deficits, all cranial nerves intact.Strength 5/5 all 4 extremities, sensation intact all 4 extremities, plantars down going.  4. Eyes :  anicteric sclerae, moist conjunctivae with no lid lag. PERRLA.  5. ENMT:  Oropharynx clear with moist mucous membranes and good dentition  6. Neck:  supple, no cervical lymphadenopathy appriciated, No thyromegaly  7. Respiratory : Normal respiratory effort, good air movement bilaterally,clear to  auscultation bilaterally  8. Cardiovascular : RRR, no gallops, rubs or murmurs, no leg edema  9. Gastrointestinal:  Positive bowel sounds, abdomen soft, non-tender to palpation,no hepatosplenomegaly, no rigidity or guarding       10. Skin:  No cyanosis, normal texture and turgor, no rash, lesions or ulcers  11.Musculoskeletal:  Good muscle tone,  joints appear normal , no effusions,  normal range of motion    Data Review:    CBC Recent Labs  Lab 06/06/18 1615  WBC 6.7  HGB 8.0*  HCT 25.6*  PLT 113*  MCV 90.5  MCH 28.3  MCHC 31.3  RDW 17.1*   ------------------------------------------------------------------------------------------------------------------  Chemistries  Recent Labs  Lab 06/06/18 1615  NA 137  K 4.2  CL 99  CO2 19*  GLUCOSE 97  BUN 16  CREATININE 1.27*  CALCIUM 8.8*    ------------------------------------------------------------------------------------------------------------------  ------------------------------------------------------------------------------------------------------------------ GFR: Estimated Creatinine Clearance: 47.3 mL/min (A) (by C-G formula based on SCr of 1.27 mg/dL (H)). Liver Function Tests: No results for input(s): AST, ALT, ALKPHOS, BILITOT, PROT, ALBUMIN in the last 168 hours. No results for input(s): LIPASE, AMYLASE in the last 168 hours. No results for input(s): AMMONIA in the last 168 hours. Coagulation Profile: No results for input(s): INR, PROTIME in the last 168 hours. Cardiac Enzymes: No results for input(s): CKTOTAL, CKMB, CKMBINDEX, TROPONINI in the last 168 hours. BNP (last 3 results)  No results for input(s): PROBNP in the last 8760 hours. HbA1C: No results for input(s): HGBA1C in the last 72 hours. CBG: No results for input(s): GLUCAP in the last 168 hours. Lipid Profile: No results for input(s): CHOL, HDL, LDLCALC, TRIG, CHOLHDL, LDLDIRECT in the last 72 hours. Thyroid Function Tests: No results for input(s): TSH, T4TOTAL, FREET4, T3FREE, THYROIDAB in the last 72 hours. Anemia Panel: No results for input(s): VITAMINB12, FOLATE, FERRITIN, TIBC, IRON, RETICCTPCT in the last 72 hours.  ---------------------------------------------------------------------------------------------------------------    Imaging Results:      Assessment & Plan:    Active Problems:   Rectal bleed   1. Rectal bleeding-unclear etiology, patient's colonoscopy in May showed sigmoid diverticulosis also had moderate sized internal hemorrhoids status post hemorrhoidectomy, rectal angiectasia.  We will keep patient n.p.o. and consult GI in a.m. 2. Acute blood loss anemia-patient hemoglobin is 8.0, patient has symptomatic anemia with weakness.  Will give 1 unit PRBC.  Check CBC in a.m. 3. Acute kidney injury-mild creatinine is  1.27.  Baseline creatinine 0.8 from as of Apr 08 2018.  Start gentle IV hydration after blood transfusion.  Follow BMP in am. 4. Hypertension-blood pressure stable, continue amlodipine. 5. Prostate cancer-followed by urology as outpatient.  He underwent 22 rounds of radiation and completed treatment in August last year  6. Alcohol dependence-we will start CIWA protocol.   DVT Prophylaxis-   SCDs   AM Labs Ordered, also please review Full Orders  Family Communication: Admission, patients condition and plan of care including tests being ordered have been discussed with the patient and his wife at bedside* who indicate understanding and agree with the plan and Code Status.  Code Status: Full code  Admission status: Observation  Time spent in minutes : 60 minutes   Oswald Hillock M.D on 06/06/2018 at 9:25 PM  Between 7am to 7pm - Pager - [phone]. After 7pm go to [email or web address] - password [facility]  [facility] - Office  [phone]

## 2018-06-07 DIAGNOSIS — K625 Hemorrhage of anus and rectum: Secondary | ICD-10-CM | POA: Diagnosis present

## 2018-06-07 DIAGNOSIS — K921 Melena: Secondary | ICD-10-CM | POA: Diagnosis not present

## 2018-06-07 DIAGNOSIS — Z79899 Other long term (current) drug therapy: Secondary | ICD-10-CM | POA: Diagnosis not present

## 2018-06-07 DIAGNOSIS — N170 Acute kidney failure with tubular necrosis: Secondary | ICD-10-CM | POA: Diagnosis present

## 2018-06-07 DIAGNOSIS — Z515 Encounter for palliative care: Secondary | ICD-10-CM | POA: Diagnosis present

## 2018-06-07 DIAGNOSIS — I10 Essential (primary) hypertension: Secondary | ICD-10-CM | POA: Diagnosis present

## 2018-06-07 DIAGNOSIS — Z86010 Personal history of colonic polyps: Secondary | ICD-10-CM | POA: Diagnosis not present

## 2018-06-07 DIAGNOSIS — K626 Ulcer of anus and rectum: Secondary | ICD-10-CM | POA: Diagnosis present

## 2018-06-07 DIAGNOSIS — Z888 Allergy status to other drugs, medicaments and biological substances status: Secondary | ICD-10-CM | POA: Diagnosis not present

## 2018-06-07 DIAGNOSIS — Z923 Personal history of irradiation: Secondary | ICD-10-CM | POA: Diagnosis not present

## 2018-06-07 DIAGNOSIS — R1110 Vomiting, unspecified: Secondary | ICD-10-CM | POA: Diagnosis not present

## 2018-06-07 DIAGNOSIS — R402360 Coma scale, best motor response, obeys commands, unspecified time: Secondary | ICD-10-CM | POA: Diagnosis present

## 2018-06-07 DIAGNOSIS — F1020 Alcohol dependence, uncomplicated: Secondary | ICD-10-CM | POA: Diagnosis present

## 2018-06-07 DIAGNOSIS — Z8719 Personal history of other diseases of the digestive system: Secondary | ICD-10-CM | POA: Diagnosis not present

## 2018-06-07 DIAGNOSIS — R402140 Coma scale, eyes open, spontaneous, unspecified time: Secondary | ICD-10-CM | POA: Diagnosis present

## 2018-06-07 DIAGNOSIS — R402250 Coma scale, best verbal response, oriented, unspecified time: Secondary | ICD-10-CM | POA: Diagnosis present

## 2018-06-07 DIAGNOSIS — K648 Other hemorrhoids: Secondary | ICD-10-CM | POA: Diagnosis present

## 2018-06-07 DIAGNOSIS — K627 Radiation proctitis: Secondary | ICD-10-CM | POA: Diagnosis present

## 2018-06-07 DIAGNOSIS — K5730 Diverticulosis of large intestine without perforation or abscess without bleeding: Secondary | ICD-10-CM | POA: Diagnosis present

## 2018-06-07 DIAGNOSIS — D62 Acute posthemorrhagic anemia: Secondary | ICD-10-CM | POA: Diagnosis present

## 2018-06-07 DIAGNOSIS — Z8546 Personal history of malignant neoplasm of prostate: Secondary | ICD-10-CM | POA: Diagnosis not present

## 2018-06-07 LAB — CBC
HEMATOCRIT: 29.2 % — AB (ref 39.0–52.0)
Hemoglobin: 9.3 g/dL — ABNORMAL LOW (ref 13.0–17.0)
MCH: 28.4 pg (ref 26.0–34.0)
MCHC: 31.8 g/dL (ref 30.0–36.0)
MCV: 89.3 fL (ref 78.0–100.0)
PLATELETS: 85 10*3/uL — AB (ref 150–400)
RBC: 3.27 MIL/uL — AB (ref 4.22–5.81)
RDW: 17 % — AB (ref 11.5–15.5)
WBC: 7.3 10*3/uL (ref 4.0–10.5)

## 2018-06-07 LAB — COMPREHENSIVE METABOLIC PANEL
ALBUMIN: 3.9 g/dL (ref 3.5–5.0)
ALT: 41 U/L (ref 0–44)
ANION GAP: 20 — AB (ref 5–15)
AST: 100 U/L — AB (ref 15–41)
Alkaline Phosphatase: 72 U/L (ref 38–126)
BILIRUBIN TOTAL: 2 mg/dL — AB (ref 0.3–1.2)
BUN: 16 mg/dL (ref 8–23)
CALCIUM: 9.1 mg/dL (ref 8.9–10.3)
CO2: 18 mmol/L — AB (ref 22–32)
CREATININE: 1.21 mg/dL (ref 0.61–1.24)
Chloride: 99 mmol/L (ref 98–111)
GFR calc Af Amer: >60 mL/min (ref >60)
GFR calc non Af Amer: >60 mL/min (ref >60)
GLUCOSE: 93 mg/dL (ref 70–99)
Potassium: 4.5 mmol/L (ref 3.5–5.1)
Sodium: 137 mmol/L (ref 135–145)
TOTAL PROTEIN: 7.6 g/dL (ref 6.5–8.1)

## 2018-06-07 LAB — PREPARE RBC (CROSSMATCH)

## 2018-06-07 LAB — MRSA PCR SCREENING: MRSA by PCR: NEGATIVE

## 2018-06-07 MED ORDER — PEG 3350-KCL-NA BICARB-NACL 420 G PO SOLR
4000.0000 mL | Freq: Once | ORAL | Status: DC
  Filled 2018-06-07: qty 4000

## 2018-06-07 MED ORDER — BISACODYL 5 MG PO TBEC
10.0000 mg | DELAYED_RELEASE_TABLET | Freq: Once | ORAL | Status: AC
  Administered 2018-06-07: 10 mg via ORAL
  Filled 2018-06-07: qty 2

## 2018-06-07 MED ORDER — PEG 3350-KCL-NA BICARB-NACL 420 G PO SOLR
4000.0000 mL | Freq: Once | ORAL | Status: AC
  Administered 2018-06-08: 4000 mL via ORAL
  Filled 2018-06-07: qty 4000

## 2018-06-07 MED ORDER — SODIUM CHLORIDE 0.45 % IV SOLN
INTRAVENOUS | Status: DC
  Administered 2018-06-07: 17:00:00 via INTRAVENOUS

## 2018-06-07 NOTE — Care Management Obs Status (Signed)
[facility] NOTIFICATION   Patient Details  Name: CHANAN DETWILER MRN: [id number] Date of Birth: 02-01-1951   Medicare Observation Status Notification Given:  Yes    Jakaiya Netherland, Chauncey Reading, RN 06/07/2018, 11:27 AM

## 2018-06-07 NOTE — Progress Notes (Signed)
PROGRESS NOTE    Clifford [name]  HUD:[id number] DOB: 18-Feb-1951 DOA: 06/06/2018 PCP: [name], FNP     Brief Narrative:  67 year old man admitted from home on 7/25 after an episode of painless rectal bleeding.  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  He was actively bleeding during a sigmoidoscopy in February of this year.  But on admission was 8 and he did receive 1 unit of PRBCs.   Assessment & Plan:   Active Problems:   Rectal bleed   Rectal bleeding -Likely due to either internal hemorrhoids or his rectal telangiectasias or combination of both. -Seen in consultation by GI, they are planning on flexible sigmoidoscopy to be performed in a.m. -He did have another episode this morning.  Acute blood loss anemia -Hemoglobin on admission 8.0, received 1 unit of PRBCs repeat hemoglobin with appropriate response at 9.3.  Alcohol abuse -Thiamine/folate/C I WA protocol.  Acute renal failure -Mild, presenting creatinine is 1.2, baseline creatinine is 0.8. -Suspect due to prerenal azotemia/ATN. -Start gentle IV hydration, recheck renal function in a.m.  Prostate cancer -Followed by urology as an outpatient, he has received radiation and completed treatment in August 2018.   DVT prophylaxis: SCDs Code Status: Full code Family Communication: Wife at bedside updated on plan of care and all questions answered Disposition Plan: Discharge home pending results of flex sig and GI recommendations.  Transfer to floor.     Consultants:   GI  Procedures:   Flexible sigmoidoscopy planned for 7/27.  Antimicrobials:  Anti-infectives (From admission, onward)   None       Subjective: In bed, no complaints, had a large volume painless rectal bleeding earlier this morning.  Objective: Vitals:   06/07/18 0447 06/07/18 0615 06/07/18 0811 06/07/18 1750  BP:  (!) 144/82 138/75 (!) 148/72   Pulse: 93 82 99   Resp: 16 16 18    Temp: 100.1 F (37.8 C) 99.2 F (37.3 C) 100.3 F (37.9 C) 97.6 F (36.4 C)  TempSrc: Oral Oral Oral Oral  SpO2: 100% 100% 100%   Weight: 62.9 kg (138 lb 10.7 oz)     Height:        Intake/Output Summary (Last 24 hours) at 06/07/2018 1802 Last data filed at 06/07/2018 1600 Gross per 24 hour  Intake 660.34 ml  Output 275 ml  Net 385.34 ml   Filed Weights   06/06/18 1523 06/06/18 2300 06/07/18 0447  Weight: 63.5 kg (140 lb) 62.9 kg (138 lb 10.7 oz) 62.9 kg (138 lb 10.7 oz)    Examination:  General exam: Alert, awake, oriented x 3 Respiratory system: Clear to auscultation. Respiratory effort normal. Cardiovascular system:RRR. No murmurs, rubs, gallops. Gastrointestinal system: Abdomen is nondistended, soft and nontender. No organomegaly or masses felt. Normal bowel sounds heard. Central nervous system: Alert and oriented. No focal neurological deficits. Extremities: No C/C/E, +pedal pulses Skin: No rashes, lesions or ulcers Psychiatry: Judgement and insight appear normal. Mood & affect appropriate.     Data Reviewed: I have personally reviewed following labs and imaging studies  CBC: Recent Labs  Lab 06/06/18 1615 06/07/18 0934  WBC 6.7 7.3  HGB 8.0* 9.3*  HCT 25.6* 29.2*  MCV 90.5 89.3  PLT 113* 85*   Basic Metabolic Panel: Recent Labs  Lab 06/06/18 1615 06/07/18 0934  NA 137 137  K 4.2 4.5  CL 99 99  CO2 19* 18*  GLUCOSE 97  93  BUN 16 16  CREATININE 1.27* 1.21  CALCIUM 8.8* 9.1   GFR: Estimated Creatinine Clearance: 49.6 mL/min (by C-G formula based on SCr of 1.21 mg/dL). Liver Function Tests: Recent Labs  Lab 06/07/18 0934  AST 100*  ALT 41  ALKPHOS 72  BILITOT 2.0*  PROT 7.6  ALBUMIN 3.9   No results for input(s): LIPASE, AMYLASE in the last 168 hours. No results for input(s): AMMONIA in the last 168 hours. Coagulation Profile: No results for input(s): INR, PROTIME in the  last 168 hours. Cardiac Enzymes: No results for input(s): CKTOTAL, CKMB, CKMBINDEX, TROPONINI in the last 168 hours. BNP (last 3 results) No results for input(s): PROBNP in the last 8760 hours. HbA1C: No results for input(s): HGBA1C in the last 72 hours. CBG: No results for input(s): GLUCAP in the last 168 hours. Lipid Profile: No results for input(s): CHOL, HDL, LDLCALC, TRIG, CHOLHDL, LDLDIRECT in the last 72 hours. Thyroid Function Tests: No results for input(s): TSH, T4TOTAL, FREET4, T3FREE, THYROIDAB in the last 72 hours. Anemia Panel: No results for input(s): VITAMINB12, FOLATE, FERRITIN, TIBC, IRON, RETICCTPCT in the last 72 hours. Urine analysis:    Component Value Date/Time   COLORURINE YELLOW 04/07/2018 [place] 04/07/2018 1015   LABSPEC 1.008 04/07/2018 1015   PHURINE 7.0 04/07/2018 1015   GLUCOSEU NEGATIVE 04/07/2018 1015   HGBUR SMALL (A) 04/07/2018 1015   BILIRUBINUR NEGATIVE 04/07/2018 1015   KETONESUR 5 (A) 04/07/2018 1015   PROTEINUR 30 (A) 04/07/2018 1015   NITRITE NEGATIVE 04/07/2018 1015   LEUKOCYTESUR NEGATIVE 04/07/2018 1015   Sepsis Labs: @LABRCNTIP (procalcitonin:4,lacticidven:4)  ) Recent Results (from the past 240 hour(s))  MRSA PCR Screening     Status: None   Collection Time: 06/06/18 10:55 PM  Result Value Ref Range Status   MRSA by PCR NEGATIVE NEGATIVE Final    Comment:        The GeneXpert MRSA Assay (FDA approved for NASAL specimens only), is one component of a comprehensive MRSA colonization surveillance program. It is not intended to diagnose MRSA infection nor to guide or monitor treatment for MRSA infections. Performed at [facility], [address] [place], [place] [address]          Radiology Studies: No results found.      Scheduled Meds: . amLODipine  10 mg Oral Daily  . bisacodyl  10 mg Oral Once  . docusate sodium  100 mg Oral BID  . folic acid  1 mg Oral Daily  . loratadine  10 mg Oral  Daily  . multivitamin with minerals  1 tablet Oral Daily  . multivitamin with minerals  1 tablet Oral Daily  . pantoprazole  40 mg Oral Daily  . [START ON 06/08/2018] polyethylene glycol-electrolytes  4,000 mL Oral Once  . sodium chloride flush  3 mL Intravenous Q12H  . thiamine  100 mg Oral Daily   Or  . thiamine  100 mg Intravenous Daily   Continuous Infusions: . sodium chloride 50 mL/hr at 06/07/18 1643  . sodium chloride    . sodium chloride 50 mL/hr at 06/07/18 0500     LOS: 0 days    Time spent: 35 minutes. Greater than 50% of this time was spent in direct contact with the patient and with patient's wife, coordinating care and discussing relevant ongoing clinical issues, including potential etiologies for his rectal bleeding and plan for GI consultation to further determine need of colonoscopy this admission.  Lelon Frohlich, MD [facility] Pager [phone]  If 7PM-7AM, please contact night-coverage [email or web address] Password [facility] 06/07/2018, 6:02 PM

## 2018-06-07 NOTE — Consult Note (Signed)
Referring Provider: No ref. provider found Primary Care Physician:  [name], FNP Primary Gastroenterologist:  Dr. Laural Golden  Reason for Consultation:    Rectal bleeding and anemia.  HPI:   Patient is 67 year old Afro-American male who was admitted to hospice service last evening by emergency room where he presented with rectal bleeding and postural symptoms.  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  He was noted to be actively bleeding on flexible sigmoidoscopy of February 2019 and bleeding was controlled with hemospray.  He returned to 3 months later with rectal bleeding and received 2 units of PRBCs.  Colonoscopy revealed large internal hemorrhoids and multiple telangiectasia overlying hemorrhoids.  He underwent hemorrhoidectomy by Dr. Aviva Signs on 04/09/2018. He states he noted blood with his bowel movements on few occasions but it was small.  Over the last few days he has been passing large amount of blood with his bowel movements.  He has not been constipated or having diarrhea.  He denies abdominal pain nausea or vomiting.  Over the last few days he has noted weakness and postural lightheadedness.  His H&H on presentation was 8.0 and 25.6.  He received 1 unit of PRBCs and hemoglobin from this morning was 9.3 g.  He did notice some blood with his bowel movement earlier today. He does not take aspirin or other OTC NSAIDs. He continues to drink alcohol.  He tells me he drinks a half of 1/5 of gin every day. He is married.  He is retired.  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  He has 3 grown up sons. His mother lived to be 25 and father died at 1 of CVA. He has a brother and sister in good health.    Past Medical History:  Diagnosis Date  . Alcoholism ([place])   . Anemia   . High cholesterol   . Hypertension   . Prostate cancer ([place])   . Seizures ([place])     Past Surgical History:  Procedure  Laterality Date  . CIRCUMCISION    . COLONOSCOPY N/A 09/13/2016   Procedure: COLONOSCOPY;  Surgeon: Rogene Houston, MD;  [facility];  Service: Endoscopy;  Laterality: N/A;  2:15  . COLONOSCOPY N/A 04/06/2018   Procedure: COLONOSCOPY;  Surgeon: Rogene Houston, MD;  [facility];  Service: Endoscopy;  Laterality: N/A;  . FLEXIBLE SIGMOIDOSCOPY N/A 01/10/2018   Procedure: FLEXIBLE SIGMOIDOSCOPY;  Surgeon: Rogene Houston, MD;  [facility];  Service: Endoscopy;  Laterality: N/A;  1:55  . HEMORRHOID SURGERY N/A 04/09/2018   Procedure: HEMORRHOIDECTOMY;  Surgeon: Aviva Signs, MD;  Location: [facility];  Service: General;  Laterality: N/A;  . NO PAST SURGERIES    . POLYPECTOMY  04/06/2018   Procedure: POLYPECTOMY;  Surgeon: Rogene Houston, MD;  [facility];  Service: Endoscopy;;  sigmoid  . PROSTATE BIOPSY      Prior to Admission medications   Medication Sig Start Date End Date Taking? Authorizing Provider  amLODipine (NORVASC) 10 MG tablet Take 10 mg by mouth daily.   Yes [provider]  docusate sodium (STOOL SOFTENER) 100 MG capsule Take 100 mg by mouth 2 (two) times daily.   Yes [provider]  HYDROcodone-acetaminophen (NORCO) 5-325 MG tablet Take 1 tablet by mouth every 4 (four) hours as needed for moderate pain. 04/09/18  Yes Aviva Signs, MD  loratadine (CLARITIN) 10 MG  tablet Take 10 mg by mouth daily.   Yes [provider]  Multiple Vitamin (MULTIVITAMIN WITH MINERALS) TABS tablet Take 1 tablet by mouth daily.   Yes [provider]  pantoprazole (PROTONIX) 40 MG tablet Take 40 mg by mouth daily.   Yes [provider]  acetaminophen (TYLENOL) 500 MG tablet Take 500 mg by mouth every 6 (six) hours as needed for moderate pain or headache.     [provider]    Current Facility-Administered Medications  Medication Dose Route Frequency Provider Last Rate Last Dose  . 0.45 % sodium  chloride infusion   Intravenous Continuous Trevon Strothers U, MD      . 0.9 %  sodium chloride infusion  250 mL Intravenous PRN Darrick Meigs, Gagan S, MD      . 0.9 %  sodium chloride infusion   Intravenous Continuous Oswald Hillock, MD 50 mL/hr at 06/07/18 0500    . acetaminophen (TYLENOL) tablet 500 mg  500 mg Oral Q6H PRN Oswald Hillock, MD      . amLODipine (NORVASC) tablet 10 mg  10 mg Oral Daily Oswald Hillock, MD   10 mg at 06/07/18 0958  . bisacodyl (DULCOLAX) EC tablet 10 mg  10 mg Oral Once Genella Bas U, MD      . docusate sodium (COLACE) capsule 100 mg  100 mg Oral BID Darrick Meigs, Gagan S, MD      . folic acid (FOLVITE) tablet 1 mg  1 mg Oral Daily Oswald Hillock, MD   1 mg at 06/07/18 0959  . loratadine (CLARITIN) tablet 10 mg  10 mg Oral Daily Oswald Hillock, MD   10 mg at 06/07/18 0959  . LORazepam (ATIVAN) tablet 1 mg  1 mg Oral Q6H PRN Oswald Hillock, MD       Or  . LORazepam (ATIVAN) injection 1 mg  1 mg Intravenous Q6H PRN Oswald Hillock, MD      . multivitamin with minerals tablet 1 tablet  1 tablet Oral Daily Oswald Hillock, MD   1 tablet at 06/07/18 0959  . multivitamin with minerals tablet 1 tablet  1 tablet Oral Daily Oswald Hillock, MD   1 tablet at 06/07/18 0959  . ondansetron (ZOFRAN) tablet 4 mg  4 mg Oral Q6H PRN Oswald Hillock, MD       Or  . ondansetron (ZOFRAN) injection 4 mg  4 mg Intravenous Q6H PRN Oswald Hillock, MD      . pantoprazole (PROTONIX) EC tablet 40 mg  40 mg Oral Daily Oswald Hillock, MD   40 mg at 06/07/18 0959  . polyethylene glycol-electrolytes (NuLYTELY/GoLYTELY) solution 4,000 mL  4,000 mL Oral Once Othell Jaime U, MD      . sodium chloride flush (NS) 0.9 % injection 3 mL  3 mL Intravenous Q12H Darrick Meigs, Marge Duncans, MD   3 mL at 06/07/18 1000  . sodium chloride flush (NS) 0.9 % injection 3 mL  3 mL Intravenous PRN [place], Marge Duncans, MD      . thiamine (VITAMIN B-1) tablet 100 mg  100 mg Oral Daily Oswald Hillock, MD   100 mg at 06/07/18 4098   Or  . thiamine (B-1) injection  100 mg  100 mg Intravenous Daily Oswald Hillock, MD        Allergies as of 06/06/2018 - Review Complete 06/06/2018  Allergen Reaction Noted  . Lisinopril Swelling 09/11/2016    Family History  Problem Relation Age of Onset  . Alzheimer's disease Mother   . Hypertension Mother   . Hypertension Father   . Stroke Father   . Prostate cancer Father   . Hypertension Brother     Social History   Socioeconomic History  . Marital status: Married    Spouse name: Not on file  . Number of children: Not on file  . Years of education: Not on file  . Highest education level: Not on file  Occupational History  . Not on file  Social Needs  . Financial resource strain: Not on file  . Food insecurity:    Worry: Not on file    Inability: Not on file  . Transportation needs:    Medical: Not on file    Non-medical: Not on file  Tobacco Use  . Smoking status: Never Smoker  . Smokeless tobacco: Never Used  Substance and Sexual Activity  . Alcohol use: Yes    Comment: 1-2 bottles of Gin every few days  . Drug use: No  . Sexual activity: Not on file  Lifestyle  . Physical activity:    Days per week: Not on file    Minutes per session: Not on file  . Stress: Not on file  Relationships  . Social connections:    Talks on phone: Not on file    Gets together: Not on file    Attends religious service: Not on file    Active member of club or organization: Not on file    Attends meetings of clubs or organizations: Not on file    Relationship status: Not on file  . Intimate partner violence:    Fear of current or ex partner: Not on file    Emotionally abused: Not on file    Physically abused: Not on file    Forced sexual activity: Not on file  Other Topics Concern  . Not on file  Social History Narrative  . Not on file    Review of Systems: See HPI, otherwise normal ROS  Physical Exam: Temp:  [99 F (37.2 C)-100.3 F (37.9 C)] 100.3 F (37.9 C) (07/26 0811) Pulse Rate:   [82-101] 99 (07/26 0811) Resp:  [16-24] 18 (07/26 0811) BP: (123-148)/(61-82) 148/72 (07/26 0811) SpO2:  [99 %-100 %] 100 % (07/26 0811) Weight:  [138 lb 10.7 oz (62.9 kg)] 138 lb 10.7 oz (62.9 kg) (07/26 0447) Last BM Date: 06/07/18  Well-developed well-nourished Afro-American male in NAD. Patient is not tremulous. Conjunctiva is pale.  Sclera nonicteric. Oropharyngeal mucosa is normal.  He has few of his own teeth in upper and lower jaw.  He has partial dentures but they are at home. No neck masses or thyromegaly noted. Cardiac exam with regular rhythm normal S1-S2.  No murmur gallop noted. Abdomen is full.  Bowel sounds normal.  On palpation abdomen is soft and nontender with organomegaly or masses. Extremities are thin but no clubbing or edema noted.   Lab Results: Recent Labs    06/06/18 1615 06/07/18 0934  WBC 6.7 7.3  HGB 8.0* 9.3*  HCT 25.6* 29.2*  PLT 113* 85*   BMET Recent Labs    06/06/18 1615 06/07/18 0934  NA 137 137  K 4.2 4.5  CL 99 99  CO2 19* 18*  GLUCOSE 97 93  BUN 16 16  CREATININE 1.27* 1.21  CALCIUM 8.8* 9.1   LFT Recent Labs    06/07/18 0934  PROT 7.6  ALBUMIN 3.9  AST 100*  ALT 41  ALKPHOS 72  BILITOT 2.0*    Assessment;  Large volume painless hematochezia.  Patient has history of bleeding from rectal telangiectasia that he developed following radiation therapy for prostate carcinoma as well as internal hemorrhoids..  He was actively bleeding on a sigmoidoscopy 01/10/2018 and treated with hemospray. He experienced another bout of hematochezia with drop in H&H and required 2 units of PRBCs.  Examination revealed large internal hemorrhoids and telangiectasia overlying these hemorrhoids.  He underwent hemorrhoidectomy. He remains to be seen if he is bleeding from recurrent hemorrhoids or telangiectasia.  It is possible that both of these conditions may be amenable to endoscopic therapy unless hemorrhoids are very large. He has received 1  unit of PRBCs.  Hemoglobin is low but has come up.  Elevated AST with normal ALT felt to be secondary to alcoholic hepatitis which she has history of.  He continues to drink half of 1/5 of gin every day.  Clinically he does not show any signs and symptoms of alcohol withdrawal.  Dr. Jerilee Hoh has initiated CIWA protocol.  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  He does not have splenomegaly or other stigmata of portal hypertension.  He may benefit from abdominal ultrasound at some point.  Recommendations;  Begin half-normal saline at 50 mL/h. Clear liquids today. Dulcolax 10 mg p.o. Tonight. GoLYTELY 4 L starting tomorrow morning  Flexible sigmoidoscopy with APC of distal rectal telangiectasia if these lesions are felt to be source of bleeding. If rectal bleeding is secondary to hemorrhoids these may be banded or treated at endoscopy. INR with am lab. Dr. Oneida Alar will be performing the examination tomorrow.   LOS: 0 days   Kylynn Street  06/07/2018, 4:00 PM

## 2018-06-08 ENCOUNTER — Encounter (HOSPITAL_COMMUNITY): Payer: Self-pay | Admitting: *Deleted

## 2018-06-08 ENCOUNTER — Encounter (HOSPITAL_COMMUNITY)
Admission: EM | Disposition: A | Discharge status: Home / Self Care | Payer: Self-pay | Source: Home / Self Care | Attending: Internal Medicine

## 2018-06-08 DIAGNOSIS — K626 Ulcer of anus and rectum: Secondary | ICD-10-CM

## 2018-06-08 DIAGNOSIS — K921 Melena: Secondary | ICD-10-CM

## 2018-06-08 DIAGNOSIS — K5730 Diverticulosis of large intestine without perforation or abscess without bleeding: Secondary | ICD-10-CM

## 2018-06-08 HISTORY — PX: FLEXIBLE SIGMOIDOSCOPY: SHX5431

## 2018-06-08 LAB — BASIC METABOLIC PANEL
Anion gap: 16 — ABNORMAL HIGH (ref 5–15)
BUN: 12 mg/dL (ref 8–23)
CHLORIDE: 99 mmol/L (ref 98–111)
CO2: 20 mmol/L — ABNORMAL LOW (ref 22–32)
Calcium: 9.2 mg/dL (ref 8.9–10.3)
Creatinine, Ser: 1 mg/dL (ref 0.61–1.24)
GFR calc Af Amer: >60 mL/min (ref >60)
GFR calc non Af Amer: >60 mL/min (ref >60)
Glucose, Bld: 106 mg/dL — ABNORMAL HIGH (ref 70–99)
Potassium: 3.8 mmol/L (ref 3.5–5.1)
SODIUM: 135 mmol/L (ref 135–145)

## 2018-06-08 LAB — CBC
HCT: 31 % — ABNORMAL LOW (ref 39.0–52.0)
HEMOGLOBIN: 9.7 g/dL — AB (ref 13.0–17.0)
MCH: 28.2 pg (ref 26.0–34.0)
MCHC: 31.3 g/dL (ref 30.0–36.0)
MCV: 90.1 fL (ref 78.0–100.0)
Platelets: 83 10*3/uL — ABNORMAL LOW (ref 150–400)
RBC: 3.44 MIL/uL — AB (ref 4.22–5.81)
RDW: 17.2 % — ABNORMAL HIGH (ref 11.5–15.5)
WBC: 7.8 10*3/uL (ref 4.0–10.5)

## 2018-06-08 LAB — TYPE AND SCREEN
ABO/RH(D): A POS
Antibody Screen: NEGATIVE
Unit division: 0

## 2018-06-08 LAB — BPAM RBC
BLOOD PRODUCT EXPIRATION DATE: 201908112359
ISSUE DATE / TIME: 201907260319
Unit Type and Rh: 6200

## 2018-06-08 LAB — PROTIME-INR
INR: 1.01
Prothrombin Time: 13.2 seconds (ref 11.4–15.2)

## 2018-06-08 SURGERY — SIGMOIDOSCOPY, FLEXIBLE
Anesthesia: Moderate Sedation

## 2018-06-08 MED ORDER — PANTOPRAZOLE SODIUM 40 MG PO TBEC
40.0000 mg | DELAYED_RELEASE_TABLET | Freq: Every day | ORAL | Status: DC
  Administered 2018-06-09: 40 mg via ORAL
  Filled 2018-06-08: qty 1

## 2018-06-08 MED ORDER — MIDAZOLAM HCL 5 MG/5ML IJ SOLN
INTRAMUSCULAR | Status: DC | PRN
  Administered 2018-06-08: 2 mg via INTRAVENOUS
  Administered 2018-06-08 (×2): 1 mg via INTRAVENOUS

## 2018-06-08 MED ORDER — MEPERIDINE HCL 100 MG/ML IJ SOLN
INTRAMUSCULAR | Status: AC
  Filled 2018-06-08: qty 2

## 2018-06-08 MED ORDER — MEPERIDINE HCL 100 MG/ML IJ SOLN
INTRAMUSCULAR | Status: DC | PRN
  Administered 2018-06-08: 25 mg via INTRAVENOUS

## 2018-06-08 MED ORDER — MIDAZOLAM HCL 5 MG/5ML IJ SOLN
INTRAMUSCULAR | Status: AC
  Filled 2018-06-08: qty 10

## 2018-06-08 MED ORDER — EPINEPHRINE PF 1 MG/10ML IJ SOSY
PREFILLED_SYRINGE | INTRAMUSCULAR | Status: AC
  Filled 2018-06-08: qty 10

## 2018-06-08 NOTE — H&P (Signed)
Primary Care Physician:  [name], FNP Primary Gastroenterologist:  Dr. Oneida Alar  Pre-Procedure History & Physical: HPI:  Clifford [name] is a 67 y.o. male here for BRBPR.  Past Medical History:  Diagnosis Date  . Alcoholism ([place])   . Anemia   . High cholesterol   . Hypertension   . Prostate cancer ([place])   . Seizures ([place])     Past Surgical History:  Procedure Laterality Date  . CIRCUMCISION    . COLONOSCOPY N/A 09/13/2016   Procedure: COLONOSCOPY;  Surgeon: Rogene Houston, MD;  [facility];  Service: Endoscopy;  Laterality: N/A;  2:15  . COLONOSCOPY N/A 04/06/2018   Procedure: COLONOSCOPY;  Surgeon: Rogene Houston, MD;  [facility];  Service: Endoscopy;  Laterality: N/A;  . FLEXIBLE SIGMOIDOSCOPY N/A 01/10/2018   Procedure: FLEXIBLE SIGMOIDOSCOPY;  Surgeon: Rogene Houston, MD;  [facility];  Service: Endoscopy;  Laterality: N/A;  1:55  . HEMORRHOID SURGERY N/A 04/09/2018   Procedure: HEMORRHOIDECTOMY;  Surgeon: Aviva Signs, MD;  Location: [facility];  Service: General;  Laterality: N/A;  . NO PAST SURGERIES    . POLYPECTOMY  04/06/2018   Procedure: POLYPECTOMY;  Surgeon: Rogene Houston, MD;  [facility];  Service: Endoscopy;;  sigmoid  . PROSTATE BIOPSY      Prior to Admission medications   Medication Sig Start Date End Date Taking? Authorizing Provider  amLODipine (NORVASC) 10 MG tablet Take 10 mg by mouth daily.   Yes [provider]  docusate sodium (STOOL SOFTENER) 100 MG capsule Take 100 mg by mouth 2 (two) times daily.   Yes [provider]  HYDROcodone-acetaminophen (NORCO) 5-325 MG tablet Take 1 tablet by mouth every 4 (four) hours as needed for moderate pain. 04/09/18  Yes Aviva Signs, MD  loratadine (CLARITIN) 10 MG tablet Take 10 mg by mouth daily.   Yes [provider]  Multiple Vitamin (MULTIVITAMIN WITH MINERALS) TABS tablet Take 1 tablet by mouth daily.   Yes [provider]  pantoprazole (PROTONIX) 40 MG tablet Take 40 mg by mouth daily.   Yes [provider]  acetaminophen (TYLENOL) 500 MG tablet Take 500 mg by mouth every 6 (six) hours as needed for moderate pain or headache.     [provider]    Allergies as of 06/06/2018 - Review Complete 06/06/2018  Allergen Reaction Noted  . Lisinopril Swelling 09/11/2016    Family History  Problem Relation Age of Onset  . Alzheimer's disease Mother   . Hypertension Mother   . Hypertension Father   . Stroke Father   . Prostate cancer Father   . Hypertension Brother     Social History   Socioeconomic History  . Marital status: Married    Spouse name: Not on file  . Number of children: Not on file  . Years of education: Not on file  . Highest education level: Not on file  Occupational History  . Not on file  Social Needs  . Financial resource strain: Not on file  . Food insecurity:    Worry: Not on file    Inability: Not on file  . Transportation needs:    Medical: Not on file    Non-medical: Not on file  Tobacco Use  . Smoking status: Never Smoker  . Smokeless tobacco: Never Used  Substance and Sexual Activity  . Alcohol use: Yes    Comment: 1-2 bottles of Gin every few days  .  Drug use: No  . Sexual activity: Not on file  Lifestyle  . Physical activity:    Days per week: Not on file    Minutes per session: Not on file  . Stress: Not on file  Relationships  . Social connections:    Talks on phone: Not on file    Gets together: Not on file    Attends religious service: Not on file    Active member of club or organization: Not on file    Attends meetings of clubs or organizations: Not on file    Relationship status: Not on file  . Intimate partner violence:    Fear of current or ex partner: Not on file    Emotionally abused: Not on file    Physically abused: Not on file    Forced sexual activity: Not on file  Other Topics Concern  . Not on file   Social History Narrative  . Not on file    Review of Systems: See HPI, otherwise negative ROS   Physical Exam: BP 139/79   Pulse 95   Temp 98.7 F (37.1 C) (Oral)   Resp 19   Ht 5\' 5"  (1.651 m)   Wt 138 lb 12.8 oz (63 kg)   SpO2 100%   BMI 23.10 kg/m  General:   Alert,  pleasant and cooperative in NAD Head:  Normocephalic and atraumatic. Neck:  Supple; Lungs:  Clear throughout to auscultation.    Heart:  Regular rate and rhythm. Abdomen:  Soft, nontender and nondistended. Normal bowel sounds, without guarding, and without rebound.   Neurologic:  Alert and  oriented x4;  grossly normal neurologically.  Impression/Plan:     BRBPR  PLAN: FLEX SIG w/ APC and/or Hemospray TODAY. DISCUSSED PROCEDURE, BENEFITS, & RISKS: < 1% chance of medication reaction, bleeding, OR perforation.

## 2018-06-08 NOTE — Progress Notes (Signed)
PROGRESS NOTE    Clifford [name]  WFU:[id number] DOB: 24-Jul-1951 DOA: 06/06/2018 PCP: [name], FNP     Brief Narrative:  67 year old man admitted from home on 7/25 after an episode of painless rectal bleeding.  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  He was actively bleeding during a sigmoidoscopy in February of this year.  But on admission was 8 and he did receive 1 unit of PRBCs.   Assessment & Plan:   Active Problems:   Rectal bleed   Rectal bleeding -Likely due to either internal hemorrhoids or his rectal telangiectasias or combination of both. -Seen by GI; per notes dated 7.26 it would appear that a flex sig will be done today. -No further bleeding overnight.  Acute blood loss anemia -Hemoglobin on admission 8.0, received 1 unit of PRBCs repeat hemoglobin with appropriate response at 9.3. -Hb further increased to 9.7 on 7/27.  Alcohol abuse -Thiamine/folate/C I [place] protocol. -No signs of withdrawal so far this admission.  Acute renal failure -Mild, presenting creatinine is 1.2, baseline creatinine is 0.8. -Cr down to 1.00 on 7/27. -Suspect due to prerenal azotemia/ATN. -Continue gentle IV hydration, recheck renal function in a.m.  Prostate cancer -Followed by urology as an outpatient, he has received radiation and completed treatment in August 2018.   DVT prophylaxis: SCDs Code Status: Full code Family Communication: Wife at bedside updated on plan of care and all questions answered Disposition Plan: Discharge home pending results of flex sig and GI recommendations.  Transfer to floor.     Consultants:   GI  Procedures:   Flexible sigmoidoscopy planned for 7/27.  Antimicrobials:  Anti-infectives (From admission, onward)   None       Subjective: In bed, no complaints. No further bleeding episodes  overnight.  Objective: Vitals:   06/07/18 1852 06/07/18 1943 06/08/18 0020 06/08/18 0631  BP: 138/69  135/73 (!) 141/72  Pulse: (!) 102  96 95  Resp: 16  16 16   Temp: (!) 100.4 F (38 C)  99.6 F (37.6 C) 99.5 F (37.5 C)  TempSrc: Oral  Oral Oral  SpO2: 100% 98% 100% 99%  Weight: 63 kg (138 lb 12.8 oz)     Height: 5\' 5"  (1.651 m)       Intake/Output Summary (Last 24 hours) at 06/08/2018 1055 Last data filed at 06/08/2018 2025 Gross per 24 hour  Intake 514.17 ml  Output 250 ml  Net 264.17 ml   Filed Weights   06/06/18 2300 06/07/18 0447 06/07/18 1852  Weight: 62.9 kg (138 lb 10.7 oz) 62.9 kg (138 lb 10.7 oz) 63 kg (138 lb 12.8 oz)    Examination:  General exam: Alert, awake, oriented x 3 Respiratory system: Clear to auscultation. Respiratory effort normal. Cardiovascular system:RRR. No murmurs, rubs, gallops. Gastrointestinal system: Abdomen is nondistended, soft and nontender. No organomegaly or masses felt. Normal bowel sounds heard. Central nervous system: Alert and oriented. No focal neurological deficits. Extremities: No C/C/E, +pedal pulses Skin: No rashes, lesions or ulcers Psychiatry: Judgement and insight appear normal. Mood & affect appropriate.      Data Reviewed: I have personally reviewed following labs and imaging studies  CBC: Recent Labs  Lab 06/06/18 1615 06/07/18 0934 06/08/18 0654  WBC 6.7 7.3 7.8  HGB 8.0* 9.3* 9.7*  HCT 25.6* 29.2* 31.0*  MCV 90.5 89.3 90.1  PLT 113* 85* 83*   Basic Metabolic Panel: Recent  Labs  Lab 06/06/18 1615 06/07/18 0934 06/08/18 0654  NA 137 137 135  K 4.2 4.5 3.8  CL 99 99 99  CO2 19* 18* 20*  GLUCOSE 97 93 106*  BUN 16 16 12   CREATININE 1.27* 1.21 1.00  CALCIUM 8.8* 9.1 9.2   GFR: Estimated Creatinine Clearance: 62.4 mL/min (by C-G formula based on SCr of 1 mg/dL). Liver Function Tests: Recent Labs  Lab 06/07/18 0934  AST 100*  ALT 41  ALKPHOS 72  BILITOT 2.0*  PROT 7.6  ALBUMIN 3.9   No  results for input(s): LIPASE, AMYLASE in the last 168 hours. No results for input(s): AMMONIA in the last 168 hours. Coagulation Profile: Recent Labs  Lab 06/08/18 0654  INR 1.01   Cardiac Enzymes: No results for input(s): CKTOTAL, CKMB, CKMBINDEX, TROPONINI in the last 168 hours. BNP (last 3 results) No results for input(s): PROBNP in the last 8760 hours. HbA1C: No results for input(s): HGBA1C in the last 72 hours. CBG: No results for input(s): GLUCAP in the last 168 hours. Lipid Profile: No results for input(s): CHOL, HDL, LDLCALC, TRIG, CHOLHDL, LDLDIRECT in the last 72 hours. Thyroid Function Tests: No results for input(s): TSH, T4TOTAL, FREET4, T3FREE, THYROIDAB in the last 72 hours. Anemia Panel: No results for input(s): VITAMINB12, FOLATE, FERRITIN, TIBC, IRON, RETICCTPCT in the last 72 hours. Urine analysis:    Component Value Date/Time   COLORURINE YELLOW 04/07/2018 [place] 04/07/2018 1015   LABSPEC 1.008 04/07/2018 1015   PHURINE 7.0 04/07/2018 1015   GLUCOSEU NEGATIVE 04/07/2018 1015   HGBUR SMALL (A) 04/07/2018 1015   BILIRUBINUR NEGATIVE 04/07/2018 1015   KETONESUR 5 (A) 04/07/2018 1015   PROTEINUR 30 (A) 04/07/2018 1015   NITRITE NEGATIVE 04/07/2018 1015   LEUKOCYTESUR NEGATIVE 04/07/2018 1015   Sepsis Labs: @LABRCNTIP (procalcitonin:4,lacticidven:4)  ) Recent Results (from the past 240 hour(s))  MRSA PCR Screening     Status: None   Collection Time: 06/06/18 10:55 PM  Result Value Ref Range Status   MRSA by PCR NEGATIVE NEGATIVE Final    Comment:        The GeneXpert MRSA Assay (FDA approved for NASAL specimens only), is one component of a comprehensive MRSA colonization surveillance program. It is not intended to diagnose MRSA infection nor to guide or monitor treatment for MRSA infections. Performed at [facility], [address], [place], [place] [address]          Radiology Studies: No results  found.      Scheduled Meds: . amLODipine  10 mg Oral Daily  . docusate sodium  100 mg Oral BID  . folic acid  1 mg Oral Daily  . loratadine  10 mg Oral Daily  . multivitamin with minerals  1 tablet Oral Daily  . multivitamin with minerals  1 tablet Oral Daily  . pantoprazole  40 mg Oral Daily  . sodium chloride flush  3 mL Intravenous Q12H  . thiamine  100 mg Oral Daily   Or  . thiamine  100 mg Intravenous Daily   Continuous Infusions: . sodium chloride 50 mL/hr at 06/07/18 1643  . sodium chloride    . sodium chloride 50 mL/hr at 06/07/18 0500     LOS: 1 day    Time spent: 25 minutes.     Lelon Frohlich, MD [facility] Pager [phone]  If 7PM-7AM, please contact night-coverage [email or web address] Password [facility] 06/08/2018, 10:55 AM

## 2018-06-08 NOTE — Op Note (Signed)
[facility] Patient Name: Clifford [name] Procedure Date: 06/08/2018 11:15 AM MRN: [id number] Date of Birth: 1951/08/02 Attending MD: Clifford Drain MD, MD CSN: [id number] Age: 67 Admit Type: Inpatient Procedure:                Flexible Sigmoidoscopy w/ EPI INJECTION/APC OF                            RECTUM Indications:              Hematochezia, PMHx: PROSTATE CA/XRT AUG 2018 IN                            [name], [place], HEMORRHOIDECTOMY MAY 2019([facility]) Providers:                Clifford Drain MD, MD, Charlsie Quest. Theda Sers RN, RN,                            Randa Spike, Technician Referring MD:             [name] Medicines:                Meperidine 25 mg IV, Midazolam 4 mg IV Complications:            No immediate complications. Estimated Blood Loss:     Estimated blood loss was minimal. Procedure:                Pre-Anesthesia Assessment:                           - Prior to the procedure, a History and Physical                            was performed, and patient medications and                            allergies were reviewed. The patient's tolerance of                            previous anesthesia was also reviewed. The risks                            and benefits of the procedure and the sedation                            options and risks were discussed with the patient.                            All questions were answered, and informed consent                            was obtained. Prior Anticoagulants: The patient has                            taken no previous anticoagulant or antiplatelet  agents. ASA Grade Assessment: II - A patient with                            mild systemic disease. After reviewing the risks                            and benefits, the patient was deemed in                            satisfactory condition to undergo the procedure.                            After obtaining informed consent, the scope was                           passed under direct vision. The [id number]-[id number]                            ([id number]) scope was introduced through the anus and                            advanced to the the descending colon. The flexible                            sigmoidoscopy was accomplished without difficulty.                            The patient tolerated the procedure fairly well.                            The quality of the bowel preparation was excellent. Scope In: 12:07:33 PM Scope Out: 12:29:02 PM Total Procedure Duration: 0 hours 21 minutes 29 seconds  Findings:      A single (solitary) ulcer was found in the distal rectum. Oozing was       present. Stigmata of recent bleeding were present. Area was successfully       injected with 5 mL of a 1:10,000 solution of epinephrine for hemostasis.       Coagulation(APC RECTUM) for hemostasis using argon plasma was successful.      Multiple small and large-mouthed diverticula were found in the       recto-sigmoid colon, sigmoid colon and descending colon.      A few small localized angioectasias with bleeding were found in the       rectum. Coagulation for hemostasis using argon plasma was successful. Impression:               - RECTAL BLEEDING DUE TO A single (solitary) ulcer                            in the distal rectum AND RADIATION PROCTITIS.                            Injected. Treated with argon plasma coagulation                            (  APC).                           - MILD Diverticulosis in the recto-sigmoid colon,                            in the sigmoid colon and in the descending colon. Moderate Sedation:      Moderate (conscious) sedation was administered by the endoscopy nurse       and supervised by the endoscopist. The following parameters were       monitored: oxygen saturation, heart rate, blood pressure, and response       to care. Total physician intraservice time was 29 minutes. Recommendation:           - Continue  present medications.                           - Cardiac diet.                           - Repeat flexible sigmoidoscopy in 2 months for                            surveillance.                           - Return patient to hospital ward for ongoing care. Procedure Code(s):        --- Professional ---                           [id number], Sigmoidoscopy, flexible; with control of                            bleeding, any method                           G0500, Moderate sedation services provided by the                            same physician or other qualified health care                            professional performing a gastrointestinal                            endoscopic service that sedation supports,                            requiring the presence of an independent trained                            observer to assist in the monitoring of the                            patient's level of consciousness and physiological  status; initial 15 minutes of intra-service time;                            patient age 1 years or older (additional time may                            be reported with [phone], as appropriate)                           [phone], Moderate sedation services provided by the                            same physician or other qualified health care                            professional performing the diagnostic or                            therapeutic service that the sedation supports,                            requiring the presence of an independent trained                            observer to assist in the monitoring of the                            patient's level of consciousness and physiological                            status; each additional 15 minutes intraservice                            time (List separately in addition to code for                            primary service) Diagnosis Code(s):        --- Professional ---                            K62.6, Ulcer of anus and rectum                           K92.1, Melena (includes Hematochezia)                           K57.30, Diverticulosis of large intestine without                            perforation or abscess without bleeding CPT copyright 2017 [employer]. All rights reserved. The codes documented in this report are preliminary and upon coder review may  be revised to meet current compliance requirements. Clifford Drain, MD Clifford Drain MD, MD 06/08/2018 1:00:50 PM This report has been signed electronically. Number of Addenda: 0

## 2018-06-09 ENCOUNTER — Telehealth: Payer: Self-pay | Admitting: Gastroenterology

## 2018-06-09 LAB — CBC
HEMATOCRIT: 28.2 % — AB (ref 39.0–52.0)
HEMOGLOBIN: 9.2 g/dL — AB (ref 13.0–17.0)
MCH: 28.8 pg (ref 26.0–34.0)
MCHC: 32.6 g/dL (ref 30.0–36.0)
MCV: 88.4 fL (ref 78.0–100.0)
Platelets: 84 10*3/uL — ABNORMAL LOW (ref 150–400)
RBC: 3.19 MIL/uL — ABNORMAL LOW (ref 4.22–5.81)
RDW: 16.5 % — ABNORMAL HIGH (ref 11.5–15.5)
WBC: 9.3 10*3/uL (ref 4.0–10.5)

## 2018-06-09 MED ORDER — THIAMINE HCL 100 MG PO TABS: 100.0000 mg | ORAL_TABLET | Freq: Every day | ORAL | Status: DC

## 2018-06-09 MED ORDER — FOLIC ACID 1 MG PO TABS: 1.0000 mg | ORAL_TABLET | Freq: Every day | ORAL | Status: DC

## 2018-06-09 NOTE — Progress Notes (Signed)
Pt discharged home today per Dr. Jerilee Hoh. Pt's IV site D/C'd and WDL. Pt's VSS. Pt provided with home medication list, discharge instructions and prescriptions. Verbalized understanding. Pt left floor via WC in stable condition accompanied by NT and family.

## 2018-06-09 NOTE — Discharge Summary (Signed)
Physician Discharge Summary  Clifford [name] OEV:[id number] DOB: 01-May-1951 DOA: 06/06/2018  PCP: [name], FNP  Admit date: 06/06/2018 Discharge date: 06/09/2018  Time spent: 45 minutes  Recommendations for Outpatient Follow-up:  -Will be discharged home today. -Advised to follow up with PCP in 2 weeks.   Discharge Diagnoses:  Active Problems:   Rectal bleed   Discharge Condition: Stable and improved  Filed Weights   06/06/18 2300 06/07/18 0447 06/07/18 1852  Weight: 62.9 kg (138 lb 10.7 oz) 62.9 kg (138 lb 10.7 oz) 63 kg (138 lb 12.8 oz)    History of present illness:  As per Dr. Darrick Meigs on 7/25: Clifford [name]  is a 67 y.o. 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 Arnoldo Morale came to hospital with lower GI bleed.  Patient says that he has been passing blood with stool for past 2 weeks.  He generally feels weak but denies passing out.  He denies nausea and vomiting.  He had one episode of vomiting yesterday but denies vomiting any blood.  He denies diarrhea.  No abdominal pain.  No fever or chills.  He denies chest pain or shortness of breath. In the ED hemoglobin was found to be 8.0 dropped from 10.6 in May 2019 Also had creatinine of 1.27 last creatinine was 0.81 from 04/08/2018. Denies previous history of stroke or seizures. Denies dysuria urgency or frequency of urination.    Hospital Course:   Rectal bleeding. -Flex sig on 7/27: Rectal bleeding due to a single solitary ulcer in the distal rectum and radiation proctitis, injected, treated with argon plasma coagulation, mild diverticulosis in the rectosigmoid colon. -No further bleeding overnight. -Per GI, ok for DC home.  Acute blood loss  anemia -Hemoglobin on admission 8.0, received 1 unit of PRBCs repeat hemoglobin with appropriate response at 9.3. -Hb remains stable in the mid 9 range on DC.  Alcohol abuse -Thiamine/folate/C I [place] protocol. -No signs of withdrawal so far this admission.  Acute renal failure -Mild, presenting creatinine is 1.2, baseline creatinine is 0.8. -Cr down to 1.00 on DC. -Suspect due to prerenal azotemia/ATN.  Prostate cancer -Followed by urology as an outpatient, he has received radiation and completed treatment in August 2018.      Procedures:  Flex Sig as above   Consultations:  GI  Discharge Instructions  Discharge Instructions    Diet - low sodium heart healthy   Complete by:  As directed    Increase activity slowly   Complete by:  As directed      Allergies as of 06/09/2018      Reactions   Lisinopril Swelling      Medication List    TAKE these medications   acetaminophen 500 MG tablet Commonly known as:  TYLENOL Take 500 mg by mouth every 6 (six) hours as needed for moderate pain or headache.   amLODipine 10 MG tablet Commonly known as:  NORVASC Take 10 mg by mouth daily.   folic acid 1 MG tablet Commonly known as:  FOLVITE Take 1 tablet (1 mg total) by mouth daily. Start taking on:  06/10/2018   HYDROcodone-acetaminophen 5-325 MG tablet Commonly known as:  NORCO Take 1 tablet by mouth every 4 (four) hours as needed for moderate pain.   loratadine 10  MG tablet Commonly known as:  CLARITIN Take 10 mg by mouth daily.   multivitamin with minerals Tabs tablet Take 1 tablet by mouth daily.   pantoprazole 40 MG tablet Commonly known as:  PROTONIX Take 40 mg by mouth daily.   STOOL SOFTENER 100 MG capsule Generic drug:  docusate sodium Take 100 mg by mouth 2 (two) times daily.   thiamine 100 MG tablet Take 1 tablet (100 mg total) by mouth daily. Start taking on:  06/10/2018      Allergies  Allergen Reactions  . Lisinopril Swelling    Follow-up Information    [name], FNP. Schedule an appointment as soon as possible for a visit in 2 week(s).   Specialty:  Family Medicine Contact information: [address] [place] [address] [phone]            The results of significant diagnostics from this hospitalization (including imaging, microbiology, ancillary and laboratory) are listed below for reference.    Significant Diagnostic Studies: No results found.  Microbiology: Recent Results (from the past 240 hour(s))  MRSA PCR Screening     Status: None   Collection Time: 06/06/18 10:55 PM  Result Value Ref Range Status   MRSA by PCR NEGATIVE NEGATIVE Final    Comment:        The GeneXpert MRSA Assay (FDA approved for NASAL specimens only), is one component of a comprehensive MRSA colonization surveillance program. It is not intended to diagnose MRSA infection nor to guide or monitor treatment for MRSA infections. Performed at [facility], [address], [place], [place] [address]      Labs: Basic Metabolic Panel: Recent Labs  Lab 06/06/18 1615 06/07/18 0934 06/08/18 0654  NA 137 137 135  K 4.2 4.5 3.8  CL 99 99 99  CO2 19* 18* 20*  GLUCOSE 97 93 106*  BUN 16 16 12   CREATININE 1.27* 1.21 1.00  CALCIUM 8.8* 9.1 9.2   Liver Function Tests: Recent Labs  Lab 06/07/18 0934  AST 100*  ALT 41  ALKPHOS 72  BILITOT 2.0*  PROT 7.6  ALBUMIN 3.9   No results for input(s): LIPASE, AMYLASE in the last 168 hours. No results for input(s): AMMONIA in the last 168 hours. CBC: Recent Labs  Lab 06/06/18 1615 06/07/18 0934 06/08/18 0654 06/09/18 0531  WBC 6.7 7.3 7.8 9.3  HGB 8.0* 9.3* 9.7* 9.2*  HCT 25.6* 29.2* 31.0* 28.2*  MCV 90.5 89.3 90.1 88.4  PLT 113* 85* 83* 84*   Cardiac Enzymes: No results for input(s): CKTOTAL, CKMB, CKMBINDEX, TROPONINI in the last 168 hours. BNP: BNP (last 3 results) No results for input(s): BNP in the last 8760 hours.  ProBNP (last 3  results) No results for input(s): PROBNP in the last 8760 hours.  CBG: No results for input(s): GLUCAP in the last 168 hours.     Signed:  Lelon Frohlich  [facility] Pager: [phone] 06/09/2018, 11:34 AM

## 2018-06-09 NOTE — Telephone Encounter (Signed)
PT SEEN AND EXAMINED ON JUL 28. OLD BLOOD FROM RECTUM. NO BRBPR. NEED OPV W/ DR.REHMAN IN 4-6 WEEKS, DX: RECTAL ULCER/CONSTIPATION/RADIATION/PROCTITIS AND TO DISCUSS BENEFITS V. RISKS OF REPEAT FLEX SIG TO ASSESS ULCER AND BIOPSY. PATIENT/WIFE VOICED THEIR UNDERSTANDING.

## 2018-06-09 NOTE — Telephone Encounter (Signed)
Pt needs ov in 4 weeks. CBC and LFTs prior to OV

## 2018-06-11 ENCOUNTER — Ambulatory Visit (INDEPENDENT_AMBULATORY_CARE_PROVIDER_SITE_OTHER): Payer: Medicare Other | Admitting: Internal Medicine

## 2018-06-12 ENCOUNTER — Encounter (HOSPITAL_COMMUNITY): Payer: Self-pay | Admitting: Gastroenterology

## 2018-06-24 ENCOUNTER — Emergency Department (HOSPITAL_COMMUNITY): Payer: Medicare Other

## 2018-06-24 ENCOUNTER — Emergency Department (HOSPITAL_COMMUNITY)
Admission: EM | Admit: 2018-06-24 | Discharge: 2018-06-24 | Disposition: A | Discharge status: Home / Self Care | Payer: Medicare Other | Attending: Emergency Medicine | Admitting: Emergency Medicine

## 2018-06-24 ENCOUNTER — Other Ambulatory Visit: Payer: Self-pay

## 2018-06-24 ENCOUNTER — Encounter (HOSPITAL_COMMUNITY): Payer: Self-pay | Admitting: Emergency Medicine

## 2018-06-24 DIAGNOSIS — Z79899 Other long term (current) drug therapy: Secondary | ICD-10-CM | POA: Diagnosis not present

## 2018-06-24 DIAGNOSIS — D649 Anemia, unspecified: Secondary | ICD-10-CM | POA: Insufficient documentation

## 2018-06-24 DIAGNOSIS — R195 Other fecal abnormalities: Secondary | ICD-10-CM

## 2018-06-24 DIAGNOSIS — I10 Essential (primary) hypertension: Secondary | ICD-10-CM | POA: Diagnosis not present

## 2018-06-24 LAB — CBC WITH DIFFERENTIAL/PLATELET
Basophils Absolute: 0 10*3/uL (ref 0.0–0.1)
Basophils Relative: 0 %
EOS ABS: 0.3 10*3/uL (ref 0.0–0.7)
Eosinophils Relative: 2 %
HEMATOCRIT: 27.9 % — AB (ref 39.0–52.0)
HEMOGLOBIN: 8.6 g/dL — AB (ref 13.0–17.0)
LYMPHS ABS: 1.1 10*3/uL (ref 0.7–4.0)
Lymphocytes Relative: 8 %
MCH: 28.1 pg (ref 26.0–34.0)
MCHC: 30.8 g/dL (ref 30.0–36.0)
MCV: 91.2 fL (ref 78.0–100.0)
MONO ABS: 1 10*3/uL (ref 0.1–1.0)
Monocytes Relative: 7 %
NEUTROS PCT: 83 %
Neutro Abs: 11.7 10*3/uL — ABNORMAL HIGH (ref 1.7–7.7)
Platelets: 395 10*3/uL (ref 150–400)
RBC: 3.06 MIL/uL — ABNORMAL LOW (ref 4.22–5.81)
RDW: 15.7 % — AB (ref 11.5–15.5)
WBC: 14.2 10*3/uL — ABNORMAL HIGH (ref 4.0–10.5)

## 2018-06-24 LAB — HEPATIC FUNCTION PANEL
ALK PHOS: 73 U/L (ref 38–126)
ALT: 24 U/L (ref 0–44)
AST: 57 U/L — ABNORMAL HIGH (ref 15–41)
Albumin: 3.1 g/dL — ABNORMAL LOW (ref 3.5–5.0)
BILIRUBIN INDIRECT: 0.5 mg/dL (ref 0.3–0.9)
BILIRUBIN TOTAL: 0.7 mg/dL (ref 0.3–1.2)
Bilirubin, Direct: 0.2 mg/dL (ref 0.0–0.2)
Total Protein: 6.9 g/dL (ref 6.5–8.1)

## 2018-06-24 LAB — BASIC METABOLIC PANEL
Anion gap: 13 (ref 5–15)
BUN: 13 mg/dL (ref 8–23)
CHLORIDE: 104 mmol/L (ref 98–111)
CO2: 19 mmol/L — AB (ref 22–32)
CREATININE: 1.27 mg/dL — AB (ref 0.61–1.24)
Calcium: 8.9 mg/dL (ref 8.9–10.3)
GFR calc Af Amer: >60 mL/min (ref >60)
GFR calc non Af Amer: 57 mL/min — ABNORMAL LOW (ref >60)
Glucose, Bld: 107 mg/dL — ABNORMAL HIGH (ref 70–99)
Potassium: 3.8 mmol/L (ref 3.5–5.1)
SODIUM: 136 mmol/L (ref 135–145)

## 2018-06-24 MED ORDER — SODIUM CHLORIDE 0.9 % IV SOLN
1000.0000 mL | INTRAVENOUS | Status: DC
  Administered 2018-06-24: 1000 mL via INTRAVENOUS

## 2018-06-24 MED ORDER — SODIUM CHLORIDE 0.9 % IV BOLUS (SEPSIS)
500.0000 mL | Freq: Once | INTRAVENOUS | Status: AC
  Administered 2018-06-24: 500 mL via INTRAVENOUS

## 2018-06-24 NOTE — ED Triage Notes (Signed)
Patient sent from [facility] for abnormal labs. Paperwork from facility shows WBC 17.1 and sodium 129. Patient complaining of weakness x 2 weeks.

## 2018-06-24 NOTE — ED Provider Notes (Signed)
[facility] EMERGENCY DEPARTMENT Provider Note   CSN: [id number] Arrival date & time: 06/24/18  1348     History   Chief Complaint Chief Complaint  Patient presents with  . Abnormal Lab    HPI Clifford [name] is a 67 y.o. male.  HPI Patient was recently discharged from the hospital.  He was admitted on July 25 and discharged on July 28 after having rectal bleeding.  Symptoms were attributed to a solitary ulcer in the distal rectum related to radiation proctitis.  Patient had a follow-up appointment with his primary care doctor last Thursday for generalized fatigue and weakness.  He had laboratory tests drawn.  She states over the weekend he is slowly improved.  He is now feeling less fatigued.  He is not having any issues with his appetite.  No vomiting or diarrhea.  Patient's family member state they were called today and told that his white blood cell count was elevated, his hemoglobin was decreased and his sodium was low.  They instructed him to come to the emergency room. Past Medical History:  Diagnosis Date  . Alcoholism ([place])   . Anemia   . High cholesterol   . Hypertension   . Prostate cancer ([place])   . Seizures [facility])     Patient Active Problem List   Diagnosis Date Noted  . Rectal bleed 06/06/2018  . Bleeding hemorrhoid   . Thrombocytopenia ([place]) 04/08/2018  . Fever 04/07/2018  . Bleeding internal hemorrhoids   . GI bleed 04/06/2018  . Acute blood loss anemia 04/05/2018  . Acute lower GI bleeding 04/05/2018  . Rectal bleeding 12/06/2017  . Malignant neoplasm of prostate ([place]) 12/04/2016  . Anemia in other chronic diseases classified elsewhere 07/11/2016  . Alcoholism ([place]) 07/04/2016  . Seizures ([place]) 07/04/2016  . Hyperlipidemia 07/04/2016  . Essential hypertension 07/04/2016    Past Surgical History:  Procedure Laterality Date  . CIRCUMCISION    . COLONOSCOPY N/A 09/13/2016   Procedure: COLONOSCOPY;  Surgeon: Rogene Houston, MD;  Location: [facility] ENDO  SU[facility];  Service: Endoscopy;  Laterality: N/A;  2:15  . COLONOSCOPY N/A 04/06/2018   Procedure: COLONOSCOPY;  Surgeon: Rogene Houston, MD;  Location: [facility] ENDO SUITE;  Service: Endoscopy;  Laterality: N/A;  . FLEXIBLE SIGMOIDOSCOPY N/A 01/10/2018   Procedure: FLEXIBLE SIGMOIDOSCOPY;  Surgeon: Rogene Houston, MD;  Location: [facility] ENDO SUITE;  Service: Endoscopy;  Laterality: N/A;  1:55  . FLEXIBLE SIGMOIDOSCOPY N/A 06/08/2018   Procedure: FLEXIBLE SIGMOIDOSCOPY;  Surgeon: Danie Binder, MD;  Location: [facility] ENDO SUITE;  Service: Endoscopy;  Laterality: N/A;  . HEMORRHOID SURGERY N/A 04/09/2018   Procedure: HEMORRHOIDECTOMY;  Surgeon: Aviva Signs, MD;  Location: [facility] ORS;  Service: General;  Laterality: N/A;  . NO PAST SURGERIES    . POLYPECTOMY  04/06/2018   Procedure: POLYPECTOMY;  Surgeon: Rogene Houston, MD;  Location: [facility] ENDO SU[facility];  Service: Endoscopy;;  sigmoid  . PROSTATE BIOPSY          Home Medications    Prior to Admission medications   Medication Sig Start Date End Date Taking? Authorizing Provider  amLODipine (NORVASC) 10 MG tablet Take 10 mg by mouth daily.   Yes [provider]  B-COMPLEX-C PO Take 1 tablet by mouth every morning.   Yes [provider]  docusate sodium (STOOL SOFTENER) 100 MG capsule Take 100 mg by mouth every morning.    Yes [provider]  Multiple Vitamin (MULTIVITAMIN WITH MINERALS) TABS tablet Take 1  tablet by mouth daily.   Yes [provider]  pantoprazole (PROTONIX) 40 MG tablet Take 40 mg by mouth daily.   Yes [provider]  folic acid (FOLVITE) 1 MG tablet Take 1 tablet (1 mg total) by mouth daily. Patient not taking: Reported on 06/24/2018 06/10/18   [name], Rayford Halsted, MD  thiamine 100 MG tablet Take 1 tablet (100 mg total) by mouth daily. Patient not taking: Reported on 06/24/2018 06/10/18   [name], Rayford Halsted, MD    Family History Family History  Problem Relation Age of Onset  .  Alzheimer's disease Mother   . Hypertension Mother   . Hypertension Father   . Stroke Father   . Prostate cancer Father   . Hypertension Brother     Social History Social History   Tobacco Use  . Smoking status: Never Smoker  . Smokeless tobacco: Never Used  Substance Use Topics  . Alcohol use: Yes    Comment: 1-2 bottles of Gin every few days  . Drug use: No     Allergies   Lisinopril   Review of Systems Review of Systems  All other systems reviewed and are negative.    Physical Exam Updated Vital Signs BP 132/72   Pulse 96   Temp 98.2 F (36.8 C) (Oral)   Resp 18   Ht 1.651 m (5\' 5" )   Wt 64.4 kg   SpO2 93%   BMI 23.63 kg/m   Physical Exam  Constitutional: No distress.  HENT:  Head: Normocephalic and atraumatic.  Right Ear: External ear normal.  Left Ear: External ear normal.  Eyes: Conjunctivae are normal. Right eye exhibits no discharge. Left eye exhibits no discharge. No scleral icterus.  Neck: Neck supple. No tracheal deviation present.  Cardiovascular: Normal rate, regular rhythm and intact distal pulses.  Pulmonary/Chest: Effort normal and breath sounds normal. No stridor. No respiratory distress. He has no wheezes. He has no rales.  Abdominal: Soft. Bowel sounds are normal. He exhibits no distension. There is no tenderness. There is no rebound and no guarding.  Musculoskeletal: He exhibits no edema or tenderness.  Neurological: He is alert. He has normal strength. No cranial nerve deficit (no facial droop, extraocular movements intact, no slurred speech) or sensory deficit. He exhibits normal muscle tone. He displays no seizure activity. Coordination normal.  Skin: Skin is warm and dry. No rash noted. He is not diaphoretic.  Psychiatric: He has a normal mood and affect.  Nursing note and vitals reviewed.    ED Treatments / Results  Labs (all labs ordered are listed, but only abnormal results are displayed) Labs Reviewed  CBC WITH  DIFFERENTIAL/PLATELET - Abnormal; Notable for the following components:      Result Value   WBC 14.2 (*)    RBC 3.06 (*)    Hemoglobin 8.6 (*)    HCT 27.9 (*)    RDW 15.7 (*)    Neutro Abs 11.7 (*)    All other components within normal limits  BASIC METABOLIC PANEL - Abnormal; Notable for the following components:   CO2 19 (*)    Glucose, Bld 107 (*)    Creatinine, Ser 1.27 (*)    GFR calc non Af Amer 57 (*)    All other components within normal limits  HEPATIC FUNCTION PANEL - Abnormal; Notable for the following components:   Albumin 3.1 (*)    AST 57 (*)    All other components within normal limits  URINALYSIS, ROUTINE  W REFLEX MICROSCOPIC  POC OCCULT BLOOD, ED    EKG None  Radiology Dg Chest 2 View  Result Date: 06/24/2018 CLINICAL DATA:  Cough and weakness. EXAM: CHEST - 2 VIEW COMPARISON:  Chest x-ray dated Apr 05, 2018. FINDINGS: The heart remains borderline enlarged. No obscuration of the left heart border. Normal pulmonary vascularity. New moderate left pleural effusion with lingular and left lower lobe atelectasis. The right lung is clear. No pneumothorax. No acute osseous abnormality. IMPRESSION: 1. New moderate left pleural effusion with lingular and left lower lobe atelectasis. Electronically Signed   By: Titus Dubin M.D.   On: 06/24/2018 16:13    Procedures Procedures (including critical care time)  Medications Ordered in ED Medications  sodium chloride 0.9 % bolus 500 mL (0 mLs Intravenous Stopped 06/24/18 1655)    Followed by  0.9 %  sodium chloride infusion (1,000 mLs Intravenous New Bag/Given 06/24/18 1527)     Initial Impression / Assessment and Plan / ED Course  I have reviewed the triage vital signs and the nursing notes.  Pertinent labs & imaging results that were available during my care of the patient were reviewed by me and considered in my medical decision making (see chart for details).  Clinical Course as of Jun 25 1835  Mon Jun 24, 2018    1827 Patient's labs are notable for a decline in his hemoglobin as well as an elevation white blood cell count.  Was able to compare this to his recent outpatient laboratory test.  His white blood cell count is decreasing from last week.  His hemoglobin is slightly lower.  His outpatient laboratory test showed hyponatremia but is no longer hyponatremic.   [JK]  1827 Rectal exams do show guaiac positive stools of his stool is brown in color without melena or gross blood   [JK]  1833 D/w Dr Gala Romney.   Reviewed blood tests.  Would have the patient have blood count rechecked in 1 week.  No indication for transfusion or admission at this time   [JK]    Clinical Course User Index [JK] Dorie Rank, MD    Patient presented to the emergency room for reevaluation because of abnormal outpatient laboratory test last Thursday.  He had an elevated white blood cell count of 17,000 as well as hyponatremia with a sodium in the 120s.  Patient states that since that time he is felt better.  He denies any specific complaints today.  He has not had fevers.  No blood in his stool.  No vomiting or diarrhea.  No abdominal pain.  Patient's laboratory tests today were notable for an elevated white blood cell count of 14,000 but this appears to be improving.  He also does have a slight drop in his hemoglobin to 8.6 when his last was 9.2.  He does not have any melena or bright red rectal bleeding however he his stools are guaiac positive.  I reviewed his recent hospital admission and the results of his flexible sigmoidoscopy.  I discussed case with Dr. Gala Romney.  The patient not require admission at this time.  He should have his blood count rechecked in 1 week.  If he starts having any bright red blood or dark melena stools he will need to return to the emergency room immediately.  Final Clinical Impressions(s) / ED Diagnoses   Final diagnoses:  Anemia, unspecified type  Occult blood positive stool    ED Discharge Orders     None  Dorie Rank, MD 06/24/18 [phone]

## 2018-06-24 NOTE — Discharge Instructions (Signed)
Hello up with your primary care doctor and/or Dr. Laural Golden, have your blood count rechecked in 1 week, return to the emergency room he started having fevers, dark black stools or bright red blood in your stool

## 2018-07-05 ENCOUNTER — Inpatient Hospital Stay (HOSPITAL_COMMUNITY)
Admission: EM | Admit: 2018-07-05 | Discharge: 2018-07-12 | DRG: 853 | Disposition: A | Discharge status: Home Health | Payer: Medicare Other | Attending: Internal Medicine | Admitting: Internal Medicine

## 2018-07-05 ENCOUNTER — Emergency Department (HOSPITAL_COMMUNITY): Payer: Medicare Other

## 2018-07-05 ENCOUNTER — Encounter (HOSPITAL_COMMUNITY): Payer: Self-pay | Admitting: Emergency Medicine

## 2018-07-05 ENCOUNTER — Inpatient Hospital Stay (HOSPITAL_COMMUNITY): Payer: Medicare Other

## 2018-07-05 ENCOUNTER — Other Ambulatory Visit: Payer: Self-pay

## 2018-07-05 DIAGNOSIS — J90 Pleural effusion, not elsewhere classified: Secondary | ICD-10-CM | POA: Diagnosis not present

## 2018-07-05 DIAGNOSIS — Z888 Allergy status to other drugs, medicaments and biological substances status: Secondary | ICD-10-CM

## 2018-07-05 DIAGNOSIS — Z9689 Presence of other specified functional implants: Secondary | ICD-10-CM

## 2018-07-05 DIAGNOSIS — J918 Pleural effusion in other conditions classified elsewhere: Secondary | ICD-10-CM | POA: Diagnosis present

## 2018-07-05 DIAGNOSIS — I10 Essential (primary) hypertension: Secondary | ICD-10-CM | POA: Diagnosis present

## 2018-07-05 DIAGNOSIS — Z9889 Other specified postprocedural states: Secondary | ICD-10-CM

## 2018-07-05 DIAGNOSIS — R0602 Shortness of breath: Secondary | ICD-10-CM | POA: Diagnosis present

## 2018-07-05 DIAGNOSIS — E876 Hypokalemia: Secondary | ICD-10-CM | POA: Diagnosis not present

## 2018-07-05 DIAGNOSIS — A4159 Other Gram-negative sepsis: Secondary | ICD-10-CM | POA: Diagnosis present

## 2018-07-05 DIAGNOSIS — E785 Hyperlipidemia, unspecified: Secondary | ICD-10-CM | POA: Diagnosis present

## 2018-07-05 DIAGNOSIS — E872 Acidosis: Secondary | ICD-10-CM | POA: Diagnosis present

## 2018-07-05 DIAGNOSIS — R000 Tachycardia, unspecified: Secondary | ICD-10-CM

## 2018-07-05 DIAGNOSIS — J9601 Acute respiratory failure with hypoxia: Secondary | ICD-10-CM | POA: Diagnosis present

## 2018-07-05 DIAGNOSIS — A419 Sepsis, unspecified organism: Secondary | ICD-10-CM

## 2018-07-05 DIAGNOSIS — D6489 Other specified anemias: Secondary | ICD-10-CM | POA: Diagnosis present

## 2018-07-05 DIAGNOSIS — J690 Pneumonitis due to inhalation of food and vomit: Secondary | ICD-10-CM | POA: Diagnosis present

## 2018-07-05 DIAGNOSIS — Z8546 Personal history of malignant neoplasm of prostate: Secondary | ICD-10-CM

## 2018-07-05 DIAGNOSIS — Z09 Encounter for follow-up examination after completed treatment for conditions other than malignant neoplasm: Secondary | ICD-10-CM

## 2018-07-05 DIAGNOSIS — Z8042 Family history of malignant neoplasm of prostate: Secondary | ICD-10-CM | POA: Diagnosis not present

## 2018-07-05 DIAGNOSIS — R6520 Severe sepsis without septic shock: Secondary | ICD-10-CM | POA: Diagnosis present

## 2018-07-05 DIAGNOSIS — C61 Malignant neoplasm of prostate: Secondary | ICD-10-CM | POA: Diagnosis present

## 2018-07-05 DIAGNOSIS — E7800 Pure hypercholesterolemia, unspecified: Secondary | ICD-10-CM | POA: Diagnosis present

## 2018-07-05 DIAGNOSIS — J869 Pyothorax without fistula: Secondary | ICD-10-CM

## 2018-07-05 DIAGNOSIS — F1020 Alcohol dependence, uncomplicated: Secondary | ICD-10-CM | POA: Diagnosis present

## 2018-07-05 DIAGNOSIS — R0600 Dyspnea, unspecified: Secondary | ICD-10-CM

## 2018-07-05 DIAGNOSIS — R569 Unspecified convulsions: Secondary | ICD-10-CM

## 2018-07-05 DIAGNOSIS — N179 Acute kidney failure, unspecified: Secondary | ICD-10-CM | POA: Diagnosis present

## 2018-07-05 LAB — GLUCOSE, PLEURAL OR PERITONEAL FLUID: Glucose, Fluid: 7 mg/dL

## 2018-07-05 LAB — PROTIME-INR
INR: 1.1
PROTHROMBIN TIME: 14.2 s (ref 11.4–15.2)

## 2018-07-05 LAB — COMPREHENSIVE METABOLIC PANEL
ALBUMIN: 3.2 g/dL — AB (ref 3.5–5.0)
ALT: 19 U/L (ref 0–44)
ANION GAP: 17 — AB (ref 5–15)
AST: 57 U/L — AB (ref 15–41)
Alkaline Phosphatase: 87 U/L (ref 38–126)
BILIRUBIN TOTAL: 1.2 mg/dL (ref 0.3–1.2)
BUN: 15 mg/dL (ref 8–23)
CO2: 18 mmol/L — AB (ref 22–32)
Calcium: 8.8 mg/dL — ABNORMAL LOW (ref 8.9–10.3)
Chloride: 101 mmol/L (ref 98–111)
Creatinine, Ser: 1.5 mg/dL — ABNORMAL HIGH (ref 0.61–1.24)
GFR calc Af Amer: 54 mL/min — ABNORMAL LOW (ref >60)
GFR calc non Af Amer: 46 mL/min — ABNORMAL LOW (ref >60)
GLUCOSE: 158 mg/dL — AB (ref 70–99)
Potassium: 4.3 mmol/L (ref 3.5–5.1)
SODIUM: 136 mmol/L (ref 135–145)
Total Protein: 7.4 g/dL (ref 6.5–8.1)

## 2018-07-05 LAB — PROTEIN, PLEURAL OR PERITONEAL FLUID: Total protein, fluid: 4.1 g/dL

## 2018-07-05 LAB — CBC WITH DIFFERENTIAL/PLATELET
Basophils Absolute: 0 10*3/uL (ref 0.0–0.1)
Basophils Relative: 0 %
Eosinophils Absolute: 0.2 10*3/uL (ref 0.0–0.7)
Eosinophils Relative: 1 %
HEMATOCRIT: 32.7 % — AB (ref 39.0–52.0)
HEMOGLOBIN: 10.2 g/dL — AB (ref 13.0–17.0)
LYMPHS ABS: 1.4 10*3/uL (ref 0.7–4.0)
LYMPHS PCT: 10 %
MCH: 28.4 pg (ref 26.0–34.0)
MCHC: 31.2 g/dL (ref 30.0–36.0)
MCV: 91.1 fL (ref 78.0–100.0)
MONOS PCT: 4 %
Monocytes Absolute: 0.5 10*3/uL (ref 0.1–1.0)
NEUTROS PCT: 85 %
Neutro Abs: 12.1 10*3/uL — ABNORMAL HIGH (ref 1.7–7.7)
Platelets: 370 10*3/uL (ref 150–400)
RBC: 3.59 MIL/uL — AB (ref 4.22–5.81)
RDW: 15.9 % — ABNORMAL HIGH (ref 11.5–15.5)
WBC: 14.3 10*3/uL — AB (ref 4.0–10.5)

## 2018-07-05 LAB — INFLUENZA PANEL BY PCR (TYPE A & B)
INFLAPCR: NEGATIVE
Influenza B By PCR: NEGATIVE

## 2018-07-05 LAB — I-STAT CG4 LACTIC ACID, ED: Lactic Acid, Venous: 3.85 mmol/L (ref 0.5–1.9)

## 2018-07-05 LAB — BODY FLUID CELL COUNT WITH DIFFERENTIAL
EOS FL: 0 %
Lymphs, Fluid: 4 %
Monocyte-Macrophage-Serous Fluid: 0 % — ABNORMAL LOW (ref 50–90)
NEUTROPHIL FLUID: 96 % — AB (ref 0–25)
WBC FLUID: 100022 uL — AB (ref 0–1000)

## 2018-07-05 LAB — GRAM STAIN: Gram Stain: NONE SEEN

## 2018-07-05 LAB — TYPE AND SCREEN
ABO/RH(D): A POS
Antibody Screen: NEGATIVE

## 2018-07-05 LAB — ALBUMIN, PLEURAL OR PERITONEAL FLUID: ALBUMIN FL: 2.1 g/dL

## 2018-07-05 LAB — LACTIC ACID, PLASMA: Lactic Acid, Venous: 3.3 mmol/L (ref 0.5–1.9)

## 2018-07-05 MED ORDER — SODIUM CHLORIDE 0.9 % IV SOLN
500.0000 mg | INTRAVENOUS | Status: DC
  Administered 2018-07-05: 500 mg via INTRAVENOUS
  Filled 2018-07-05 (×4): qty 500

## 2018-07-05 MED ORDER — OXYCODONE HCL 5 MG PO TABS
5.0000 mg | ORAL_TABLET | ORAL | Status: DC | PRN
  Administered 2018-07-05 – 2018-07-06 (×3): 5 mg via ORAL
  Filled 2018-07-05 (×4): qty 1

## 2018-07-05 MED ORDER — SODIUM CHLORIDE 0.9 % IV BOLUS
1000.0000 mL | Freq: Once | INTRAVENOUS | Status: AC
  Administered 2018-07-05: 1000 mL via INTRAVENOUS

## 2018-07-05 MED ORDER — PANTOPRAZOLE SODIUM 40 MG PO TBEC
40.0000 mg | DELAYED_RELEASE_TABLET | Freq: Every day | ORAL | Status: DC
  Administered 2018-07-05 – 2018-07-12 (×7): 40 mg via ORAL
  Filled 2018-07-05 (×7): qty 1

## 2018-07-05 MED ORDER — ONDANSETRON HCL 4 MG PO TABS: 4.0000 mg | ORAL_TABLET | Freq: Four times a day (QID) | ORAL | Status: DC | PRN

## 2018-07-05 MED ORDER — ACETAMINOPHEN 500 MG PO TABS
1000.0000 mg | ORAL_TABLET | Freq: Once | ORAL | Status: AC
  Administered 2018-07-05: 1000 mg via ORAL
  Filled 2018-07-05: qty 2

## 2018-07-05 MED ORDER — SODIUM CHLORIDE 0.9 % IV SOLN
3.0000 g | Freq: Three times a day (TID) | INTRAVENOUS | Status: DC
  Administered 2018-07-05 – 2018-07-06 (×3): 3 g via INTRAVENOUS
  Filled 2018-07-05 (×11): qty 3

## 2018-07-05 MED ORDER — HEPARIN SODIUM (PORCINE) 5000 UNIT/ML IJ SOLN
5000.0000 [IU] | Freq: Three times a day (TID) | INTRAMUSCULAR | Status: AC
  Administered 2018-07-05 – 2018-07-06 (×2): 5000 [IU] via SUBCUTANEOUS
  Filled 2018-07-05 (×2): qty 1

## 2018-07-05 MED ORDER — ONDANSETRON HCL 4 MG/2ML IJ SOLN: 4.0000 mg | Freq: Four times a day (QID) | INTRAMUSCULAR | Status: DC | PRN

## 2018-07-05 MED ORDER — LORAZEPAM 2 MG/ML IJ SOLN: 0.0000 mg | Freq: Four times a day (QID) | INTRAMUSCULAR | Status: AC

## 2018-07-05 MED ORDER — LORAZEPAM 2 MG/ML IJ SOLN: 0.0000 mg | Freq: Two times a day (BID) | INTRAMUSCULAR | Status: AC

## 2018-07-05 MED ORDER — AMLODIPINE BESYLATE 5 MG PO TABS: 10.0000 mg | ORAL_TABLET | Freq: Every day | ORAL | Status: DC

## 2018-07-05 MED ORDER — LIDOCAINE-EPINEPHRINE (PF) 1 %-1:200000 IJ SOLN: 10.0000 mL | Freq: Once | INTRAMUSCULAR | Status: DC

## 2018-07-05 MED ORDER — SODIUM CHLORIDE 0.9 % IV SOLN
2.0000 g | INTRAVENOUS | Status: DC
  Administered 2018-07-05: 2 g via INTRAVENOUS
  Filled 2018-07-05 (×4): qty 20

## 2018-07-05 MED ORDER — DOCUSATE SODIUM 100 MG PO CAPS
100.0000 mg | ORAL_CAPSULE | Freq: Every morning | ORAL | Status: DC
  Administered 2018-07-06: 100 mg via ORAL
  Filled 2018-07-05: qty 1

## 2018-07-05 MED ORDER — ACETAMINOPHEN 325 MG PO TABS
650.0000 mg | ORAL_TABLET | Freq: Four times a day (QID) | ORAL | Status: DC | PRN
  Administered 2018-07-05 – 2018-07-12 (×11): 650 mg via ORAL
  Filled 2018-07-05 (×10): qty 2

## 2018-07-05 MED ORDER — SODIUM CHLORIDE 0.9 % IV SOLN
INTRAVENOUS | Status: DC
  Administered 2018-07-05 – 2018-07-06 (×2): via INTRAVENOUS

## 2018-07-05 MED ORDER — B COMPLEX-C PO TABS
1.0000 | ORAL_TABLET | Freq: Every morning | ORAL | Status: DC
  Administered 2018-07-06 – 2018-07-12 (×5): 1 via ORAL
  Filled 2018-07-05 (×6): qty 1

## 2018-07-05 MED ORDER — THIAMINE HCL 100 MG/ML IJ SOLN: 100.0000 mg | Freq: Every day | INTRAMUSCULAR | Status: DC

## 2018-07-05 MED ORDER — LORAZEPAM 2 MG/ML IJ SOLN
1.0000 mg | Freq: Four times a day (QID) | INTRAMUSCULAR | Status: AC | PRN
  Administered 2018-07-06: 1 mg via INTRAVENOUS
  Filled 2018-07-05: qty 1

## 2018-07-05 MED ORDER — SODIUM CHLORIDE 0.9 % IV SOLN
500.0000 mg | INTRAVENOUS | Status: DC
  Filled 2018-07-05 (×3): qty 500

## 2018-07-05 MED ORDER — LORAZEPAM 1 MG PO TABS: 1.0000 mg | ORAL_TABLET | Freq: Four times a day (QID) | ORAL | Status: AC | PRN

## 2018-07-05 MED ORDER — SENNOSIDES-DOCUSATE SODIUM 8.6-50 MG PO TABS: 1.0000 | ORAL_TABLET | Freq: Every evening | ORAL | Status: DC | PRN

## 2018-07-05 MED ORDER — VITAMIN B-1 100 MG PO TABS
100.0000 mg | ORAL_TABLET | Freq: Every day | ORAL | Status: DC
  Administered 2018-07-05 – 2018-07-12 (×7): 100 mg via ORAL
  Filled 2018-07-05 (×7): qty 1

## 2018-07-05 MED ORDER — ADULT MULTIVITAMIN W/MINERALS CH: 1.0000 | ORAL_TABLET | Freq: Every day | ORAL | Status: DC

## 2018-07-05 MED ORDER — ACETAMINOPHEN 650 MG RE SUPP
650.0000 mg | Freq: Four times a day (QID) | RECTAL | Status: DC | PRN
  Administered 2018-07-07: 650 mg via RECTAL
  Filled 2018-07-05: qty 1

## 2018-07-05 MED ORDER — ADULT MULTIVITAMIN W/MINERALS CH
1.0000 | ORAL_TABLET | Freq: Every day | ORAL | Status: DC
  Administered 2018-07-05 – 2018-07-12 (×7): 1 via ORAL
  Filled 2018-07-05 (×7): qty 1

## 2018-07-05 MED ORDER — LIDOCAINE-EPINEPHRINE (PF) 1 %-1:200000 IJ SOLN
INTRAMUSCULAR | Status: AC
  Filled 2018-07-05: qty 30

## 2018-07-05 MED ORDER — FOLIC ACID 1 MG PO TABS
1.0000 mg | ORAL_TABLET | Freq: Every day | ORAL | Status: DC
  Administered 2018-07-05 – 2018-07-12 (×7): 1 mg via ORAL
  Filled 2018-07-05 (×7): qty 1

## 2018-07-05 NOTE — ED Notes (Signed)
Patient was a difficult stick and after 3 failed attempts this nurse was just able to draw for the lactic acid.

## 2018-07-05 NOTE — ED Provider Notes (Signed)
[facility] EMERGENCY DEPARTMENT Provider Note   CSN: [id number] Arrival date & time: 07/05/18  1053     History   Chief Complaint Chief Complaint  Patient presents with  . Shortness of Breath    HPI Clifford [name] is a 67 y.o. male.  HPI  The patient is a 67 year old male, has a history of alcoholism, history of seizures, history of rectal bleeding and has required blood transfusions in the recent past after this.  He presents to the hospital today with shortness of breath which was acute in onset, seem to be this morning and associated with abdominal pain especially in the upper abdomen and left upper abdomen.  He has never had anything like this, he denies any history of recent coughing or fevers and states he has had no swelling of his legs.  He reports that his shortness of breath is much worse when he lays down, better when he sits up, he has been noted to be severely tachypneic by his significant other who accompanies him today.  The patient denies having ongoing rectal bleeding.  The shortness of breath is persistent severe and gradually worsening.  Past Medical History:  Diagnosis Date  . Alcoholism ([place])   . Anemia   . High cholesterol   . Hypertension   . Prostate cancer ([place])   . Seizures [facility])     Patient Active Problem List   Diagnosis Date Noted  . Pleural effusion, left 07/05/2018  . Rectal bleed 06/06/2018  . Bleeding hemorrhoid   . Thrombocytopenia ([place]) 04/08/2018  . Fever 04/07/2018  . Bleeding internal hemorrhoids   . GI bleed 04/06/2018  . Acute blood loss anemia 04/05/2018  . Acute lower GI bleeding 04/05/2018  . Rectal bleeding 12/06/2017  . Malignant neoplasm of prostate ([place]) 12/04/2016  . Anemia in other chronic diseases classified elsewhere 07/11/2016  . Alcoholism ([place]) 07/04/2016  . Seizures ([place]) 07/04/2016  . Hyperlipidemia 07/04/2016  . Essential hypertension 07/04/2016    Past Surgical History:  Procedure Laterality Date  .  CIRCUMCISION    . COLONOSCOPY N/A 09/13/2016   Procedure: COLONOSCOPY;  Surgeon: Rogene Houston, MD;  Location: [facility] ENDO SUITE;  Service: Endoscopy;  Laterality: N/A;  2:15  . COLONOSCOPY N/A 04/06/2018   Procedure: COLONOSCOPY;  Surgeon: Rogene Houston, MD;  Location: [facility] ENDO SUITE;  Service: Endoscopy;  Laterality: N/A;  . FLEXIBLE SIGMOIDOSCOPY N/A 01/10/2018   Procedure: FLEXIBLE SIGMOIDOSCOPY;  Surgeon: Rogene Houston, MD;  Location: [facility] ENDO SUITE;  Service: Endoscopy;  Laterality: N/A;  1:55  . FLEXIBLE SIGMOIDOSCOPY N/A 06/08/2018   Procedure: FLEXIBLE SIGMOIDOSCOPY;  Surgeon: Danie Binder, MD;  Location: [facility] ENDO SUITE;  Service: Endoscopy;  Laterality: N/A;  . HEMORRHOID SURGERY N/A 04/09/2018   Procedure: HEMORRHOIDECTOMY;  Surgeon: Aviva Signs, MD;  Location: [facility] ORS;  Service: General;  Laterality: N/A;  . NO PAST SURGERIES    . POLYPECTOMY  04/06/2018   Procedure: POLYPECTOMY;  Surgeon: Rogene Houston, MD;  Location: [facility] ENDO SUITE;  Service: Endoscopy;;  sigmoid  . PROSTATE BIOPSY          Home Medications    Prior to Admission medications   Medication Sig Start Date End Date Taking? Authorizing Provider  amLODipine (NORVASC) 10 MG tablet Take 10 mg by mouth daily.    [provider]  B-COMPLEX-C PO Take 1 tablet by mouth every morning.    [provider]  docusate sodium (STOOL SOFTENER) 100 MG capsule Take 100  mg by mouth every morning.     [provider]  folic acid (FOLVITE) 1 MG tablet Take 1 tablet (1 mg total) by mouth daily. Patient not taking: Reported on 06/24/2018 06/10/18   [name], Rayford Halsted, MD  Multiple Vitamin (MULTIVITAMIN WITH MINERALS) TABS tablet Take 1 tablet by mouth daily.    [provider]  pantoprazole (PROTONIX) 40 MG tablet Take 40 mg by mouth daily.    [provider]  thiamine 100 MG tablet Take 1 tablet (100 mg total) by mouth daily. Patient not taking: Reported on 06/24/2018 06/10/18    [name], Rayford Halsted, MD    Family History Family History  Problem Relation Age of Onset  . Alzheimer's disease Mother   . Hypertension Mother   . Hypertension Father   . Stroke Father   . Prostate cancer Father   . Hypertension Brother     Social History Social History   Tobacco Use  . Smoking status: Never Smoker  . Smokeless tobacco: Never Used  Substance Use Topics  . Alcohol use: Yes    Comment: 1-2 bottles of Gin every few days  . Drug use: No     Allergies   Lisinopril   Review of Systems Review of Systems  All other systems reviewed and are negative.    Physical Exam Updated Vital Signs BP (!) 99/52   Pulse (!) 124   Temp (!) 100.4 F (38 C) (Oral)   Resp (!) 42   Ht 5\' 5"  (1.651 m)   Wt 63.5 kg   SpO2 99%   BMI 23.30 kg/m   Physical Exam  Constitutional: He appears well-developed and well-nourished. He appears distressed.  HENT:  Head: Normocephalic and atraumatic.  Mouth/Throat: Oropharynx is clear and moist. No oropharyngeal exudate.  Mucous membranes are pale  Eyes: Pupils are equal, round, and reactive to light. EOM are normal. Right eye exhibits no discharge. Left eye exhibits no discharge. No scleral icterus.  Conjunctive are pale  Neck: Normal range of motion. Neck supple. No JVD present. No thyromegaly present.  Cardiovascular: Regular rhythm, normal heart sounds and intact distal pulses. Exam reveals no gallop and no friction rub.  No murmur heard. Heart rate of 125, strong pulses at the radial arteries, no JVD  Pulmonary/Chest: He is in respiratory distress. He has no wheezes. He has no rales.  Decreased lung sounds bilaterally, splinting when he breathes, tachypneic to 40 breaths/min.  He has significant decreased sounds on the left side with dullness to percussion halfway up the chest  Abdominal: Soft. Bowel sounds are normal. He exhibits no distension and no mass. There is no tenderness.  No upper abdominal tenderness, soft    Musculoskeletal: Normal range of motion. He exhibits no edema or tenderness.  Lymphadenopathy:    He has no cervical adenopathy.  Neurological: He is alert. Coordination normal.  Skin: Skin is warm and dry. No rash noted. No erythema.  Psychiatric: He has a normal mood and affect. His behavior is normal.  Nursing note and vitals reviewed.    ED Treatments / Results  Labs (all labs ordered are listed, but only abnormal results are displayed) Labs Reviewed  COMPREHENSIVE METABOLIC PANEL - Abnormal; Notable for the following components:      Result Value   CO2 18 (*)    Glucose, Bld 158 (*)    Creatinine, Ser 1.50 (*)    Calcium 8.8 (*)    Albumin 3.2 (*)    AST 57 (*)  GFR calc non Af Amer 46 (*)    GFR calc Af Amer 54 (*)    Anion gap 17 (*)    All other components within normal limits  CBC WITH DIFFERENTIAL/PLATELET - Abnormal; Notable for the following components:   WBC 14.3 (*)    RBC 3.59 (*)    Hemoglobin 10.2 (*)    HCT 32.7 (*)    RDW 15.9 (*)    Neutro Abs 12.1 (*)    All other components within normal limits  I-STAT CG4 LACTIC ACID, ED - Abnormal; Notable for the following components:   Lactic Acid, Venous 3.85 (*)    All other components within normal limits  CULTURE, BLOOD (ROUTINE X 2)  CULTURE, BLOOD (ROUTINE X 2)  PROTIME-INR  URINALYSIS, ROUTINE W REFLEX MICROSCOPIC  I-STAT CG4 LACTIC ACID, ED  TYPE AND SCREEN    EKG EKG Interpretation  Date/Time:  Friday July 05 2018 11:09:57 EDT Ventricular Rate:  121 PR Interval:    QRS Duration: 72 QT Interval:  319 QTC Calculation: 451 R Axis:   42 Text Interpretation:  Sinus tachycardia Since last tracing rate faster Confirmed by Noemi Chapel [phone]) on 07/05/2018 11:41:27 AM   Radiology Dg Chest Port 1 View  Result Date: 07/05/2018 CLINICAL DATA:  Shortness of breath. EXAM: PORTABLE CHEST 1 VIEW COMPARISON:  Chest x-ray dated June 24, 2018. FINDINGS: The left heart border is obscured. Interval  enlargement of the left pleural effusion with likely complete collapse of the left lower lobe and partial collapse of the lingula. The right lung is clear. No pneumothorax. No acute osseous abnormality. IMPRESSION: 1. Worsening moderate to large left pleural effusion with left lower lobe and lingular collapse. Electronically Signed   By: Titus Dubin M.D.   On: 07/05/2018 11:38    Procedures .Critical Care Performed by: Noemi Chapel, MD Authorized by: Noemi Chapel, MD   Critical care provider statement:    Critical care time (minutes):  35   Critical care time was exclusive of:  Separately billable procedures and treating other patients and teaching time   Critical care was necessary to treat or prevent imminent or life-threatening deterioration of the following conditions:  Sepsis   Critical care was time spent personally by me on the following activities:  Blood draw for specimens, development of treatment plan with patient or surrogate, discussions with consultants, evaluation of patient's response to treatment, examination of patient, obtaining history from patient or surrogate, ordering and performing treatments and interventions, ordering and review of laboratory studies, ordering and review of radiographic studies, pulse oximetry, re-evaluation of patient's condition and review of old charts   (including critical care time)  Medications Ordered in ED Medications  cefTRIAXone (ROCEPHIN) 2 g in sodium chloride 0.9 % 100 mL IVPB (0 g Intravenous Stopped 07/05/18 1305)  azithromycin (ZITHROMAX) 500 mg in sodium chloride 0.9 % 250 mL IVPB (500 mg Intravenous New Bag/Given 07/05/18 1306)  lidocaine-EPINEPHrine (XYLOCAINE-EPINEPHrine) 1 %-1:200000 (PF) injection 10 mL (has no administration in time range)  lidocaine-EPINEPHrine (XYLOCAINE-EPINEPHrine) 1 %-1:200000 (PF) injection (has no administration in time range)  sodium chloride 0.9 % bolus 1,000 mL (has no administration in time range)    acetaminophen (TYLENOL) tablet 1,000 mg (1,000 mg Oral Given 07/05/18 1220)     Initial Impression / Assessment and Plan / ED Course  I have reviewed the triage vital signs and the nursing notes.  Pertinent labs & imaging results that were available during my care of the patient were reviewed  by me and considered in my medical decision making (see chart for details).  Clinical Course as of Jul 05 1309  Fri Jul 05, 2018  1142 My exam showed left-sided dullness to percussion in the left hemithorax, his chest x-ray today shows a moderate to large sided pleural effusion on the left, this is larger than it was on 12 August when he was last seen in the emergency department.  I suspect that given his fever and his heart rate that this may be infection and potentially empyema.  He will need to have some of this fluid removed.  He will need to have antibiotics and will need to be treated as an inpatient for what appears to be sepsis with fever tachycardia and the source of the infection being the lungs or an empyema.   [BM]    Clinical Course User Index [BM] Noemi Chapel, MD   The patient is a very abnormal exam, he has some decreased lung sounds specifically on the left compared to the right but his splinting makes it very difficult to hear either side.  This could be related to a spontaneous pneumothorax, pleural effusion, infection, sepsis, he does appear to be severely anemic as well based on the color of his mucous membranes  White blood cell count elevated at over 12,197, metabolic panel shows some renal dysfunction, antibiotics ordered, lactic acid just under 4.  No hypotension  Vitals:   07/05/18 1133 07/05/18 1219 07/05/18 1230 07/05/18 1300  BP:  120/67 108/70 (!) 99/52  Pulse:  (!) 124 (!) 125 (!) 124  Resp:  (!) 41 (!) 36 (!) 42  Temp:      TempSrc:      SpO2:  100% 99% 99%  Weight: 63.5 kg     Height: 5\' 5"  (1.651 m)       Final Clinical Impressions(s) / ED Diagnoses   Final  diagnoses:  Pleural effusion  Sepsis, due to unspecified organism ([place])  Tachycardia  Empyema of lung ([facility])      Noemi Chapel, MD 07/05/18 1310

## 2018-07-05 NOTE — Progress Notes (Signed)
Pharmacy Antibiotic Note  Clifford [name] is a 67 y.o. male admitted on 07/05/2018 with aspiration PNA.  Pharmacy has been consulted for [facility] dosing.  Plan: Unasyn 3gm IV q8h Also, on zithromax 500mg  IV q24h F/U cxs and clinical progress Monitor V/S, labs  Height: 5\' 5"  (165.1 cm) Weight: 140 lb (63.5 kg) IBW/kg (Calculated) : 61.5  Temp (24hrs), Avg:100.6 F (38.1 C), Min:100.4 F (38 C), Max:100.7 F (38.2 C)  Recent Labs  Lab 07/05/18 1156 07/05/18 1248  WBC 14.3*  --   CREATININE 1.50*  --   LATICACIDVEN  --  3.85*    Estimated Creatinine Clearance: 41.6 mL/min (A) (by C-G formula based on SCr of 1.5 mg/dL (H)).    Allergies  Allergen Reactions  . Lisinopril Swelling    Antimicrobials this admission: Unasyn 8/23 >> Azithromycin 8/23 >>  Rocephin x 1 dose in ED 8/23  Dose adjustments this admission: N/A  Microbiology results: 8/23 BCx: pendig  UCx:   Sputum: To be collected  MRSA PCR:   Thank you for allowing pharmacy to be a part of this patient's care.  Isac Sarna, BS [place], [place] Clinical Pharmacist Pager [phone] 07/05/2018 7:09 PM

## 2018-07-05 NOTE — Procedures (Signed)
PreOperative Dx: LEFT pleural effusion Postoperative Dx: LEFT pleural effusion Procedure:   US guided LEFT thoracentesis Radiologist:  Thornton Papas Anesthesia:  10 ml of 1% lidocaine Specimen:  500 mL of cloudy tan colored fluid EBL:   < 1 ml Complications: None

## 2018-07-05 NOTE — Progress Notes (Signed)
Thoracentesis complete no signs of distress, 545ml pleural fluid removed.

## 2018-07-05 NOTE — ED Notes (Addendum)
Patient transported for thorancentesis

## 2018-07-05 NOTE — ED Triage Notes (Signed)
Patient complaining of shortness of breath and abdominal pain starting this morning.

## 2018-07-05 NOTE — H&P (Signed)
History and Physical    Clifford [name] FBP:[id number] DOB: 11-07-1951 DOA: 07/05/2018  Referring MD/NP/PA: Noemi Chapel, EDP PCP: [name], FNP  Patient coming from: Home  Chief Complaint: Left sided chest pain  HPI: Clifford [name] is a 67 y.o. male with history of alcoholism, prostate cancer, hypertension and internal hemorrhoids who comes to the hospital today with left-sided chest pain.  He states that he has noticed slight shortness of breath over the past 2 to 3 days but this morning around 9:00 had a sharp pain to his mid axillary left region with deep inspiration.  He has not had fever or chills, denies recent travel or sick contacts.  Upon arrival to the emergency department he is noted to be tachycardic into the 824M, systolic blood pressure in the 90s, good oxygenation and tachypneic with respiratory rates as high as the 40s, temperature of 100.7.  Labs are significant for creatinine of 1.50 which is above his baseline of 1.27, WBC count of 14.3, lactic acid was 3.85.  Chest x-ray showed a worsening large left pleural effusion with left lower lobe and lingular collapse.  He had a thoracentesis with removal of 500 cc with fluid studies pending.  Admission was requested for further evaluation and management.  Past Medical/Surgical History: Past Medical History:  Diagnosis Date  . Alcoholism ([place])   . Anemia   . High cholesterol   . Hypertension   . Prostate cancer ([place])   . Seizures ([place])     Past Surgical History:  Procedure Laterality Date  . CIRCUMCISION    . COLONOSCOPY N/A 09/13/2016   Procedure: COLONOSCOPY;  Surgeon: Rogene Houston, MD;  [facility];  Service: Endoscopy;  Laterality: N/A;  2:15  . COLONOSCOPY N/A 04/06/2018   Procedure: COLONOSCOPY;  Surgeon: Rogene Houston, MD;  [facility];  Service: Endoscopy;  Laterality: N/A;  . FLEXIBLE SIGMOIDOSCOPY N/A 01/10/2018   Procedure: FLEXIBLE SIGMOIDOSCOPY;  Surgeon: Rogene Houston, MD;  [facility];  Service: Endoscopy;  Laterality: N/A;  1:55  . FLEXIBLE SIGMOIDOSCOPY N/A 06/08/2018   Procedure: FLEXIBLE SIGMOIDOSCOPY;  Surgeon: Danie Binder, MD;  [facility];  Service: Endoscopy;  Laterality: N/A;  . HEMORRHOID SURGERY N/A 04/09/2018   Procedure: HEMORRHOIDECTOMY;  Surgeon: Aviva Signs, MD;  Location: [facility];  Service: General;  Laterality: N/A;  . NO PAST SURGERIES    . POLYPECTOMY  04/06/2018   Procedure: POLYPECTOMY;  Surgeon: Rogene Houston, MD;  [facility];  Service: Endoscopy;;  sigmoid  . PROSTATE BIOPSY      Social History:  reports that he has never smoked. He has never used smokeless tobacco. He reports that he drinks alcohol. He reports that he does not use drugs.  Allergies: Allergies  Allergen Reactions  . Lisinopril Swelling    Family History:  Family History  Problem Relation Age of Onset  . Alzheimer's disease Mother   . Hypertension Mother   . Hypertension Father   . Stroke Father   . Prostate cancer Father   . Hypertension Brother     Prior to Admission medications   Medication Sig Start Date End Date Taking? Authorizing Provider  amLODipine (NORVASC) 10 MG tablet Take 10 mg by mouth daily.   Yes [provider]  B-COMPLEX-C PO Take 1 tablet by mouth every morning.   Yes [provider]  docusate sodium (STOOL SOFTENER) 100 MG capsule Take 100 mg by mouth  every morning.    Yes [provider]  Multiple Vitamin (MULTIVITAMIN WITH MINERALS) TABS tablet Take 1 tablet by mouth daily.   Yes [provider]  pantoprazole (PROTONIX) 40 MG tablet Take 40 mg by mouth daily.   Yes [provider]  folic acid (FOLVITE) 1 MG tablet Take 1 tablet (1 mg total) by mouth daily. Patient not taking: Reported on 06/24/2018 06/10/18   Clifford [name], Clifford Halsted, MD  thiamine 100 MG tablet Take 1 tablet (100 mg total) by mouth daily. Patient not taking: Reported on  06/24/2018 06/10/18   Clifford [name], Clifford Halsted, MD    Review of Systems:  Constitutional: Denies fever, chills, diaphoresis, appetite change and fatigue.  HEENT: Denies photophobia, eye pain, redness, hearing loss, ear pain, congestion, sore throat, rhinorrhea, sneezing, mouth sores, trouble swallowing, neck pain, neck stiffness and tinnitus.   Respiratory: Denies SOB, DOE, cough, chest tightness,  and wheezing.   Cardiovascular: Denies chest pain, palpitations and leg swelling.  Gastrointestinal: Denies nausea, vomiting, abdominal pain, diarrhea, constipation, blood in stool and abdominal distention.  Genitourinary: Denies dysuria, urgency, frequency, hematuria, flank pain and difficulty urinating.  Endocrine: Denies: hot or cold intolerance, sweats, changes in hair or nails, polyuria, polydipsia. Musculoskeletal: Denies myalgias, back pain, joint swelling, arthralgias and gait problem.  Skin: Denies pallor, rash and wound.  Neurological: Denies dizziness, seizures, syncope, weakness, light-headedness, numbness and headaches.  Hematological: Denies adenopathy. Easy bruising, personal or family bleeding history  Psychiatric/Behavioral: Denies suicidal ideation, mood changes, confusion, nervousness, sleep disturbance and agitation    Physical Exam: Vitals:   07/05/18 1315 07/05/18 1330 07/05/18 1348 07/05/18 1448  BP: (!) 88/52 (!) 92/53  122/63  Pulse:  (!) 121  (!) 115  Resp:  (!) 21  (!) 48  Temp:   (!) 100.7 F (38.2 C) (!) 100.6 F (38.1 C)  TempSrc:   Oral Oral  SpO2: 99% 99%  95%  Weight:      Height:         Constitutional: NAD, calm, mild distress secondary to left-sided chest pain Eyes: PERRL, lids and conjunctivae normal ENMT: Mucous membranes are dry. Posterior pharynx clear of any exudate or lesions.poor dentition.  Neck: normal, supple, no masses, no thyromegaly Respiratory: Increased respiratory effort, left greater than right base with bilateral basilar  crackles. Cardiovascular: Tachycardic but regular rhythm, no murmurs / rubs / gallops. No extremity edema. 2+ pedal pulses. No carotid bruits.  Abdomen: no tenderness, no masses palpated. No hepatosplenomegaly. Bowel sounds positive.  Musculoskeletal: no clubbing / cyanosis. No joint deformity upper and lower extremities. Good ROM, no contractures. Normal muscle tone.  Skin: no rashes, lesions, ulcers. No induration Neurologic: CN 2-12 grossly intact. Sensation intact, DTR normal. Strength 5/5 in all 4.  Psychiatric: Normal judgment and insight. Alert and oriented x 3. Normal mood.    Labs on Admission: I have personally reviewed the following labs and imaging studies  CBC: Recent Labs  Lab 07/05/18 1156  WBC 14.3*  NEUTROABS 12.1*  HGB 10.2*  HCT 32.7*  MCV 91.1  PLT 902   Basic Metabolic Panel: Recent Labs  Lab 07/05/18 1156  NA 136  K 4.3  CL 101  CO2 18*  GLUCOSE 158*  BUN 15  CREATININE 1.50*  CALCIUM 8.8*   GFR: Estimated Creatinine Clearance: 41.6 mL/min (A) (by C-G formula based on SCr of 1.5 mg/dL (H)). Liver Function Tests: Recent Labs  Lab 07/05/18 1156  AST 57*  ALT 19  ALKPHOS 87  BILITOT 1.2  PROT 7.4  ALBUMIN 3.2*   No results for input(s): LIPASE, AMYLASE in the last 168 hours. No results for input(s): AMMONIA in the last 168 hours. Coagulation Profile: Recent Labs  Lab 07/05/18 1156  INR 1.10   Cardiac Enzymes: No results for input(s): CKTOTAL, CKMB, CKMBINDEX, TROPONINI in the last 168 hours. BNP (last 3 results) No results for input(s): PROBNP in the last 8760 hours. HbA1C: No results for input(s): HGBA1C in the last 72 hours. CBG: No results for input(s): GLUCAP in the last 168 hours. Lipid Profile: No results for input(s): CHOL, HDL, LDLCALC, TRIG, CHOLHDL, LDLDIRECT in the last 72 hours. Thyroid Function Tests: No results for input(s): TSH, T4TOTAL, FREET4, T3FREE, THYROIDAB in the last 72 hours. Anemia Panel: No results for  input(s): VITAMINB12, FOLATE, FERRITIN, TIBC, IRON, RETICCTPCT in the last 72 hours. Urine analysis:    Component Value Date/Time   COLORURINE YELLOW 04/07/2018 [place] 04/07/2018 1015   LABSPEC 1.008 04/07/2018 1015   PHURINE 7.0 04/07/2018 1015   GLUCOSEU NEGATIVE 04/07/2018 1015   HGBUR SMALL (A) 04/07/2018 1015   BILIRUBINUR NEGATIVE 04/07/2018 1015   KETONESUR 5 (A) 04/07/2018 1015   PROTEINUR 30 (A) 04/07/2018 1015   NITRITE NEGATIVE 04/07/2018 1015   LEUKOCYTESUR NEGATIVE 04/07/2018 1015   Sepsis Labs: @LABRCNTIP (procalcitonin:4,lacticidven:4) ) Recent Results (from the past 240 hour(s))  Culture, blood (Routine x 2)     Status: None (Preliminary result)   Collection Time: 07/05/18 11:56 AM  Result Value Ref Range Status   Specimen Description BLOOD LEFT ARM  Final   Special Requests   Final    BOTTLES DRAWN AEROBIC AND ANAEROBIC Blood Culture adequate volume Performed at [facility], [address] [place], [place] [address]    Culture PENDING  Incomplete   Report Status PENDING  Incomplete  Culture, blood (Routine x 2)     Status: None (Preliminary result)   Collection Time: 07/05/18 12:02 PM  Result Value Ref Range Status   Specimen Description BLOOD RIGHT HAND  Final   Special Requests   Final    BOTTLES DRAWN AEROBIC AND ANAEROBIC Blood Culture results may not be optimal due to an inadequate volume of blood received in culture bottles Performed at [facility], [address], [place], [place] [address]    Culture PENDING  Incomplete   Report Status PENDING  Incomplete  Gram stain     Status: None   Collection Time: 07/05/18  1:35 PM  Result Value Ref Range Status   Specimen Description PLEURAL  Final   Special Requests NONE  Final   Gram Stain   Final    NO ORGANISMS SEEN WBC PRESENT, PREDOMINANTLY PMN CYTOSPIN SMEAR Performed at [facility], [address], [place], [place] [address]    Report Status 07/05/2018 FINAL  Final  Culture,  body fluid-bottle     Status: None (Preliminary result)   Collection Time: 07/05/18  1:35 PM  Result Value Ref Range Status   Specimen Description PLEURAL  Final   Special Requests   Final    BOTTLES DRAWN AEROBIC AND ANAEROBIC 10CC Performed at [facility], [address] [place], [place] [address]    Culture PENDING  Incomplete   Report Status PENDING  Incomplete     Radiological Exams on Admission: Dg Chest 1 View  Result Date: 07/05/2018 CLINICAL DATA:  Post LEFT thoracentesis EXAM: CHEST  1 VIEW COMPARISON:  Earlier study 07/05/2018 FINDINGS: No pneumothorax following LEFT thoracentesis.  Persistent LEFT pleural effusion and basilar atelectasis accentuated by expiratory technique. RIGHT lung clear. Bones demineralized. IMPRESSION: No pneumothorax following LEFT thoracentesis. Electronically Signed   By: Lavonia Dana M.D.   On: 07/05/2018 14:25   Dg Chest Port 1 View  Result Date: 07/05/2018 CLINICAL DATA:  Shortness of breath. EXAM: PORTABLE CHEST 1 VIEW COMPARISON:  Chest x-ray dated June 24, 2018. FINDINGS: The left heart border is obscured. Interval enlargement of the left pleural effusion with likely complete collapse of the left lower lobe and partial collapse of the lingula. The right lung is clear. No pneumothorax. No acute osseous abnormality. IMPRESSION: 1. Worsening moderate to large left pleural effusion with left lower lobe and lingular collapse. Electronically Signed   By: Titus Dubin M.D.   On: 07/05/2018 11:38   US Thoracentesis Asp Pleural Space W/img Guide  Result Date: 07/05/2018 INDICATION: LEFT pleural effusion question empyema EXAM: ULTRASOUND GUIDED DIAGNOSTIC AND THERAPEUTIC LEFT THORACENTESIS MEDICATIONS: None. COMPLICATIONS: None immediate. PROCEDURE: Procedure, benefits, and risks of procedure were discussed with patient. Written informed consent for procedure was obtained. Time out protocol followed. Pleural effusion localized by ultrasound at the posterior  LEFT hemithorax. Skin prepped and draped in usual sterile fashion. Skin and soft tissues anesthetized with 10 mL of 1% lidocaine. 8 French thoracentesis catheter placed into the LEFT pleural space. 500 mL of cloudy tan-colored fluid aspirated from the LEFT pleural space by syringe and vacuum bottle. Procedure terminated prematurely due to patient discomfort and wanting to lie down. Procedure tolerated well by patient without immediate complication. FINDINGS: A total of approximately 500 mL of LEFT pleural fluid was removed. Samples were sent to the laboratory as requested by the clinical team. IMPRESSION: Successful ultrasound guided LEFT thoracentesis yielding 500 mL of pleural fluid. Electronically Signed   By: Lavonia Dana M.D.   On: 07/05/2018 13:56    EKG: Independently reviewed.  Sinus tachycardia at a rate of 120, no acute ischemic changes  Assessment/Plan Principal Problem:   Pleural effusion, left Active Problems:   Aspiration pneumonia of both lower lobes due to gastric secretions (HCC)   Alcoholism (HCC)   Seizures (HCC)   Hyperlipidemia   Essential hypertension   Malignant neoplasm of prostate (HCC)   Sepsis ([place])    Severe sepsis due to presumed aspiration pneumonia -Patient has lactic acidosis, leukocytosis, tachypnea, mild hypotension and fever with a source of pneumonia. -Given his history of alcoholism and left basilar pneumonia, suspect this is likely an aspiration pneumonia.  Will start Unasyn.  We will also add azithromycin to cover possibility of atypical pathogens. -Due to his large pleural effusion and concern for exudative effusion, have requested EDP perform thoracentesis which has been done prior to admission with removal of 500 cc, fluid studies are pending.  Patient has less shortness of breath since procedure was done. -We will also start IV fluids. -Check blood/sputum cultures. -Strep pneumo/Legionella urine antigen, check influenza PCR.  History of alcohol  abuse -Thiamine/folate/multivitamin. -We will monitor for withdrawals on CIWA protocol with IV diazepam.  Acute renal failure -Likely due to prerenal azotemia and ATN due to sepsis phenomena and decreased tissue perfusion. -Give IV fluids and follow renal function.  Essential hypertension -Given relative hypotension and sepsis we will discontinue Norvasc   DVT prophylaxis: Subcutaneous heparin Code Status: Full code Family Communication: Multiple family members at bedside updated on plan of care and all questions answered Disposition Plan: Pending medical stability discharge home Consults called: None Admission status: Admit - It is  my clinical opinion that admission to INPATIENT is reasonable and necessary because of the expectation that this patient will require hospital care that crosses at least 2 midnights to treat this condition based on the medical complexity of the problems presented.  Given the aforementioned information, the predictability of an adverse outcome is felt to be significant.      Time Spent: 90 minutes  Jyden Kromer Clifford Bliss MD [facility] Pager [phone]  If 7PM-7AM, please contact night-coverage [email or web address] Password [facility]  07/05/2018, 6:45 PM

## 2018-07-06 ENCOUNTER — Inpatient Hospital Stay (HOSPITAL_COMMUNITY): Payer: Medicare Other

## 2018-07-06 ENCOUNTER — Encounter (HOSPITAL_COMMUNITY): Payer: Self-pay

## 2018-07-06 DIAGNOSIS — R000 Tachycardia, unspecified: Secondary | ICD-10-CM

## 2018-07-06 DIAGNOSIS — J869 Pyothorax without fistula: Secondary | ICD-10-CM

## 2018-07-06 DIAGNOSIS — F1020 Alcohol dependence, uncomplicated: Secondary | ICD-10-CM

## 2018-07-06 DIAGNOSIS — A419 Sepsis, unspecified organism: Secondary | ICD-10-CM

## 2018-07-06 LAB — COMPREHENSIVE METABOLIC PANEL
ALT: 17 U/L (ref 0–44)
ALT: 14 U/L (ref 0–44)
ANION GAP: 10 (ref 5–15)
AST: 35 U/L (ref 15–41)
AST: 23 U/L (ref 15–41)
Albumin: 2.3 g/dL — ABNORMAL LOW (ref 3.5–5.0)
Albumin: 1.9 g/dL — ABNORMAL LOW (ref 3.5–5.0)
Alkaline Phosphatase: 48 U/L (ref 38–126)
Alkaline Phosphatase: 51 U/L (ref 38–126)
Anion gap: 9 (ref 5–15)
BILIRUBIN TOTAL: 0.6 mg/dL (ref 0.3–1.2)
BUN: 25 mg/dL — ABNORMAL HIGH (ref 8–23)
BUN: 23 mg/dL (ref 8–23)
CHLORIDE: 107 mmol/L (ref 98–111)
CO2: 21 mmol/L — ABNORMAL LOW (ref 22–32)
CO2: 19 mmol/L — AB (ref 22–32)
CREATININE: 1.76 mg/dL — AB (ref 0.61–1.24)
Calcium: 7.3 mg/dL — ABNORMAL LOW (ref 8.9–10.3)
Calcium: 6.9 mg/dL — ABNORMAL LOW (ref 8.9–10.3)
Chloride: 105 mmol/L (ref 98–111)
Creatinine, Ser: 1.71 mg/dL — ABNORMAL HIGH (ref 0.61–1.24)
GFR calc non Af Amer: 40 mL/min — ABNORMAL LOW (ref >60)
GFR, EST AFRICAN AMERICAN: 44 mL/min — AB (ref >60)
GFR, EST AFRICAN AMERICAN: 46 mL/min — AB (ref >60)
GFR, EST NON AFRICAN AMERICAN: 38 mL/min — AB (ref >60)
Glucose, Bld: 145 mg/dL — ABNORMAL HIGH (ref 70–99)
Glucose, Bld: 128 mg/dL — ABNORMAL HIGH (ref 70–99)
POTASSIUM: 5 mmol/L (ref 3.5–5.1)
Potassium: 4.4 mmol/L (ref 3.5–5.1)
SODIUM: 136 mmol/L (ref 135–145)
Sodium: 135 mmol/L (ref 135–145)
TOTAL PROTEIN: 5.6 g/dL — AB (ref 6.5–8.1)
Total Bilirubin: 0.9 mg/dL (ref 0.3–1.2)
Total Protein: 4.9 g/dL — ABNORMAL LOW (ref 6.5–8.1)

## 2018-07-06 LAB — BLOOD GAS, ARTERIAL
Acid-base deficit: 5.6 mmol/L — ABNORMAL HIGH (ref 0.0–2.0)
BICARBONATE: 17.9 mmol/L — AB (ref 20.0–28.0)
DRAWN BY: 345601
O2 Content: 4 L/min
O2 Saturation: 92.7 %
PATIENT TEMPERATURE: 100.1
pCO2 arterial: 28.7 mmHg — ABNORMAL LOW (ref 32.0–48.0)
pH, Arterial: 7.416 (ref 7.350–7.450)
pO2, Arterial: 67.5 mmHg — ABNORMAL LOW (ref 83.0–108.0)

## 2018-07-06 LAB — LACTIC ACID, PLASMA: Lactic Acid, Venous: 2.2 mmol/L (ref 0.5–1.9)

## 2018-07-06 LAB — URINALYSIS, ROUTINE W REFLEX MICROSCOPIC
BILIRUBIN URINE: NEGATIVE
GLUCOSE, UA: NEGATIVE mg/dL
Hgb urine dipstick: NEGATIVE
Ketones, ur: NEGATIVE mg/dL
Leukocytes, UA: NEGATIVE
Nitrite: NEGATIVE
Protein, ur: NEGATIVE mg/dL
SPECIFIC GRAVITY, URINE: 1.021 (ref 1.005–1.030)
pH: 5 (ref 5.0–8.0)

## 2018-07-06 LAB — CBC
HCT: 27 % — ABNORMAL LOW (ref 39.0–52.0)
HCT: 25.3 % — ABNORMAL LOW (ref 39.0–52.0)
Hemoglobin: 8.4 g/dL — ABNORMAL LOW (ref 13.0–17.0)
Hemoglobin: 7.9 g/dL — ABNORMAL LOW (ref 13.0–17.0)
MCH: 27.9 pg (ref 26.0–34.0)
MCH: 28.2 pg (ref 26.0–34.0)
MCHC: 31.1 g/dL (ref 30.0–36.0)
MCHC: 31.2 g/dL (ref 30.0–36.0)
MCV: 89.7 fL (ref 78.0–100.0)
MCV: 90.4 fL (ref 78.0–100.0)
PLATELETS: 221 10*3/uL (ref 150–400)
PLATELETS: 168 10*3/uL (ref 150–400)
RBC: 3.01 MIL/uL — AB (ref 4.22–5.81)
RBC: 2.8 MIL/uL — ABNORMAL LOW (ref 4.22–5.81)
RDW: 16.1 % — AB (ref 11.5–15.5)
RDW: 15.6 % — ABNORMAL HIGH (ref 11.5–15.5)
WBC: 16 10*3/uL — AB (ref 4.0–10.5)
WBC: 14.4 10*3/uL — ABNORMAL HIGH (ref 4.0–10.5)

## 2018-07-06 LAB — PROTIME-INR
INR: 1.42
PROTHROMBIN TIME: 17.2 s — AB (ref 11.4–15.2)

## 2018-07-06 LAB — STREP PNEUMONIAE URINARY ANTIGEN: STREP PNEUMO URINARY ANTIGEN: NEGATIVE

## 2018-07-06 LAB — LIPASE, BLOOD: LIPASE: 44 U/L (ref 11–51)

## 2018-07-06 LAB — APTT: aPTT: 45 seconds — ABNORMAL HIGH (ref 24–36)

## 2018-07-06 MED ORDER — CHLORHEXIDINE GLUCONATE CLOTH 2 % EX PADS
6.0000 | MEDICATED_PAD | Freq: Once | CUTANEOUS | Status: AC
  Administered 2018-07-06: 6 via TOPICAL

## 2018-07-06 MED ORDER — PIPERACILLIN-TAZOBACTAM 3.375 G IVPB
3.3750 g | Freq: Three times a day (TID) | INTRAVENOUS | Status: DC
  Administered 2018-07-06 – 2018-07-09 (×7): 3.375 g via INTRAVENOUS
  Filled 2018-07-06 (×9): qty 50

## 2018-07-06 MED ORDER — LACTATED RINGERS IV SOLN
INTRAVENOUS | Status: DC
  Administered 2018-07-07: 06:00:00 via INTRAVENOUS

## 2018-07-06 MED ORDER — CHLORHEXIDINE GLUCONATE CLOTH 2 % EX PADS
6.0000 | MEDICATED_PAD | Freq: Once | CUTANEOUS | Status: AC
  Administered 2018-07-07: 6 via TOPICAL

## 2018-07-06 MED ORDER — MORPHINE SULFATE (PF) 2 MG/ML IV SOLN
1.0000 mg | INTRAVENOUS | Status: DC | PRN
  Administered 2018-07-06: 1 mg via INTRAVENOUS
  Filled 2018-07-06 (×2): qty 1

## 2018-07-06 MED ORDER — VANCOMYCIN HCL IN DEXTROSE 1-5 GM/200ML-% IV SOLN
1000.0000 mg | INTRAVENOUS | Status: AC
  Administered 2018-07-07: 1000 mg via INTRAVENOUS
  Filled 2018-07-06: qty 200

## 2018-07-06 MED ORDER — SODIUM CHLORIDE 0.9 % IV BOLUS
1000.0000 mL | Freq: Once | INTRAVENOUS | Status: AC
  Administered 2018-07-06: 1000 mL via INTRAVENOUS

## 2018-07-06 NOTE — Progress Notes (Signed)
Late entry:   2mg  morphine & 1mg  Ativan wasted in sharps & witnessed by B. Foley, Therapist, sports.

## 2018-07-06 NOTE — Progress Notes (Addendum)
PROGRESS NOTE    Clifford [name]  RCV:[id number] DOB: Apr 28, 1951 DOA: 07/05/2018 PCP: [name], FNP    Brief Narrative:  67 year old male who presented with left-sided chest pain.  He does have significant past medical history for alcoholism, prostate cancer, and hypertension.  Reported worsening dyspnea for the last 3 days, associated with sharp mid axillary chest pain pain, worse with inspiration.  On the initial physical examination his heart rate was 120, blood pressure 90 systolic, respiratory rate 40s and temperature 100.7.  Dry mucous membranes, lungs with bibasilar rales, abdomen soft nontender, no lower extremity edema.  Sodium 136, potassium 4.3, chloride 101, bicarb 18, glucose 158, BUN 15, creatinine 1.50, anion gap 17, white count 14.3, hemoglobin 10.2, hematocrit 32.7, platelets 370.  Chest x-ray with large left pleural effusion.  EKG with normal sinus rhythm, LVH.  Patient was admitted to the hospital with a working diagnosis of sepsis due to left lower lobe pneumonia complicated with left parapneumonic pleural effusion.  Assessment & Plan:   Principal Problem:   Pleural effusion, left Active Problems:   Alcoholism (HCC)   Seizures (HCC)   Hyperlipidemia   Essential hypertension   Malignant neoplasm of prostate ([place])   Aspiration pneumonia of both lower lobes due to gastric secretions ([place])   Sepsis ([place])   1. Sepsis due to left lower lobe community acquired pneumonia, complicated with left empyema and acute hypoxic respiratory failure. Will continue IV fluids with isotonic saline at 100 ml per hour, antibiotic therapy with IV Unasyn. Worsening wbc up to 16,0, continue to have fever T max 102.5, persistent tachycardia, blood pressure 102 mmHg systolic. Pleural fluid analysis with 100,022 wbc, cloudy fluid, consistent with empyema. Case discussed with Dr. Arnoldo Morale (surgery), will transfer patient to [facility] for thoracic surgery evaluation, patient will need chest  tube and possible VATS. Will check non contrast CT chest before transfer, continue oxymetry monitoring and supplemental 02 per [place]. Will add morphine for pain control.   2. AKI due to sepsis. No signs of volume overload, worsening renal function with serum cr up to 1,76 with K at 5,0 and serum bicarbonate at 21. Na at 135 with Cl 105. Continue isotonic saline at 100 ml per hour, will follow on renal panel in am, avoid hypotension and nephrotoxic medications.   3. Alcohol abuse. Will continue neuro checks per unit protocol, will continue as needed benzodiazepines per CIWA protocol.   4. HTN. Will hold on blood pressure agents due to risk of hypotension.   DVT prophylaxis: heparin   Code Status:  full Family Communication: I spoke with patient's wife  at the bedside and all questions were addressed.  Disposition Plan/ discharge barriers: Patient will be transferred to [facility] for CT surgery evaluation. Case discussed with Dr. Roxan Hockey and Dr. Cruzita Lederer.    Consultants:   CT surgery   Procedures:     Antimicrobials:   Unasyn IV    Subjective: Patient continue to have dyspnea, moderate to severe, had increased oxygen requirements, positive left sided chest pain, moderate to sever in intensity, sharp in nature, no radiation, worse with deep inspiration.   Objective: Vitals:   07/06/18 0620 07/06/18 1050 07/06/18 1114 07/06/18 1124  BP: 114/61 129/71 125/78   Pulse: 99 (!) 114 (!) 110 (!) 110  Resp: 16 (!) 36 (!) 30 20  Temp: 99.5 F (37.5 C)     TempSrc: Oral     SpO2: 94% 96% 97% 95%  Weight:  Height:        Intake/Output Summary (Last 24 hours) at 07/06/2018 1137 Last data filed at 07/06/2018 0603 Gross per 24 hour  Intake 3081.41 ml  Output -  Net 3081.41 ml   Filed Weights   07/05/18 1104 07/05/18 1133 07/05/18 1502  Weight: 63.5 kg 63.5 kg 65.4 kg    Examination:   General: deconditioned and ill looking appearing  Neurology: Awake and alert, non focal    E ENT: positive pallor, no icterus, oral mucosa moist Cardiovascular: No JVD. S1-S2 present, rhythmic, no gallops, rubs, or murmurs. No lower extremity edema. Pulmonary: decreased breath sounds at the left base, poor air movement, no wheezing or rhonchi, scattered rales. Gastrointestinal. Abdomen with no organomegaly, non tender, no rebound or guarding Skin. No rashes Musculoskeletal: no joint deformities     Data Reviewed: I have personally reviewed following labs and imaging studies  CBC: Recent Labs  Lab 07/05/18 1156 07/06/18 0510  WBC 14.3* 16.0*  NEUTROABS 12.1*  --   HGB 10.2* 8.4*  HCT 32.7* 27.0*  MCV 91.1 89.7  PLT 370 694   Basic Metabolic Panel: Recent Labs  Lab 07/05/18 1156 07/06/18 0510  NA 136 135  K 4.3 5.0  CL 101 105  CO2 18* 21*  GLUCOSE 158* 145*  BUN 15 25*  CREATININE 1.50* 1.76*  CALCIUM 8.8* 7.3*   GFR: Estimated Creatinine Clearance: 35.4 mL/min (A) (by C-G formula based on SCr of 1.76 mg/dL (H)). Liver Function Tests: Recent Labs  Lab 07/05/18 1156 07/06/18 0510  AST 57* 35  ALT 19 17  ALKPHOS 87 48  BILITOT 1.2 0.6  PROT 7.4 5.6*  ALBUMIN 3.2* 2.3*   No results for input(s): LIPASE, AMYLASE in the last 168 hours. No results for input(s): AMMONIA in the last 168 hours. Coagulation Profile: Recent Labs  Lab 07/05/18 1156  INR 1.10   Cardiac Enzymes: No results for input(s): CKTOTAL, CKMB, CKMBINDEX, TROPONINI in the last 168 hours. BNP (last 3 results) No results for input(s): PROBNP in the last 8760 hours. HbA1C: No results for input(s): HGBA1C in the last 72 hours. CBG: No results for input(s): GLUCAP in the last 168 hours. Lipid Profile: No results for input(s): CHOL, HDL, LDLCALC, TRIG, CHOLHDL, LDLDIRECT in the last 72 hours. Thyroid Function Tests: No results for input(s): TSH, T4TOTAL, FREET4, T3FREE, THYROIDAB in the last 72 hours. Anemia Panel: No results for input(s): VITAMINB12, FOLATE, FERRITIN, TIBC,  IRON, RETICCTPCT in the last 72 hours.    Radiology Studies: I have reviewed all of the imaging during this hospital visit personally     Scheduled Meds: . B-complex with vitamin C  1 tablet Oral q morning - 10a  . docusate sodium  100 mg Oral q morning - 85I  . folic acid  1 mg Oral Daily  . heparin  5,000 Units Subcutaneous Q8H  . LORazepam  0-4 mg Intravenous Q6H   Followed by  . [START ON 07/08/2018] LORazepam  0-4 mg Intravenous Q12H  . multivitamin with minerals  1 tablet Oral Daily  . pantoprazole  40 mg Oral Daily  . thiamine  100 mg Oral Daily   Or  . thiamine  100 mg Intravenous Daily   Continuous Infusions: . sodium chloride 125 mL/hr at 07/06/18 0351  . ampicillin-sulbactam (UNASYN) IV Stopped (07/06/18 6270)  . azithromycin       LOS: 1 day        Tawni Millers, MD [facility] Pager [phone]

## 2018-07-06 NOTE — H&P (View-Only) (Signed)
Reason for Consult: Left empyema Referring Physician: [facility]  Clifford [name] is an 67 y.o. male.  HPI: 67 yo man with a history of ethanol abuse, seizures, hyperlipidemia, aspiration pneumonia, prostate cancer and recent GI bleed who presented to [facility] yesterday with left sided Cp and shortness of breath. Was tachycardic and febrile. CXR showed a left effusion. Thoracentesis yesterday c/w empyema.Started on empiric antibiotics with Zosyn.  Transferred to [facility] for further management.   On arrival fever to 102.7 and tachycardic to 125.  Currently denies pain and shortness of breath at rest.  Past Medical History:  Diagnosis Date  . Alcoholism (HCC)   . Anemia   . High cholesterol   . Hypertension   . Prostate cancer (HCC)   . Seizures (HCC)     Past Surgical History:  Procedure Laterality Date  . CIRCUMCISION    . COLONOSCOPY N/A 09/13/2016   Procedure: COLONOSCOPY;  Surgeon: Najeeb U Rehman, MD;  [facility];  Service: Endoscopy;  Laterality: N/A;  2:15  . COLONOSCOPY N/A 04/06/2018   Procedure: COLONOSCOPY;  Surgeon: Rehman, Najeeb U, MD;  Location[facility];  Service: Endoscopy;  Laterality: N/A;  . FLEXIBLE SIGMOIDOSCOPY N/A 01/10/2018   Procedure: FLEXIBLE SIGMOIDOSCOPY;  Surgeon: Rehman, Najeeb U, MD;  [facility];  Service: Endoscopy;  Laterality: N/A;  1:55  . FLEXIBLE SIGMOIDOSCOPY N/A 06/08/2018   Procedure: FLEXIBLE SIGMOIDOSCOPY;  Surgeon: Fields, Sandi L, MD;  [facility];  Service: Endoscopy;  Laterality: N/A;  . HEMORRHOID SURGERY N/A 04/09/2018   Procedure: HEMORRHOIDECTOMY;  Surgeon: Jenkins, Mark, MD;  Location: [facility];  Service: General;  Laterality: N/A;  . NO PAST SURGERIES    . POLYPECTOMY  04/06/2018   Procedure: POLYPECTOMY;  Surgeon: Rehman, Najeeb U, MD;  [facility];  Service: Endoscopy;;  sigmoid  . PROSTATE BIOPSY      Family History  Problem Relation Age of Onset  . Alzheimer's  disease Mother   . Hypertension Mother   . Hypertension Father   . Stroke Father   . Prostate cancer Father   . Hypertension Brother     Social History:  reports that he has never smoked. He has never used smokeless tobacco. He reports that he drinks alcohol. He reports that he does not use drugs.  Allergies:  Allergies  Allergen Reactions  . Lisinopril Swelling    Medications:  Scheduled: . B-complex with vitamin C  1 tablet Oral q morning - 10a  . folic acid  1 mg Oral Daily  . heparin  5,000 Units Subcutaneous Q8H  . LORazepam  0-4 mg Intravenous Q6H   Followed by  . [START ON 07/08/2018] LORazepam  0-4 mg Intravenous Q12H  . multivitamin with minerals  1 tablet Oral Daily  . pantoprazole  40 mg Oral Daily  . thiamine  100 mg Oral Daily   Or  . thiamine  100 mg Intravenous Daily    Results for orders placed or performed during the hospital encounter of 07/05/18 (from the past 48 hour(s))  Comprehensive metabolic panel     Status: Abnormal   Collection Time: 07/05/18 11:56 AM  Result Value Ref Range   Sodium 136 135 - 145 mmol/L   Potassium 4.3 3.5 - 5.1 mmol/L   Chloride 101 98 - 111 mmol/L   CO2 18 (L) 22 - 32 mmol/L   Glucose, Bld 158 (H) 70 - 99 mg/dL   BUN 15 8 - 23   mg/dL   Creatinine, Ser 1.50 (H) 0.61 - 1.24 mg/dL   Calcium 8.8 (L) 8.9 - 10.3 mg/dL   Total Protein 7.4 6.5 - 8.1 g/dL   Albumin 3.2 (L) 3.5 - 5.0 g/dL   AST 57 (H) 15 - 41 U/L   ALT 19 0 - 44 U/L   Alkaline Phosphatase 87 38 - 126 U/L   Total Bilirubin 1.2 0.3 - 1.2 mg/dL   GFR calc non Af Amer 46 (L) >60 mL/min   GFR calc Af Amer 54 (L) >60 mL/min    Comment: (NOTE) The eGFR has been calculated using the CKD EPI equation. This calculation has not been validated in all clinical situations. eGFR's persistently <60 mL/min signify possible Chronic Kidney Disease.    Anion gap 17 (H) 5 - 15    Comment: Performed at [facility], [address] [place], [place] [address]  CBC with  Differential     Status: Abnormal   Collection Time: 07/05/18 11:56 AM  Result Value Ref Range   WBC 14.3 (H) 4.0 - 10.5 K/uL   RBC 3.59 (L) 4.22 - 5.81 MIL/uL   Hemoglobin 10.2 (L) 13.0 - 17.0 g/dL   HCT 32.7 (L) 39.0 - 52.0 %   MCV 91.1 78.0 - 100.0 fL   MCH 28.4 26.0 - 34.0 pg   MCHC 31.2 30.0 - 36.0 g/dL   RDW 15.9 (H) 11.5 - 15.5 %   Platelets 370 150 - 400 K/uL   Neutrophils Relative % 85 %   Neutro Abs 12.1 (H) 1.7 - 7.7 K/uL   Lymphocytes Relative 10 %   Lymphs Abs 1.4 0.7 - 4.0 K/uL   Monocytes Relative 4 %   Monocytes Absolute 0.5 0.1 - 1.0 K/uL   Eosinophils Relative 1 %   Eosinophils Absolute 0.2 0.0 - 0.7 K/uL   Basophils Relative 0 %   Basophils Absolute 0.0 0.0 - 0.1 K/uL    Comment: Performed at [facility], [address] [place], [place] [address]  Culture, blood (Routine x 2)     Status: None (Preliminary result)   Collection Time: 07/05/18 11:56 AM  Result Value Ref Range   Specimen Description BLOOD LEFT ARM    Special Requests      BOTTLES DRAWN AEROBIC AND ANAEROBIC Blood Culture adequate volume   Culture      NO GROWTH < 24 HOURS Performed at [facility], [address] [place], [place] [address]    Report Status PENDING   Protime-INR     Status: None   Collection Time: 07/05/18 11:56 AM  Result Value Ref Range   Prothrombin Time 14.2 11.4 - 15.2 seconds   INR 1.10     Comment: Performed at [facility], [address] [place], [place] [address]  Culture, blood (Routine x 2)     Status: None (Preliminary result)   Collection Time: 07/05/18 12:02 PM  Result Value Ref Range   Specimen Description BLOOD RIGHT HAND    Special Requests      BOTTLES DRAWN AEROBIC AND ANAEROBIC Blood Culture results may not be optimal due to an inadequate volume of blood received in culture bottles   Culture      NO GROWTH < 24 HOURS Performed at [facility], [address] [place],  [address]    Report Status PENDING   Type and screen     Status: None    Collection Time: 07/05/18 12:02 PM  Result Value Ref Range   ABO/RH(D)   A POS    Antibody Screen NEG    Sample Expiration      07/08/2018 Performed at [facility], [address] [place], [place] [address]   I-Stat CG4 Lactic Acid, ED     Status: Abnormal   Collection Time: 07/05/18 12:48 PM  Result Value Ref Range   Lactic Acid, Venous 3.85 (HH) 0.5 - 1.9 mmol/L   Comment NOTIFIED PHYSICIAN   Body fluid cell count with differential     Status: Abnormal   Collection Time: 07/05/18  1:35 PM  Result Value Ref Range   Fluid Type-FCT PLEURAL     Comment: CORRECTED ON 08/23 AT 1406: PREVIOUSLY REPORTED AS Pleural, L   Color, Fluid AMBER (A) YELLOW   Appearance, Fluid CLOUDY (A) CLEAR   WBC, Fluid 100,022 (H) 0 - 1,000 cu mm   Neutrophil Count, Fluid 96 (H) 0 - 25 %   Lymphs, Fluid 4 %   Monocyte-Macrophage-Serous Fluid 0 (L) 50 - 90 %   Eos, Fluid 0 %    Comment: Performed at [facility], [address] [place], [place] [address]  Gram stain     Status: None   Collection Time: 07/05/18  1:35 PM  Result Value Ref Range   Specimen Description PLEURAL    Special Requests NONE    Gram Stain      NO ORGANISMS SEEN WBC PRESENT, PREDOMINANTLY PMN CYTOSPIN SMEAR Performed at [facility], [address] [place], [place] [address]    Report Status 07/05/2018 FINAL   Albumin, pleural or peritoneal fluid     Status: None   Collection Time: 07/05/18  1:35 PM  Result Value Ref Range   Albumin, Fluid 2.1 g/dL    Comment: (NOTE) No normal range established for this test Results should be evaluated in conjunction with serum values    Fluid Type-FALB PLEURAL     Comment: Performed at [facility], [address] [place], [place] [address] CORRECTED ON 08/23 AT 1406: PREVIOUSLY REPORTED AS Pleural, L   Protein, pleural or peritoneal fluid     Status: None   Collection Time: 07/05/18  1:35 PM  Result Value Ref Range   Total protein, fluid 4.1 g/dL    Comment: (NOTE) No normal range  established for this test Results should be evaluated in conjunction with serum values    Fluid Type-FTP PLEURAL     Comment: Performed at [facility], [address] [place], [place] [address] CORRECTED ON 08/23 AT 1406: PREVIOUSLY REPORTED AS Pleural, L   Glucose, pleural or peritoneal fluid     Status: None   Collection Time: 07/05/18  1:35 PM  Result Value Ref Range   Glucose, Fluid 7 mg/dL    Comment: (NOTE) No normal range established for this test Results should be evaluated in conjunction with serum values    Fluid Type-FGLU PLEURAL     Comment: Performed at  Hospital, [address] [place], [place] [address] CORRECTED ON 08/23 AT 1406: PREVIOUSLY REPORTED AS Pleural, L   Culture, body fluid-bottle     Status: None (Preliminary result)   Collection Time: 07/05/18  1:35 PM  Result Value Ref Range   Specimen Description PLEURAL    Special Requests BOTTLES DRAWN AEROBIC AND ANAEROBIC 10CC    Culture      NO GROWTH < 24 HOURS Performed at  Hospital, [address] [place], [place] [address]    Report Status PENDING   Influenza panel by PCR (type A &   B)     Status: None   Collection Time: 07/05/18  6:44 PM  Result Value Ref Range   Influenza A By PCR NEGATIVE NEGATIVE   Influenza B By PCR NEGATIVE NEGATIVE    Comment: (NOTE) The Xpert Xpress Flu assay is intended as an aid in the diagnosis of  influenza and should not be used as a sole basis for treatment.  This  assay is FDA approved for nasopharyngeal swab specimens only. Nasal  washings and aspirates are unacceptable for Xpert Xpress Flu testing. Performed at [facility], [address], [place], [place] [address]   Lactic acid, plasma     Status: Abnormal   Collection Time: 07/05/18  7:18 PM  Result Value Ref Range   Lactic Acid, Venous 3.3 (HH) 0.5 - 1.9 mmol/L    Comment: CRITICAL RESULT CALLED TO, READ BACK BY AND VERIFIED WITH: HOPKINS,C ON 07/05/18 AT 2020 BY LOY,C Performed at [facility],  [address], [place], [place] [address]   Comprehensive metabolic panel     Status: Abnormal   Collection Time: 07/06/18  5:10 AM  Result Value Ref Range   Sodium 135 135 - 145 mmol/L   Potassium 5.0 3.5 - 5.1 mmol/L   Chloride 105 98 - 111 mmol/L   CO2 21 (L) 22 - 32 mmol/L   Glucose, Bld 145 (H) 70 - 99 mg/dL   BUN 25 (H) 8 - 23 mg/dL   Creatinine, Ser 1.76 (H) 0.61 - 1.24 mg/dL   Calcium 7.3 (L) 8.9 - 10.3 mg/dL   Total Protein 5.6 (L) 6.5 - 8.1 g/dL   Albumin 2.3 (L) 3.5 - 5.0 g/dL   AST 35 15 - 41 U/L   ALT 17 0 - 44 U/L   Alkaline Phosphatase 48 38 - 126 U/L   Total Bilirubin 0.6 0.3 - 1.2 mg/dL   GFR calc non Af Amer 38 (L) >60 mL/min   GFR calc Af Amer 44 (L) >60 mL/min    Comment: (NOTE) The eGFR has been calculated using the CKD EPI equation. This calculation has not been validated in all clinical situations. eGFR's persistently <60 mL/min signify possible Chronic Kidney Disease.    Anion gap 9 5 - 15    Comment: Performed at [facility], [address] [place], [place] [address]  CBC     Status: Abnormal   Collection Time: 07/06/18  5:10 AM  Result Value Ref Range   WBC 16.0 (H) 4.0 - 10.5 K/uL   RBC 3.01 (L) 4.22 - 5.81 MIL/uL   Hemoglobin 8.4 (L) 13.0 - 17.0 g/dL   HCT 27.0 (L) 39.0 - 52.0 %   MCV 89.7 78.0 - 100.0 fL   MCH 27.9 26.0 - 34.0 pg   MCHC 31.1 30.0 - 36.0 g/dL   RDW 16.1 (H) 11.5 - 15.5 %   Platelets 221 150 - 400 K/uL    Comment: Performed at [facility], [address] [place], [place] [address]  Strep pneumoniae urinary antigen     Status: None   Collection Time: 07/06/18  6:00 AM  Result Value Ref Range   Strep Pneumo Urinary Antigen NEGATIVE NEGATIVE    Comment:        Infection due to S. pneumoniae cannot be absolutely ruled out since the antigen present may be below the detection limit of the test. Performed at [facility], [address], [place], [place] [address]   Lactic acid, plasma     Status:  Abnormal   Collection Time:  07/06/18  5:14 PM  Result Value Ref Range   Lactic Acid, Venous 2.2 (HH) 0.5 - 1.9 mmol/L    Comment: CRITICAL RESULT CALLED TO, READ BACK BY AND VERIFIED WITH: J.GANOE,RN 1826 07/06/18 CLARK,S Performed at [facility], [address] [place], [place] [address]   Lipase, blood     Status: None   Collection Time: 07/06/18  5:23 PM  Result Value Ref Range   Lipase 44 11 - 51 U/L    Comment: Performed at [facility], [address] [place], [place] [address]    Dg Chest 1 View  Result Date: 07/05/2018 CLINICAL DATA:  Post LEFT thoracentesis EXAM: CHEST  1 VIEW COMPARISON:  Earlier study 07/05/2018 FINDINGS: No pneumothorax following LEFT thoracentesis. Persistent LEFT pleural effusion and basilar atelectasis accentuated by expiratory technique. RIGHT lung clear. Bones demineralized. IMPRESSION: No pneumothorax following LEFT thoracentesis. Electronically Signed   By: Mark  Boles M.D.   On: 07/05/2018 14:25   Ct Chest Wo Contrast  Result Date: 07/06/2018 CLINICAL DATA:  Shortness of breath, chest pain. History of prostate cancer. EXAM: CT CHEST WITHOUT CONTRAST TECHNIQUE: Multidetector CT imaging of the chest was performed following the standard protocol without IV contrast. COMPARISON:  Radiograph of same day. FINDINGS: Cardiovascular: No significant vascular findings. Normal heart size. No pericardial effusion. Mediastinum/Nodes: No enlarged mediastinal or axillary lymph nodes. Thyroid gland, trachea, and esophagus demonstrate no significant findings. Lungs/Pleura: No pneumothorax is noted. Large left pleural effusion is noted with adjacent subsegmental atelectasis of left upper lobe and complete atelectasis of left lower lobe. Mild right posterior basilar subsegmental atelectasis is noted. Upper Abdomen: Fatty infiltration of the liver is noted. There are multiple nodular densities above the left kidney which may be inflammatory in etiology. Possible loculated subdiaphragmatic fluid  collection is noted on the left as well. Musculoskeletal: No chest wall mass or suspicious bone lesions identified. IMPRESSION: Large left pleural effusion is noted with associated subsegmental atelectasis of left upper lobe and complete atelectasis of left lower lobe. Fatty infiltration of the liver. Multiple nodular densities are noted above the left kidney as well as possible loculated left subdiaphragmatic fluid collection. CT scan of the abdomen and pelvis with intravenous contrast is recommended for further evaluation. Electronically Signed   By: James  Green Jr, M.D.   On: 07/06/2018 14:18   Dg Chest Port 1 View  Result Date: 07/06/2018 CLINICAL DATA:  Dyspnea. History of left pleural effusion and left thoracentesis. EXAM: PORTABLE CHEST 1 VIEW COMPARISON:  Chest radiograph 07/05/2018 FINDINGS: Increased densities throughout the left hemithorax suggests increased left pleural fluid. Left pleural effusion appears to be large for size. Limited evaluation of the heart and mediastinum due to the left chest densities. Cannot exclude some mediastinal shift towards the right. No focal disease in the right lung. IMPRESSION: Markedly increased densities throughout the left hemithorax. Findings are suggestive for a large left pleural effusion. Electronically Signed   By: Adam  Henn M.D.   On: 07/06/2018 13:17   Dg Chest Port 1 View  Result Date: 07/05/2018 CLINICAL DATA:  Shortness of breath. EXAM: PORTABLE CHEST 1 VIEW COMPARISON:  Chest x-ray dated June 24, 2018. FINDINGS: The left heart border is obscured. Interval enlargement of the left pleural effusion with likely complete collapse of the left lower lobe and partial collapse of the lingula. The right lung is clear. No pneumothorax. No acute osseous abnormality. IMPRESSION: 1. Worsening moderate to large left pleural effusion   with left lower lobe and lingular collapse. Electronically Signed   By: Titus Dubin M.D.   On: 07/05/2018 11:38   US  Thoracentesis Asp Pleural Space W/img Guide  Result Date: 07/05/2018 INDICATION: LEFT pleural effusion question empyema EXAM: ULTRASOUND GUIDED DIAGNOSTIC AND THERAPEUTIC LEFT THORACENTESIS MEDICATIONS: None. COMPLICATIONS: None immediate. PROCEDURE: Procedure, benefits, and risks of procedure were discussed with patient. Written informed consent for procedure was obtained. Time out protocol followed. Pleural effusion localized by ultrasound at the posterior LEFT hemithorax. Skin prepped and draped in usual sterile fashion. Skin and soft tissues anesthetized with 10 mL of 1% lidocaine. 8 French thoracentesis catheter placed into the LEFT pleural space. 500 mL of cloudy tan-colored fluid aspirated from the LEFT pleural space by syringe and vacuum bottle. Procedure terminated prematurely due to patient discomfort and wanting to lie down. Procedure tolerated well by patient without immediate complication. FINDINGS: A total of approximately 500 mL of LEFT pleural fluid was removed. Samples were sent to the laboratory as requested by the clinical team. IMPRESSION: Successful ultrasound guided LEFT thoracentesis yielding 500 mL of pleural fluid. Electronically Signed   By: Lavonia Dana M.D.   On: 07/05/2018 13:56  I personally reviewed the CXR and CT images and concur with the findings noted above  Review of Systems  Constitutional: Positive for fever and malaise/fatigue. Negative for chills.  Respiratory: Positive for cough (nonproductive) and shortness of breath.   Cardiovascular: Positive for chest pain (left side pleuritic).  Gastrointestinal: Positive for blood in stool (2 weeks ago).       Recent GI bleed  Neurological: Positive for seizures (history, none recently).   Blood pressure (!) 92/50, pulse (!) 119, temperature 100.1 F (37.8 C), temperature source Oral, resp. rate (!) 32, height 5' 5" (1.651 m), weight 65.4 kg, SpO2 97 %. Physical Exam  Vitals reviewed. Constitutional: He is oriented to  person, place, and time. No distress.  Appears older than stated age  HENT:  Head: Normocephalic and atraumatic.  Mouth/Throat: No oropharyngeal exudate.  Eyes: Conjunctivae are normal. No scleral icterus.  Neck: Neck supple. No tracheal deviation present. No thyromegaly present.  Cardiovascular: Exam reveals no gallop and no friction rub.  No murmur heard. Tachycardic, regular  Respiratory: No respiratory distress. He has no wheezes. He has no rales.  Absent BS on left, clear on right  GI: Soft. He exhibits no distension. There is no tenderness.  Musculoskeletal: He exhibits no edema.  Lymphadenopathy:    He has no cervical adenopathy.  Neurological: He is alert and oriented to person, place, and time. No cranial nerve deficit. He exhibits normal muscle tone.  Skin: Skin is warm and dry. He is not diaphoretic.    Assessment/Plan: 67 yo man with a history of ethanol abuse and a recent GI bleed who presents with left sided CP, shortness of breath and fever. Workup revealed a left empyema. He needs drainage and antibiotics to treat the empyema and underlying pneumonia, likely an aspiration pneumonia.   Options include pigtail cath + thrombolytics, chest tube and VATS. I think in his case a VATS for complete drainage and decortication is the best option.  I discussed the general nature of the procedure, the need for general anesthesia, the  incisions to be used and the need for drainage tubes postoperatively with Mr. Coad. We discussed the expected hospital stay, overall recovery and short and long term outcomes. I informed him of the indications, risks, benefits and alternatives. He understands the risks include, but  are not limited to death, stroke, MI, DVT/PE, bleeding, possible need for transfusion, infections, air leaks, as well as other organ system dysfunction including respiratory, renal, or GI complications.   He accepts the risks and agrees to proceed.  Will take to OR  tomorrow AM.   Fluid resuscitation and Iv antibiotics underway  [name] 07/06/2018, 7:32 PM

## 2018-07-06 NOTE — Consult Note (Signed)
Reason for Consult: Left empyema Referring Physician: [facility]  Clifford [name] is an 67 y.o. male.  HPI: 67 yo man with a history of ethanol abuse, seizures, hyperlipidemia, aspiration pneumonia, prostate cancer and recent GI bleed who presented to [facility] yesterday with left sided Cp and shortness of breath. Was tachycardic and febrile. CXR showed a left effusion. Thoracentesis yesterday c/w empyema.Started on empiric antibiotics with Zosyn.  Transferred to [facility] for further management.   On arrival fever to 102.7 and tachycardic to 125.  Currently denies pain and shortness of breath at rest.  Past Medical History:  Diagnosis Date  . Alcoholism ([place])   . Anemia   . High cholesterol   . Hypertension   . Prostate cancer ([place])   . Seizures ([place])     Past Surgical History:  Procedure Laterality Date  . CIRCUMCISION    . COLONOSCOPY N/A 09/13/2016   Procedure: COLONOSCOPY;  Surgeon: Rogene Houston, MD;  Location: [facility] END[facility] SU[facility];  Service: Endoscopy;  Laterality: N/A;  2:15  . COLONOSCOPY N/A 04/06/2018   Procedure: COLONOSCOPY;  Surgeon: Rogene Houston, MD;  Location: [facility];  Service: Endoscopy;  Laterality: N/A;  . FLEXIBLE SIGMOIDOSCOPY N/A 01/10/2018   Procedure: FLEXIBLE SIGMOIDOSCOPY;  Surgeon: Rogene Houston, MD;  Location: [facility];  Service: Endoscopy;  Laterality: N/A;  1:55  . FLEXIBLE SIGMOIDOSCOPY N/A 06/08/2018   Procedure: FLEXIBLE SIGMOIDOSCOPY;  Surgeon: Danie Binder, MD;  Location: [facility];  Service: Endoscopy;  Laterality: N/A;  . HEMORRHOID SURGERY N/A 04/09/2018   Procedure: HEMORRHOIDECTOMY;  Surgeon: Aviva Signs, MD;  Location: [facility] ORS;  Service: General;  Laterality: N/A;  . NO PAST SURGERIES    . POLYPECTOMY  04/06/2018   Procedure: POLYPECTOMY;  Surgeon: Rogene Houston, MD;  Location: [facility];  Service: Endoscopy;;  sigmoid  . PROSTATE BIOPSY      Family History  Problem Relation Age of Onset  . Alzheimer's  disease Mother   . Hypertension Mother   . Hypertension Father   . Stroke Father   . Prostate cancer Father   . Hypertension Brother     Social History:  reports that he has never smoked. He has never used smokeless tobacco. He reports that he drinks alcohol. He reports that he does not use drugs.  Allergies:  Allergies  Allergen Reactions  . Lisinopril Swelling    Medications:  Scheduled: . B-complex with vitamin C  1 tablet Oral q morning - 09W  . folic acid  1 mg Oral Daily  . heparin  5,000 Units Subcutaneous Q8H  . LORazepam  0-4 mg Intravenous Q6H   Followed by  . [START ON 07/08/2018] LORazepam  0-4 mg Intravenous Q12H  . multivitamin with minerals  1 tablet Oral Daily  . pantoprazole  40 mg Oral Daily  . thiamine  100 mg Oral Daily   Or  . thiamine  100 mg Intravenous Daily    Results for orders placed or performed during the hospital encounter of 07/05/18 (from the past 48 hour(s))  Comprehensive metabolic panel     Status: Abnormal   Collection Time: 07/05/18 11:56 AM  Result Value Ref Range   Sodium 136 135 - 145 mmol/L   Potassium 4.3 3.5 - 5.1 mmol/L   Chloride 101 98 - 111 mmol/L   CO2 18 (L) 22 - 32 mmol/L   Glucose, Bld 158 (H) 70 - 99 mg/dL   BUN 15 8 - 23  mg/dL   Creatinine, Ser 1.50 (H) 0.61 - 1.24 mg/dL   Calcium 8.8 (L) 8.9 - 10.3 mg/dL   Total Protein 7.4 6.5 - 8.1 g/dL   Albumin 3.2 (L) 3.5 - 5.0 g/dL   AST 57 (H) 15 - 41 U/L   ALT 19 0 - 44 U/L   Alkaline Phosphatase 87 38 - 126 U/L   Total Bilirubin 1.2 0.3 - 1.2 mg/dL   GFR calc non Af Amer 46 (L) >60 mL/min   GFR calc Af Amer 54 (L) >60 mL/min    Comment: (NOTE) The eGFR has been calculated using the CKD EPI equation. This calculation has not been validated in all clinical situations. eGFR's persistently <60 mL/min signify possible Chronic Kidney Disease.    Anion gap 17 (H) 5 - 15    Comment: Performed at [facility], [address], [place], [place] [address]  CBC with  Differential     Status: Abnormal   Collection Time: 07/05/18 11:56 AM  Result Value Ref Range   WBC 14.3 (H) 4.0 - 10.5 K/uL   RBC 3.59 (L) 4.22 - 5.81 MIL/uL   Hemoglobin 10.2 (L) 13.0 - 17.0 g/dL   HCT 32.7 (L) 39.0 - 52.0 %   MCV 91.1 78.0 - 100.0 fL   MCH 28.4 26.0 - 34.0 pg   MCHC 31.2 30.0 - 36.0 g/dL   RDW 15.9 (H) 11.5 - 15.5 %   Platelets 370 150 - 400 K/uL   Neutrophils Relative % 85 %   Neutro Abs 12.1 (H) 1.7 - 7.7 K/uL   Lymphocytes Relative 10 %   Lymphs Abs 1.4 0.7 - 4.0 K/uL   Monocytes Relative 4 %   Monocytes Absolute 0.5 0.1 - 1.0 K/uL   Eosinophils Relative 1 %   Eosinophils Absolute 0.2 0.0 - 0.7 K/uL   Basophils Relative 0 %   Basophils Absolute 0.0 0.0 - 0.1 K/uL    Comment: Performed at [facility], [address], [place], [place] [address]  Culture, blood (Routine x 2)     Status: None (Preliminary result)   Collection Time: 07/05/18 11:56 AM  Result Value Ref Range   Specimen Description BLOOD LEFT ARM    Special Requests      BOTTLES DRAWN AEROBIC AND ANAEROBIC Blood Culture adequate volume   Culture      NO GROWTH < 24 HOURS Performed at [facility], [address], [place], [place] [address]    Report Status PENDING   Protime-INR     Status: None   Collection Time: 07/05/18 11:56 AM  Result Value Ref Range   Prothrombin Time 14.2 11.4 - 15.2 seconds   INR 1.10     Comment: Performed at [facility], [address] [place], [place] [address]  Culture, blood (Routine x 2)     Status: None (Preliminary result)   Collection Time: 07/05/18 12:02 PM  Result Value Ref Range   Specimen Description BLOOD RIGHT HAND    Special Requests      BOTTLES DRAWN AEROBIC AND ANAEROBIC Blood Culture results may not be optimal due to an inadequate volume of blood received in culture bottles   Culture      NO GROWTH < 24 HOURS Performed at [facility], [address], [place], [place] [address]    Report Status PENDING   Type and screen     Status: None    Collection Time: 07/05/18 12:02 PM  Result Value Ref Range   ABO/RH(D)  A POS    Antibody Screen NEG    Sample Expiration      07/08/2018 Performed at [facility], [address], [place], [place] [address]   I-Stat CG4 Lactic Acid, ED     Status: Abnormal   Collection Time: 07/05/18 12:48 PM  Result Value Ref Range   Lactic Acid, Venous 3.85 (HH) 0.5 - 1.9 mmol/L   Comment NOTIFIED PHYSICIAN   Body fluid cell count with differential     Status: Abnormal   Collection Time: 07/05/18  1:35 PM  Result Value Ref Range   Fluid Type-FCT PLEURAL     Comment: CORRECTED ON 08/23 AT 1406: PREVIOUSLY REPORTED AS Pleural, L   Color, Fluid AMBER (A) YELLOW   Appearance, Fluid CLOUDY (A) CLEAR   WBC, Fluid 100,022 (H) 0 - 1,000 cu mm   Neutrophil Count, Fluid 96 (H) 0 - 25 %   Lymphs, Fluid 4 %   Monocyte-Macrophage-Serous Fluid 0 (L) 50 - 90 %   Eos, Fluid 0 %    Comment: Performed at [facility], [address], [place], [place] [address]  Gram stain     Status: None   Collection Time: 07/05/18  1:35 PM  Result Value Ref Range   Specimen Description PLEURAL    Special Requests NONE    Gram Stain      NO ORGANISMS SEEN WBC PRESENT, PREDOMINANTLY PMN CYTOSPIN SMEAR Performed at [facility], [address] [place], [place] [address]    Report Status 07/05/2018 FINAL   Albumin, pleural or peritoneal fluid     Status: None   Collection Time: 07/05/18  1:35 PM  Result Value Ref Range   Albumin, Fluid 2.1 g/dL    Comment: (NOTE) No normal range established for this test Results should be evaluated in conjunction with serum values    Fluid Type-FALB PLEURAL     Comment: Performed at [facility], [address] [place], [place] [address] CORRECTED ON 08/23 AT 1406: PREVIOUSLY REPORTED AS Pleural, L   Protein, pleural or peritoneal fluid     Status: None   Collection Time: 07/05/18  1:35 PM  Result Value Ref Range   Total protein, fluid 4.1 g/dL    Comment: (NOTE) No normal range  established for this test Results should be evaluated in conjunction with serum values    Fluid Type-FTP PLEURAL     Comment: Performed at [facility], [address], [place], [place] [address] CORRECTED ON 08/23 AT 1406: PREVIOUSLY REPORTED AS Pleural, L   Glucose, pleural or peritoneal fluid     Status: None   Collection Time: 07/05/18  1:35 PM  Result Value Ref Range   Glucose, Fluid 7 mg/dL    Comment: (NOTE) No normal range established for this test Results should be evaluated in conjunction with serum values    Fluid Type-FGLU PLEURAL     Comment: Performed at [facility], [address], [place], [place] [address] CORRECTED ON 08/23 AT 1406: PREVIOUSLY REPORTED AS Pleural, L   Culture, body fluid-bottle     Status: None (Preliminary result)   Collection Time: 07/05/18  1:35 PM  Result Value Ref Range   Specimen Description PLEURAL    Special Requests BOTTLES DRAWN AEROBIC AND ANAEROBIC 10CC    Culture      NO GROWTH < 24 HOURS Performed at [facility], [address], [place], [place] [address]    Report Status PENDING   Influenza panel by PCR (type A &  B)     Status: None   Collection Time: 07/05/18  6:44 PM  Result Value Ref Range   Influenza A By PCR NEGATIVE NEGATIVE   Influenza B By PCR NEGATIVE NEGATIVE    Comment: (NOTE) The Xpert Xpress Flu assay is intended as an aid in the diagnosis of  influenza and should not be used as a sole basis for treatment.  This  assay is FDA approved for nasopharyngeal swab specimens only. Nasal  washings and aspirates are unacceptable for Xpert Xpress Flu testing. Performed at [facility], [address] [place], [place] [address]   Lactic acid, plasma     Status: Abnormal   Collection Time: 07/05/18  7:18 PM  Result Value Ref Range   Lactic Acid, Venous 3.3 (HH) 0.5 - 1.9 mmol/L    Comment: CRITICAL RESULT CALLED TO, READ BACK BY AND VERIFIED WITH: HOPKINS,C ON 07/05/18 AT 2020 BY LOY,C Performed at [facility],  [address], [place], [place] [address]   Comprehensive metabolic panel     Status: Abnormal   Collection Time: 07/06/18  5:10 AM  Result Value Ref Range   Sodium 135 135 - 145 mmol/L   Potassium 5.0 3.5 - 5.1 mmol/L   Chloride 105 98 - 111 mmol/L   CO2 21 (L) 22 - 32 mmol/L   Glucose, Bld 145 (H) 70 - 99 mg/dL   BUN 25 (H) 8 - 23 mg/dL   Creatinine, Ser 1.76 (H) 0.61 - 1.24 mg/dL   Calcium 7.3 (L) 8.9 - 10.3 mg/dL   Total Protein 5.6 (L) 6.5 - 8.1 g/dL   Albumin 2.3 (L) 3.5 - 5.0 g/dL   AST 35 15 - 41 U/L   ALT 17 0 - 44 U/L   Alkaline Phosphatase 48 38 - 126 U/L   Total Bilirubin 0.6 0.3 - 1.2 mg/dL   GFR calc non Af Amer 38 (L) >60 mL/min   GFR calc Af Amer 44 (L) >60 mL/min    Comment: (NOTE) The eGFR has been calculated using the CKD EPI equation. This calculation has not been validated in all clinical situations. eGFR's persistently <60 mL/min signify possible Chronic Kidney Disease.    Anion gap 9 5 - 15    Comment: Performed at [facility], [address], [place], [place] [address]  CBC     Status: Abnormal   Collection Time: 07/06/18  5:10 AM  Result Value Ref Range   WBC 16.0 (H) 4.0 - 10.5 K/uL   RBC 3.01 (L) 4.22 - 5.81 MIL/uL   Hemoglobin 8.4 (L) 13.0 - 17.0 g/dL   HCT 27.0 (L) 39.0 - 52.0 %   MCV 89.7 78.0 - 100.0 fL   MCH 27.9 26.0 - 34.0 pg   MCHC 31.1 30.0 - 36.0 g/dL   RDW 16.1 (H) 11.5 - 15.5 %   Platelets 221 150 - 400 K/uL    Comment: Performed at [facility], [address], [place], [place] [address]  Strep pneumoniae urinary antigen     Status: None   Collection Time: 07/06/18  6:00 AM  Result Value Ref Range   Strep Pneumo Urinary Antigen NEGATIVE NEGATIVE    Comment:        Infection due to S. pneumoniae cannot be absolutely ruled out since the antigen present may be below the detection limit of the test. Performed at [facility], [address] [place], [place] [address]   Lactic acid, plasma     Status:  Abnormal   Collection Time:  07/06/18  5:14 PM  Result Value Ref Range   Lactic Acid, Venous 2.2 (HH) 0.5 - 1.9 mmol/L    Comment: CRITICAL RESULT CALLED TO, READ BACK BY AND VERIFIED WITH: J.GANOE,RN 1826 07/06/18 CLARK,S Performed at [facility], [place] [address], [place], [place] [address]   Lipase, blood     Status: None   Collection Time: 07/06/18  5:23 PM  Result Value Ref Range   Lipase 44 11 - 51 U/L    Comment: Performed at [facility] [address] [place], [place] [address]    Dg Chest 1 View  Result Date: 07/05/2018 CLINICAL DATA:  Post LEFT thoracentesis EXAM: CHEST  1 VIEW COMPARISON:  Earlier study 07/05/2018 FINDINGS: No pneumothorax following LEFT thoracentesis. Persistent LEFT pleural effusion and basilar atelectasis accentuated by expiratory technique. RIGHT lung clear. Bones demineralized. IMPRESSION: No pneumothorax following LEFT thoracentesis. Electronically Signed   By: Lavonia Dana M.D.   On: 07/05/2018 14:25   Ct Chest Wo Contrast  Result Date: 07/06/2018 CLINICAL DATA:  Shortness of breath, chest pain. History of prostate cancer. EXAM: CT CHEST WITHOUT CONTRAST TECHNIQUE: Multidetector CT imaging of the chest was performed following the standard protocol without IV contrast. COMPARISON:  Radiograph of same day. FINDINGS: Cardiovascular: No significant vascular findings. Normal heart size. No pericardial effusion. Mediastinum/Nodes: No enlarged mediastinal or axillary lymph nodes. Thyroid gland, trachea, and esophagus demonstrate no significant findings. Lungs/Pleura: No pneumothorax is noted. Large left pleural effusion is noted with adjacent subsegmental atelectasis of left upper lobe and complete atelectasis of left lower lobe. Mild right posterior basilar subsegmental atelectasis is noted. Upper Abdomen: Fatty infiltration of the liver is noted. There are multiple nodular densities above the left kidney which may be inflammatory in etiology. Possible loculated subdiaphragmatic fluid  collection is noted on the left as well. Musculoskeletal: No chest wall mass or suspicious bone lesions identified. IMPRESSION: Large left pleural effusion is noted with associated subsegmental atelectasis of left upper lobe and complete atelectasis of left lower lobe. Fatty infiltration of the liver. Multiple nodular densities are noted above the left kidney as well as possible loculated left subdiaphragmatic fluid collection. CT scan of the abdomen and pelvis with intravenous contrast is recommended for further evaluation. Electronically Signed   By: Marijo Conception, M.D.   On: 07/06/2018 14:18   Dg Chest Port 1 View  Result Date: 07/06/2018 CLINICAL DATA:  Dyspnea. History of left pleural effusion and left thoracentesis. EXAM: PORTABLE CHEST 1 VIEW COMPARISON:  Chest radiograph 07/05/2018 FINDINGS: Increased densities throughout the left hemithorax suggests increased left pleural fluid. Left pleural effusion appears to be large for size. Limited evaluation of the heart and mediastinum due to the left chest densities. Cannot exclude some mediastinal shift towards the right. No focal disease in the right lung. IMPRESSION: Markedly increased densities throughout the left hemithorax. Findings are suggestive for a large left pleural effusion. Electronically Signed   By: Markus Daft M.D.   On: 07/06/2018 13:17   Dg Chest Port 1 View  Result Date: 07/05/2018 CLINICAL DATA:  Shortness of breath. EXAM: PORTABLE CHEST 1 VIEW COMPARISON:  Chest x-ray dated June 24, 2018. FINDINGS: The left heart border is obscured. Interval enlargement of the left pleural effusion with likely complete collapse of the left lower lobe and partial collapse of the lingula. The right lung is clear. No pneumothorax. No acute osseous abnormality. IMPRESSION: 1. Worsening moderate to large left pleural effusion  with left lower lobe and lingular collapse. Electronically Signed   By: Titus Dubin M.D.   On: 07/05/2018 11:38   US  Thoracentesis Asp Pleural Space W/img Guide  Result Date: 07/05/2018 INDICATION: LEFT pleural effusion question empyema EXAM: ULTRASOUND GUIDED DIAGNOSTIC AND THERAPEUTIC LEFT THORACENTESIS MEDICATIONS: None. COMPLICATIONS: None immediate. PROCEDURE: Procedure, benefits, and risks of procedure were discussed with patient. Written informed consent for procedure was obtained. Time out protocol followed. Pleural effusion localized by ultrasound at the posterior LEFT hemithorax. Skin prepped and draped in usual sterile fashion. Skin and soft tissues anesthetized with 10 mL of 1% lidocaine. 8 French thoracentesis catheter placed into the LEFT pleural space. 500 mL of cloudy tan-colored fluid aspirated from the LEFT pleural space by syringe and vacuum bottle. Procedure terminated prematurely due to patient discomfort and wanting to lie down. Procedure tolerated well by patient without immediate complication. FINDINGS: A total of approximately 500 mL of LEFT pleural fluid was removed. Samples were sent to the laboratory as requested by the clinical team. IMPRESSION: Successful ultrasound guided LEFT thoracentesis yielding 500 mL of pleural fluid. Electronically Signed   By: Lavonia Dana M.D.   On: 07/05/2018 13:56  I personally reviewed the CXR and CT images and concur with the findings noted above  Review of Systems  Constitutional: Positive for fever and malaise/fatigue. Negative for chills.  Respiratory: Positive for cough (nonproductive) and shortness of breath.   Cardiovascular: Positive for chest pain (left side pleuritic).  Gastrointestinal: Positive for blood in stool (2 weeks ago).       Recent GI bleed  Neurological: Positive for seizures (history, none recently).   Blood pressure (!) 92/50, pulse (!) 119, temperature 100.1 F (37.8 C), temperature source Oral, resp. rate (!) 32, height 5' 5" (1.651 m), weight 65.4 kg, SpO2 97 %. Physical Exam  Vitals reviewed. Constitutional: He is oriented to  person, place, and time. No distress.  Appears older than stated age  HENT:  Head: Normocephalic and atraumatic.  Mouth/Throat: No oropharyngeal exudate.  Eyes: Conjunctivae are normal. No scleral icterus.  Neck: Neck supple. No tracheal deviation present. No thyromegaly present.  Cardiovascular: Exam reveals no gallop and no friction rub.  No murmur heard. Tachycardic, regular  Respiratory: No respiratory distress. He has no wheezes. He has no rales.  Absent BS on left, clear on right  GI: Soft. He exhibits no distension. There is no tenderness.  Musculoskeletal: He exhibits no edema.  Lymphadenopathy:    He has no cervical adenopathy.  Neurological: He is alert and oriented to person, place, and time. No cranial nerve deficit. He exhibits normal muscle tone.  Skin: Skin is warm and dry. He is not diaphoretic.    Assessment/Plan: 67 yo man with a history of ethanol abuse and a recent GI bleed who presents with left sided CP, shortness of breath and fever. Workup revealed a left empyema. He needs drainage and antibiotics to treat the empyema and underlying pneumonia, likely an aspiration pneumonia.   Options include pigtail cath + thrombolytics, chest tube and VATS. I think in his case a VATS for complete drainage and decortication is the best option.  I discussed the general nature of the procedure, the need for general anesthesia, the  incisions to be used and the need for drainage tubes postoperatively with Mr. Coad. We discussed the expected hospital stay, overall recovery and short and long term outcomes. I informed him of the indications, risks, benefits and alternatives. He understands the risks include, but  are not limited to death, stroke, MI, DVT/PE, bleeding, possible need for transfusion, infections, air leaks, as well as other organ system dysfunction including respiratory, renal, or GI complications.   He accepts the risks and agrees to proceed.  Will take to OR  tomorrow AM.   Fluid resuscitation and Iv antibiotics underway  [name] 07/06/2018, 7:32 PM

## 2018-07-06 NOTE — Progress Notes (Addendum)
07/06/2018 Patient transfer from [facility] to 2W21 via [place] at 1630. He is alert, oriented and ambulatory. Patient came in he is having some work of breathing and on oxygen. Patient abdomen is distended, skin is intact. He was placed on a stepdown monitor and received CHG bath. Dr Cruzita Lederer was made aware patient arrived on the unit. [facility] RN.

## 2018-07-06 NOTE — Progress Notes (Signed)
CRITICAL VALUE ALERT  Critical Value: Lactic acid 2.2  Date & Time Notied:  07/06/18 1835  Provider Notified: Costin gherghe  Orders Received/Actions taken: No new orders received at this time.

## 2018-07-06 NOTE — Progress Notes (Signed)
Called to pt's room due to c/o of worsening pain to left side and increased respiratory effort.  Arrived to find pt with RR 36 & shallow, O2 sat 96% 1L [place], HR 114, BP 129/71.  Pt states sharp pain to left side "hit him all at once".  Ativan 1mg  IV given.

## 2018-07-06 NOTE — Progress Notes (Signed)
Pt c/o pain & SOB again.  Dr. Cathlean Sauer at bedside.

## 2018-07-06 NOTE — Progress Notes (Signed)
Pt admitted to 2W21 from [facility]. Pt oriented to unit. Assessments per flowsheets. New IV inserted and fluid bolus and IV ABX initiated per orders. Pt is AOx4. HR is tachycardic. Service at bedside to assess pt. Pt to remain NPO for possible chest tube insertion or surgical procedures with CT surg.

## 2018-07-06 NOTE — Progress Notes (Signed)
Pharmacy Antibiotic Note  Clifford [name] is a 68 y.o. male admitted on 07/05/2018 with aspiration PNA. Patient previously on Unasyn and azithromycin. WBC worsening 14.3>>16 and continues to have fever, tmax 102.7. Pharmacy has now been consulted for Zosyn dosing. Scr trending up, 1.76. Estimated CrCl ~35 mL/min.  Plan: Zosyn 3.375g IV q8h (extended infusion) F/u clinical status, C&S, renal function, LOT  Height: 5\' 5"  (165.1 cm) Weight: 144 lb 2.9 oz (65.4 kg) IBW/kg (Calculated) : 61.5  Temp (24hrs), Avg:100 F (37.8 C), Min:97.8 F (36.6 C), Max:102.7 F (39.3 C)  Recent Labs  Lab 07/05/18 1156 07/05/18 1248 07/05/18 1918 07/06/18 0510  WBC 14.3*  --   --  16.0*  CREATININE 1.50*  --   --  1.76*  LATICACIDVEN  --  3.85* 3.3*  --     Estimated Creatinine Clearance: 35.4 mL/min (A) (by C-G formula based on SCr of 1.76 mg/dL (H)).    Allergies  Allergen Reactions  . Lisinopril Swelling   Antimicrobials this admission: Rocephin x 1 in ED 8/23 Unasyn 8/23 >> 8/24 Azithromycin 8/23 >> 8/24  Zosyn 8/24 >>  Microbiology results: 8/23 BCx: NG < 24h 8/23 pleural Cx: NG < 24h 8/23 sputum Cx: to be collected  Thank you for allowing pharmacy to be a part of this patient's care.  Mila Merry Gerarda Fraction, PharmD PGY2 Infectious Diseases Pharmacy Resident Phone: [phone] 07/06/2018 4:52 PM

## 2018-07-06 NOTE — Progress Notes (Addendum)
Patient seen and examined bedside. Mr. Clifford [name] is transferred from [facility] where he presented yesterday with left sided chest pain, cough, fevers, found to have a pleural effusion and concerns for aspiration pneumonia. He was placed on Unasyn and underwent a thoracentesis. Fluid showed evidence of empyema with WBC > 100,000 with 96% neutrophils. Fluid and blood cultures have remained negative. Case was discussed with Dr. Roxan Hockey with cardiothoracic surgery who will see patient in consultation.   Subjective -pain controled, he is thirsty.    Vitals:   07/06/18 1124 07/06/18 1200 07/06/18 1206 07/06/18 1642  BP:   121/63 (!) 144/65  Pulse: (!) 110  (!) 114 (!) 125  Resp: 20  (!) 24 (!) 39  Temp:   99.2 F (37.3 C) (!) 102.7 F (39.3 C)  TempSrc:   Oral Oral  SpO2: 95% (!) 88% 94% 96%  Weight:      Height:       Constitutional: Tremulous Eyes: PERRL, lids and conjunctivae normal.  ENMT: Mucous membranes are dry  Neck: normal, supple Respiratory: decreased breath sounds on left, no wheezing, no crackles. Tachypneic  Cardiovascular: Regular rate and rhythm, no murmurs / rubs / gallops. Tachycardic. No edema  Abdomen: tender LUQ and mid epigastric. BS + Skin: no rashes, lesions, ulcers. No induration Neurologic: non focal     Plan  Sepsis due to aspiration pneumonia complicated by hypoxic respiratory failure and left loculated empyema -CT scan just prior to transfer shows large left pleural effusion, as well as possible loculated left subdiaphragmatic fluid collection -septic on arrival, will broaden to Zosyn given subdiaphragmatic involvement  -continue fluids -keep NPO until evaluated by Surgery. D/w Dr. Roxan Hockey, appreciate input  ETOH abuse -patient drinks 1.5L bottle of gin daily. Monitor CIWA -suspect withdrawal contributes to his tachycardia   AKI -due to sepsis -bolus now, continue IVF  HTN -hold home meds   Costin M. Cruzita Lederer, MD [facility] [phone]  If 7PM-7AM, please contact night-coverage [email or web address] Password [email or web address]

## 2018-07-07 ENCOUNTER — Encounter (HOSPITAL_COMMUNITY)
Admission: EM | Disposition: A | Discharge status: Home Health | Payer: Self-pay | Source: Home / Self Care | Attending: Internal Medicine

## 2018-07-07 ENCOUNTER — Inpatient Hospital Stay (HOSPITAL_COMMUNITY): Payer: Medicare Other | Admitting: Certified Registered Nurse Anesthetist

## 2018-07-07 ENCOUNTER — Encounter (HOSPITAL_COMMUNITY): Payer: Self-pay | Admitting: Surgery

## 2018-07-07 ENCOUNTER — Inpatient Hospital Stay (HOSPITAL_COMMUNITY): Payer: Medicare Other

## 2018-07-07 HISTORY — PX: VIDEO ASSISTED THORACOSCOPY (VATS)/EMPYEMA: SHX6172

## 2018-07-07 LAB — CBC WITH DIFFERENTIAL/PLATELET
BASOS PCT: 0 %
Basophils Absolute: 0 10*3/uL (ref 0.0–0.1)
EOS PCT: 0 %
Eosinophils Absolute: 0 10*3/uL (ref 0.0–0.7)
HEMATOCRIT: 23.5 % — AB (ref 39.0–52.0)
HEMOGLOBIN: 7.3 g/dL — AB (ref 13.0–17.0)
LYMPHS PCT: 4 %
Lymphs Abs: 0.5 10*3/uL — ABNORMAL LOW (ref 0.7–4.0)
MCH: 28.2 pg (ref 26.0–34.0)
MCHC: 31.1 g/dL (ref 30.0–36.0)
MCV: 90.7 fL (ref 78.0–100.0)
MONOS PCT: 8 %
Monocytes Absolute: 1 10*3/uL (ref 0.1–1.0)
NEUTROS ABS: 10.9 10*3/uL — AB (ref 1.7–7.7)
NEUTROS PCT: 88 %
Platelets: 159 10*3/uL (ref 150–400)
RBC: 2.59 MIL/uL — ABNORMAL LOW (ref 4.22–5.81)
RDW: 15.6 % — AB (ref 11.5–15.5)
WBC: 12.4 10*3/uL — ABNORMAL HIGH (ref 4.0–10.5)

## 2018-07-07 LAB — PREPARE RBC (CROSSMATCH)

## 2018-07-07 LAB — LEGIONELLA PNEUMOPHILA SEROGP 1 UR AG: L. pneumophila Serogp 1 Ur Ag: NEGATIVE

## 2018-07-07 LAB — BASIC METABOLIC PANEL
Anion gap: 9 (ref 5–15)
BUN: 24 mg/dL — ABNORMAL HIGH (ref 8–23)
CALCIUM: 6.7 mg/dL — AB (ref 8.9–10.3)
CHLORIDE: 107 mmol/L (ref 98–111)
CO2: 19 mmol/L — AB (ref 22–32)
CREATININE: 1.73 mg/dL — AB (ref 0.61–1.24)
GFR calc non Af Amer: 39 mL/min — ABNORMAL LOW (ref >60)
GFR, EST AFRICAN AMERICAN: 45 mL/min — AB (ref >60)
GLUCOSE: 120 mg/dL — AB (ref 70–99)
Potassium: 4.1 mmol/L (ref 3.5–5.1)
Sodium: 135 mmol/L (ref 135–145)

## 2018-07-07 LAB — SURGICAL PCR SCREEN
MRSA, PCR: NEGATIVE
STAPHYLOCOCCUS AUREUS: NEGATIVE

## 2018-07-07 LAB — LACTIC ACID, PLASMA: Lactic Acid, Venous: 1.4 mmol/L (ref 0.5–1.9)

## 2018-07-07 LAB — ABO/RH: ABO/RH(D): A POS

## 2018-07-07 LAB — GLUCOSE, CAPILLARY
GLUCOSE-CAPILLARY: 119 mg/dL — AB (ref 70–99)
Glucose-Capillary: 159 mg/dL — ABNORMAL HIGH (ref 70–99)

## 2018-07-07 LAB — HIV ANTIBODY (ROUTINE TESTING W REFLEX): HIV Screen 4th Generation wRfx: NONREACTIVE

## 2018-07-07 SURGERY — VIDEO ASSISTED THORACOSCOPY (VATS)/EMPYEMA
Anesthesia: General | Site: Chest | Laterality: Left

## 2018-07-07 MED ORDER — SODIUM CHLORIDE 0.9% FLUSH: 9.0000 mL | INTRAVENOUS | Status: DC | PRN

## 2018-07-07 MED ORDER — ONDANSETRON HCL 4 MG/2ML IJ SOLN
INTRAMUSCULAR | Status: DC | PRN
  Administered 2018-07-07: 4 mg via INTRAVENOUS

## 2018-07-07 MED ORDER — ROCURONIUM BROMIDE 50 MG/5ML IV SOSY
PREFILLED_SYRINGE | INTRAVENOUS | Status: AC
  Filled 2018-07-07: qty 5

## 2018-07-07 MED ORDER — SUCCINYLCHOLINE CHLORIDE 20 MG/ML IJ SOLN
INTRAMUSCULAR | Status: DC | PRN
  Administered 2018-07-07: 100 mg via INTRAVENOUS

## 2018-07-07 MED ORDER — ONDANSETRON HCL 4 MG/2ML IJ SOLN
INTRAMUSCULAR | Status: AC
  Filled 2018-07-07: qty 2

## 2018-07-07 MED ORDER — ACETAMINOPHEN 10 MG/ML IV SOLN
1000.0000 mg | Freq: Four times a day (QID) | INTRAVENOUS | Status: AC
  Filled 2018-07-07: qty 100

## 2018-07-07 MED ORDER — ONDANSETRON HCL 4 MG/2ML IJ SOLN
INTRAMUSCULAR | Status: AC
  Administered 2018-07-07: 4 mg via INTRAVENOUS
  Filled 2018-07-07: qty 2

## 2018-07-07 MED ORDER — MIDAZOLAM HCL 5 MG/5ML IJ SOLN
INTRAMUSCULAR | Status: DC | PRN
  Administered 2018-07-07 (×2): 1 mg via INTRAVENOUS

## 2018-07-07 MED ORDER — SODIUM CHLORIDE 0.9 % IV SOLN
INTRAVENOUS | Status: DC | PRN
  Administered 2018-07-07: 250 mL via INTRAVENOUS

## 2018-07-07 MED ORDER — ACETAMINOPHEN 160 MG/5ML PO SOLN: 1000.0000 mg | Freq: Four times a day (QID) | ORAL | Status: DC

## 2018-07-07 MED ORDER — MEPERIDINE HCL 50 MG/ML IJ SOLN
INTRAMUSCULAR | Status: AC
  Administered 2018-07-07: 12.5 mg via INTRAVENOUS
  Filled 2018-07-07: qty 1

## 2018-07-07 MED ORDER — PROPOFOL 10 MG/ML IV BOLUS
INTRAVENOUS | Status: DC | PRN
  Administered 2018-07-07: 100 mg via INTRAVENOUS

## 2018-07-07 MED ORDER — HYDROMORPHONE HCL 1 MG/ML IJ SOLN
INTRAMUSCULAR | Status: AC
  Administered 2018-07-07: 0.5 mg via INTRAVENOUS
  Filled 2018-07-07: qty 1

## 2018-07-07 MED ORDER — BISACODYL 5 MG PO TBEC
10.0000 mg | DELAYED_RELEASE_TABLET | Freq: Every day | ORAL | Status: DC
  Administered 2018-07-08: 10 mg via ORAL
  Filled 2018-07-07 (×4): qty 2

## 2018-07-07 MED ORDER — LEVALBUTEROL HCL 0.63 MG/3ML IN NEBU
0.6300 mg | INHALATION_SOLUTION | Freq: Four times a day (QID) | RESPIRATORY_TRACT | Status: DC
  Administered 2018-07-07 – 2018-07-08 (×5): 0.63 mg via RESPIRATORY_TRACT
  Filled 2018-07-07 (×5): qty 3

## 2018-07-07 MED ORDER — TRAMADOL HCL 50 MG PO TABS
50.0000 mg | ORAL_TABLET | Freq: Four times a day (QID) | ORAL | Status: DC | PRN
  Administered 2018-07-12: 100 mg via ORAL
  Filled 2018-07-07: qty 2

## 2018-07-07 MED ORDER — POTASSIUM CHLORIDE 10 MEQ/50ML IV SOLN: 10.0000 meq | Freq: Every day | INTRAVENOUS | Status: DC | PRN

## 2018-07-07 MED ORDER — EPHEDRINE 5 MG/ML INJ
INTRAVENOUS | Status: AC
  Filled 2018-07-07: qty 10

## 2018-07-07 MED ORDER — SODIUM CHLORIDE 0.9% IV SOLUTION: Freq: Once | INTRAVENOUS | Status: DC

## 2018-07-07 MED ORDER — BUPIVACAINE LIPOSOME 1.3 % IJ SUSP: INTRAMUSCULAR | Status: DC | PRN

## 2018-07-07 MED ORDER — MUPIROCIN 2 % EX OINT
1.0000 "application " | TOPICAL_OINTMENT | Freq: Two times a day (BID) | CUTANEOUS | Status: AC
  Administered 2018-07-08 – 2018-07-09 (×3): 1 via NASAL
  Filled 2018-07-07: qty 22

## 2018-07-07 MED ORDER — ONDANSETRON HCL 4 MG/2ML IJ SOLN: 4.0000 mg | Freq: Four times a day (QID) | INTRAMUSCULAR | Status: DC | PRN

## 2018-07-07 MED ORDER — DIPHENHYDRAMINE HCL 12.5 MG/5ML PO ELIX
12.5000 mg | ORAL_SOLUTION | Freq: Four times a day (QID) | ORAL | Status: DC | PRN
  Filled 2018-07-07: qty 5

## 2018-07-07 MED ORDER — MIDAZOLAM HCL 2 MG/2ML IJ SOLN
INTRAMUSCULAR | Status: AC
  Filled 2018-07-07: qty 2

## 2018-07-07 MED ORDER — SUCCINYLCHOLINE CHLORIDE 200 MG/10ML IV SOSY
PREFILLED_SYRINGE | INTRAVENOUS | Status: AC
  Filled 2018-07-07: qty 10

## 2018-07-07 MED ORDER — FENTANYL CITRATE (PF) 100 MCG/2ML IJ SOLN
INTRAMUSCULAR | Status: DC | PRN
  Administered 2018-07-07: 100 ug via INTRAVENOUS
  Administered 2018-07-07 (×2): 50 ug via INTRAVENOUS
  Administered 2018-07-07 (×2): 25 ug via INTRAVENOUS

## 2018-07-07 MED ORDER — DEXAMETHASONE SODIUM PHOSPHATE 10 MG/ML IJ SOLN
INTRAMUSCULAR | Status: AC
  Filled 2018-07-07: qty 2

## 2018-07-07 MED ORDER — SODIUM CHLORIDE 0.9 % IJ SOLN: INTRAMUSCULAR | Status: DC | PRN

## 2018-07-07 MED ORDER — BUPIVACAINE LIPOSOME 1.3 % IJ SUSP
10.0000 mL | Freq: Once | INTRAMUSCULAR | Status: DC
  Filled 2018-07-07 (×2): qty 10

## 2018-07-07 MED ORDER — ACETAMINOPHEN 500 MG PO TABS: 1000.0000 mg | ORAL_TABLET | Freq: Four times a day (QID) | ORAL | Status: DC

## 2018-07-07 MED ORDER — FENTANYL 40 MCG/ML IV SOLN
INTRAVENOUS | Status: DC
  Administered 2018-07-07: 0 ug via INTRAVENOUS
  Administered 2018-07-07: 1000 ug via INTRAVENOUS
  Administered 2018-07-07: 10 ug via INTRAVENOUS
  Administered 2018-07-08: 30 ug via INTRAVENOUS
  Administered 2018-07-08: 40 ug via INTRAVENOUS
  Administered 2018-07-08: 20 ug via INTRAVENOUS
  Administered 2018-07-08: 30 ug via INTRAVENOUS
  Administered 2018-07-09: 40 ug via INTRAVENOUS
  Administered 2018-07-09: 50 ug via INTRAVENOUS
  Administered 2018-07-09: 20 ug via INTRAVENOUS
  Administered 2018-07-09: 30 ug via INTRAVENOUS
  Administered 2018-07-09: 20 ug via INTRAVENOUS
  Administered 2018-07-09: 10 ug via INTRAVENOUS
  Administered 2018-07-10: 0 ug via INTRAVENOUS
  Administered 2018-07-10: 50 ug via INTRAVENOUS
  Administered 2018-07-10: 30 ug via INTRAVENOUS
  Administered 2018-07-11: 10 ug via INTRAVENOUS
  Administered 2018-07-11: 20 ug via INTRAVENOUS
  Filled 2018-07-07: qty 25

## 2018-07-07 MED ORDER — PROPOFOL 10 MG/ML IV BOLUS
INTRAVENOUS | Status: AC
  Filled 2018-07-07: qty 20

## 2018-07-07 MED ORDER — ONDANSETRON HCL 4 MG/2ML IJ SOLN
4.0000 mg | Freq: Once | INTRAMUSCULAR | Status: AC | PRN
  Administered 2018-07-07: 4 mg via INTRAVENOUS

## 2018-07-07 MED ORDER — SENNOSIDES-DOCUSATE SODIUM 8.6-50 MG PO TABS
1.0000 | ORAL_TABLET | Freq: Every day | ORAL | Status: DC
  Administered 2018-07-08 – 2018-07-10 (×3): 1 via ORAL
  Filled 2018-07-07 (×4): qty 1

## 2018-07-07 MED ORDER — HYDROMORPHONE HCL 1 MG/ML IJ SOLN
0.2500 mg | INTRAMUSCULAR | Status: DC | PRN
  Administered 2018-07-07 (×2): 0.5 mg via INTRAVENOUS

## 2018-07-07 MED ORDER — LABETALOL HCL 5 MG/ML IV SOLN
INTRAVENOUS | Status: DC | PRN
  Administered 2018-07-07: 5 mg via INTRAVENOUS

## 2018-07-07 MED ORDER — BUPIVACAINE HCL (PF) 0.5 % IJ SOLN: INTRAMUSCULAR | Status: DC | PRN

## 2018-07-07 MED ORDER — DIPHENHYDRAMINE HCL 50 MG/ML IJ SOLN: 12.5000 mg | Freq: Four times a day (QID) | INTRAMUSCULAR | Status: DC | PRN

## 2018-07-07 MED ORDER — DEXTROSE-NACL 5-0.45 % IV SOLN
INTRAVENOUS | Status: DC
  Administered 2018-07-07 – 2018-07-08 (×2): via INTRAVENOUS

## 2018-07-07 MED ORDER — NALOXONE HCL 0.4 MG/ML IJ SOLN: 0.4000 mg | INTRAMUSCULAR | Status: DC | PRN

## 2018-07-07 MED ORDER — LACTATED RINGERS IV SOLN
INTRAVENOUS | Status: DC | PRN
  Administered 2018-07-07 (×2): via INTRAVENOUS

## 2018-07-07 MED ORDER — METOCLOPRAMIDE HCL 5 MG/ML IJ SOLN
10.0000 mg | Freq: Four times a day (QID) | INTRAMUSCULAR | Status: AC
  Administered 2018-07-08 (×3): 10 mg via INTRAVENOUS
  Filled 2018-07-07 (×3): qty 2

## 2018-07-07 MED ORDER — 0.9 % SODIUM CHLORIDE (POUR BTL) OPTIME
TOPICAL | Status: DC | PRN
  Administered 2018-07-07: 1000 mL

## 2018-07-07 MED ORDER — SUGAMMADEX SODIUM 200 MG/2ML IV SOLN
INTRAVENOUS | Status: DC | PRN
  Administered 2018-07-07: 150 mg via INTRAVENOUS

## 2018-07-07 MED ORDER — INSULIN ASPART 100 UNIT/ML ~~LOC~~ SOLN
0.0000 [IU] | Freq: Three times a day (TID) | SUBCUTANEOUS | Status: DC
  Administered 2018-07-07 – 2018-07-10 (×2): 2 [IU] via SUBCUTANEOUS

## 2018-07-07 MED ORDER — MEPERIDINE HCL 50 MG/ML IJ SOLN
6.2500 mg | INTRAMUSCULAR | Status: DC | PRN
  Administered 2018-07-07 (×2): 12.5 mg via INTRAVENOUS

## 2018-07-07 MED ORDER — ROCURONIUM BROMIDE 100 MG/10ML IV SOLN
INTRAVENOUS | Status: DC | PRN
  Administered 2018-07-07: 20 mg via INTRAVENOUS
  Administered 2018-07-07: 50 mg via INTRAVENOUS

## 2018-07-07 MED ORDER — FENTANYL CITRATE (PF) 250 MCG/5ML IJ SOLN
INTRAMUSCULAR | Status: AC
  Filled 2018-07-07: qty 5

## 2018-07-07 MED ORDER — BUPIVACAINE HCL 0.5 % IJ SOLN
INTRAMUSCULAR | Status: AC
  Filled 2018-07-07: qty 1

## 2018-07-07 MED ORDER — PHENYLEPHRINE 40 MCG/ML (10ML) SYRINGE FOR IV PUSH (FOR BLOOD PRESSURE SUPPORT)
PREFILLED_SYRINGE | INTRAVENOUS | Status: AC
  Filled 2018-07-07: qty 10

## 2018-07-07 MED ORDER — OXYCODONE HCL 5 MG PO TABS
10.0000 mg | ORAL_TABLET | ORAL | Status: DC | PRN
  Administered 2018-07-10: 10 mg via ORAL
  Filled 2018-07-07: qty 2

## 2018-07-07 SURGICAL SUPPLY — 76 items
CANISTER SUCT 3000ML PPV (MISCELLANEOUS) ×3 IMPLANT
CATH MALECOT BARD  28FR (CATHETERS) ×2 IMPLANT
CATH THORACIC 28FR (CATHETERS) ×2 IMPLANT
CATH THORACIC 36FR (CATHETERS) IMPLANT
CATH THORACIC 36FR RT ANG (CATHETERS) IMPLANT
CLIP VESOCCLUDE MED 6/CT (CLIP) IMPLANT
CONN ST 1/4X3/8  BEN (MISCELLANEOUS)
CONN ST 1/4X3/8 BEN (MISCELLANEOUS) IMPLANT
CONN Y 3/8X3/8X3/8  BEN (MISCELLANEOUS)
CONN Y 3/8X3/8X3/8 BEN (MISCELLANEOUS) IMPLANT
CONT SPEC 4OZ CLIKSEAL STRL BL (MISCELLANEOUS) ×6 IMPLANT
DERMABOND ADVANCED (GAUZE/BANDAGES/DRESSINGS) ×2
DERMABOND ADVANCED .7 DNX12 (GAUZE/BANDAGES/DRESSINGS) IMPLANT
DRAIN CHANNEL 28F RND 3/8 FF (WOUND CARE) IMPLANT
DRAIN CHANNEL 32F RND 10.7 FF (WOUND CARE) IMPLANT
DRAPE LAPAROSCOPIC ABDOMINAL (DRAPES) ×3 IMPLANT
ELECT BLADE 6.5 EXT (BLADE) ×3 IMPLANT
ELECT REM PT RETURN 9FT ADLT (ELECTROSURGICAL) ×3
ELECTRODE REM PT RTRN 9FT ADLT (ELECTROSURGICAL) ×1 IMPLANT
GAUZE SPONGE 4X4 12PLY STRL (GAUZE/BANDAGES/DRESSINGS) ×3 IMPLANT
GLOVE SURG SIGNA 7.5 PF LTX (GLOVE) ×6 IMPLANT
GOWN STRL REUS W/ TWL LRG LVL3 (GOWN DISPOSABLE) ×2 IMPLANT
GOWN STRL REUS W/ TWL XL LVL3 (GOWN DISPOSABLE) ×1 IMPLANT
GOWN STRL REUS W/TWL LRG LVL3 (GOWN DISPOSABLE) ×4
GOWN STRL REUS W/TWL XL LVL3 (GOWN DISPOSABLE) ×2
KIT BASIN OR (CUSTOM PROCEDURE TRAY) ×3 IMPLANT
KIT SUCTION CATH 14FR (SUCTIONS) ×3 IMPLANT
KIT TURNOVER KIT B (KITS) ×3 IMPLANT
NDL HYPO 25GX1X1/2 BEV (NEEDLE) ×1 IMPLANT
NDL SPNL 18GX3.5 QUINCKE PK (NEEDLE) IMPLANT
NDL SPNL 22GX3.5 QUINCKE BK (NEEDLE) ×1 IMPLANT
NEEDLE HYPO 25GX1X1/2 BEV (NEEDLE) ×3 IMPLANT
NEEDLE SPNL 18GX3.5 QUINCKE PK (NEEDLE) IMPLANT
NEEDLE SPNL 22GX3.5 QUINCKE BK (NEEDLE) ×3 IMPLANT
NS IRRIG 1000ML POUR BTL (IV SOLUTION) ×9 IMPLANT
PACK CHEST (CUSTOM PROCEDURE TRAY) ×3 IMPLANT
PAD ARMBOARD 7.5X6 YLW CONV (MISCELLANEOUS) ×6 IMPLANT
POUCH ENDO CATCH II 15MM (MISCELLANEOUS) IMPLANT
POUCH SPECIMEN RETRIEVAL 10MM (ENDOMECHANICALS) IMPLANT
SEALANT PROGEL (MISCELLANEOUS) IMPLANT
SEALANT SURG COSEAL 4ML (VASCULAR PRODUCTS) IMPLANT
SEALANT SURG COSEAL 8ML (VASCULAR PRODUCTS) IMPLANT
SOLUTION ANTI FOG 6CC (MISCELLANEOUS) ×3 IMPLANT
SPECIMEN JAR MEDIUM (MISCELLANEOUS) IMPLANT
SPONGE TONSIL TAPE 1 RFD (DISPOSABLE) ×3 IMPLANT
SUT PROLENE 4 0 RB 1 (SUTURE)
SUT PROLENE 4-0 RB1 .5 CRCL 36 (SUTURE) IMPLANT
SUT SILK  1 MH (SUTURE) ×2
SUT SILK 1 MH (SUTURE) ×1 IMPLANT
SUT SILK 1 TIES 10X30 (SUTURE) IMPLANT
SUT SILK 2 0SH CR/8 30 (SUTURE) IMPLANT
SUT SILK 3 0SH CR/8 30 (SUTURE) IMPLANT
SUT VIC AB 0 CTX 27 (SUTURE) IMPLANT
SUT VIC AB 1 CTX 27 (SUTURE) IMPLANT
SUT VIC AB 2-0 CT1 27 (SUTURE)
SUT VIC AB 2-0 CT1 TAPERPNT 27 (SUTURE) IMPLANT
SUT VIC AB 2-0 CTX 36 (SUTURE) IMPLANT
SUT VIC AB 3-0 MH 27 (SUTURE) IMPLANT
SUT VIC AB 3-0 SH 27 (SUTURE)
SUT VIC AB 3-0 SH 27X BRD (SUTURE) IMPLANT
SUT VIC AB 3-0 X1 27 (SUTURE) ×5 IMPLANT
SUT VICRYL 0 UR6 27IN ABS (SUTURE) IMPLANT
SUT VICRYL 2 TP 1 (SUTURE) IMPLANT
SWAB COLLECTION DEVICE MRSA (MISCELLANEOUS) IMPLANT
SWAB CULTURE ESWAB REG 1ML (MISCELLANEOUS) IMPLANT
SYR 10ML LL (SYRINGE) IMPLANT
SYR 30ML LL (SYRINGE) ×3 IMPLANT
SYSTEM SAHARA CHEST DRAIN ATS (WOUND CARE) ×3 IMPLANT
TAPE CLOTH SURG 4X10 WHT LF (GAUZE/BANDAGES/DRESSINGS) ×2 IMPLANT
TIP APPLICATOR SPRAY EXTEND 16 (VASCULAR PRODUCTS) IMPLANT
TOWEL GREEN STERILE (TOWEL DISPOSABLE) ×3 IMPLANT
TOWEL GREEN STERILE FF (TOWEL DISPOSABLE) IMPLANT
TRAP SPECIMEN MUCOUS 40CC (MISCELLANEOUS) IMPLANT
TRAY FOLEY MTR SLVR 16FR STAT (SET/KITS/TRAYS/PACK) ×3 IMPLANT
TROCAR XCEL BLADELESS 5X75MML (TROCAR) ×3 IMPLANT
WATER STERILE IRR 1000ML POUR (IV SOLUTION) ×6 IMPLANT

## 2018-07-07 NOTE — Brief Op Note (Addendum)
07/05/2018 - 07/07/2018  12:52 PM  PATIENT:  Cindee Salt  67 y.o. male  PRE-OPERATIVE DIAGNOSIS:  EMPYEMA  POST-OPERATIVE DIAGNOSIS:  EARLY ORGANIZING EMPYEMA  PROCEDURE:  Procedure(s):  VIDEO ASSISTED THORACOSCOPY (VATS) -Drainage of Empyema -Decortication Lung  SURGEON:  Surgeon(s) and Role:    * [name], MD - Primary  PHYSICIAN ASSISTANT: Ellwood Handler PA-C  ANESTHESIA:   general  EBL:  50 mL   BLOOD ADMINISTERED:none  DRAINS: 28 Blake, 28 Straight Chest tubes   LOCAL MEDICATIONS USED:  NONE  SPECIMEN:  Source of Specimen:  Pleural Fluid, Visceral Pleura  DISPOSITION OF SPECIMEN:  pathology, microbioloty  COUNTS:  YES  TOURNIQUET:  * No tourniquets in log *  DICTATION: .Dragon Dictation  PLAN OF CARE: Admit to inpatient   PATIENT DISPOSITION:  PACU - hemodynamically stable.   Delay start of Pharmacological VTE agent (>24hrs) due to surgical blood loss or risk of bleeding: no

## 2018-07-07 NOTE — Progress Notes (Signed)
Attempt to call report.

## 2018-07-07 NOTE — Anesthesia Preprocedure Evaluation (Signed)
Anesthesia Evaluation  Patient identified by MRN, date of birth, ID band Patient awake    Reviewed: Allergy & Precautions, NPO status , Patient's Chart, lab work & pertinent test results  Airway Mallampati: I  TM Distance: >3 FB Neck ROM: Full    Dental   Pulmonary pneumonia,    Pulmonary exam normal        Cardiovascular hypertension, Pt. on medications Normal cardiovascular exam     Neuro/Psych Seizures -,     GI/Hepatic   Endo/Other    Renal/GU      Musculoskeletal   Abdominal   Peds  Hematology   Anesthesia Other Findings   Reproductive/Obstetrics                             Anesthesia Physical Anesthesia Plan  ASA: III  Anesthesia Plan: General   Post-op Pain Management:    Induction: Intravenous  PONV Risk Score and Plan: 2 and Ondansetron and Treatment may vary due to age or medical condition  Airway Management Planned: Double Lumen EBT  Additional Equipment:   Intra-op Plan:   Post-operative Plan: Possible Post-op intubation/ventilation  Informed Consent: I have reviewed the patients History and Physical, chart, labs and discussed the procedure including the risks, benefits and alternatives for the proposed anesthesia with the patient or authorized representative who has indicated his/her understanding and acceptance.     Plan Discussed with: CRNA and Surgeon  Anesthesia Plan Comments:         Anesthesia Quick Evaluation

## 2018-07-07 NOTE — Progress Notes (Signed)
PROGRESS NOTE  [name] VOZ:[id number] DOB: [birth year] DOA: 07/05/2018 PCP: [name], FNP   LOS: 2 days   Brief Narrative / Interim history: Clifford [name] is transferred from [facility] where he presented yesterday with left sided chest pain, cough, fevers, found to have a pleural effusion and concerns for aspiration pneumonia. He was placed on Unasyn and underwent a thoracentesis. Fluid showed evidence of empyema with WBC > 100,000 with 96% neutrophils.  Thoracic surgery was consulted with plans for VATS 8/25  Assessment & Plan: Principal Problem:   Pleural effusion, left Active Problems:   Alcoholism ([place])   Seizures ([place])   Hyperlipidemia   Essential hypertension   Malignant neoplasm of prostate ([place])   Aspiration pneumonia of both lower lobes due to gastric secretions ([facility])   Sepsis ([place])   Sepsis due to aspiration pneumonia complicated by hypoxic respiratory failure and left loculated empyema -CT scan just prior to transfer shows large left pleural effusion, as well as possible loculated left subdiaphragmatic fluid collection -Continue Zosyn , fluids, afebrile but still appears septic this morning -Patient is scheduled to go to the operating room this morning  ETOH abuse -patient drinks 1.5L bottle of gin daily. Monitor CIWA -suspect withdrawal contributes to his tachycardia  -Did not significantly trigger CIWA overnight  AKI -due to sepsis -Continue IV fluids, creatinine remained persistently elevated and has not changed much when compared to yesterday  Anemia -Hemoglobin slightly worse likely delusional, probably has a component of anemia in the setting of sepsis and potentially bone marrow suppression from long-standing alcohol abuse -Probably needs a unit of blood tomorrow morning unless he gets blood during surgery today  HTN -hold home meds   DVT prophylaxis: SCDs Code Status: Full code Family Communication: Wife and 2 sons are  present at bedside Disposition Plan: To be determined  Consultants:   Cardiothoracic surgery  Procedures:   none  Antimicrobials:  Unasyn 8/22-8/24  Zosyn 8/24-  Subjective: -Left rib pain this morning, but no chest pain, no shortness of breath.  No abdominal pain, nausea or vomiting.  Awaiting surgery.  Objective: Vitals:   07/07/18 0200 07/07/18 0426 07/07/18 0526 07/07/18 0744  BP:  (!) 143/79  121/77  Pulse:  (!) 108 97 (!) 115  Resp:  (!) 22 16 (!) 34  Temp: 99.8 F (37.7 C) 99.3 F (37.4 C) 99 F (37.2 C) 98.8 F (37.1 C)  TempSrc:    Oral  SpO2:  98% 98% 97%  Weight:      Height:        Intake/Output Summary (Last 24 hours) at 07/07/2018 1059 Last data filed at 07/07/2018 0800 Gross per 24 hour  Intake 2140 ml  Output 700 ml  Net 1440 ml   Filed Weights   07/05/18 1104 07/05/18 1133 07/05/18 1502  Weight: 63.5 kg 63.5 kg 65.4 kg    Examination:  Constitutional: Mildly anxious Eyes: PERRL, lids and conjunctivae normal ENMT: Mucous membranes are dry Neck: normal, supple Respiratory: Distant breath sounds, no wheezing heard.  Shallow breathing, slightly tachypneic Cardiovascular: Regular rate and rhythm, no murmurs / rubs / gallops. No LE edema.  Tachycardic Abdomen: Mild tenderness throughout the present, no rebound or guarding Skin: no rashes, lesions, ulcers. No induration Neurologic: CN 2-12 grossly intact. Strength 5/5 in all 4.    Data Reviewed: I have independently reviewed following labs and imaging studies  CT scan-large left-sided loculated pleural effusion  CBC: Recent Labs  Lab 07/05/18 1156 07/06/18 0510  07/06/18 2131 07/07/18 0317  WBC 14.3* 16.0* 14.4* 12.4*  NEUTROABS 12.1*  --   --  10.9*  HGB 10.2* 8.4* 7.9* 7.3*  HCT 32.7* 27.0* 25.3* 23.5*  MCV 91.1 89.7 90.4 90.7  PLT 370 221 168 295   Basic Metabolic Panel: Recent Labs  Lab 07/05/18 1156 07/06/18 0510 07/06/18 2131 07/07/18 0317  NA 136 135 136 135  K 4.3  5.0 4.4 4.1  CL 101 105 107 107  CO2 18* 21* 19* 19*  GLUCOSE 158* 145* 128* 120*  BUN 15 25* 23 24*  CREATININE 1.50* 1.76* 1.71* 1.73*  CALCIUM 8.8* 7.3* 6.9* 6.7*   GFR: Estimated Creatinine Clearance: 36 mL/min (A) (by C-G formula based on SCr of 1.73 mg/dL (H)). Liver Function Tests: Recent Labs  Lab 07/05/18 1156 07/06/18 0510 07/06/18 2131  AST 57* 35 23  ALT 19 17 14   ALKPHOS 87 48 51  BILITOT 1.2 0.6 0.9  PROT 7.4 5.6* 4.9*  ALBUMIN 3.2* 2.3* 1.9*   Recent Labs  Lab 07/06/18 1723  LIPASE 44   No results for input(s): AMMONIA in the last 168 hours. Coagulation Profile: Recent Labs  Lab 07/05/18 1156 07/06/18 2131  INR 1.10 1.42   Cardiac Enzymes: No results for input(s): CKTOTAL, CKMB, CKMBINDEX, TROPONINI in the last 168 hours. BNP (last 3 results) No results for input(s): PROBNP in the last 8760 hours. HbA1C: No results for input(s): HGBA1C in the last 72 hours. CBG: No results for input(s): GLUCAP in the last 168 hours. Lipid Profile: No results for input(s): CHOL, HDL, LDLCALC, TRIG, CHOLHDL, LDLDIRECT in the last 72 hours. Thyroid Function Tests: No results for input(s): TSH, T4TOTAL, FREET4, T3FREE, THYROIDAB in the last 72 hours. Anemia Panel: No results for input(s): VITAMINB12, FOLATE, FERRITIN, TIBC, IRON, RETICCTPCT in the last 72 hours. Urine analysis:    Component Value Date/Time   COLORURINE AMBER (A) 07/06/2018 2048   APPEARANCEUR HAZY (A) 07/06/2018 2048   LABSPEC 1.021 07/06/2018 2048   PHURINE 5.0 07/06/2018 2048   GLUCOSEU NEGATIVE 07/06/2018 2048   HGBUR NEGATIVE 07/06/2018 2048   BILIRUBINUR NEGATIVE 07/06/2018 2048   KETONESUR NEGATIVE 07/06/2018 2048   PROTEINUR NEGATIVE 07/06/2018 2048   NITRITE NEGATIVE 07/06/2018 2048   LEUKOCYTESUR NEGATIVE 07/06/2018 2048   Sepsis Labs: Invalid input(s): PROCALCITONIN, LACTICIDVEN  Recent Results (from the past 240 hour(s))  Culture, blood (Routine x 2)     Status: None  (Preliminary result)   Collection Time: 07/05/18 11:56 AM  Result Value Ref Range Status   Specimen Description BLOOD LEFT ARM  Final   Special Requests   Final    BOTTLES DRAWN AEROBIC AND ANAEROBIC Blood Culture adequate volume   Culture   Final    NO GROWTH 2 DAYS Performed at [facility], [address], [place], [place] [address]    Report Status PENDING  Incomplete  Culture, blood (Routine x 2)     Status: None (Preliminary result)   Collection Time: 07/05/18 12:02 PM  Result Value Ref Range Status   Specimen Description BLOOD RIGHT HAND  Final   Special Requests   Final    BOTTLES DRAWN AEROBIC AND ANAEROBIC Blood Culture results may not be optimal due to an inadequate volume of blood received in culture bottles   Culture   Final    NO GROWTH 2 DAYS Performed at [facility], [address] [place], [name] [address]    Report Status PENDING  Incomplete  Gram stain  Status: None   Collection Time: 07/05/18  1:35 PM  Result Value Ref Range Status   Specimen Description PLEURAL  Final   Special Requests NONE  Final   Gram Stain   Final    NO ORGANISMS SEEN WBC PRESENT, PREDOMINANTLY PMN CYTOSPIN SMEAR Performed at [facility], [address], [place], [place] [address]    Report Status 07/05/2018 FINAL  Final  Culture, body fluid-bottle     Status: None (Preliminary result)   Collection Time: 07/05/18  1:35 PM  Result Value Ref Range Status   Specimen Description PLEURAL  Final   Special Requests BOTTLES DRAWN AEROBIC AND ANAEROBIC 10CC  Final   Culture   Final    NO GROWTH 2 DAYS Performed at [facility], [address] [place], [place] [address]    Report Status PENDING  Incomplete  Surgical PCR screen     Status: None   Collection Time: 07/07/18  8:42 AM  Result Value Ref Range Status   MRSA, PCR NEGATIVE NEGATIVE Final   Staphylococcus aureus NEGATIVE NEGATIVE Final    Comment: (NOTE) The Xpert SA Assay (FDA approved for NASAL specimens in patients  77 years of age and older), is one component of a comprehensive surveillance program. It is not intended to diagnose infection nor to guide or monitor treatment. Performed at [facility], [place] [address], [place],  [address]       Radiology Studies: Dg Chest 1 View  Result Date: 07/05/2018 CLINICAL DATA:  Post LEFT thoracentesis EXAM: CHEST  1 VIEW COMPARISON:  Earlier study 07/05/2018 FINDINGS: No pneumothorax following LEFT thoracentesis. Persistent LEFT pleural effusion and basilar atelectasis accentuated by expiratory technique. RIGHT lung clear. Bones demineralized. IMPRESSION: No pneumothorax following LEFT thoracentesis. Electronically Signed   By: Lavonia Dana M.D.   On: 07/05/2018 14:25   Ct Chest Wo Contrast  Result Date: 07/06/2018 CLINICAL DATA:  Shortness of breath, chest pain. History of prostate cancer. EXAM: CT CHEST WITHOUT CONTRAST TECHNIQUE: Multidetector CT imaging of the chest was performed following the standard protocol without IV contrast. COMPARISON:  Radiograph of same day. FINDINGS: Cardiovascular: No significant vascular findings. Normal heart size. No pericardial effusion. Mediastinum/Nodes: No enlarged mediastinal or axillary lymph nodes. Thyroid gland, trachea, and esophagus demonstrate no significant findings. Lungs/Pleura: No pneumothorax is noted. Large left pleural effusion is noted with adjacent subsegmental atelectasis of left upper lobe and complete atelectasis of left lower lobe. Mild right posterior basilar subsegmental atelectasis is noted. Upper Abdomen: Fatty infiltration of the liver is noted. There are multiple nodular densities above the left kidney which may be inflammatory in etiology. Possible loculated subdiaphragmatic fluid collection is noted on the left as well. Musculoskeletal: No chest wall mass or suspicious bone lesions identified. IMPRESSION: Large left pleural effusion is noted with associated subsegmental atelectasis of left  upper lobe and complete atelectasis of left lower lobe. Fatty infiltration of the liver. Multiple nodular densities are noted above the left kidney as well as possible loculated left subdiaphragmatic fluid collection. CT scan of the abdomen and pelvis with intravenous contrast is recommended for further evaluation. Electronically Signed   By: Marijo Conception, M.D.   On: 07/06/2018 14:18   Dg Chest Port 1 View  Result Date: 07/06/2018 CLINICAL DATA:  Dyspnea. History of left pleural effusion and left thoracentesis. EXAM: PORTABLE CHEST 1 VIEW COMPARISON:  Chest radiograph 07/05/2018 FINDINGS: Increased densities throughout the left hemithorax suggests increased left pleural fluid. Left pleural effusion appears to be  large for size. Limited evaluation of the heart and mediastinum due to the left chest densities. Cannot exclude some mediastinal shift towards the right. No focal disease in the right lung. IMPRESSION: Markedly increased densities throughout the left hemithorax. Findings are suggestive for a large left pleural effusion. Electronically Signed   By: Markus Daft M.D.   On: 07/06/2018 13:17   Dg Chest Port 1 View  Result Date: 07/05/2018 CLINICAL DATA:  Shortness of breath. EXAM: PORTABLE CHEST 1 VIEW COMPARISON:  Chest x-ray dated June 24, 2018. FINDINGS: The left heart border is obscured. Interval enlargement of the left pleural effusion with likely complete collapse of the left lower lobe and partial collapse of the lingula. The right lung is clear. No pneumothorax. No acute osseous abnormality. IMPRESSION: 1. Worsening moderate to large left pleural effusion with left lower lobe and lingular collapse. Electronically Signed   By: Titus Dubin M.D.   On: 07/05/2018 11:38   US Thoracentesis Asp Pleural Space W/img Guide  Result Date: 07/05/2018 INDICATION: LEFT pleural effusion question empyema EXAM: ULTRASOUND GUIDED DIAGNOSTIC AND THERAPEUTIC LEFT THORACENTESIS MEDICATIONS: None.  COMPLICATIONS: None immediate. PROCEDURE: Procedure, benefits, and risks of procedure were discussed with patient. Written informed consent for procedure was obtained. Time out protocol followed. Pleural effusion localized by ultrasound at the posterior LEFT hemithorax. Skin prepped and draped in usual sterile fashion. Skin and soft tissues anesthetized with 10 mL of 1% lidocaine. 8 French thoracentesis catheter placed into the LEFT pleural space. 500 mL of cloudy tan-colored fluid aspirated from the LEFT pleural space by syringe and vacuum bottle. Procedure terminated prematurely due to patient discomfort and wanting to lie down. Procedure tolerated well by patient without immediate complication. FINDINGS: A total of approximately 500 mL of LEFT pleural fluid was removed. Samples were sent to the laboratory as requested by the clinical team. IMPRESSION: Successful ultrasound guided LEFT thoracentesis yielding 500 mL of pleural fluid. Electronically Signed   By: Lavonia Dana M.D.   On: 07/05/2018 13:56     Scheduled Meds: . sodium chloride   Intravenous Once  . [MAR Hold] B-complex with vitamin C  1 tablet Oral q morning - 10a  . bupivacaine liposome  10 mL Infiltration Once  . [MAR Hold] folic acid  1 mg Oral Daily  . [MAR Hold] LORazepam  0-4 mg Intravenous Q6H   Followed by  . [MAR Hold] LORazepam  0-4 mg Intravenous Q12H  . [MAR Hold] multivitamin with minerals  1 tablet Oral Daily  . [MAR Hold] mupirocin ointment  1 application Nasal BID  . [MAR Hold] pantoprazole  40 mg Oral Daily  . [MAR Hold] thiamine  100 mg Oral Daily   Or  . [MAR Hold] thiamine  100 mg Intravenous Daily   Continuous Infusions: . sodium chloride 100 mL/hr at 07/06/18 1246  . lactated ringers 50 mL/hr at 07/07/18 0544  . [MAR Hold] piperacillin-tazobactam (ZOSYN)  IV 3.375 g (07/07/18 0410)    Marzetta Board, MD, PhD [facility] Pager [phone]  If 7PM-7AM, please contact  night-coverage [email or web address] Password [facility] 07/07/2018, 10:59 AM

## 2018-07-07 NOTE — Op Note (Signed)
NAME: Clifford [name], Clifford [name] MEDICAL RECORD VO:[id number] ACCOUNT [id number] DATE OF BIRTH:Dec 06, 1950 FACILITY: [facility] LOCATION: [facility] PHYSICIAN:Takaya Hyslop Chaya Jan, MD  OPERATIVE REPORT  DATE OF PROCEDURE:  07/07/2018  PREOPERATIVE DIAGNOSIS:  Left empyema.  POSTOPERATIVE DIAGNOSIS:  Early organizing left empyema.  PROCEDURES:   Left video-assisted thoracoscopy, Drainage of empyema and  Decortication of visceral and parietal pleura.  SURGEON:  Modesto Charon, MD  ASSISTANT:  Ellwood Handler, PA-C  ANESTHESIA:  General.  FINDINGS:  Approximately 1 liter of murky fluid, not frankly purulent. Some loculated collections of fluid.  Early peel on the visceral and parietal pleura.  CLINICAL NOTE:  The patient is a 67 year old gentleman who was admitted with left-sided chest pain and shortness of breath.  Workup showed a large left pleural effusion.  Thoracentesis revealed murky fluid with a markedly elevated white count consistent  with an empyema.  He was transferred to [facility].  He was advised to undergo left VATS for drainage of the empyema and decortication.  The indications, risks, benefits, and alternatives were discussed in detail with the patient.  He understood and  accepted the risks and agreed to proceed.  DESCRIPTION OF PROCEDURE:  The patient was brought to the preoperative holding area on 07/07/2018.  Anesthesia placed an arterial blood pressure monitoring line and central venous catheter.  He was taken to the operating room, anesthetized and intubated  with a double lumen endotracheal tube.  He was already receiving Zosyn intravenously.  He was given a dose of vancomycin as well.  Sequential compression devices were placed on the calves for DVT prophylaxis.  A Foley catheter was placed.  He was placed  in a right lateral decubitus position and the left chest was prepped and draped in the usual sterile fashion.  Single lung ventilation of the right lung was initiated and  was tolerated well.  A timeout was performed confirming the operative site.  A port incision was made in the seventh interspace in the midaxillary line.  It was carried through the skin and subcutaneous tissue.  The chest was entered bluntly using a hemostat.  Suction was  placed into the chest and dark yellow murky fluid was evacuated.  This was sent for AFB, fungal, aerobic and anaerobic cultures and stains.  Approximately a liter of fluid was drained in all.  A 7 cm working incision was made in the 5th interspace  anterolaterally.  No rib spreading was performed during the procedure.  A second port incision was made anterior to the first one for instrumentation and placement of a chest tube.  There was an early empyema noted.  There were some areas of loculation but  these were broken up rather easily.  There was an early fibrinous peel on both the visceral and parietal pleura.  The visceral peel was removed first.  The peel came off the lung without any pleural tears.  The entire lung was decorticated.  Inflation of  the lung after decortication showed good expansion.  The chest was then copiously irrigated with warm saline.  The parietal pleural peel then was removed.  There was some minor bleeding, but again this was a relatively early peel that came off easily.   The chest was again copiously irrigated with warm saline.  A 28 [place] Blake drain was placed in the original port incision and directed posteriorly.  A 28 French chest tube was placed through the more anterior port incision and directed anteriorly.   Both were secured to skin with #1  silk sutures.  The left lung was reinflated.  The working incision was closed in standard fashion in 3 layers.  The patient then was placed back in the supine position.  The chest tubes were placed to suction.    He was extubated in the operating room and taken to the [place] [facility] in good condition.  AN/NUANCE  D:07/07/2018 T:07/07/2018  JOB:[id number]

## 2018-07-07 NOTE — Interval H&P Note (Signed)
History and Physical Interval Note:  07/07/2018 10:40 AM  Clifford [name]  has presented today for surgery, with the diagnosis of EMPYEMA  The various methods of treatment have been discussed with the patient and family. After consideration of risks, benefits and other options for treatment, the patient has consented to  Procedure(s): VIDEO ASSISTED THORACOSCOPY (VATS)/DRAINAGE OF EMPYEMA, DECORTICATION (Left) as a surgical intervention .  The patient's history has been reviewed, patient examined, no change in status, stable for surgery.  I have reviewed the patient's chart and labs.  Questions were answered to the patient's satisfaction.     [name]

## 2018-07-07 NOTE — Anesthesia Postprocedure Evaluation (Signed)
Anesthesia Post Note  Patient: Clifford [name]  Procedure(s) Performed: VIDEO ASSISTED THORACOSCOPY (VATS)/DRAINAGE OF EMPYEMA, DECORTICATION (Left Chest)     Patient location during evaluation: PACU Anesthesia Type: General Level of consciousness: awake and alert Pain management: pain level controlled Vital Signs Assessment: post-procedure vital signs reviewed and stable Respiratory status: spontaneous breathing, nonlabored ventilation, respiratory function stable and patient connected to nasal cannula oxygen Cardiovascular status: blood pressure returned to baseline and stable Postop Assessment: no apparent nausea or vomiting Anesthetic complications: no    Last Vitals:  Vitals:   07/07/18 1353 07/07/18 1404  BP: 114/64   Pulse: (!) 107 (!) 106  Resp: 20 17  Temp:    SpO2: 100% 100%    Last Pain:  Vitals:   07/07/18 0800  TempSrc:   PainSc: 0-No pain                 Jhoana Upham DAVID

## 2018-07-07 NOTE — Anesthesia Procedure Notes (Signed)
Arterial Line Insertion Start/End8/25/2019 10:30 AM, 07/07/2018 10:40 AM Performed by: Shirlyn Goltz, CRNA, CRNA  Patient location: Pre-op. Preanesthetic checklist: patient identified, IV checked, site marked, risks and benefits discussed, surgical consent, monitors and equipment checked, pre-op evaluation and anesthesia consent Lidocaine 1% used for infiltration and patient sedated Right, radial was placed Catheter size: 18 G Hand hygiene performed , maximum sterile barriers used  and Seldinger technique used Allen's test indicative of satisfactory collateral circulation Attempts: 1 Procedure performed without using ultrasound guided technique. Following insertion, dressing applied and Biopatch. Post procedure assessment: normal

## 2018-07-07 NOTE — Progress Notes (Signed)
Attempt to call report - pt placed on hold - nurse unavailable - Dr Conrad Wales in to see pt at bedside - updated

## 2018-07-07 NOTE — Progress Notes (Signed)
Pt placed on portable monitor at 1455 for transfer to [facility] - remains on portable monitor - RN awaiting transfer assistance. Lab called about stat lactic acid level ordered preop- C. Helaina Stefano requested lab follow up w/ 2C RN (chrissy) regarding lab draw

## 2018-07-07 NOTE — Anesthesia Procedure Notes (Signed)
Procedure Name: Intubation Date/Time: 07/07/2018 11:39 AM Performed by: Shirlyn Goltz, CRNA Pre-anesthesia Checklist: Patient identified, Emergency Drugs available, Suction available and Patient being monitored Patient Re-evaluated:Patient Re-evaluated prior to induction Oxygen Delivery Method: Circle system utilized Preoxygenation: Pre-oxygenation with 100% oxygen Induction Type: IV induction Ventilation: Mask ventilation without difficulty Laryngoscope Size: Mac and 4 Grade View: Grade II Endobronchial tube: Left, Double lumen EBT, EBT position confirmed by auscultation and EBT position confirmed by fiberoptic bronchoscope and 37 Fr Number of attempts: 2 Airway Equipment and Method: Stylet Placement Confirmation: ETT inserted through vocal cords under direct vision,  positive ETCO2 and breath sounds checked- equal and bilateral Tube secured with: Tape Dental Injury: Teeth and Oropharynx as per pre-operative assessment

## 2018-07-07 NOTE — Progress Notes (Signed)
Patient interviewed in the preop area. Family, CRNA and preop nurse at bedside. Patient able to confirm name, DOB, procedure, correct laterality, npo status and 3/10 left chest pain when coughing.   Leatha Gilding, RN

## 2018-07-07 NOTE — Transfer of Care (Signed)
Immediate Anesthesia Transfer of Care Note  Patient: Clifford [name]  Procedure(s) Performed: VIDEO ASSISTED THORACOSCOPY (VATS)/DRAINAGE OF EMPYEMA, DECORTICATION (Left Chest)  Patient Location: PACU  Anesthesia Type:General  Level of Consciousness: drowsy and patient cooperative  Airway & Oxygen Therapy: Patient Spontanous Breathing and Patient connected to face mask oxygen  Post-op Assessment: Report given to RN and Post -op Vital signs reviewed and stable  Post vital signs: Reviewed and stable  Last Vitals:  Vitals Value Taken Time  BP 158/115 07/07/2018  1:07 PM  Temp    Pulse 100 07/07/2018  1:09 PM  Resp 36 07/07/2018  1:09 PM  SpO2 100 % 07/07/2018  1:09 PM  Vitals shown include unvalidated device data.  Last Pain:  Vitals:   07/07/18 0800  TempSrc:   PainSc: 0-No pain         Complications: No apparent anesthesia complications

## 2018-07-07 NOTE — Anesthesia Procedure Notes (Signed)
Central Venous Catheter Insertion Performed by: Lillia Abed, MD, anesthesiologist Start/End8/25/2019 10:12 AM, 07/07/2018 10:22 AM Patient location: Pre-op. Preanesthetic checklist: patient identified, IV checked, risks and benefits discussed, surgical consent, monitors and equipment checked, pre-op evaluation, timeout performed and anesthesia consent Lidocaine 1% used for infiltration and patient sedated Hand hygiene performed  and maximum sterile barriers used  Catheter size: 8 Fr Total catheter length 16. Central line was placed.Double lumen Procedure performed using ultrasound guided technique. Ultrasound Notes:anatomy identified, needle tip was noted to be adjacent to the nerve/plexus identified, no ultrasound evidence of intravascular and/or intraneural injection and image(s) printed for medical record Attempts: 1 Following insertion, dressing applied, line sutured and Biopatch. Post procedure assessment: blood return through all ports, free fluid flow and no air  Patient tolerated the procedure well with no immediate complications.

## 2018-07-08 ENCOUNTER — Ambulatory Visit (INDEPENDENT_AMBULATORY_CARE_PROVIDER_SITE_OTHER): Payer: Medicare Other | Admitting: Internal Medicine

## 2018-07-08 ENCOUNTER — Inpatient Hospital Stay (HOSPITAL_COMMUNITY): Payer: Medicare Other

## 2018-07-08 ENCOUNTER — Encounter (HOSPITAL_COMMUNITY): Payer: Self-pay | Admitting: Thoracic Surgery (Cardiothoracic Vascular Surgery)

## 2018-07-08 DIAGNOSIS — E785 Hyperlipidemia, unspecified: Secondary | ICD-10-CM

## 2018-07-08 LAB — PREPARE RBC (CROSSMATCH)

## 2018-07-08 LAB — CBC
HCT: 23.6 % — ABNORMAL LOW (ref 39.0–52.0)
HEMOGLOBIN: 7.2 g/dL — AB (ref 13.0–17.0)
MCH: 27.5 pg (ref 26.0–34.0)
MCHC: 30.5 g/dL (ref 30.0–36.0)
MCV: 90.1 fL (ref 78.0–100.0)
Platelets: 170 10*3/uL (ref 150–400)
RBC: 2.62 MIL/uL — AB (ref 4.22–5.81)
RDW: 15.7 % — ABNORMAL HIGH (ref 11.5–15.5)
WBC: 13.2 10*3/uL — ABNORMAL HIGH (ref 4.0–10.5)

## 2018-07-08 LAB — BLOOD GAS, ARTERIAL
Acid-base deficit: 3.7 mmol/L — ABNORMAL HIGH (ref 0.0–2.0)
BICARBONATE: 19.7 mmol/L — AB (ref 20.0–28.0)
FIO2: 21
O2 Saturation: 92.1 %
PCO2 ART: 29.2 mmHg — AB (ref 32.0–48.0)
PO2 ART: 61.1 mmHg — AB (ref 83.0–108.0)
Patient temperature: 98.6
pH, Arterial: 7.444 (ref 7.350–7.450)

## 2018-07-08 LAB — COMPREHENSIVE METABOLIC PANEL
ALBUMIN: 1.5 g/dL — AB (ref 3.5–5.0)
ALK PHOS: 55 U/L (ref 38–126)
ALT: 11 U/L (ref 0–44)
ANION GAP: 7 (ref 5–15)
AST: 18 U/L (ref 15–41)
BUN: 19 mg/dL (ref 8–23)
CALCIUM: 6.5 mg/dL — AB (ref 8.9–10.3)
CO2: 19 mmol/L — ABNORMAL LOW (ref 22–32)
Chloride: 107 mmol/L (ref 98–111)
Creatinine, Ser: 1.42 mg/dL — ABNORMAL HIGH (ref 0.61–1.24)
GFR calc Af Amer: 58 mL/min — ABNORMAL LOW (ref >60)
GFR calc non Af Amer: 50 mL/min — ABNORMAL LOW (ref >60)
Glucose, Bld: 159 mg/dL — ABNORMAL HIGH (ref 70–99)
Potassium: 3.8 mmol/L (ref 3.5–5.1)
Sodium: 133 mmol/L — ABNORMAL LOW (ref 135–145)
Total Bilirubin: 0.6 mg/dL (ref 0.3–1.2)
Total Protein: 4.3 g/dL — ABNORMAL LOW (ref 6.5–8.1)

## 2018-07-08 LAB — GLUCOSE, CAPILLARY
GLUCOSE-CAPILLARY: 119 mg/dL — AB (ref 70–99)
Glucose-Capillary: 136 mg/dL — ABNORMAL HIGH (ref 70–99)
Glucose-Capillary: 120 mg/dL — ABNORMAL HIGH (ref 70–99)
Glucose-Capillary: 108 mg/dL — ABNORMAL HIGH (ref 70–99)

## 2018-07-08 LAB — PHOSPHORUS: Phosphorus: 3.2 mg/dL (ref 2.5–4.6)

## 2018-07-08 LAB — MAGNESIUM: Magnesium: 1.2 mg/dL — ABNORMAL LOW (ref 1.7–2.4)

## 2018-07-08 MED ORDER — LEVALBUTEROL HCL 0.63 MG/3ML IN NEBU
0.6300 mg | INHALATION_SOLUTION | Freq: Two times a day (BID) | RESPIRATORY_TRACT | Status: DC
  Administered 2018-07-09 (×2): 0.63 mg via RESPIRATORY_TRACT
  Filled 2018-07-08 (×2): qty 3

## 2018-07-08 MED ORDER — ENOXAPARIN SODIUM 40 MG/0.4ML ~~LOC~~ SOLN
40.0000 mg | SUBCUTANEOUS | Status: DC
  Administered 2018-07-08 – 2018-07-12 (×5): 40 mg via SUBCUTANEOUS
  Filled 2018-07-08 (×5): qty 0.4

## 2018-07-08 MED ORDER — SODIUM CHLORIDE 0.9% IV SOLUTION: Freq: Once | INTRAVENOUS | Status: DC

## 2018-07-08 NOTE — Progress Notes (Signed)
1 Day Post-Op Procedure(s) (LRB): VIDEO ASSISTED THORACOSCOPY (VATS)/DRAINAGE OF EMPYEMA, DECORTICATION (Left) Subjective: Still has some left chest pain  Objective: Vital signs in last 24 hours: Temp:  [97.3 F (36.3 C)-102.9 F (39.4 C)] 101.6 F (38.7 C) (08/26 0736) Pulse Rate:  [91-116] 109 (08/26 0736) Cardiac Rhythm: Sinus tachycardia (08/26 0740) Resp:  [14-32] 27 (08/26 0736) BP: (87-123)/(54-71) 107/67 (08/26 0736) SpO2:  [91 %-100 %] 97 % (08/26 0736) Arterial Line BP: (114-185)/(50-76) 161/62 (08/26 0400) FiO2 (%):  [21 %] 21 % (08/26 0731)  Hemodynamic parameters for last 24 hours:    Intake/Output from previous day: 08/25 0701 - 08/26 0700 In: 2329 [I.V.:2029; IV Piggyback:200] Out: 795 [Urine:475; Blood:50; Chest Tube:270] Intake/Output this shift: No intake/output data recorded.  General appearance: alert, cooperative and no distress Neurologic: intact Heart: tachy, regular Lungs: faint rhonchi on right no air leak, blood tinged serous drainage form CT  Lab Results: Recent Labs    07/07/18 0317 07/08/18 0359  WBC 12.4* 13.2*  HGB 7.3* 7.2*  HCT 23.5* 23.6*  PLT 159 170   BMET:  Recent Labs    07/07/18 0317 07/08/18 0359  NA 135 133*  K 4.1 3.8  CL 107 107  CO2 19* 19*  GLUCOSE 120* 159*  BUN 24* 19  CREATININE 1.73* 1.42*  CALCIUM 6.7* 6.5*    PT/INR:  Recent Labs    07/06/18 2131  LABPROT 17.2*  INR 1.42   ABG    Component Value Date/Time   PHART 7.444 07/08/2018 0355   HCO3 19.7 (L) 07/08/2018 0355   ACIDBASEDEF 3.7 (H) 07/08/2018 0355   O2SAT 92.1 07/08/2018 0355   CBG (last 3)  Recent Labs    07/07/18 1727 07/07/18 2114 07/08/18 0740  GLUCAP 119* 159* 136*    Assessment/Plan: S/P Procedure(s) (LRB): VIDEO ASSISTED THORACOSCOPY (VATS)/DRAINAGE OF EMPYEMA, DECORTICATION (Left) - Pneumonia with empyema. Continue antibiotics with Zosyn pending culture results Empyema drained yesterday. GS showed WBC but no  organisms.  Keep CT to suction today Will have speech do swallowing eval prior to starting a diet Ambulate SCD + enoxaparin for DVT prophylaxis Acute kidney injury with Cr. 1.7 on admission. Now 1.42- follow  LOS: 3 days    [name] 07/08/2018

## 2018-07-08 NOTE — Progress Notes (Signed)
PROGRESS NOTE  Clifford [name] HUT:[id number] DOB: 1950-11-29 DOA: 07/13/18 PCP: [name], FNP   LOS: 3 days   Brief Narrative / Interim history: Mr. Barthelemy is transferred from [facility] where he presented yesterday with left sided chest pain, cough, fevers, found to have a pleural effusion and concerns for aspiration pneumonia. He was placed on Unasyn and underwent a thoracentesis. Fluid showed evidence of empyema with WBC > 100,000 with 96% neutrophils.  Thoracic surgery was consulted with plans for VATS 8/25  Assessment & Plan: Principal Problem:   Pleural effusion, left Active Problems:   Alcoholism ([place])   Seizures ([place])   Hyperlipidemia   Essential hypertension   Malignant neoplasm of prostate ([place])   Aspiration pneumonia of both lower lobes due to gastric secretions ([facility])   Sepsis ([place])   Sepsis due to aspiration pneumonia complicated by hypoxic respiratory failure and left loculated empyema -CT scan just prior to transfer shows large left pleural effusion -Thoracic surgery consulted and he is status post VATS on 8/25 -Continue Zosyn, cultures are negative so far  ETOH abuse -patient drinks 1.5L bottle of gin daily. Monitor CIWA -He is not significantly triggering CIWA  AKI -due to sepsis, creatinine improved this morning with fluids  Anemia -Hemoglobin slightly worse likely dilutional, probably has a component of anemia in the setting of sepsis and potentially bone marrow suppression from long-standing alcohol abuse along with acute blood loss anemia postop -Transfuse 2 units of packed red blood cells today  HTN -hold home meds   DVT prophylaxis: SCDs Code Status: Full code Family Communication: No family present at bedside this morning Disposition Plan: To be determined  Consultants:   Cardiothoracic surgery  Procedures:   VATS 8/25  Antimicrobials:  Unasyn 8/22-8/24  Zosyn 8/24-  Subjective: -Complains of left chest  soreness, no deep breathing difficulties, no abdominal pain, nausea or vomiting  Objective: Vitals:   07/08/18 0736 07/08/18 0945 07/08/18 1012 07/08/18 1017  BP: 107/67 127/66  102/77  Pulse: (!) 109     Resp: (!) 27  (!) 25   Temp: (!) 101.6 F (38.7 C) (!) 101.3 F (38.5 C)  (!) 100.8 F (38.2 C)  TempSrc: Axillary Oral  Axillary  SpO2: 97%  96%   Weight:      Height:        Intake/Output Summary (Last 24 hours) at 07/08/2018 1109 Last data filed at 07/08/2018 0400 Gross per 24 hour  Intake 1929.16 ml  Output 795 ml  Net 1134.16 ml   Filed Weights   07/13/18 1104 2018/07/13 1133 07-13-2018 1502  Weight: 63.5 kg 63.5 kg 65.4 kg    Examination:  Constitutional: Is in no distress Eyes: No scleral icterus ENMT: Moist mucous membranes Respiratory: Moves air well bilaterally, no wheezing.  Shallow breathing. Cardiovascular: Regular rate and rhythm, no murmurs heard.  No peripheral edema.  Slightly tachycardic Abdomen: Mild tenderness, no rebound or guarding, bowel sounds positive Skin: no rashes seen Neurologic: Nonfocal, equal strength   Data Reviewed: I have independently reviewed following labs and imaging studies    CBC: Recent Labs  Lab 13-Jul-2018 1156 07/06/18 0510 07/06/18 2131 07/07/18 0317 07/08/18 0359  WBC 14.3* 16.0* 14.4* 12.4* 13.2*  NEUTROABS 12.1*  --   --  10.9*  --   HGB 10.2* 8.4* 7.9* 7.3* 7.2*  HCT 32.7* 27.0* 25.3* 23.5* 23.6*  MCV 91.1 89.7 90.4 90.7 90.1  PLT 370 221 168 159 656   Basic Metabolic Panel: Recent Labs  Lab 07/05/18 1156 07/06/18 0510 07/06/18 2131 07/07/18 0317 07/08/18 0359  NA 136 135 136 135 133*  K 4.3 5.0 4.4 4.1 3.8  CL 101 105 107 107 107  CO2 18* 21* 19* 19* 19*  GLUCOSE 158* 145* 128* 120* 159*  BUN 15 25* 23 24* 19  CREATININE 1.50* 1.76* 1.71* 1.73* 1.42*  CALCIUM 8.8* 7.3* 6.9* 6.7* 6.5*  MG  --   --   --   --  1.2*  PHOS  --   --   --   --  3.2   GFR: Estimated Creatinine Clearance: 43.9 mL/min (A)  (by C-G formula based on SCr of 1.42 mg/dL (H)). Liver Function Tests: Recent Labs  Lab 07/05/18 1156 07/06/18 0510 07/06/18 2131 07/08/18 0359  AST 57* 35 23 18  ALT 19 17 14 11   ALKPHOS 87 48 51 55  BILITOT 1.2 0.6 0.9 0.6  PROT 7.4 5.6* 4.9* 4.3*  ALBUMIN 3.2* 2.3* 1.9* 1.5*   Recent Labs  Lab 07/06/18 1723  LIPASE 44   No results for input(s): AMMONIA in the last 168 hours. Coagulation Profile: Recent Labs  Lab 07/05/18 1156 07/06/18 2131  INR 1.10 1.42   Cardiac Enzymes: No results for input(s): CKTOTAL, CKMB, CKMBINDEX, TROPONINI in the last 168 hours. BNP (last 3 results) No results for input(s): PROBNP in the last 8760 hours. HbA1C: No results for input(s): HGBA1C in the last 72 hours. CBG: Recent Labs  Lab 07/07/18 1727 07/07/18 2114 07/08/18 0740  GLUCAP 119* 159* 136*   Lipid Profile: No results for input(s): CHOL, HDL, LDLCALC, TRIG, CHOLHDL, LDLDIRECT in the last 72 hours. Thyroid Function Tests: No results for input(s): TSH, T4TOTAL, FREET4, T3FREE, THYROIDAB in the last 72 hours. Anemia Panel: No results for input(s): VITAMINB12, FOLATE, FERRITIN, TIBC, IRON, RETICCTPCT in the last 72 hours. Urine analysis:    Component Value Date/Time   COLORURINE AMBER (A) 07/06/2018 2048   APPEARANCEUR HAZY (A) 07/06/2018 2048   LABSPEC 1.021 07/06/2018 2048   PHURINE 5.0 07/06/2018 2048   GLUCOSEU NEGATIVE 07/06/2018 2048   HGBUR NEGATIVE 07/06/2018 2048   BILIRUBINUR NEGATIVE 07/06/2018 2048   KETONESUR NEGATIVE 07/06/2018 2048   PROTEINUR NEGATIVE 07/06/2018 2048   NITRITE NEGATIVE 07/06/2018 2048   LEUKOCYTESUR NEGATIVE 07/06/2018 2048   Sepsis Labs: Invalid input(s): PROCALCITONIN, LACTICIDVEN  Recent Results (from the past 240 hour(s))  Culture, blood (Routine x 2)     Status: None (Preliminary result)   Collection Time: 07/05/18 11:56 AM  Result Value Ref Range Status   Specimen Description BLOOD LEFT ARM  Final   Special Requests   Final     BOTTLES DRAWN AEROBIC AND ANAEROBIC Blood Culture adequate volume   Culture   Final    NO GROWTH 3 DAYS Performed at [facility], [address], [place], [place] [address]    Report Status PENDING  Incomplete  Culture, blood (Routine x 2)     Status: None (Preliminary result)   Collection Time: 07/05/18 12:02 PM  Result Value Ref Range Status   Specimen Description BLOOD RIGHT HAND  Final   Special Requests   Final    BOTTLES DRAWN AEROBIC AND ANAEROBIC Blood Culture results may not be optimal due to an inadequate volume of blood received in culture bottles   Culture   Final    NO GROWTH 3 DAYS Performed at [facility], [address], [place], [place] [address]    Report Status PENDING  Incomplete  Gram stain  Status: None   Collection Time: 07/05/18  1:35 PM  Result Value Ref Range Status   Specimen Description PLEURAL  Final   Special Requests NONE  Final   Gram Stain   Final    NO ORGANISMS SEEN WBC PRESENT, PREDOMINANTLY PMN CYTOSPIN SMEAR Performed at [facility] [address], [place], [place] [address]    Report Status 07/05/2018 FINAL  Final  Culture, body fluid-bottle     Status: None (Preliminary result)   Collection Time: 07/05/18  1:35 PM  Result Value Ref Range Status   Specimen Description PLEURAL  Final   Special Requests BOTTLES DRAWN AEROBIC AND ANAEROBIC 10CC  Final   Culture   Final    NO GROWTH 3 DAYS Performed at [facility], [address] [place], [place] [address]    Report Status PENDING  Incomplete  Surgical PCR screen     Status: None   Collection Time: 07/07/18  8:42 AM  Result Value Ref Range Status   MRSA, PCR NEGATIVE NEGATIVE Final   Staphylococcus aureus NEGATIVE NEGATIVE Final    Comment: (NOTE) The Xpert SA Assay (FDA approved for NASAL specimens in patients 37 years of age and older), is one component of a comprehensive surveillance program. It is not intended to diagnose infection nor to guide or monitor  treatment. Performed at [facility], [place] [address] [place], [place] [address]   Body fluid culture     Status: None (Preliminary result)   Collection Time: 07/07/18 10:37 AM  Result Value Ref Range Status   Specimen Description PLEURAL LEFT  Final   Special Requests NONE  Final   Gram Stain   Final    MODERATE WBC PRESENT, PREDOMINANTLY PMN NO ORGANISMS SEEN    Culture   Final    NO GROWTH < 24 HOURS Performed at [facility] [address], [place], [place] [address]    Report Status PENDING  Incomplete  Aerobic/Anaerobic Culture (surgical/deep wound)     Status: None (Preliminary result)   Collection Time: 07/07/18 12:08 PM  Result Value Ref Range Status   Specimen Description TISSUE PLEURAL  Final   Special Requests NONE  Final   Gram Stain   Final    MODERATE WBC PRESENT, PREDOMINANTLY PMN NO ORGANISMS SEEN Performed at [facility], [place] [address], [place], [place] [address]    Culture PENDING  Incomplete   Report Status PENDING  Incomplete      Radiology Studies: Ct Chest Wo Contrast  Result Date: 07/06/2018 CLINICAL DATA:  Shortness of breath, chest pain. History of prostate cancer. EXAM: CT CHEST WITHOUT CONTRAST TECHNIQUE: Multidetector CT imaging of the chest was performed following the standard protocol without IV contrast. COMPARISON:  Radiograph of same day. FINDINGS: Cardiovascular: No significant vascular findings. Normal heart size. No pericardial effusion. Mediastinum/Nodes: No enlarged mediastinal or axillary lymph nodes. Thyroid gland, trachea, and esophagus demonstrate no significant findings. Lungs/Pleura: No pneumothorax is noted. Large left pleural effusion is noted with adjacent subsegmental atelectasis of left upper lobe and complete atelectasis of left lower lobe. Mild right posterior basilar subsegmental atelectasis is noted. Upper Abdomen: Fatty infiltration of the liver is noted. There are multiple nodular densities above the left  kidney which may be inflammatory in etiology. Possible loculated subdiaphragmatic fluid collection is noted on the left as well. Musculoskeletal: No chest wall mass or suspicious bone lesions identified. IMPRESSION: Large left pleural effusion is noted with associated subsegmental atelectasis of left upper  lobe and complete atelectasis of left lower lobe. Fatty infiltration of the liver. Multiple nodular densities are noted above the left kidney as well as possible loculated left subdiaphragmatic fluid collection. CT scan of the abdomen and pelvis with intravenous contrast is recommended for further evaluation. Electronically Signed   By: Marijo Conception, M.D.   On: 07/06/2018 14:18   Dg Chest Port 1 View  Result Date: 07/08/2018 CLINICAL DATA:  Empyema of pleural space ,,chest tube present EXAM: PORTABLE CHEST - 1 VIEW COMPARISON:  the previous day's study FINDINGS: Stable left chest tubes, directed towards the apex. No pneumothorax. Residual pleural thickening or layering effusion. Stable atelectasis/consolidation in the mid and lower left lung. Right lung remains clear. Stable right IJ central line.  Heart size within normal limits. Visualized bones unremarkable. IMPRESSION: Stable appearance since previous day's exam Electronically Signed   By: Lucrezia Europe M.D.   On: 07/08/2018 08:07   Dg Chest Port 1 View  Result Date: 07/07/2018 CLINICAL DATA:  Empyema. Postoperative. EXAM: PORTABLE CHEST 1 VIEW COMPARISON:  Chest radiograph from one day prior. FINDINGS: Two left apical chest tubes are in place. Right internal jugular central venous catheter terminates at the cavoatrial junction. Stable cardiomediastinal silhouette with top-normal heart size. No pneumothorax. No right pleural effusion. Residual mild peripheral left pleural thickening without significant residual left pleural effusion. Improved aeration of the left lung with persistent patchy opacity throughout the left lung. Clear right lung.  IMPRESSION: 1. No pneumothorax. 2. Residual mild peripheral left pleural thickening without significant residual left pleural effusion. 3. Improved aeration of the left lung with persistent patchy opacity throughout the left lung. Electronically Signed   By: Ilona Sorrel M.D.   On: 07/07/2018 14:02   Dg Chest Port 1 View  Result Date: 07/06/2018 CLINICAL DATA:  Dyspnea. History of left pleural effusion and left thoracentesis. EXAM: PORTABLE CHEST 1 VIEW COMPARISON:  Chest radiograph 07/05/2018 FINDINGS: Increased densities throughout the left hemithorax suggests increased left pleural fluid. Left pleural effusion appears to be large for size. Limited evaluation of the heart and mediastinum due to the left chest densities. Cannot exclude some mediastinal shift towards the right. No focal disease in the right lung. IMPRESSION: Markedly increased densities throughout the left hemithorax. Findings are suggestive for a large left pleural effusion. Electronically Signed   By: Markus Daft M.D.   On: 07/06/2018 13:17     Scheduled Meds: . sodium chloride   Intravenous Once  . B-complex with vitamin C  1 tablet Oral q morning - 10a  . bisacodyl  10 mg Oral Daily  . enoxaparin (LOVENOX) injection  40 mg Subcutaneous Q24H  . fentaNYL   Intravenous P2Z  . folic acid  1 mg Oral Daily  . insulin aspart  0-24 Units Subcutaneous TID AC & HS  . levalbuterol  0.63 mg Nebulization Q6H  . LORazepam  0-4 mg Intravenous Q12H  . metoCLOPramide (REGLAN) injection  10 mg Intravenous Q6H  . multivitamin with minerals  1 tablet Oral Daily  . mupirocin ointment  1 application Nasal BID  . pantoprazole  40 mg Oral Daily  . senna-docusate  1 tablet Oral QHS  . thiamine  100 mg Oral Daily   Or  . thiamine  100 mg Intravenous Daily   Continuous Infusions: . sodium chloride 10 mL/hr at 07/08/18 0300  . dextrose 5 % and 0.45% NaCl 125 mL/hr at 07/08/18 0300  . piperacillin-tazobactam (ZOSYN)  IV 3.375 g (07/08/18 0503)   .  potassium chloride      Marzetta Board, MD, PhD [facility] Pager [phone]  If 7PM-7AM, please contact night-coverage [email or web address] Password [facility] 07/08/2018, 11:09 AM

## 2018-07-08 NOTE — Evaluation (Signed)
Clinical/Bedside Swallow Evaluation Patient Details  Name: Clifford [name] MRN: [id number] Date of Birth: 1951-06-14  Today's Date: 07/08/2018 Time: SLP Start Time (ACUTE ONLY): 0177 SLP Stop Time (ACUTE ONLY): 0923 SLP Time Calculation (min) (ACUTE ONLY): 32 min  Past Medical History:  Past Medical History:  Diagnosis Date  . Alcoholism ([place])   . Anemia   . High cholesterol   . Hypertension   . Prostate cancer ([place])   . Seizures ([place])    Past Surgical History:  Past Surgical History:  Procedure Laterality Date  . CIRCUMCISION    . COLONOSCOPY N/A 09/13/2016   Procedure: COLONOSCOPY;  Surgeon: Rogene [place], MD;  [facility];  Service: Endoscopy;  Laterality: N/A;  2:15  . COLONOSCOPY N/A 04/06/2018   Procedure: COLONOSCOPY;  Surgeon: Rogene [place], MD;  [facility];  Service: Endoscopy;  Laterality: N/A;  . FLEXIBLE SIGMOIDOSCOPY N/A 01/10/2018   Procedure: FLEXIBLE SIGMOIDOSCOPY;  Surgeon: Rogene [place], MD;  [facility];  Service: Endoscopy;  Laterality: N/A;  1:55  . FLEXIBLE SIGMOIDOSCOPY N/A 06/08/2018   Procedure: FLEXIBLE SIGMOIDOSCOPY;  Surgeon: Danie Binder, MD;  [facility];  Service: Endoscopy;  Laterality: N/A;  . HEMORRHOID SURGERY N/A 04/09/2018   Procedure: HEMORRHOIDECTOMY;  Surgeon: Aviva Signs, MD;  Location: [facility];  Service: General;  Laterality: N/A;  . NO PAST SURGERIES    . POLYPECTOMY  04/06/2018   Procedure: POLYPECTOMY;  Surgeon: Rogene [place], MD;  [facility];  Service: Endoscopy;;  sigmoid  . PROSTATE BIOPSY    . VIDEO ASSISTED THORACOSCOPY (VATS)/EMPYEMA Left 07/07/2018   Procedure: VIDEO ASSISTED THORACOSCOPY (VATS)/DRAINAGE OF EMPYEMA, DECORTICATION;  Surgeon: [name], MD;  Location: [facility];  Service: Thoracic;  Laterality: Left;   HPI:  67 yo man with a history of ethanol abuse, seizures, hyperlipidemia, aspiration pneumonia, prostate cancer and recent GI bleed  who presented to [facility] yesterday with left sided CP and shortness of breath., tachycardia and febrile. Found to have a left pleural effusion and empyema. Intubated briefly for thorocentesis with drain inserted. CXR Stable atelectasis/consolidation in the mid and lower left lung. Right lung   Assessment / Plan / Recommendation Clinical Impression  Pt awake with alertness continuing to improve following anethesia and procedure yesterday. Oral motor strength and ROM appeared WFL's. Generally he appears fatigued and deconditioned with hospitalization. He denied esophageal impairments (hx ETOH) or history of pharyngeal dysphagia. Adequate  respiratory/swallow reciprocity with exhalation post swallow without s/s penetration or aspiration during session. Regular texture, thin liquids recommended, straws allowed, pills with thin. ST will continue to observed for safety with recommendations.   SLP Visit Diagnosis: Dysphagia, unspecified (R13.10)    Aspiration Risk  Mild aspiration risk    Diet Recommendation Regular;Thin liquid   Liquid Administration via: Cup;Straw Medication Administration: Whole meds with liquid Supervision: Patient able to self feed;Intermittent supervision to cue for compensatory strategies Compensations: Slow rate;Small sips/bites Postural Changes: Seated upright at 90 degrees    Other  Recommendations Oral Care Recommendations: Oral care BID   Follow up Recommendations None      Frequency and Duration min 2x/week  2 weeks       Prognosis Prognosis for Safe Diet Advancement: Good      Swallow Study   General HPI: 67 yo man with a history of ethanol abuse, seizures, hyperlipidemia, aspiration pneumonia, prostate cancer and recent GI bleed who presented to [facility] yesterday with left  sided CP and shortness of breath., tachycardia and febrile. Found to have a left pleural effusion and empyema. Intubated briefly for thorocentesis with drain inserted. CXR Stable  atelectasis/consolidation in the mid and lower left lung. Right lung Type of Study: Bedside Swallow Evaluation Previous Swallow Assessment: (none) Diet Prior to this Study: NPO Temperature Spikes Noted: Yes Respiratory Status: Nasal cannula History of Recent Intubation: Yes Length of Intubations (days): (during procedure) Date extubated: 07/07/18 Behavior/Cognition: Alert;Cooperative;Pleasant mood(slightly drowsy) Oral Cavity Assessment: Within Functional Limits Oral Care Completed by SLP: No Oral Cavity - Dentition: Missing dentition(partial not present) Vision: Functional for self-feeding Self-Feeding Abilities: Able to feed self;Needs set up Patient Positioning: Upright in bed Baseline Vocal Quality: Low vocal intensity Volitional Cough: Other (Comment)(weak d/t pain with chest tube) Volitional Swallow: Able to elicit    Oral/Motor/Sensory Function Overall Oral Motor/Sensory Function: Within functional limits   Ice Chips Ice chips: Not tested   Thin Liquid Thin Liquid: Within functional limits Presentation: Cup;Straw    Nectar Thick Nectar Thick Liquid: Not tested   Honey Thick Honey Thick Liquid: Not tested   Puree Puree: Within functional limits   Solid     Solid: Within functional limits      Clifford [name] 07/08/2018,12:27 PM   Clifford [name].[employer] [phone]

## 2018-07-09 ENCOUNTER — Inpatient Hospital Stay (HOSPITAL_COMMUNITY): Payer: Medicare Other

## 2018-07-09 ENCOUNTER — Ambulatory Visit (INDEPENDENT_AMBULATORY_CARE_PROVIDER_SITE_OTHER): Payer: Medicare Other | Admitting: Internal Medicine

## 2018-07-09 LAB — ACID FAST SMEAR (AFB)
ACID FAST SMEAR - AFSCU2: NEGATIVE
ACID FAST SMEAR - AFSCU2: NEGATIVE

## 2018-07-09 LAB — BPAM RBC
BLOOD PRODUCT EXPIRATION DATE: 201908282359
BLOOD PRODUCT EXPIRATION DATE: 201909152359
ISSUE DATE / TIME: 201908260942
ISSUE DATE / TIME: 201908261819
UNIT TYPE AND RH: 6200
Unit Type and Rh: 600

## 2018-07-09 LAB — CBC
HCT: 32 % — ABNORMAL LOW (ref 39.0–52.0)
HEMOGLOBIN: 10.4 g/dL — AB (ref 13.0–17.0)
MCH: 28.8 pg (ref 26.0–34.0)
MCHC: 32.5 g/dL (ref 30.0–36.0)
MCV: 88.6 fL (ref 78.0–100.0)
Platelets: 143 10*3/uL — ABNORMAL LOW (ref 150–400)
RBC: 3.61 MIL/uL — ABNORMAL LOW (ref 4.22–5.81)
RDW: 14.7 % (ref 11.5–15.5)
WBC: 13.1 10*3/uL — ABNORMAL HIGH (ref 4.0–10.5)

## 2018-07-09 LAB — COMPREHENSIVE METABOLIC PANEL
ALK PHOS: 65 U/L (ref 38–126)
ALT: 12 U/L (ref 0–44)
ANION GAP: 9 (ref 5–15)
AST: 25 U/L (ref 15–41)
Albumin: 1.6 g/dL — ABNORMAL LOW (ref 3.5–5.0)
BILIRUBIN TOTAL: 1.3 mg/dL — AB (ref 0.3–1.2)
BUN: 12 mg/dL (ref 8–23)
CALCIUM: 6.8 mg/dL — AB (ref 8.9–10.3)
CO2: 21 mmol/L — AB (ref 22–32)
Chloride: 106 mmol/L (ref 98–111)
Creatinine, Ser: 1.23 mg/dL (ref 0.61–1.24)
GFR calc Af Amer: >60 mL/min (ref >60)
GFR calc non Af Amer: 59 mL/min — ABNORMAL LOW (ref >60)
Glucose, Bld: 94 mg/dL (ref 70–99)
POTASSIUM: 3.6 mmol/L (ref 3.5–5.1)
SODIUM: 136 mmol/L (ref 135–145)
TOTAL PROTEIN: 4.6 g/dL — AB (ref 6.5–8.1)

## 2018-07-09 LAB — TYPE AND SCREEN
ABO/RH(D): A POS
Antibody Screen: NEGATIVE
UNIT DIVISION: 0
Unit division: 0

## 2018-07-09 LAB — GLUCOSE, CAPILLARY
GLUCOSE-CAPILLARY: 93 mg/dL (ref 70–99)
Glucose-Capillary: 93 mg/dL (ref 70–99)
Glucose-Capillary: 121 mg/dL — ABNORMAL HIGH (ref 70–99)
Glucose-Capillary: 98 mg/dL (ref 70–99)

## 2018-07-09 LAB — ACID FAST SMEAR (AFB, MYCOBACTERIA)

## 2018-07-09 MED ORDER — SODIUM CHLORIDE 0.9 % IV SOLN: 1.0000 g | INTRAVENOUS | Status: DC

## 2018-07-09 MED ORDER — SODIUM CHLORIDE 0.9 % IV SOLN
2.0000 g | INTRAVENOUS | Status: DC
  Administered 2018-07-09 – 2018-07-12 (×4): 2 g via INTRAVENOUS
  Filled 2018-07-09 (×4): qty 20

## 2018-07-09 NOTE — Progress Notes (Addendum)
      [address]       [place] [address]             [phone]       2 Days Post-Op Procedure(s) (LRB): VIDEO ASSISTED THORACOSCOPY (VATS)/DRAINAGE OF EMPYEMA, DECORTICATION (Left)   Subjective:  No new complaints.  Pain controlled  Objective: Vital signs in last 24 hours: Temp:  [98.9 F (37.2 C)-101.3 F (38.5 C)] 100 F (37.8 C) (08/27 0728) Pulse Rate:  [81-110] 90 (08/27 0728) Cardiac Rhythm: Normal sinus rhythm (08/27 0800) Resp:  [14-31] 21 (08/27 0807) BP: (102-140)/(66-88) 135/72 (08/27 0728) SpO2:  [90 %-96 %] 96 % (08/27 0807) FiO2 (%):  [21 %] 21 % (08/27 0807) Weight:  [69.9 kg] 69.9 kg (08/27 0500)  Intake/Output from previous day: 08/26 0701 - 08/27 0700 In: 9532 [P.O.:240; I.V.:48.3; Blood:865.7] Out: 545 [Urine:375; Chest Tube:170]  General appearance: alert, cooperative and no distress Heart: regular rate and rhythm Lungs: diminished breath sounds bibasilar Abdomen: soft, non-tender; bowel sounds normal; no masses,  no organomegaly Extremities: extremities normal, atraumatic, no cyanosis or edema Wound: clean and dry  Lab Results: Recent Labs    07/08/18 0359 07/09/18 0450  WBC 13.2* 13.1*  HGB 7.2* 10.4*  HCT 23.6* 32.0*  PLT 170 143*   BMET:  Recent Labs    07/08/18 0359 07/09/18 0450  NA 133* 136  K 3.8 3.6  CL 107 106  CO2 19* 21*  GLUCOSE 159* 94  BUN 19 12  CREATININE 1.42* 1.23  CALCIUM 6.5* 6.8*    PT/INR:  Recent Labs    07/06/18 2131  LABPROT 17.2*  INR 1.42   ABG    Component Value Date/Time   PHART 7.444 07/08/2018 0355   HCO3 19.7 (L) 07/08/2018 0355   ACIDBASEDEF 3.7 (H) 07/08/2018 0355   O2SAT 92.1 07/08/2018 0355   CBG (last 3)  Recent Labs    07/08/18 1205 07/08/18 1728 07/08/18 2128  GLUCAP 119* 120* 108*    Assessment/Plan: S/P Procedure(s) (LRB): VIDEO ASSISTED THORACOSCOPY (VATS)/DRAINAGE OF EMPYEMA, DECORTICATION (Left)  1. Chest tube- no air leak, will place  chest tube to water seal... Level is out 470 in the pleurovac 2. Pulm- no acute issues, off oxygen, continue IS 3. ID- OR cultures showed Klebsiella pneumoniae, continue Zosyn, sensitivities remains pending 4. Dispo- patient stable, place chest tubes to water seal, continue ABX, care per primary   LOS: 4 days    Clifford [name] 07/09/2018  D/c foley today  Ct to water seal  I have seen and examined Clifford [name] and agree with the above assessment  and plan.  Clifford Isaac MD [facility] [phone] Office [phone] 07/09/2018 10:07 AM

## 2018-07-09 NOTE — Care Management Note (Addendum)
Case Management Note  Patient Details  Name: Clifford [name] MRN: [id number] Date of Birth: February 23, 1951  Subjective/Objective:   Pt is s/p VATS                 Action/Plan:  PTA independent from home with wife.  Pt currently abuses alcohol - CSW consulted. Pt has PCP and denied barriers with paying for medications as prescribed.  CM will continue to follow    Expected Discharge Date:                  Expected Discharge Plan:  Home/Self Care  In-House Referral:     Discharge planning Services  CM Consult  Post Acute Care Choice:    Choice offered to:     DME Arranged:    DME Agency:     HH Arranged:    HH Agency:     Status of Service:     If discussed at [employer] of Stay Meetings, dates discussed:    Additional Comments:  Maryclare Labrador, RN 07/09/2018, 3:14 PM

## 2018-07-09 NOTE — Progress Notes (Signed)
PROGRESS NOTE  Clifford [name] QIO:[id number] DOB: Dec 12, 1950 DOA: Jul 23, 2018 PCP: [name], FNP   LOS: 4 days   Brief Narrative / Interim history: Clifford [name] is transferred from [facility] where he presented yesterday with left sided chest pain, cough, fevers, found to have a pleural effusion and concerns for aspiration pneumonia. Fluid showed evidence of empyema with WBC > 100,000 with 96% neutrophils.  Thoracic surgery was consulted and patient underwent VATS on 8/25  Assessment & Plan: Principal Problem:   Pleural effusion, left Active Problems:   Alcoholism ([place])   Seizures ([place])   Hyperlipidemia   Essential hypertension   Malignant neoplasm of prostate ([place])   Aspiration pneumonia of both lower lobes due to gastric secretions ([facility])   Sepsis ([place])   Sepsis due to aspiration pneumonia complicated by hypoxic respiratory failure and left loculated empyema -CT scan just prior to transfer shows large left pleural effusion -Thoracic surgery consulted and he is status post VATS on 8/25 -Continue Zosyn, intraoperative cultures show Klebsiella, sensitivities pending  ETOH abuse -patient drinks 1.5L bottle of gin daily.  -He is not significantly triggering CIWA -Discussed extensively with patient regarding complete alcohol cessation  AKI -due to sepsis, -Creatinine has now normalized with hydration and resolution of sepsis  Anemia -Hemoglobin slightly worse likely dilutional, probably has a component of anemia in the setting of sepsis and potentially bone marrow suppression from long-standing alcohol abuse along with acute blood loss anemia postop -Status post 2 units of packed red blood cells on 8/26, hemoglobin improved to 10.4 this morning, continue to monitor  HTN -hold home meds   DVT prophylaxis: SCDs Code Status: Full code Family Communication: No family present at bedside this morning Disposition Plan: To be determined  Consultants:    Cardiothoracic surgery  Procedures:   VATS 8/25  Antimicrobials:  Unasyn 8/22-8/24  Zosyn 8/24-  Subjective: -Feeling better than yesterday, still has some mild left chest soreness especially when he moves around, denies any shortness of breath.  Denies any chest pain.  No abdominal pain, nausea or vomiting.  Objective: Vitals:   07/09/18 0433 07/09/18 0500 07/09/18 0728 07/09/18 0807  BP:   135/72   Pulse:   90   Resp: (!) 22  (!) 25 (!) 21  Temp:   100 F (37.8 C)   TempSrc:   Oral   SpO2: 94%  90% 96%  Weight:  69.9 kg    Height:        Intake/Output Summary (Last 24 hours) at 07/09/2018 1054 Last data filed at 07/09/2018 0600 Gross per 24 hour  Intake 1154.01 ml  Output 545 ml  Net 609.01 ml   Filed Weights   23-Jul-2018 1133 07-23-2018 1502 07/09/18 0500  Weight: 63.5 kg 65.4 kg 69.9 kg    Examination:  Constitutional: NAD Eyes: No scleral icterus ENMT: MMM Respiratory: Moves air well bilaterally, no wheezing or crackles heard. Cardiovascular: Regular rate and rhythm, no murmurs.  No peripheral edema. Abdomen: Soft, nontender, nondistended.  Bowel sounds positive.  No rebound or guarding. Skin: No rashes seen Neurologic: Strength 5 out of 5 in all 4 extremities,   Data Reviewed: I have independently reviewed following labs and imaging studies    CBC: Recent Labs  Lab 07/23/2018 1156 07/06/18 0510 07/06/18 2131 07/07/18 0317 07/08/18 0359 07/09/18 0450  WBC 14.3* 16.0* 14.4* 12.4* 13.2* 13.1*  NEUTROABS 12.1*  --   --  10.9*  --   --   HGB 10.2* 8.4*  7.9* 7.3* 7.2* 10.4*  HCT 32.7* 27.0* 25.3* 23.5* 23.6* 32.0*  MCV 91.1 89.7 90.4 90.7 90.1 88.6  PLT 370 221 168 159 170 443*   Basic Metabolic Panel: Recent Labs  Lab 07/06/18 0510 07/06/18 2131 07/07/18 0317 07/08/18 0359 07/09/18 0450  NA 135 136 135 133* 136  K 5.0 4.4 4.1 3.8 3.6  CL 105 107 107 107 106  CO2 21* 19* 19* 19* 21*  GLUCOSE 145* 128* 120* 159* 94  BUN 25* 23 24* 19 12   CREATININE 1.76* 1.71* 1.73* 1.42* 1.23  CALCIUM 7.3* 6.9* 6.7* 6.5* 6.8*  MG  --   --   --  1.2*  --   PHOS  --   --   --  3.2  --    GFR: Estimated Creatinine Clearance: 50.7 mL/min (by C-G formula based on SCr of 1.23 mg/dL). Liver Function Tests: Recent Labs  Lab 07/05/18 1156 07/06/18 0510 07/06/18 2131 07/08/18 0359 07/09/18 0450  AST 57* 35 23 18 25   ALT 19 17 14 11 12   ALKPHOS 87 48 51 55 65  BILITOT 1.2 0.6 0.9 0.6 1.3*  PROT 7.4 5.6* 4.9* 4.3* 4.6*  ALBUMIN 3.2* 2.3* 1.9* 1.5* 1.6*   Recent Labs  Lab 07/06/18 1723  LIPASE 44   No results for input(s): AMMONIA in the last 168 hours. Coagulation Profile: Recent Labs  Lab 07/05/18 1156 07/06/18 2131  INR 1.10 1.42   Cardiac Enzymes: No results for input(s): CKTOTAL, CKMB, CKMBINDEX, TROPONINI in the last 168 hours. BNP (last 3 results) No results for input(s): PROBNP in the last 8760 hours. HbA1C: No results for input(s): HGBA1C in the last 72 hours. CBG: Recent Labs  Lab 07/08/18 0740 07/08/18 1205 07/08/18 1728 07/08/18 2128 07/09/18 0727  GLUCAP 136* 119* 120* 108* 93   Lipid Profile: No results for input(s): CHOL, HDL, LDLCALC, TRIG, CHOLHDL, LDLDIRECT in the last 72 hours. Thyroid Function Tests: No results for input(s): TSH, T4TOTAL, FREET4, T3FREE, THYROIDAB in the last 72 hours. Anemia Panel: No results for input(s): VITAMINB12, FOLATE, FERRITIN, TIBC, IRON, RETICCTPCT in the last 72 hours. Urine analysis:    Component Value Date/Time   COLORURINE AMBER (A) 07/06/2018 2048   APPEARANCEUR HAZY (A) 07/06/2018 2048   LABSPEC 1.021 07/06/2018 2048   PHURINE 5.0 07/06/2018 2048   GLUCOSEU NEGATIVE 07/06/2018 2048   HGBUR NEGATIVE 07/06/2018 2048   BILIRUBINUR NEGATIVE 07/06/2018 2048   KETONESUR NEGATIVE 07/06/2018 2048   PROTEINUR NEGATIVE 07/06/2018 2048   NITRITE NEGATIVE 07/06/2018 2048   LEUKOCYTESUR NEGATIVE 07/06/2018 2048   Sepsis Labs: Invalid input(s): PROCALCITONIN,  LACTICIDVEN  Recent Results (from the past 240 hour(s))  Culture, blood (Routine x 2)     Status: None (Preliminary result)   Collection Time: 07/05/18 11:56 AM  Result Value Ref Range Status   Specimen Description BLOOD LEFT ARM  Final   Special Requests   Final    BOTTLES DRAWN AEROBIC AND ANAEROBIC Blood Culture adequate volume   Culture   Final    NO GROWTH 4 DAYS Performed at [facility], [address] [place], [place] [address]    Report Status PENDING  Incomplete  Culture, blood (Routine x 2)     Status: None (Preliminary result)   Collection Time: 07/05/18 12:02 PM  Result Value Ref Range Status   Specimen Description BLOOD RIGHT HAND  Final   Special Requests   Final    BOTTLES DRAWN AEROBIC AND ANAEROBIC Blood Culture results may not  be optimal due to an inadequate volume of blood received in culture bottles   Culture   Final    NO GROWTH 4 DAYS Performed at [facility], [address], [place], [place] [address]    Report Status PENDING  Incomplete  Gram stain     Status: None   Collection Time: 07/05/18  1:35 PM  Result Value Ref Range Status   Specimen Description PLEURAL  Final   Special Requests NONE  Final   Gram Stain   Final    NO ORGANISMS SEEN WBC PRESENT, PREDOMINANTLY PMN CYTOSPIN SMEAR Performed at [facility], [address], [place], [place] [address]    Report Status 07/05/2018 FINAL  Final  Culture, body fluid-bottle     Status: None (Preliminary result)   Collection Time: 07/05/18  1:35 PM  Result Value Ref Range Status   Specimen Description PLEURAL  Final   Special Requests BOTTLES DRAWN AEROBIC AND ANAEROBIC 10CC  Final   Culture   Final    NO GROWTH 4 DAYS Performed at [facility], [address], [place], [place] [address]    Report Status PENDING  Incomplete  Surgical PCR screen     Status: None   Collection Time: 07/07/18  8:42 AM  Result Value Ref Range Status   MRSA, PCR NEGATIVE NEGATIVE Final   Staphylococcus aureus NEGATIVE  NEGATIVE Final    Comment: (NOTE) The Xpert SA Assay (FDA approved for NASAL specimens in patients 18 years of age and older), is one component of a comprehensive surveillance program. It is not intended to diagnose infection nor to guide or monitor treatment. Performed at [facility], [place] [address] [place], [place] [address]   Anaerobic culture     Status: None (Preliminary result)   Collection Time: 07/07/18 10:37 AM  Result Value Ref Range Status   Specimen Description FLUID PLEURAL LEFT  Final   Special Requests   Final    NONE Performed at [facility], [place] [address], [place], [place] [address]    Gram Stain PENDING  Incomplete   Culture   Final    NO ANAEROBES ISOLATED; CULTURE IN PROGRESS FOR 5 DAYS   Report Status PENDING  Incomplete  Body fluid culture     Status: None (Preliminary result)   Collection Time: 07/07/18 10:37 AM  Result Value Ref Range Status   Specimen Description PLEURAL LEFT  Final   Special Requests NONE  Final   Gram Stain   Final    MODERATE WBC PRESENT, PREDOMINANTLY PMN NO ORGANISMS SEEN    Culture   Final    NO GROWTH 2 DAYS Performed at [facility], [place] [address], [place], [place] [address]    Report Status PENDING  Incomplete  Acid Fast Smear (AFB)     Status: None   Collection Time: 07/07/18 10:37 AM  Result Value Ref Range Status   AFB Specimen Processing Concentration  Final   Acid Fast Smear Negative  Final    Comment: (NOTE) Performed At: [facility] [place], [place] [id number] Rush Farmer MD [id number]    Source (AFB) FLUID  Final    Comment: PLEURAL LEFT Performed at [facility], [place] [address], [place], [place] [address]   Aerobic/Anaerobic Culture (surgical/deep wound)     Status: None (Preliminary result)   Collection Time: 07/07/18 12:08 PM  Result Value Ref Range Status   Specimen Description TISSUE PLEURAL  Final  Special Requests NONE  Final   Gram Stain    Final    MODERATE WBC PRESENT, PREDOMINANTLY PMN NO ORGANISMS SEEN Performed at [facility], [place] [address], [place], [place] [address]    Culture RARE KLEBSIELLA PNEUMONIAE  Final   Report Status PENDING  Incomplete  Acid Fast Smear (AFB)     Status: None   Collection Time: 07/07/18 12:08 PM  Result Value Ref Range Status   AFB Specimen Processing Concentration  Final   Acid Fast Smear Negative  Final    Comment: (NOTE) Performed At: [facility] [place], [place] [id number] Rush Farmer MD [id number]    Source (AFB) TISSUE  Final    Comment: PLEURAL Performed at [facility], [place] [address] [place], [place] [address]       Radiology Studies: Dg Chest Port 1 View  Result Date: 07/09/2018 CLINICAL DATA:  Chest tube.  Shortness of breath.  Pleural effusion. EXAM: PORTABLE CHEST 1 VIEW COMPARISON:  07/08/2018. FINDINGS: Left chest tube, right IJ line stable position. Stable cardiomegaly. Stable diffuse left lung infiltrate and left-sided pleural effusion. No pneumothorax. Chest is unchanged from prior exam. IMPRESSION: 1. Stable line and tube placements including 2 left chest tubes. No pneumothorax. 2. Diffuse left lung infiltrate left-sided pleural effusion. No interim change from prior exam. Electronically Signed   By: Marcello Moores  Register   On: 07/09/2018 09:41   Dg Chest Port 1 View  Result Date: 07/08/2018 CLINICAL DATA:  Empyema of pleural space ,,chest tube present EXAM: PORTABLE CHEST - 1 VIEW COMPARISON:  the previous day's study FINDINGS: Stable left chest tubes, directed towards the apex. No pneumothorax. Residual pleural thickening or layering effusion. Stable atelectasis/consolidation in the mid and lower left lung. Right lung remains clear. Stable right IJ central line.  Heart size within normal limits. Visualized bones unremarkable. IMPRESSION: Stable appearance since previous day's exam Electronically Signed   By: Lucrezia Europe M.D.    On: 07/08/2018 08:07   Dg Chest Port 1 View  Result Date: 07/07/2018 CLINICAL DATA:  Empyema. Postoperative. EXAM: PORTABLE CHEST 1 VIEW COMPARISON:  Chest radiograph from one day prior. FINDINGS: Two left apical chest tubes are in place. Right internal jugular central venous catheter terminates at the cavoatrial junction. Stable cardiomediastinal silhouette with top-normal heart size. No pneumothorax. No right pleural effusion. Residual mild peripheral left pleural thickening without significant residual left pleural effusion. Improved aeration of the left lung with persistent patchy opacity throughout the left lung. Clear right lung. IMPRESSION: 1. No pneumothorax. 2. Residual mild peripheral left pleural thickening without significant residual left pleural effusion. 3. Improved aeration of the left lung with persistent patchy opacity throughout the left lung. Electronically Signed   By: Ilona Sorrel M.D.   On: 07/07/2018 14:02     Scheduled Meds: . sodium chloride   Intravenous Once  . B-complex with vitamin C  1 tablet Oral q morning - 10a  . bisacodyl  10 mg Oral Daily  . enoxaparin (LOVENOX) injection  40 mg Subcutaneous Q24H  . fentaNYL   Intravenous Q2V  . folic acid  1 mg Oral Daily  . insulin aspart  0-24 Units Subcutaneous TID AC & HS  . levalbuterol  0.63 mg Nebulization BID  . LORazepam  0-4 mg Intravenous Q12H  . multivitamin with minerals  1 tablet Oral Daily  . mupirocin ointment  1 application Nasal BID  . pantoprazole  40 mg Oral Daily  . senna-docusate  1 tablet Oral QHS  . thiamine  100 mg Oral Daily   Or  . thiamine  100 mg Intravenous Daily   Continuous Infusions: . sodium chloride 10 mL/hr at 07/09/18 0300  . dextrose 5 % and 0.45% NaCl 125 mL/hr at 07/08/18 0300  . piperacillin-tazobactam (ZOSYN)  IV 3.375 g (07/09/18 0530)  . potassium chloride      Marzetta Board, MD, PhD [facility] Pager [phone]  If 7PM-7AM, please contact  night-coverage [email or web address] Password [facility] 07/09/2018, 10:54 AM

## 2018-07-09 NOTE — Progress Notes (Signed)
  Speech Language Pathology Treatment: Dysphagia  Patient Details Name: Clifford [name] MRN: [id number] DOB: [birth year] Today's Date: 07/09/2018 Time: 1410-3013 SLP Time Calculation (min) (ACUTE ONLY): 8 min  Assessment / Plan / Recommendation Clinical Impression  Pt seen for swallow treatment following evaluation yesterday. He denied difficulty since initiating po's yesterday. Hiccups noted after intake solid and thin liquids likely due to current and baseline GI status. No s/s aspiration with intake. SLP reviewed strategies with emphasis on remaining upright after meals due to suspected esophageal impairments with ETOH use. Recommend continue regular/thin, upright 45 min after meals, follow liquids and solids. ST will sign off.    HPI HPI: 67 yo man with a history of ethanol abuse, seizures, hyperlipidemia, aspiration pneumonia, prostate cancer and recent GI bleed who presented to [facility] yesterday with left sided CP and shortness of breath., tachycardia and febrile. Found to have a left pleural effusion and empyema. Intubated briefly for thorocentesis with drain inserted. CXR Stable atelectasis/consolidation in the mid and lower left lung. Right lung      SLP Plan  All goals met;Discharge SLP treatment due to (comment)       Recommendations  Diet recommendations: Regular;Thin liquid Liquids provided via: Cup;Straw Medication Administration: Whole meds with liquid Supervision: Patient able to self feed Compensations: Slow rate;Small sips/bites Postural Changes and/or Swallow Maneuvers: Upright 30-60 min after meal;Seated upright 90 degrees                Oral Care Recommendations: Oral care BID Follow up Recommendations: None SLP Visit Diagnosis: Dysphagia, unspecified (R13.10) Plan: All goals met;Discharge SLP treatment due to (comment)                       Houston Siren 07/09/2018, 3:25 PM   Orbie Pyo Colvin Caroli.[employer] [phone]

## 2018-07-10 ENCOUNTER — Inpatient Hospital Stay (HOSPITAL_COMMUNITY): Payer: Medicare Other

## 2018-07-10 LAB — CBC
HCT: 32 % — ABNORMAL LOW (ref 39.0–52.0)
Hemoglobin: 10.3 g/dL — ABNORMAL LOW (ref 13.0–17.0)
MCH: 28.6 pg (ref 26.0–34.0)
MCHC: 32.2 g/dL (ref 30.0–36.0)
MCV: 88.9 fL (ref 78.0–100.0)
PLATELETS: 154 10*3/uL (ref 150–400)
RBC: 3.6 MIL/uL — ABNORMAL LOW (ref 4.22–5.81)
RDW: 14.8 % (ref 11.5–15.5)
WBC: 11.8 10*3/uL — ABNORMAL HIGH (ref 4.0–10.5)

## 2018-07-10 LAB — CULTURE, BLOOD (ROUTINE X 2)
CULTURE: NO GROWTH
Culture: NO GROWTH
Special Requests: ADEQUATE

## 2018-07-10 LAB — GLUCOSE, CAPILLARY
Glucose-Capillary: 88 mg/dL (ref 70–99)
Glucose-Capillary: 130 mg/dL — ABNORMAL HIGH (ref 70–99)
Glucose-Capillary: 91 mg/dL (ref 70–99)
Glucose-Capillary: 115 mg/dL — ABNORMAL HIGH (ref 70–99)

## 2018-07-10 LAB — CULTURE, BODY FLUID W GRAM STAIN -BOTTLE: Culture: NO GROWTH

## 2018-07-10 LAB — BASIC METABOLIC PANEL
Anion gap: 9 (ref 5–15)
BUN: 8 mg/dL (ref 8–23)
CO2: 19 mmol/L — ABNORMAL LOW (ref 22–32)
Calcium: 6.5 mg/dL — ABNORMAL LOW (ref 8.9–10.3)
Chloride: 108 mmol/L (ref 98–111)
Creatinine, Ser: 0.88 mg/dL (ref 0.61–1.24)
GFR calc Af Amer: >60 mL/min (ref >60)
GFR calc non Af Amer: >60 mL/min (ref >60)
Glucose, Bld: 89 mg/dL (ref 70–99)
Potassium: 3.4 mmol/L — ABNORMAL LOW (ref 3.5–5.1)
Sodium: 136 mmol/L (ref 135–145)

## 2018-07-10 LAB — CULTURE, BODY FLUID-BOTTLE

## 2018-07-10 MED ORDER — LEVALBUTEROL HCL 0.63 MG/3ML IN NEBU: 0.6300 mg | INHALATION_SOLUTION | Freq: Four times a day (QID) | RESPIRATORY_TRACT | Status: DC | PRN

## 2018-07-10 MED ORDER — POTASSIUM CHLORIDE CRYS ER 20 MEQ PO TBCR
40.0000 meq | EXTENDED_RELEASE_TABLET | Freq: Once | ORAL | Status: AC
  Administered 2018-07-10: 40 meq via ORAL
  Filled 2018-07-10: qty 2

## 2018-07-10 MED ORDER — AMLODIPINE BESYLATE 5 MG PO TABS
5.0000 mg | ORAL_TABLET | Freq: Every day | ORAL | Status: DC
  Administered 2018-07-10 – 2018-07-12 (×3): 5 mg via ORAL
  Filled 2018-07-10 (×3): qty 1

## 2018-07-10 NOTE — Plan of Care (Signed)
Pt is progressing. Continue with plan of care. 

## 2018-07-10 NOTE — Progress Notes (Signed)
PROGRESS NOTE        PATIENT DETAILS Name: Clifford [name] Age: 67 y.o. Sex: male Date of Birth: 1951/11/05 Admit Date: 07/05/2018 Admitting Physician Estela Leonie Green, MD VPX:[id number], Lehman Prom, FNP  Brief Narrative: Patient is a 67 y.o. male history of hypertension, EtOH use he admitted to any pain hospital for pneumonia, he was found on further evaluation to have empyema.  Patient was transferred to [facility], underwent VATS on 8/25.  See below for further details  Subjective: Doing well-does not have any chest pain or short of breath.  Assessment/Plan: Sepsis secondary to aspiration pneumonia and empyema with acute hypoxic respiratory failure: Sepsis pathophysiology has resolved, underwent a VATS on 8/25.  Intraoperative cultures positive for Klebsiella, no longer on Zosyn, on Rocephin.  Suspect we could transition to Keflex on discharge.  Cardiothoracic surgery managing chest tube care.  Acute kidney injury: Hemodynamically mediated in the setting of sepsis, resolved with supportive care.  EtOH use: Managed with Ativan per CIWA protocol, currently out of the window for withdrawal.  Counseled.  Anemia: Secondary to critical illness, transfused 2 units of PRBC on 8/26.  Hemoglobin stable at 10.3.  Follow CBC periodically.  Hypertension: Blood pressure slowly creeping up, start amlodipine and follow.  DVT Prophylaxis: Prophylactic Lovenox   Code Status: Full code   Family Communication: None at bedside  Disposition Plan: Remain inpatient-home when cleared by cardiothoracic surgery  Antimicrobial agents: Anti-infectives (From admission, onward)   Start     Dose/Rate Route Frequency Ordered Stop   07/09/18 1245  cefTRIAXone (ROCEPHIN) 2 g in sodium chloride 0.9 % 100 mL IVPB     2 g 200 mL/hr over 30 Minutes Intravenous Every 24 hours 07/09/18 1230     07/09/18 1230  cefTRIAXone (ROCEPHIN) 1 g in sodium chloride 0.9 % 100 mL  IVPB  Status:  Discontinued     1 g 200 mL/hr over 30 Minutes Intravenous Every 24 hours 07/09/18 1226 07/09/18 1230   07/07/18 0600  vancomycin (VANCOCIN) IVPB 1000 mg/200 mL premix     1,000 mg 200 mL/hr over 60 Minutes Intravenous On call to O.R. 07/06/18 2019 07/07/18 1043   07/06/18 1800  piperacillin-tazobactam (ZOSYN) IVPB 3.375 g  Status:  Discontinued     3.375 g 12.5 mL/hr over 240 Minutes Intravenous Every 8 hours 07/06/18 1658 07/09/18 1226   07/06/18 1300  azithromycin (ZITHROMAX) 500 mg in sodium chloride 0.9 % 250 mL IVPB  Status:  Discontinued     500 mg 250 mL/hr over 60 Minutes Intravenous Every 24 hours 07/05/18 1852 07/06/18 1225   07/05/18 2000  Ampicillin-Sulbactam (UNASYN) 3 g in sodium chloride 0.9 % 100 mL IVPB  Status:  Discontinued     3 g 200 mL/hr over 30 Minutes Intravenous Every 8 hours 07/05/18 1913 07/06/18 1650   07/05/18 1200  cefTRIAXone (ROCEPHIN) 2 g in sodium chloride 0.9 % 100 mL IVPB  Status:  Discontinued     2 g 200 mL/hr over 30 Minutes Intravenous Every 24 hours 07/05/18 1149 07/05/18 1844   07/05/18 1200  azithromycin (ZITHROMAX) 500 mg in sodium chloride 0.9 % 250 mL IVPB  Status:  Discontinued     500 mg 250 mL/hr over 60 Minutes Intravenous Every 24 hours 07/05/18 1149 07/05/18 1844      Procedures: 8/25>>VATS  CONSULTS:  CTVS  Time spent: 25- minutes-Greater than 50% of this time was spent in counseling, explanation of diagnosis, planning of further management, and coordination of care.  MEDICATIONS: Scheduled Meds: . sodium chloride   Intravenous Once  . B-complex with vitamin C  1 tablet Oral q morning - 10a  . bisacodyl  10 mg Oral Daily  . enoxaparin (LOVENOX) injection  40 mg Subcutaneous Q24H  . fentaNYL   Intravenous V0J  . folic acid  1 mg Oral Daily  . insulin aspart  0-24 Units Subcutaneous TID AC & HS  . multivitamin with minerals  1 tablet Oral Daily  . pantoprazole  40 mg Oral Daily  . senna-docusate  1 tablet  Oral QHS  . thiamine  100 mg Oral Daily   Or  . thiamine  100 mg Intravenous Daily   Continuous Infusions: . sodium chloride 10 mL/hr at 07/10/18 0300  . cefTRIAXone (ROCEPHIN)  IV Stopped (07/09/18 1400)  . dextrose 5 % and 0.45% NaCl 125 mL/hr at 07/08/18 0300  . potassium chloride     PRN Meds:.sodium chloride, acetaminophen **OR** acetaminophen, diphenhydrAMINE **OR** diphenhydrAMINE, levalbuterol, morphine injection, naloxone **AND** sodium chloride flush, ondansetron **OR** ondansetron (ZOFRAN) IV, oxyCODONE, oxyCODONE, potassium chloride, senna-docusate, traMADol   PHYSICAL EXAM: Vital signs: Vitals:   07/10/18 0700 07/10/18 0835 07/10/18 0857 07/10/18 0900  BP: (!) 142/78 (!) 155/96  (!) 155/86  Pulse: 86 84  85  Resp: 20 (!) 23 20 (!) 23  Temp:  99.2 F (37.3 C)    TempSrc:  Oral    SpO2: 95% 97% 99% 97%  Weight:      Height:       Filed Weights   07/05/18 1133 07/05/18 1502 07/09/18 0500  Weight: 63.5 kg 65.4 kg 69.9 kg   Body mass index is 25.64 kg/m.   General appearance :Awake, alert, not in any distress. Speech Clear. Not toxic Looking Eyes:, pupils equally reactive to light and accomodation,no scleral icterus.Pink conjunctiva HEENT: Atraumatic and Normocephalic Neck: supple, no JVD. No cervical lymphadenopathy. No thyromegaly Resp:Good air entry bilaterally, no added sounds  CVS: S1 S2 regular, no murmurs.  GI: Bowel sounds present, Non tender and not distended with no gaurding, rigidity or rebound.No organomegaly Extremities: B/L Lower Ext shows no edema, both legs are warm to touch Neurology:  speech clear,Non focal, sensation is grossly intact. Psychiatric: Normal judgment and insight. Alert and oriented x 3. Normal mood. Musculoskeletal:No digital cyanosis Skin:No Rash, warm and dry Wounds:N/A  I have personally reviewed following labs and imaging studies  LABORATORY DATA: CBC: Recent Labs  Lab 07/05/18 1156  07/06/18 2131 07/07/18 0317  07/08/18 0359 07/09/18 0450 07/10/18 0712  WBC 14.3*   < > 14.4* 12.4* 13.2* 13.1* 11.8*  NEUTROABS 12.1*  --   --  10.9*  --   --   --   HGB 10.2*   < > 7.9* 7.3* 7.2* 10.4* 10.3*  HCT 32.7*   < > 25.3* 23.5* 23.6* 32.0* 32.0*  MCV 91.1   < > 90.4 90.7 90.1 88.6 88.9  PLT 370   < > 168 159 170 143* 154   < > = values in this interval not displayed.    Basic Metabolic Panel: Recent Labs  Lab 07/06/18 2131 07/07/18 0317 07/08/18 0359 07/09/18 0450 07/10/18 0523  NA 136 135 133* 136 136  K 4.4 4.1 3.8 3.6 3.4*  CL 107 107 107 106 108  CO2 19* 19* 19* 21* 19*  GLUCOSE 128* 120* 159*  94 89  BUN 23 24* 19 12 8   CREATININE 1.71* 1.73* 1.42* 1.23 0.88  CALCIUM 6.9* 6.7* 6.5* 6.8* 6.5*  MG  --   --  1.2*  --   --   PHOS  --   --  3.2  --   --     GFR: Estimated Creatinine Clearance: 70.9 mL/min (by C-G formula based on SCr of 0.88 mg/dL).  Liver Function Tests: Recent Labs  Lab 07/05/18 1156 07/06/18 0510 07/06/18 2131 07/08/18 0359 07/09/18 0450  AST 57* 35 23 18 25   ALT 19 17 14 11 12   ALKPHOS 87 48 51 55 65  BILITOT 1.2 0.6 0.9 0.6 1.3*  PROT 7.4 5.6* 4.9* 4.3* 4.6*  ALBUMIN 3.2* 2.3* 1.9* 1.5* 1.6*   Recent Labs  Lab 07/06/18 1723  LIPASE 44   No results for input(s): AMMONIA in the last 168 hours.  Coagulation Profile: Recent Labs  Lab 07/05/18 1156 07/06/18 2131  INR 1.10 1.42    Cardiac Enzymes: No results for input(s): CKTOTAL, CKMB, CKMBINDEX, TROPONINI in the last 168 hours.  BNP (last 3 results) No results for input(s): PROBNP in the last 8760 hours.  HbA1C: No results for input(s): HGBA1C in the last 72 hours.  CBG: Recent Labs  Lab 07/09/18 0727 07/09/18 1208 07/09/18 1624 07/09/18 2145 07/10/18 0834  GLUCAP 93 93 121* 98 88    Lipid Profile: No results for input(s): CHOL, HDL, LDLCALC, TRIG, CHOLHDL, LDLDIRECT in the last 72 hours.  Thyroid Function Tests: No results for input(s): TSH, T4TOTAL, FREET4, T3FREE, THYROIDAB  in the last 72 hours.  Anemia Panel: No results for input(s): VITAMINB12, FOLATE, FERRITIN, TIBC, IRON, RETICCTPCT in the last 72 hours.  Urine analysis:    Component Value Date/Time   COLORURINE AMBER (A) 07/06/2018 2048   APPEARANCEUR HAZY (A) 07/06/2018 2048   LABSPEC 1.021 07/06/2018 2048   PHURINE 5.0 07/06/2018 2048   GLUCOSEU NEGATIVE 07/06/2018 2048   HGBUR NEGATIVE 07/06/2018 2048   BILIRUBINUR NEGATIVE 07/06/2018 2048   KETONESUR NEGATIVE 07/06/2018 2048   PROTEINUR NEGATIVE 07/06/2018 2048   NITRITE NEGATIVE 07/06/2018 2048   LEUKOCYTESUR NEGATIVE 07/06/2018 2048    Sepsis Labs: Lactic Acid, Venous    Component Value Date/Time   LATICACIDVEN 1.4 07/07/2018 1555    MICROBIOLOGY: Recent Results (from the past 240 hour(s))  Culture, blood (Routine x 2)     Status: None   Collection Time: 07/05/18 11:56 AM  Result Value Ref Range Status   Specimen Description BLOOD LEFT ARM  Final   Special Requests   Final    BOTTLES DRAWN AEROBIC AND ANAEROBIC Blood Culture adequate volume   Culture   Final    NO GROWTH 5 DAYS Performed at [facility], [address] [place], [place] [address]    Report Status 07/10/2018 FINAL  Final  Culture, blood (Routine x 2)     Status: None   Collection Time: 07/05/18 12:02 PM  Result Value Ref Range Status   Specimen Description BLOOD RIGHT HAND  Final   Special Requests   Final    BOTTLES DRAWN AEROBIC AND ANAEROBIC Blood Culture results may not be optimal due to an inadequate volume of blood received in culture bottles   Culture   Final    NO GROWTH 5 DAYS Performed at [facility], [address] [place], [place] [address]    Report Status 07/10/2018 FINAL  Final  Gram stain     Status: None   Collection  Time: 07/05/18  1:35 PM  Result Value Ref Range Status   Specimen Description PLEURAL  Final   Special Requests NONE  Final   Gram Stain   Final    NO ORGANISMS SEEN WBC PRESENT, PREDOMINANTLY PMN CYTOSPIN  SMEAR Performed at [facility], [address] [place], [place] [address]    Report Status 07/05/2018 FINAL  Final  Culture, body fluid-bottle     Status: None   Collection Time: 07/05/18  1:35 PM  Result Value Ref Range Status   Specimen Description PLEURAL  Final   Special Requests BOTTLES DRAWN AEROBIC AND ANAEROBIC 10CC  Final   Culture   Final    NO GROWTH 5 DAYS Performed at [facility], [address] [place], [place] [address]    Report Status 07/10/2018 FINAL  Final  Surgical PCR screen     Status: None   Collection Time: 07/07/18  8:42 AM  Result Value Ref Range Status   MRSA, PCR NEGATIVE NEGATIVE Final   Staphylococcus aureus NEGATIVE NEGATIVE Final    Comment: (NOTE) The Xpert SA Assay (FDA approved for NASAL specimens in patients 7 years of age and older), is one component of a comprehensive surveillance program. It is not intended to diagnose infection nor to guide or monitor treatment. Performed at [facility], [place] [address], [place], [place] [address]   Anaerobic culture     Status: None (Preliminary result)   Collection Time: 07/07/18 10:37 AM  Result Value Ref Range Status   Specimen Description FLUID PLEURAL LEFT  Final   Special Requests   Final    NONE Performed at [facility], [place] [address], [place], [place] [address]    Gram Stain PENDING  Incomplete   Culture   Final    NO ANAEROBES ISOLATED; CULTURE IN PROGRESS FOR 5 DAYS   Report Status PENDING  Incomplete  Body fluid culture     Status: None (Preliminary result)   Collection Time: 07/07/18 10:37 AM  Result Value Ref Range Status   Specimen Description PLEURAL LEFT  Final   Special Requests NONE  Final   Gram Stain   Final    MODERATE WBC PRESENT, PREDOMINANTLY PMN NO ORGANISMS SEEN    Culture   Final    NO GROWTH 3 DAYS Performed at [facility], [place] [address], [place], [place] [address]    Report Status PENDING  Incomplete  Acid Fast Smear (AFB)     Status:  None   Collection Time: 07/07/18 10:37 AM  Result Value Ref Range Status   AFB Specimen Processing Concentration  Final   Acid Fast Smear Negative  Final    Comment: (NOTE) Performed At: [facility] [place], [place] [id number] Rush Farmer MD QQ:[id number]    Source (AFB) FLUID  Final    Comment: PLEURAL LEFT Performed at [facility], [place] [address] [place],  [place] [address]   Aerobic/Anaerobic Culture (surgical/deep wound)     Status: None (Preliminary result)   Collection Time: 07/07/18 12:08 PM  Result Value Ref Range Status   Specimen Description TISSUE PLEURAL  Final   Special Requests NONE  Final   Gram Stain   Final    MODERATE WBC PRESENT, PREDOMINANTLY PMN NO ORGANISMS SEEN    Culture   Final    RARE KLEBSIELLA PNEUMONIAE NO ANAEROBES ISOLATED; CULTURE IN PROGRESS FOR 5 DAYS CRITICAL RESULT CALLED TO, READ BACK BY AND VERIFIED WITH: Wynetta Emery  RN AT 1200 ON [id number] BY SJW Performed at [facility], [place] [address], [place], [place] [address]    Report Status PENDING  Incomplete   Organism ID, Bacteria KLEBSIELLA PNEUMONIAE  Final      Susceptibility   Klebsiella pneumoniae - MIC*    AMPICILLIN >=32 RESISTANT Resistant     CEFAZOLIN 16 SENSITIVE Sensitive     CEFEPIME <=1 SENSITIVE Sensitive     CEFTAZIDIME <=1 SENSITIVE Sensitive     CEFTRIAXONE <=1 SENSITIVE Sensitive     CIPROFLOXACIN <=0.25 SENSITIVE Sensitive     GENTAMICIN <=1 SENSITIVE Sensitive     IMIPENEM <=0.25 SENSITIVE Sensitive     TRIMETH/SULFA <=20 SENSITIVE Sensitive     AMPICILLIN/SULBACTAM >=32 RESISTANT Resistant     PIP/TAZO 32 INTERMEDIATE Intermediate     Extended ESBL NEGATIVE Sensitive     * RARE KLEBSIELLA PNEUMONIAE  Acid Fast Smear (AFB)     Status: None   Collection Time: 07/07/18 12:08 PM  Result Value Ref Range Status   AFB Specimen Processing Concentration  Final   Acid Fast Smear Negative  Final    Comment: (NOTE) Performed At: [facility] [address] [place], [place] [id number] Rush Farmer MD WY:[id number]    Source (AFB) TISSUE  Final    Comment: PLEURAL Performed at [facility], [place] [address], [place],  [address]     RADIOLOGY STUDIES/RESULTS: Dg Chest 1 View  Result Date: 07/05/2018 CLINICAL DATA:  Post LEFT thoracentesis EXAM: CHEST  1 VIEW COMPARISON:  Earlier study 07/05/2018 FINDINGS: No pneumothorax following LEFT thoracentesis. Persistent LEFT pleural effusion and basilar atelectasis accentuated by expiratory technique. RIGHT lung clear. Bones demineralized. IMPRESSION: No pneumothorax following LEFT thoracentesis. Electronically Signed   By: Lavonia Dana M.D.   On: 07/05/2018 14:25   Dg Chest 2 View  Result Date: 06/24/2018 CLINICAL DATA:  Cough and weakness. EXAM: CHEST - 2 VIEW COMPARISON:  Chest x-ray dated Apr 05, 2018. FINDINGS: The heart remains borderline enlarged. No obscuration of the left heart border. Normal pulmonary vascularity. New moderate left pleural effusion with lingular and left lower lobe atelectasis. The right lung is clear. No pneumothorax. No acute osseous abnormality. IMPRESSION: 1. New moderate left pleural effusion with lingular and left lower lobe atelectasis. Electronically Signed   By: Titus Dubin M.D.   On: 06/24/2018 16:13   Ct Chest Wo Contrast  Result Date: 07/06/2018 CLINICAL DATA:  Shortness of breath, chest pain. History of prostate cancer. EXAM: CT CHEST WITHOUT CONTRAST TECHNIQUE: Multidetector CT imaging of the chest was performed following the standard protocol without IV contrast. COMPARISON:  Radiograph of same day. FINDINGS: Cardiovascular: No significant vascular findings. Normal heart size. No pericardial effusion. Mediastinum/Nodes: No enlarged mediastinal or axillary lymph nodes. Thyroid gland, trachea, and esophagus demonstrate no significant findings. Lungs/Pleura: No pneumothorax is noted. Large left pleural effusion is noted with  adjacent subsegmental atelectasis of left upper lobe and complete atelectasis of left lower lobe. Mild right posterior basilar subsegmental atelectasis is noted. Upper Abdomen: Fatty infiltration of the liver is noted. There are multiple nodular densities above the left kidney which may be inflammatory in etiology. Possible loculated subdiaphragmatic fluid collection is noted on the left as well. Musculoskeletal: No chest wall mass or suspicious bone lesions identified. IMPRESSION: Large left pleural effusion is noted with associated subsegmental atelectasis of left upper lobe and complete atelectasis of left lower lobe. Fatty infiltration of the liver. Multiple nodular densities are noted above the  left kidney as well as possible loculated left subdiaphragmatic fluid collection. CT scan of the abdomen and pelvis with intravenous contrast is recommended for further evaluation. Electronically Signed   By: Marijo Conception, M.D.   On: 07/06/2018 14:18   Dg Chest Port 1 View  Result Date: 07/09/2018 CLINICAL DATA:  Chest tube.  Shortness of breath.  Pleural effusion. EXAM: PORTABLE CHEST 1 VIEW COMPARISON:  07/08/2018. FINDINGS: Left chest tube, right IJ line stable position. Stable cardiomegaly. Stable diffuse left lung infiltrate and left-sided pleural effusion. No pneumothorax. Chest is unchanged from prior exam. IMPRESSION: 1. Stable line and tube placements including 2 left chest tubes. No pneumothorax. 2. Diffuse left lung infiltrate left-sided pleural effusion. No interim change from prior exam. Electronically Signed   By: Marcello Moores  Register   On: 07/09/2018 09:41   Dg Chest Port 1 View  Result Date: 07/08/2018 CLINICAL DATA:  Empyema of pleural space ,,chest tube present EXAM: PORTABLE CHEST - 1 VIEW COMPARISON:  the previous day's study FINDINGS: Stable left chest tubes, directed towards the apex. No pneumothorax. Residual pleural thickening or layering effusion. Stable atelectasis/consolidation in the  mid and lower left lung. Right lung remains clear. Stable right IJ central line.  Heart size within normal limits. Visualized bones unremarkable. IMPRESSION: Stable appearance since previous day's exam Electronically Signed   By: Lucrezia Europe M.D.   On: 07/08/2018 08:07   Dg Chest Port 1 View  Result Date: 07/07/2018 CLINICAL DATA:  Empyema. Postoperative. EXAM: PORTABLE CHEST 1 VIEW COMPARISON:  Chest radiograph from one day prior. FINDINGS: Two left apical chest tubes are in place. Right internal jugular central venous catheter terminates at the cavoatrial junction. Stable cardiomediastinal silhouette with top-normal heart size. No pneumothorax. No right pleural effusion. Residual mild peripheral left pleural thickening without significant residual left pleural effusion. Improved aeration of the left lung with persistent patchy opacity throughout the left lung. Clear right lung. IMPRESSION: 1. No pneumothorax. 2. Residual mild peripheral left pleural thickening without significant residual left pleural effusion. 3. Improved aeration of the left lung with persistent patchy opacity throughout the left lung. Electronically Signed   By: Ilona Sorrel M.D.   On: 07/07/2018 14:02   Dg Chest Port 1 View  Result Date: 07/06/2018 CLINICAL DATA:  Dyspnea. History of left pleural effusion and left thoracentesis. EXAM: PORTABLE CHEST 1 VIEW COMPARISON:  Chest radiograph 07/05/2018 FINDINGS: Increased densities throughout the left hemithorax suggests increased left pleural fluid. Left pleural effusion appears to be large for size. Limited evaluation of the heart and mediastinum due to the left chest densities. Cannot exclude some mediastinal shift towards the right. No focal disease in the right lung. IMPRESSION: Markedly increased densities throughout the left hemithorax. Findings are suggestive for a large left pleural effusion. Electronically Signed   By: Markus Daft M.D.   On: 07/06/2018 13:17   Dg Chest Port 1  View  Result Date: 07/05/2018 CLINICAL DATA:  Shortness of breath. EXAM: PORTABLE CHEST 1 VIEW COMPARISON:  Chest x-ray dated June 24, 2018. FINDINGS: The left heart border is obscured. Interval enlargement of the left pleural effusion with likely complete collapse of the left lower lobe and partial collapse of the lingula. The right lung is clear. No pneumothorax. No acute osseous abnormality. IMPRESSION: 1. Worsening moderate to large left pleural effusion with left lower lobe and lingular collapse. Electronically Signed   By: Titus Dubin M.D.   On: 07/05/2018 11:38   US Thoracentesis Asp Pleural Space W/img Guide  Result Date: 07/05/2018 INDICATION: LEFT pleural effusion question empyema EXAM: ULTRASOUND GUIDED DIAGNOSTIC AND THERAPEUTIC LEFT THORACENTESIS MEDICATIONS: None. COMPLICATIONS: None immediate. PROCEDURE: Procedure, benefits, and risks of procedure were discussed with patient. Written informed consent for procedure was obtained. Time out protocol followed. Pleural effusion localized by ultrasound at the posterior LEFT hemithorax. Skin prepped and draped in usual sterile fashion. Skin and soft tissues anesthetized with 10 mL of 1% lidocaine. 8 French thoracentesis catheter placed into the LEFT pleural space. 500 mL of cloudy tan-colored fluid aspirated from the LEFT pleural space by syringe and vacuum bottle. Procedure terminated prematurely due to patient discomfort and wanting to lie down. Procedure tolerated well by patient without immediate complication. FINDINGS: A total of approximately 500 mL of LEFT pleural fluid was removed. Samples were sent to the laboratory as requested by the clinical team. IMPRESSION: Successful ultrasound guided LEFT thoracentesis yielding 500 mL of pleural fluid. Electronically Signed   By: Lavonia Dana M.D.   On: 07/05/2018 13:56     LOS: 5 days   Oren Binet, MD  [facility]  If 7PM-7AM, please contact night-coverage  Please page via  [email or web address] [email or web address]-click on MD name and type text message  07/10/2018, 11:03 AM

## 2018-07-10 NOTE — Progress Notes (Addendum)
      [address]       [place] [address]             [phone]      3 Days Post-Op Procedure(s) (LRB): VIDEO ASSISTED THORACOSCOPY (VATS)/DRAINAGE OF EMPYEMA, DECORTICATION (Left)   Subjective:  No complaints. States doing all right  Objective: Vital signs in last 24 hours: Temp:  [99.4 F (37.4 C)-100.9 F (38.3 C)] 99.4 F (37.4 C) (08/28 0329) Pulse Rate:  [80-95] 86 (08/28 0700) Cardiac Rhythm: (P) Normal sinus rhythm (08/28 0806) Resp:  [16-27] 20 (08/28 0700) BP: (119-142)/(74-81) 142/78 (08/28 0700) SpO2:  [93 %-97 %] 95 % (08/28 0700)  Intake/Output from previous day: 08/27 0701 - 08/28 0700 In: 295 [I.V.:195; IV Piggyback:100] Out: 90 [Chest Tube:90]  General appearance: alert, cooperative and no distress Heart: regular rate and rhythm Lungs: diminished breath sounds bibasilar Abdomen: soft, non-tender; bowel sounds normal; no masses,  no organomegaly Extremities: extremities normal, atraumatic, no cyanosis or edema Wound: clean and dry  Lab Results: Recent Labs    07/09/18 0450 07/10/18 0712  WBC 13.1* 11.8*  HGB 10.4* 10.3*  HCT 32.0* 32.0*  PLT 143* 154   BMET:  Recent Labs    07/09/18 0450 07/10/18 0523  NA 136 136  K 3.6 3.4*  CL 106 108  CO2 21* 19*  GLUCOSE 94 89  BUN 12 8  CREATININE 1.23 0.88  CALCIUM 6.8* 6.5*    PT/INR: No results for input(s): LABPROT, INR in the last 72 hours. ABG    Component Value Date/Time   PHART 7.444 07/08/2018 0355   HCO3 19.7 (L) 07/08/2018 0355   ACIDBASEDEF 3.7 (H) 07/08/2018 0355   O2SAT 92.1 07/08/2018 0355   CBG (last 3)  Recent Labs    07/09/18 1208 07/09/18 1624 07/09/18 2145  GLUCAP 93 121* 98    Assessment/Plan: S/P Procedure(s) (LRB): VIDEO ASSISTED THORACOSCOPY (VATS)/DRAINAGE OF EMPYEMA, DECORTICATION (Left)  1. Chest tube- no air leak present, 70 cc output yesterday (level currently at 540)- can possibly d/c one chest tube today 2.  ID- empyema, OR  cultures growing + Klebsiella, Zosyn has been D/C'd not on Rocephin per sensitivities 3. Dispo-  Patient stable, CT output is relatively low, not air leak present, can possibly d/c one chest tube today   LOS: 5 days    Clifford [name] 07/10/2018  D/c anterior chest tube today  I have seen and examined Clifford [name] and agree with the above assessment  and plan.  Clifford Isaac MD Beeper [phone] Office [phone] 07/10/2018 6:16 PM

## 2018-07-10 NOTE — Progress Notes (Signed)
Anterior chest tube removed per order, no complications. Will continue to assess vitals and lung sounds. No air leak present.

## 2018-07-11 ENCOUNTER — Inpatient Hospital Stay (HOSPITAL_COMMUNITY): Payer: Medicare Other

## 2018-07-11 LAB — BASIC METABOLIC PANEL
ANION GAP: 8 (ref 5–15)
BUN: 6 mg/dL — ABNORMAL LOW (ref 8–23)
CHLORIDE: 104 mmol/L (ref 98–111)
CO2: 22 mmol/L (ref 22–32)
Calcium: 7.3 mg/dL — ABNORMAL LOW (ref 8.9–10.3)
Creatinine, Ser: 0.87 mg/dL (ref 0.61–1.24)
GFR calc Af Amer: >60 mL/min (ref >60)
GLUCOSE: 105 mg/dL — AB (ref 70–99)
Potassium: 3.4 mmol/L — ABNORMAL LOW (ref 3.5–5.1)
Sodium: 134 mmol/L — ABNORMAL LOW (ref 135–145)

## 2018-07-11 LAB — GLUCOSE, CAPILLARY
GLUCOSE-CAPILLARY: 110 mg/dL — AB (ref 70–99)
Glucose-Capillary: 98 mg/dL (ref 70–99)
Glucose-Capillary: 102 mg/dL — ABNORMAL HIGH (ref 70–99)
Glucose-Capillary: 90 mg/dL (ref 70–99)

## 2018-07-11 LAB — BODY FLUID CULTURE: CULTURE: NO GROWTH

## 2018-07-11 MED ORDER — POTASSIUM CHLORIDE CRYS ER 20 MEQ PO TBCR
40.0000 meq | EXTENDED_RELEASE_TABLET | Freq: Once | ORAL | Status: AC
  Administered 2018-07-11: 40 meq via ORAL
  Filled 2018-07-11: qty 2

## 2018-07-11 MED ORDER — HYDROMORPHONE HCL 1 MG/ML IJ SOLN: 1.0000 mg | INTRAMUSCULAR | Status: DC | PRN

## 2018-07-11 NOTE — Progress Notes (Addendum)
      [address]       [place] [address]             [phone]      4 Days Post-Op Procedure(s) (LRB): VIDEO ASSISTED THORACOSCOPY (VATS)/DRAINAGE OF EMPYEMA, DECORTICATION (Left)   Subjective:  No new complaints.  States doing okay.   Objective: Vital signs in last 24 hours: Temp:  [98.4 F (36.9 C)-100.1 F (37.8 C)] 98.4 F (36.9 C) (08/29 0758) Pulse Rate:  [74-87] 81 (08/29 0758) Cardiac Rhythm: Normal sinus rhythm (08/29 0700) Resp:  [18-26] 18 (08/29 0758) BP: (127-155)/(76-96) 134/81 (08/29 0758) SpO2:  [95 %-100 %] 98 % (08/29 0758)  Intake/Output from previous day: 08/28 0701 - 08/29 0700 In: 221.5 [I.V.:121.5; IV Piggyback:100] Out: 405 [Urine:375; Chest Tube:30]  General appearance: alert, cooperative and no distress Heart: regular rate and rhythm Lungs: diminished breath sounds bibasilar Abdomen: soft, non-tender; bowel sounds normal; no masses,  no organomegaly Wound: clean and dry  Lab Results: Recent Labs    07/09/18 0450 07/10/18 0712  WBC 13.1* 11.8*  HGB 10.4* 10.3*  HCT 32.0* 32.0*  PLT 143* 154   BMET:  Recent Labs    07/10/18 0523 07/11/18 0420  NA 136 134*  K 3.4* 3.4*  CL 108 104  CO2 19* 22  GLUCOSE 89 105*  BUN 8 6*  CREATININE 0.88 0.87  CALCIUM 6.5* 7.3*    PT/INR: No results for input(s): LABPROT, INR in the last 72 hours. ABG    Component Value Date/Time   PHART 7.444 07/08/2018 0355   HCO3 19.7 (L) 07/08/2018 0355   ACIDBASEDEF 3.7 (H) 07/08/2018 0355   O2SAT 92.1 07/08/2018 0355   CBG (last 3)  Recent Labs    07/10/18 1707 07/10/18 2134 07/11/18 0800  GLUCAP 91 115* 98    Assessment/Plan: S/P Procedure(s) (LRB): VIDEO ASSISTED THORACOSCOPY (VATS)/DRAINAGE OF EMPYEMA, DECORTICATION (Left)  1. Chest tube- no air leak present, 30 cc output yesterday (570) there is a small pneumothorax on the left after chest tube removal- will discuss chest tube removal with staff for today vs.  Tomorrow 2. ID- + Klebsiella Pneumoniae Empyema- on Rocephin 3. Dispo- patient stable, care per medicine, possibly d/c chest tube today   LOS: 6 days    Ellwood Handler 07/11/2018  Dg Chest Port 1 View  Result Date: 07/10/2018 CLINICAL DATA:  Pleural effusion. Shortness of breath. Chest tube in place. EXAM: PORTABLE CHEST 1 VIEW COMPARISON:  07/09/2018 FINDINGS: Right jugular catheter and 2 left chest tubes are unchanged. The cardiac silhouette remains mildly enlarged. Extensive left lung airspace opacity, including dense consolidation in the left lung base, and residual left pleural effusion and/or pleural thickening do not appear significantly changed. The right lung remains clear. No pneumothorax is identified. IMPRESSION: 1. Unchanged left lung consolidation and left pleural effusion and/or pleural thickening. 2. No pneumothorax. Electronically Signed   By: Logan Bores M.D.   On: 07/10/2018 12:29   Question small apical ptx, no air leak, will remove chest tube today  I have seen and examined Clifford [name] and agree with the above assessment  and plan.  Grace Isaac MD Beeper [phone] Office [phone] 07/11/2018 11:45 AM

## 2018-07-11 NOTE — Progress Notes (Signed)
PROGRESS NOTE        PATIENT DETAILS Name: Clifford [name] Age: 67 y.o. Sex: male Date of Birth: [birth year] Admit Date: 07/05/2018 Admitting Physician Estela Leonie Green, MD FHL:[id number], Lehman Prom, FNP  Brief Narrative: Patient is a 67 y.o. male history of hypertension, EtOH use he admitted to any pain hospital for pneumonia, he was found on further evaluation to have empyema.  Patient was transferred to [facility], underwent VATS along with bilateral chest tube placement on 8/25.  See below for further details  Subjective: Lying comfortably in bed-no chest pain or shortness of breath.  Assessment/Plan: Sepsis secondary to aspiration pneumonia and empyema with acute hypoxic respiratory failure: Sepsis pathophysiology has resolved, underwent VATS on 8/25 with bilateral chest tube placement.  Intraoperative cultures positive for Klebsiella, no longer on Zosyn now on Rocephin.  Suspect we could transition to Keflex on discharge.  One chest tube was removed on 8/28, cardiothoracic surgery following and contemplating removal of second chest tube today.  We will go ahead and stop fentanyl PCA-start oxycodone for moderate pain, and IV Dilaudid for severe pain.  Have asked nursing staff to discontinue central line if patient has adequate peripheral venous access.  Obtain PT evaluation as well.  Acute kidney injury: Hemodynamically mediated in the setting of sepsis, resolved with supportive care.    Hypokalemia: Replete and recheck  EtOH use: Managed with Ativan per CIWA protocol, currently out of the window for withdrawal symptoms.  Counseled extensively.  Anemia: Secondary to critical illness, transfused 2 units of PRBC on 8/26.  Hemoglobin stable, follow CBC periodically.    Hypertension: Blood pressure controlled, amlodipine started on 8/28.  Follow and optimize accordingly.   DVT Prophylaxis: Prophylactic Lovenox   Code Status: Full code    Family Communication: None at bedside  Disposition Plan: Remain inpatient-home when cleared by cardiothoracic surgery  Antimicrobial agents: Anti-infectives (From admission, onward)   Start     Dose/Rate Route Frequency Ordered Stop   07/09/18 1245  cefTRIAXone (ROCEPHIN) 2 g in sodium chloride 0.9 % 100 mL IVPB     2 g 200 mL/hr over 30 Minutes Intravenous Every 24 hours 07/09/18 1230     07/09/18 1230  cefTRIAXone (ROCEPHIN) 1 g in sodium chloride 0.9 % 100 mL IVPB  Status:  Discontinued     1 g 200 mL/hr over 30 Minutes Intravenous Every 24 hours 07/09/18 1226 07/09/18 1230   07/07/18 0600  vancomycin (VANCOCIN) IVPB 1000 mg/200 mL premix     1,000 mg 200 mL/hr over 60 Minutes Intravenous On call to O.R. 07/06/18 2019 07/07/18 1043   07/06/18 1800  piperacillin-tazobactam (ZOSYN) IVPB 3.375 g  Status:  Discontinued     3.375 g 12.5 mL/hr over 240 Minutes Intravenous Every 8 hours 07/06/18 1658 07/09/18 1226   07/06/18 1300  azithromycin (ZITHROMAX) 500 mg in sodium chloride 0.9 % 250 mL IVPB  Status:  Discontinued     500 mg 250 mL/hr over 60 Minutes Intravenous Every 24 hours 07/05/18 1852 07/06/18 1225   07/05/18 2000  Ampicillin-Sulbactam (UNASYN) 3 g in sodium chloride 0.9 % 100 mL IVPB  Status:  Discontinued     3 g 200 mL/hr over 30 Minutes Intravenous Every 8 hours 07/05/18 1913 07/06/18 1650   07/05/18 1200  cefTRIAXone (ROCEPHIN) 2 g in sodium chloride 0.9 % 100 mL  IVPB  Status:  Discontinued     2 g 200 mL/hr over 30 Minutes Intravenous Every 24 hours 07/05/18 1149 07/05/18 1844   07/05/18 1200  azithromycin (ZITHROMAX) 500 mg in sodium chloride 0.9 % 250 mL IVPB  Status:  Discontinued     500 mg 250 mL/hr over 60 Minutes Intravenous Every 24 hours 07/05/18 1149 07/05/18 1844      Procedures: 8/25>>VATS  CONSULTS:  CTVS  Time spent: 25- minutes-Greater than 50% of this time was spent in counseling, explanation of diagnosis, planning of further management,  and coordination of care.  MEDICATIONS: Scheduled Meds: . sodium chloride   Intravenous Once  . amLODipine  5 mg Oral Daily  . B-complex with vitamin C  1 tablet Oral q morning - 10a  . bisacodyl  10 mg Oral Daily  . enoxaparin (LOVENOX) injection  40 mg Subcutaneous Q24H  . folic acid  1 mg Oral Daily  . insulin aspart  0-24 Units Subcutaneous TID AC & HS  . multivitamin with minerals  1 tablet Oral Daily  . pantoprazole  40 mg Oral Daily  . senna-docusate  1 tablet Oral QHS  . thiamine  100 mg Oral Daily   Or  . thiamine  100 mg Intravenous Daily   Continuous Infusions: . sodium chloride 10 mL/hr at 07/10/18 1624  . cefTRIAXone (ROCEPHIN)  IV Stopped (07/10/18 1255)   PRN Meds:.sodium chloride, acetaminophen **OR** acetaminophen, HYDROmorphone (DILAUDID) injection, levalbuterol, morphine injection, ondansetron **OR** ondansetron (ZOFRAN) IV, oxyCODONE, senna-docusate, traMADol   PHYSICAL EXAM: Vital signs: Vitals:   07/11/18 0344 07/11/18 0421 07/11/18 0739 07/11/18 0758  BP:    134/81  Pulse:    81  Resp:  (!) 21 (!) 22 18  Temp: 100 F (37.8 C)   98.4 F (36.9 C)  TempSrc: Oral   Oral  SpO2:  96% 99% 98%  Weight:      Height:       Filed Weights   07/05/18 1133 07/05/18 1502 07/09/18 0500  Weight: 63.5 kg 65.4 kg 69.9 kg   Body mass index is 25.64 kg/m.   General appearance:Awake, alert, not in any distress.  Eyes:no scleral icterus. HEENT: Atraumatic and Normocephalic Neck: supple, no JVD. Resp:Good air entry bilaterally,no rales or rhonchi CVS: S1 S2 regular, no murmurs.  GI: Bowel sounds present, Non tender and not distended with no gaurding, rigidity or rebound. Extremities: B/L Lower Ext shows no edema, both legs are warm to touch Neurology:  Non focal Psychiatric: Normal judgment and insight. Normal mood. Musculoskeletal:No digital cyanosis Skin:No Rash, warm and dry Wounds:N/A  I have personally reviewed following labs and imaging  studies  LABORATORY DATA: CBC: Recent Labs  Lab 07/05/18 1156  07/06/18 2131 07/07/18 0317 07/08/18 0359 07/09/18 0450 07/10/18 0712  WBC 14.3*   < > 14.4* 12.4* 13.2* 13.1* 11.8*  NEUTROABS 12.1*  --   --  10.9*  --   --   --   HGB 10.2*   < > 7.9* 7.3* 7.2* 10.4* 10.3*  HCT 32.7*   < > 25.3* 23.5* 23.6* 32.0* 32.0*  MCV 91.1   < > 90.4 90.7 90.1 88.6 88.9  PLT 370   < > 168 159 170 143* 154   < > = values in this interval not displayed.    Basic Metabolic Panel: Recent Labs  Lab 07/07/18 0317 07/08/18 0359 07/09/18 0450 07/10/18 0523 07/11/18 0420  NA 135 133* 136 136 134*  K 4.1 3.8 3.6 3.4*  3.4*  CL 107 107 106 108 104  CO2 19* 19* 21* 19* 22  GLUCOSE 120* 159* 94 89 105*  BUN 24* 19 12 8  6*  CREATININE 1.73* 1.42* 1.23 0.88 0.87  CALCIUM 6.7* 6.5* 6.8* 6.5* 7.3*  MG  --  1.2*  --   --   --   PHOS  --  3.2  --   --   --     GFR: Estimated Creatinine Clearance: 71.7 mL/min (by C-G formula based on SCr of 0.87 mg/dL).  Liver Function Tests: Recent Labs  Lab 07/05/18 1156 07/06/18 0510 07/06/18 2131 07/08/18 0359 07/09/18 0450  AST 57* 35 23 18 25   ALT 19 17 14 11 12   ALKPHOS 87 48 51 55 65  BILITOT 1.2 0.6 0.9 0.6 1.3*  PROT 7.4 5.6* 4.9* 4.3* 4.6*  ALBUMIN 3.2* 2.3* 1.9* 1.5* 1.6*   Recent Labs  Lab 07/06/18 1723  LIPASE 44   No results for input(s): AMMONIA in the last 168 hours.  Coagulation Profile: Recent Labs  Lab 07/05/18 1156 07/06/18 2131  INR 1.10 1.42    Cardiac Enzymes: No results for input(s): CKTOTAL, CKMB, CKMBINDEX, TROPONINI in the last 168 hours.  BNP (last 3 results) No results for input(s): PROBNP in the last 8760 hours.  HbA1C: No results for input(s): HGBA1C in the last 72 hours.  CBG: Recent Labs  Lab 07/10/18 0834 07/10/18 1127 07/10/18 1707 07/10/18 2134 07/11/18 0800  GLUCAP 88 130* 91 115* 98    Lipid Profile: No results for input(s): CHOL, HDL, LDLCALC, TRIG, CHOLHDL, LDLDIRECT in the last 72  hours.  Thyroid Function Tests: No results for input(s): TSH, T4TOTAL, FREET4, T3FREE, THYROIDAB in the last 72 hours.  Anemia Panel: No results for input(s): VITAMINB12, FOLATE, FERRITIN, TIBC, IRON, RETICCTPCT in the last 72 hours.  Urine analysis:    Component Value Date/Time   COLORURINE AMBER (A) 07/06/2018 2048   APPEARANCEUR HAZY (A) 07/06/2018 2048   LABSPEC 1.021 07/06/2018 2048   PHURINE 5.0 07/06/2018 2048   GLUCOSEU NEGATIVE 07/06/2018 2048   HGBUR NEGATIVE 07/06/2018 2048   BILIRUBINUR NEGATIVE 07/06/2018 2048   KETONESUR NEGATIVE 07/06/2018 2048   PROTEINUR NEGATIVE 07/06/2018 2048   NITRITE NEGATIVE 07/06/2018 2048   LEUKOCYTESUR NEGATIVE 07/06/2018 2048    Sepsis Labs: Lactic Acid, Venous    Component Value Date/Time   LATICACIDVEN 1.4 07/07/2018 1555    MICROBIOLOGY: Recent Results (from the past 240 hour(s))  Culture, blood (Routine x 2)     Status: None   Collection Time: 07/05/18 11:56 AM  Result Value Ref Range Status   Specimen Description BLOOD LEFT ARM  Final   Special Requests   Final    BOTTLES DRAWN AEROBIC AND ANAEROBIC Blood Culture adequate volume   Culture   Final    NO GROWTH 5 DAYS Performed at [facility], [address] [place], [place] [address]    Report Status 07/10/2018 FINAL  Final  Culture, blood (Routine x 2)     Status: None   Collection Time: 07/05/18 12:02 PM  Result Value Ref Range Status   Specimen Description BLOOD RIGHT HAND  Final   Special Requests   Final    BOTTLES DRAWN AEROBIC AND ANAEROBIC Blood Culture results may not be optimal due to an inadequate volume of blood received in culture bottles   Culture   Final    NO GROWTH 5 DAYS Performed at [facility], [address], [place], [place] [address]  Report Status 07/10/2018 FINAL  Final  Gram stain     Status: None   Collection Time: 07/05/18  1:35 PM  Result Value Ref Range Status   Specimen Description PLEURAL  Final   Special Requests NONE  Final    Gram Stain   Final    NO ORGANISMS SEEN WBC PRESENT, PREDOMINANTLY PMN CYTOSPIN SMEAR Performed at [facility], [address] [place], [place] [address]    Report Status 07/05/2018 FINAL  Final  Culture, body fluid-bottle     Status: None   Collection Time: 07/05/18  1:35 PM  Result Value Ref Range Status   Specimen Description PLEURAL  Final   Special Requests BOTTLES DRAWN AEROBIC AND ANAEROBIC 10CC  Final   Culture   Final    NO GROWTH 5 DAYS Performed at [facility], [address], [place], [place] [address]    Report Status 07/10/2018 FINAL  Final  Surgical PCR screen     Status: None   Collection Time: 07/07/18  8:42 AM  Result Value Ref Range Status   MRSA, PCR NEGATIVE NEGATIVE Final   Staphylococcus aureus NEGATIVE NEGATIVE Final    Comment: (NOTE) The Xpert SA Assay (FDA approved for NASAL specimens in patients 40 years of age and older), is one component of a comprehensive surveillance program. It is not intended to diagnose infection nor to guide or monitor treatment. Performed at [facility], [place] [address], [place], [place] [address]   Anaerobic culture     Status: None (Preliminary result)   Collection Time: 07/07/18 10:37 AM  Result Value Ref Range Status   Specimen Description FLUID PLEURAL LEFT  Final   Special Requests   Final    NONE Performed at [facility], [place] [address], [place], [place] [address]    Gram Stain PENDING  Incomplete   Culture   Final    NO ANAEROBES ISOLATED; CULTURE IN PROGRESS FOR 5 DAYS   Report Status PENDING  Incomplete  Body fluid culture     Status: None   Collection Time: 07/07/18 10:37 AM  Result Value Ref Range Status   Specimen Description PLEURAL LEFT  Final   Special Requests NONE  Final   Gram Stain   Final    MODERATE WBC PRESENT, PREDOMINANTLY PMN NO ORGANISMS SEEN    Culture   Final    NO GROWTH 3 DAYS Performed at [facility], [place] [address], [place], [place] [address]    Report  Status 07/11/2018 FINAL  Final  Fungus Culture With Stain     Status: None (Preliminary result)   Collection Time: 07/07/18 10:37 AM  Result Value Ref Range Status   Fungus Stain Final report  Final    Comment: (NOTE) Performed At: [facility] [place], [place] [id number] Rush Farmer MD [id number]    Fungus (Mycology) Culture PENDING  Incomplete   Fungal Source FLUID  Final    Comment: PLEURAL LEFT Performed at [facility], [place] [address], [place], [place] [address]   Acid Fast Smear (AFB)     Status: None   Collection Time: 07/07/18 10:37 AM  Result Value Ref Range Status   AFB Specimen Processing Concentration  Final   Acid Fast Smear Negative  Final    Comment: (NOTE) Performed At: [facility] [place], [place] [id number] Rush Farmer MD [id number]    Source (AFB) FLUID  Final    Comment: PLEURAL  LEFT Performed at [facility], [place] [address], [place], [place] [address]   Fungus Culture Result     Status: None   Collection Time: 07/07/18 10:37 AM  Result Value Ref Range Status   Result 1 Comment  Final    Comment: (NOTE) KOH/Calcofluor preparation:  no fungus observed. Performed At: [facility] [place], [place] [id number] Rush Farmer MD [id number]   Fungus Culture With Stain     Status: None (Preliminary result)   Collection Time: 07/07/18 12:08 PM  Result Value Ref Range Status   Fungus Stain Final report  Final    Comment: (NOTE) Performed At: [facility] [place], [place] [id number] Rush Farmer MD [id number]    Fungus (Mycology) Culture PENDING  Incomplete   Fungal Source TISSUE  Final    Comment: PLEURAL Performed at [facility], [place] [address] [place], [place] [address]   Aerobic/Anaerobic Culture (surgical/deep wound)     Status: None (Preliminary result)   Collection Time: 07/07/18 12:08 PM  Result Value Ref Range  Status   Specimen Description TISSUE PLEURAL  Final   Special Requests NONE  Final   Gram Stain   Final    MODERATE WBC PRESENT, PREDOMINANTLY PMN NO ORGANISMS SEEN    Culture   Final    RARE KLEBSIELLA PNEUMONIAE NO ANAEROBES ISOLATED; CULTURE IN PROGRESS FOR 5 DAYS CRITICAL RESULT CALLED TO, READ BACK BY AND VERIFIED WITH: JOHNSON RN AT 1200 ON [id number] BY SJW Performed at [facility], [place] [address], [place], [place] [address]    Report Status PENDING  Incomplete   Organism ID, Bacteria KLEBSIELLA PNEUMONIAE  Final      Susceptibility   Klebsiella pneumoniae - MIC*    AMPICILLIN >=32 RESISTANT Resistant     CEFAZOLIN 16 SENSITIVE Sensitive     CEFEPIME <=1 SENSITIVE Sensitive     CEFTAZIDIME <=1 SENSITIVE Sensitive     CEFTRIAXONE <=1 SENSITIVE Sensitive     CIPROFLOXACIN <=0.25 SENSITIVE Sensitive     GENTAMICIN <=1 SENSITIVE Sensitive     IMIPENEM <=0.25 SENSITIVE Sensitive     TRIMETH/SULFA <=20 SENSITIVE Sensitive     AMPICILLIN/SULBACTAM >=32 RESISTANT Resistant     PIP/TAZO 32 INTERMEDIATE Intermediate     Extended ESBL NEGATIVE Sensitive     * RARE KLEBSIELLA PNEUMONIAE  Acid Fast Smear (AFB)     Status: None   Collection Time: 07/07/18 12:08 PM  Result Value Ref Range Status   AFB Specimen Processing Concentration  Final   Acid Fast Smear Negative  Final    Comment: (NOTE) Performed At: [facility] [address] [place], [place] [id number] Rush Farmer MD [id number]    Source (AFB) TISSUE  Final    Comment: PLEURAL Performed at [facility], [place] [address] [place],  [address]   Fungus Culture Result     Status: None   Collection Time: 07/07/18 12:08 PM  Result Value Ref Range Status   Result 1 Comment  Final    Comment: (NOTE) KOH/Calcofluor preparation:  no fungus observed. Performed At: [facility] [place], [place] [id number] Rush Farmer MD QM:[id number]     [id number] STUDIES/RESULTS: Dg  Chest 1 View  Result Date: 07/05/2018 CLINICAL DATA:  Post LEFT thoracentesis EXAM: CHEST  1 VIEW COMPARISON:  Earlier study 07/05/2018 FINDINGS: No pneumothorax following LEFT thoracentesis. Persistent LEFT pleural effusion and basilar atelectasis accentuated by expiratory technique. RIGHT lung clear.  Bones demineralized. IMPRESSION: No pneumothorax following LEFT thoracentesis. Electronically Signed   By: Lavonia Dana M.D.   On: 07/05/2018 14:25   Dg Chest 2 View  Result Date: 06/24/2018 CLINICAL DATA:  Cough and weakness. EXAM: CHEST - 2 VIEW COMPARISON:  Chest x-ray dated Apr 05, 2018. FINDINGS: The heart remains borderline enlarged. No obscuration of the left heart border. Normal pulmonary vascularity. New moderate left pleural effusion with lingular and left lower lobe atelectasis. The right lung is clear. No pneumothorax. No acute osseous abnormality. IMPRESSION: 1. New moderate left pleural effusion with lingular and left lower lobe atelectasis. Electronically Signed   By: Titus Dubin M.D.   On: 06/24/2018 16:13   Ct Chest Wo Contrast  Result Date: 07/06/2018 CLINICAL DATA:  Shortness of breath, chest pain. History of prostate cancer. EXAM: CT CHEST WITHOUT CONTRAST TECHNIQUE: Multidetector CT imaging of the chest was performed following the standard protocol without IV contrast. COMPARISON:  Radiograph of same day. FINDINGS: Cardiovascular: No significant vascular findings. Normal heart size. No pericardial effusion. Mediastinum/Nodes: No enlarged mediastinal or axillary lymph nodes. Thyroid gland, trachea, and esophagus demonstrate no significant findings. Lungs/Pleura: No pneumothorax is noted. Large left pleural effusion is noted with adjacent subsegmental atelectasis of left upper lobe and complete atelectasis of left lower lobe. Mild right posterior basilar subsegmental atelectasis is noted. Upper Abdomen: Fatty infiltration of the liver is noted. There are multiple nodular densities above  the left kidney which may be inflammatory in etiology. Possible loculated subdiaphragmatic fluid collection is noted on the left as well. Musculoskeletal: No chest wall mass or suspicious bone lesions identified. IMPRESSION: Large left pleural effusion is noted with associated subsegmental atelectasis of left upper lobe and complete atelectasis of left lower lobe. Fatty infiltration of the liver. Multiple nodular densities are noted above the left kidney as well as possible loculated left subdiaphragmatic fluid collection. CT scan of the abdomen and pelvis with intravenous contrast is recommended for further evaluation. Electronically Signed   By: Marijo Conception, M.D.   On: 07/06/2018 14:18   Dg Chest Port 1 View  Result Date: 07/10/2018 CLINICAL DATA:  Pleural effusion. Shortness of breath. Chest tube in place. EXAM: PORTABLE CHEST 1 VIEW COMPARISON:  07/09/2018 FINDINGS: Right jugular catheter and 2 left chest tubes are unchanged. The cardiac silhouette remains mildly enlarged. Extensive left lung airspace opacity, including dense consolidation in the left lung base, and residual left pleural effusion and/or pleural thickening do not appear significantly changed. The right lung remains clear. No pneumothorax is identified. IMPRESSION: 1. Unchanged left lung consolidation and left pleural effusion and/or pleural thickening. 2. No pneumothorax. Electronically Signed   By: Logan Bores M.D.   On: 07/10/2018 12:29   Dg Chest Port 1 View  Result Date: 07/09/2018 CLINICAL DATA:  Chest tube.  Shortness of breath.  Pleural effusion. EXAM: PORTABLE CHEST 1 VIEW COMPARISON:  07/08/2018. FINDINGS: Left chest tube, right IJ line stable position. Stable cardiomegaly. Stable diffuse left lung infiltrate and left-sided pleural effusion. No pneumothorax. Chest is unchanged from prior exam. IMPRESSION: 1. Stable line and tube placements including 2 left chest tubes. No pneumothorax. 2. Diffuse left lung infiltrate  left-sided pleural effusion. No interim change from prior exam. Electronically Signed   By: Marcello Moores  Register   On: 07/09/2018 09:41   Dg Chest Port 1 View  Result Date: 07/08/2018 CLINICAL DATA:  Empyema of pleural space ,,chest tube present EXAM: PORTABLE CHEST - 1 VIEW COMPARISON:  the previous day's study FINDINGS:  Stable left chest tubes, directed towards the apex. No pneumothorax. Residual pleural thickening or layering effusion. Stable atelectasis/consolidation in the mid and lower left lung. Right lung remains clear. Stable right IJ central line.  Heart size within normal limits. Visualized bones unremarkable. IMPRESSION: Stable appearance since previous day's exam Electronically Signed   By: Lucrezia Europe M.D.   On: 07/08/2018 08:07   Dg Chest Port 1 View  Result Date: 07/07/2018 CLINICAL DATA:  Empyema. Postoperative. EXAM: PORTABLE CHEST 1 VIEW COMPARISON:  Chest radiograph from one day prior. FINDINGS: Two left apical chest tubes are in place. Right internal jugular central venous catheter terminates at the cavoatrial junction. Stable cardiomediastinal silhouette with top-normal heart size. No pneumothorax. No right pleural effusion. Residual mild peripheral left pleural thickening without significant residual left pleural effusion. Improved aeration of the left lung with persistent patchy opacity throughout the left lung. Clear right lung. IMPRESSION: 1. No pneumothorax. 2. Residual mild peripheral left pleural thickening without significant residual left pleural effusion. 3. Improved aeration of the left lung with persistent patchy opacity throughout the left lung. Electronically Signed   By: Ilona Sorrel M.D.   On: 07/07/2018 14:02   Dg Chest Port 1 View  Result Date: 07/06/2018 CLINICAL DATA:  Dyspnea. History of left pleural effusion and left thoracentesis. EXAM: PORTABLE CHEST 1 VIEW COMPARISON:  Chest radiograph 07/05/2018 FINDINGS: Increased densities throughout the left hemithorax  suggests increased left pleural fluid. Left pleural effusion appears to be large for size. Limited evaluation of the heart and mediastinum due to the left chest densities. Cannot exclude some mediastinal shift towards the right. No focal disease in the right lung. IMPRESSION: Markedly increased densities throughout the left hemithorax. Findings are suggestive for a large left pleural effusion. Electronically Signed   By: Markus Daft M.D.   On: 07/06/2018 13:17   Dg Chest Port 1 View  Result Date: 07/05/2018 CLINICAL DATA:  Shortness of breath. EXAM: PORTABLE CHEST 1 VIEW COMPARISON:  Chest x-ray dated June 24, 2018. FINDINGS: The left heart border is obscured. Interval enlargement of the left pleural effusion with likely complete collapse of the left lower lobe and partial collapse of the lingula. The right lung is clear. No pneumothorax. No acute osseous abnormality. IMPRESSION: 1. Worsening moderate to large left pleural effusion with left lower lobe and lingular collapse. Electronically Signed   By: Titus Dubin M.D.   On: 07/05/2018 11:38   US Thoracentesis Asp Pleural Space W/img Guide  Result Date: 07/05/2018 INDICATION: LEFT pleural effusion question empyema EXAM: ULTRASOUND GUIDED DIAGNOSTIC AND THERAPEUTIC LEFT THORACENTESIS MEDICATIONS: None. COMPLICATIONS: None immediate. PROCEDURE: Procedure, benefits, and risks of procedure were discussed with patient. Written informed consent for procedure was obtained. Time out protocol followed. Pleural effusion localized by ultrasound at the posterior LEFT hemithorax. Skin prepped and draped in usual sterile fashion. Skin and soft tissues anesthetized with 10 mL of 1% lidocaine. 8 French thoracentesis catheter placed into the LEFT pleural space. 500 mL of cloudy tan-colored fluid aspirated from the LEFT pleural space by syringe and vacuum bottle. Procedure terminated prematurely due to patient discomfort and wanting to lie down. Procedure tolerated well  by patient without immediate complication. FINDINGS: A total of approximately 500 mL of LEFT pleural fluid was removed. Samples were sent to the laboratory as requested by the clinical team. IMPRESSION: Successful ultrasound guided LEFT thoracentesis yielding 500 mL of pleural fluid. Electronically Signed   By: Lavonia Dana M.D.   On: 07/05/2018 13:56  LOS: 6 days   Oren Binet, MD  [facility]  If 7PM-7AM, please contact night-coverage  Please page via [email or web address] [email or web address]-click on MD name and type text message  07/11/2018, 10:46 AM

## 2018-07-11 NOTE — Progress Notes (Signed)
Wasted 9 mL of remaining Fentanyl from PCA syringe after PCA order was discontinued. Mel Turner, RN, witnessed waste.

## 2018-07-11 NOTE — Progress Notes (Signed)
Made Clifford [name] aware of CVC removal order and he stated he will remove the central line.  [name]

## 2018-07-11 NOTE — Evaluation (Signed)
Physical Therapy Evaluation Patient Details Name: Clifford [name] MRN: [id number] DOB: October 09, 1951 Today's Date: 07/11/2018   History of Present Illness  67 yo transferred from [facility] 8/23 with sepsis secondary to empyema s/p VATS with chest tube 8/25. PMhx: ETOh abuse, HTN, HLD, GIB, prostate CA, seizure  Clinical Impression  Pt pleasant on arrival, visiting w/ wife. Pt able to ambulate 110 ft holding IV pole w/ min guard for steady and lines. Pt with SpO2 drop to 84% on pulse ox, pt cued for pursed lip breathing with recovery to 88%. Pt returned to room with SpO2 back to 91% <1 minute. Pt reports this is significant decrease in function from one month prior, with wife confirming. Pt deficits listed below (see PT problem list). Pt will benefit from acute skilled therapy to address deficits to increase activity tolerance, functional mobility, indepedence and decrease fall risk. Pt encouraged to continue ambulation w/ nursing staff.      Follow Up Recommendations Outpatient PT;Supervision for mobility/OOB    Equipment Recommendations  3in1 (PT)    Recommendations for Other Services       Precautions / Restrictions Precautions Precautions: None Restrictions Weight Bearing Restrictions: No      Mobility  Bed Mobility Overal bed mobility: Needs Assistance Bed Mobility: Supine to Sit     Supine to sit: Min guard     General bed mobility comments: cues for sequence with increased time  Transfers Overall transfer level: Needs assistance Equipment used: 1 person hand held assist Transfers: Sit to/from Stand Sit to Stand: Min assist         General transfer comment: min a for hha and steady balance   Ambulation/Gait Ambulation/Gait assistance: Min guard Gait Distance (Feet): 110 Feet Assistive device: IV Pole Gait Pattern/deviations: Step-through pattern;Decreased stride length;Drifts right/left;Narrow base of support Gait velocity: decreased  Gait velocity interpretation:  1.31 - 2.62 ft/sec, indicative of limited community ambulator General Gait Details: pt felt unsteady and weak.  Using IV pole pt felt improved balance, min guard +1 for lines and chest tube management   Stairs            Wheelchair Mobility    Modified Rankin (Stroke Patients Only)       Balance Overall balance assessment: Modified Independent Sitting-balance support: Feet supported;Single extremity supported Sitting balance-Leahy Scale: Fair Sitting balance - Comments: able to sit without bil support    Standing balance support: Single extremity supported;During functional activity Standing balance-Leahy Scale: Fair Standing balance comment: pt with use of IV pole by choice, but able to stand 5 seconds without support                              Pertinent Vitals/Pain Pain Assessment: 0-10 Pain Score: 6  Pain Location: chest tube insertion site Pain Descriptors / Indicators: Sore Pain Intervention(s): Limited activity within patient's tolerance    Home Living Family/patient expects to be discharged to:: Private residence Living Arrangements: Spouse/significant other Available Help at Discharge: Family;Available 24 hours/day Type of Home: House Home Access: Stairs to enter Entrance Stairs-Rails: Can reach both Entrance Stairs-Number of Steps: 4 Home Layout: Laundry or work area in basement;Two level;Able to live on main level with bedroom/bathroom Home Equipment: Bedside commode;Cane - single point;Walker - 2 wheels      Prior Function Level of Independence: Independent         Comments: wife reports some assist needed at times in the last month, pt  retired, 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 mows grass, drives      Hand Dominance        Extremity/Trunk Assessment   Upper Extremity Assessment Upper Extremity Assessment: Generalized weakness    Lower Extremity Assessment Lower Extremity Assessment: Overall WFL for tasks assessed    Cervical / Trunk  Assessment Cervical / Trunk Assessment: Normal  Communication   Communication: No difficulties  Cognition Arousal/Alertness: Awake/alert Behavior During Therapy: WFL for tasks assessed/performed Overall Cognitive Status: Within Functional Limits for tasks assessed                                        General Comments      Exercises     Assessment/Plan    PT Assessment Patient needs continued PT services  PT Problem List Decreased strength;Decreased mobility;Decreased safety awareness;Decreased coordination;Decreased activity tolerance;Cardiopulmonary status limiting activity;Decreased balance;Decreased knowledge of use of DME       PT Treatment Interventions DME instruction;Therapeutic activities;Gait training;Therapeutic exercise;Patient/family education;Stair training;Balance training;Functional mobility training    PT Goals (Current goals can be found in the Care Plan section)  Acute Rehab PT Goals PT Goal Formulation: With patient/family Time For Goal Achievement: 07/11/18 Potential to Achieve Goals: Good    Frequency Min 3X/week   Barriers to discharge        Co-evaluation               AM-PAC PT "6 Clicks" Daily Activity  Outcome Measure Difficulty turning over in bed (including adjusting bedclothes, sheets and blankets)?: A Lot Difficulty moving from lying on back to sitting on the side of the bed? : A Lot Difficulty sitting down on and standing up from a chair with arms (e.g., wheelchair, bedside commode, etc,.)?: Unable Help needed moving to and from a bed to chair (including a wheelchair)?: A Little Help needed walking in hospital room?: A Little Help needed climbing 3-5 steps with a railing? : A Lot 6 Click Score: 13    End of Session Equipment Utilized During Treatment: Gait belt Activity Tolerance: Patient tolerated treatment well;Patient limited by fatigue Patient left: in bed;with family/visitor present;with call bell/phone  within reach Nurse Communication: Mobility status PT Visit Diagnosis: Unsteadiness on feet (R26.81);History of falling (Z91.81);Muscle weakness (generalized) (M62.81)    Time: 6063-0160 PT Time Calculation (min) (ACUTE ONLY): 23 min   Charges:   PT Evaluation $PT Eval Moderate Complexity: [place], [place]  Acute Rehab [phone]   Samuella Bruin 07/11/2018, 2:15 PM

## 2018-07-11 NOTE — Plan of Care (Signed)

## 2018-07-12 ENCOUNTER — Inpatient Hospital Stay (HOSPITAL_COMMUNITY): Payer: Medicare Other

## 2018-07-12 DIAGNOSIS — J90 Pleural effusion, not elsewhere classified: Secondary | ICD-10-CM

## 2018-07-12 LAB — CBC
HEMATOCRIT: 35.3 % — AB (ref 39.0–52.0)
HEMOGLOBIN: 11.3 g/dL — AB (ref 13.0–17.0)
MCH: 28.3 pg (ref 26.0–34.0)
MCHC: 32 g/dL (ref 30.0–36.0)
MCV: 88.3 fL (ref 78.0–100.0)
Platelets: 275 10*3/uL (ref 150–400)
RBC: 4 MIL/uL — ABNORMAL LOW (ref 4.22–5.81)
RDW: 14.9 % (ref 11.5–15.5)
WBC: 12 10*3/uL — AB (ref 4.0–10.5)

## 2018-07-12 LAB — AEROBIC/ANAEROBIC CULTURE (SURGICAL/DEEP WOUND)

## 2018-07-12 LAB — BASIC METABOLIC PANEL
Anion gap: 9 (ref 5–15)
BUN: 6 mg/dL — ABNORMAL LOW (ref 8–23)
CO2: 22 mmol/L (ref 22–32)
Calcium: 7.8 mg/dL — ABNORMAL LOW (ref 8.9–10.3)
Chloride: 104 mmol/L (ref 98–111)
Creatinine, Ser: 0.93 mg/dL (ref 0.61–1.24)
GFR calc Af Amer: >60 mL/min (ref >60)
GFR calc non Af Amer: >60 mL/min (ref >60)
Glucose, Bld: 102 mg/dL — ABNORMAL HIGH (ref 70–99)
Potassium: 3.8 mmol/L (ref 3.5–5.1)
Sodium: 135 mmol/L (ref 135–145)

## 2018-07-12 LAB — GLUCOSE, CAPILLARY
Glucose-Capillary: 101 mg/dL — ABNORMAL HIGH (ref 70–99)
Glucose-Capillary: 98 mg/dL (ref 70–99)

## 2018-07-12 LAB — AEROBIC/ANAEROBIC CULTURE W GRAM STAIN (SURGICAL/DEEP WOUND)

## 2018-07-12 LAB — ANAEROBIC CULTURE

## 2018-07-12 MED ORDER — ACETAMINOPHEN 500 MG PO TABS: 500.0000 mg | ORAL_TABLET | Freq: Three times a day (TID) | ORAL | 2 refills | Status: DC | PRN

## 2018-07-12 MED ORDER — CEFPODOXIME PROXETIL 200 MG PO TABS: 200.0000 mg | ORAL_TABLET | Freq: Two times a day (BID) | ORAL | 0 refills | Status: DC

## 2018-07-12 NOTE — Progress Notes (Signed)
IV removed; pt and wife educated on d/c instructions; prescriptions given; belongings collected; will wheel out once ready.   [place]  Kayce Betty, RN

## 2018-07-12 NOTE — Discharge Summary (Signed)
Clifford [name] LYY:[id number] DOB: Oct 11, 1951 DOA: 07/05/2018  PCP: [name], FNP  Admit date: 07/05/2018  Discharge date: 07/12/2018  Admitted From: Home  Disposition:  Home   Recommendations for Outpatient Follow-up:   Follow up with PCP in 1-2 weeks  PCP Please obtain BMP/CBC, 2 view CXR in 1week,  (see Discharge instructions)   PCP Please follow up on the following pending results:    Home Health: RN,PT   Equipment/Devices: None  Consultations: CTVS Discharge Condition: Stable   CODE STATUS: Full   Diet Recommendation: Heart Healthy    Chief Complaint  Patient presents with  . Shortness of Breath     Brief history of present illness from the day of admission and additional interim summary    Patient is a 68 y.o. male history of hypertension, EtOH use he admitted to any pain hospital for pneumonia, he was found on further evaluation to have empyema.  Patient was transferred to [facility], underwent VATS along with bilateral chest tube placement on 8/25.  See below for further details.                                                                 Hospital Course   Sepsis secondary to aspiration pneumonia and empyema with acute hypoxic respiratory failure: Sepsis pathophysiology has resolved, underwent VATS on 8/25 with bilateral chest tube placement.  Intraoperative cultures positive for Klebsiella, One chest tube was removed on 8/28 & 2nd on 8/29 by CVTS.    Symptom-free and eager to go home, not requiring any narcotics, will be placed on 10 days of oral Vantin and discharged home with outpatient PCP along with cardiothoracic surgery follow-up.  Acute kidney injury: Hemodynamically mediated in the setting of sepsis, resolved with supportive care.    Hypokalemia: Replete and  will.  EtOH use: Managed with Ativan per CIWA protocol, currently out of the window for withdrawal symptoms.  Counseled extensively.  Anemia: Secondary to critical illness, transfused 2 units of PRBC on 8/26.  Hemoglobin stable, CP to check CBC BMP next visit.  Hypertension: Blood pressure controlled, amlodipine started on 8/28.    Stable.   Discharge diagnosis     Principal Problem:   Pleural effusion, left Active Problems:   Alcoholism ([place])   Seizures ([place])   Hyperlipidemia   Essential hypertension   Malignant neoplasm of prostate ([place])   Aspiration pneumonia of both lower lobes due to gastric secretions ([place])   Sepsis ([place])    Discharge instructions    Discharge Instructions    Diet - low sodium heart healthy   Complete by:  As directed    Discharge instructions   Complete by:  As directed    Follow with Primary MD [name], FNP in 5 days   Get  CBC, CMP, 2 view Chest X ray checked  by Primary MD in 5 days   Activity: As tolerated with Full fall precautions use walker/cane & assistance as needed  Disposition Home   Diet:  Heart Healthy    For Heart failure patients - Check your Weight same time everyday, if you gain over 2 pounds, or you develop in leg swelling, experience more shortness of breath or chest pain, call your Primary MD immediately. Follow Cardiac Low Salt Diet and 1.5 lit/day fluid restriction.  Special Instructions: If you have smoked or chewed Tobacco  in the last 2 yrs please stop smoking, stop any regular Alcohol  and or any Recreational drug use.  On your next visit with your primary care physician please Get Medicines reviewed and adjusted.  Please request your Prim.MD to go over all Hospital Tests and Procedure/Radiological results at the follow up, please get all Hospital records sent to your Prim MD by signing hospital release before you go home.  If you experience worsening of your admission symptoms, develop shortness of  breath, life threatening emergency, suicidal or homicidal thoughts you must seek medical attention immediately by calling 911 or calling your MD immediately  if symptoms less severe.  You Must read complete instructions/literature along with all the possible adverse reactions/side effects for all the Medicines you take and that have been prescribed to you. Take any new Medicines after you have completely understood and accpet all the possible adverse reactions/side effects.   Do not drive when taking Pain medications.   Do not take more than prescribed Pain, Sleep and Anxiety Medications  Wear Seat belts while driving.   Increase activity slowly   Complete by:  As directed       Discharge Medications   Allergies as of 07/12/2018      Reactions   Lisinopril Swelling      Medication List    TAKE these medications   acetaminophen 500 MG tablet Commonly known as:  TYLENOL Take 1 tablet (500 mg total) by mouth every 8 (eight) hours as needed for moderate pain.   amLODipine 10 MG tablet Commonly known as:  NORVASC Take 10 mg by mouth daily.   B-COMPLEX-C PO Take 1 tablet by mouth every morning.   cefpodoxime 200 MG tablet Commonly known as:  VANTIN Take 1 tablet (200 mg total) by mouth 2 (two) times daily.   folic acid 1 MG tablet Commonly known as:  FOLVITE Take 1 tablet (1 mg total) by mouth daily.   multivitamin with minerals Tabs tablet Take 1 tablet by mouth daily.   pantoprazole 40 MG tablet Commonly known as:  PROTONIX Take 40 mg by mouth daily.   STOOL SOFTENER 100 MG capsule Generic drug:  docusate sodium Take 100 mg by mouth every morning.   thiamine 100 MG tablet Take 1 tablet (100 mg total) by mouth daily.       Follow-up Information    [name], MD Follow up on 08/13/2018.   Specialty:  Cardiothoracic Surgery Why:  Appointment is at 9:45, please get CXR at 9:15 at [facility] located on first floor of our office building Contact  information: [place] [address] [phone]        [place], [place], [place]. Schedule an appointment as soon as possible for a visit in 4 day(s).   Specialty:  Family Medicine Contact information: [address] [place] [address] [phone]  Major procedures and Radiology Reports - PLEASE review detailed and final reports thoroughly  -      8/25>>VATS    Dg Chest 1 View  Result Date: 07/05/2018 CLINICAL DATA:  Post LEFT thoracentesis EXAM: CHEST  1 VIEW COMPARISON:  Earlier study 07/05/2018 FINDINGS: No pneumothorax following LEFT thoracentesis. Persistent LEFT pleural effusion and basilar atelectasis accentuated by expiratory technique. RIGHT lung clear. Bones demineralized. IMPRESSION: No pneumothorax following LEFT thoracentesis. Electronically Signed   By: Lavonia Dana M.D.   On: 07/05/2018 14:25   Dg Chest 2 View  Result Date: 07/12/2018 CLINICAL DATA:  Postop evaluation. EXAM: CHEST - 2 VIEW COMPARISON:  07/11/2018. FINDINGS: Interim removal of right IJ line and left chest tube. Mediastinum and hilar structures are normal. Heart size stable. Diffuse left base infiltrate again noted. Mild right base atelectasis. Stable small left pleural effusion. Small right pleural effusion cannot be excluded. IMPRESSION: 1. Interim removal of right IJ line left chest tube. No pneumothorax. 2. Stable left base diffuse infiltrate. Stable left-sided pleural effusion. 3. Mild right base atelectasis. Tiny right pleural effusion cannot be excluded. Electronically Signed   By: Marcello Moores  Register   On: 07/12/2018 11:04   Dg Chest 2 View  Result Date: 06/24/2018 CLINICAL DATA:  Cough and weakness. EXAM: CHEST - 2 VIEW COMPARISON:  Chest x-ray dated Apr 05, 2018. FINDINGS: The heart remains borderline enlarged. No obscuration of the left heart border. Normal pulmonary vascularity. New moderate left pleural effusion with lingular and left lower lobe  atelectasis. The right lung is clear. No pneumothorax. No acute osseous abnormality. IMPRESSION: 1. New moderate left pleural effusion with lingular and left lower lobe atelectasis. Electronically Signed   By: Titus Dubin M.D.   On: 06/24/2018 16:13   Ct Chest Wo Contrast  Result Date: 07/06/2018 CLINICAL DATA:  Shortness of breath, chest pain. History of prostate cancer. EXAM: CT CHEST WITHOUT CONTRAST TECHNIQUE: Multidetector CT imaging of the chest was performed following the standard protocol without IV contrast. COMPARISON:  Radiograph of same day. FINDINGS: Cardiovascular: No significant vascular findings. Normal heart size. No pericardial effusion. Mediastinum/Nodes: No enlarged mediastinal or axillary lymph nodes. Thyroid gland, trachea, and esophagus demonstrate no significant findings. Lungs/Pleura: No pneumothorax is noted. Large left pleural effusion is noted with adjacent subsegmental atelectasis of left upper lobe and complete atelectasis of left lower lobe. Mild right posterior basilar subsegmental atelectasis is noted. Upper Abdomen: Fatty infiltration of the liver is noted. There are multiple nodular densities above the left kidney which may be inflammatory in etiology. Possible loculated subdiaphragmatic fluid collection is noted on the left as well. Musculoskeletal: No chest wall mass or suspicious bone lesions identified. IMPRESSION: Large left pleural effusion is noted with associated subsegmental atelectasis of left upper lobe and complete atelectasis of left lower lobe. Fatty infiltration of the liver. Multiple nodular densities are noted above the left kidney as well as possible loculated left subdiaphragmatic fluid collection. CT scan of the abdomen and pelvis with intravenous contrast is recommended for further evaluation. Electronically Signed   By: Marijo Conception, M.D.   On: 07/06/2018 14:18   Dg Chest Port 1 View  Result Date: 07/11/2018 CLINICAL DATA:  Short of breath,  chest tube, empyema by history EXAM: PORTABLE CHEST 1 VIEW COMPARISON:  Portable chest x-ray of 07/10/2018 and CT chest of 07/06/2017 FINDINGS: Aeration of the lungs has improved minimally. Airspace disease remains at the left mid lung and left lung base with left chest tube present. No pneumothorax  is seen with certainty. Linear opacity along the left lateral hemithorax represents prior route of a left chest tube which has been removed. Right lung is clear. Central venous line from the right extends near the expected location of the SVC-RA junction. IMPRESSION: 1. Persistent left basilar opacity most consistent with pneumonia and effusion. 2. Left chest tube present with 1 left chest tube removed. No definite pneumothorax. Electronically Signed   By: Ivar Drape M.D.   On: 07/11/2018 12:16   Dg Chest Port 1 View  Result Date: 07/10/2018 CLINICAL DATA:  Pleural effusion. Shortness of breath. Chest tube in place. EXAM: PORTABLE CHEST 1 VIEW COMPARISON:  07/09/2018 FINDINGS: Right jugular catheter and 2 left chest tubes are unchanged. The cardiac silhouette remains mildly enlarged. Extensive left lung airspace opacity, including dense consolidation in the left lung base, and residual left pleural effusion and/or pleural thickening do not appear significantly changed. The right lung remains clear. No pneumothorax is identified. IMPRESSION: 1. Unchanged left lung consolidation and left pleural effusion and/or pleural thickening. 2. No pneumothorax. Electronically Signed   By: Logan Bores M.D.   On: 07/10/2018 12:29   Dg Chest Port 1 View  Result Date: 07/09/2018 CLINICAL DATA:  Chest tube.  Shortness of breath.  Pleural effusion. EXAM: PORTABLE CHEST 1 VIEW COMPARISON:  07/08/2018. FINDINGS: Left chest tube, right IJ line stable position. Stable cardiomegaly. Stable diffuse left lung infiltrate and left-sided pleural effusion. No pneumothorax. Chest is unchanged from prior exam. IMPRESSION: 1. Stable line and  tube placements including 2 left chest tubes. No pneumothorax. 2. Diffuse left lung infiltrate left-sided pleural effusion. No interim change from prior exam. Electronically Signed   By: Marcello Moores  Register   On: 07/09/2018 09:41   Dg Chest Port 1 View  Result Date: 07/08/2018 CLINICAL DATA:  Empyema of pleural space ,,chest tube present EXAM: PORTABLE CHEST - 1 VIEW COMPARISON:  the previous day's study FINDINGS: Stable left chest tubes, directed towards the apex. No pneumothorax. Residual pleural thickening or layering effusion. Stable atelectasis/consolidation in the mid and lower left lung. Right lung remains clear. Stable right IJ central line.  Heart size within normal limits. Visualized bones unremarkable. IMPRESSION: Stable appearance since previous day's exam Electronically Signed   By: Lucrezia Europe M.D.   On: 07/08/2018 08:07   Dg Chest Port 1 View  Result Date: 07/07/2018 CLINICAL DATA:  Empyema. Postoperative. EXAM: PORTABLE CHEST 1 VIEW COMPARISON:  Chest radiograph from one day prior. FINDINGS: Two left apical chest tubes are in place. Right internal jugular central venous catheter terminates at the cavoatrial junction. Stable cardiomediastinal silhouette with top-normal heart size. No pneumothorax. No right pleural effusion. Residual mild peripheral left pleural thickening without significant residual left pleural effusion. Improved aeration of the left lung with persistent patchy opacity throughout the left lung. Clear right lung. IMPRESSION: 1. No pneumothorax. 2. Residual mild peripheral left pleural thickening without significant residual left pleural effusion. 3. Improved aeration of the left lung with persistent patchy opacity throughout the left lung. Electronically Signed   By: Ilona Sorrel M.D.   On: 07/07/2018 14:02   Dg Chest Port 1 View  Result Date: 07/06/2018 CLINICAL DATA:  Dyspnea. History of left pleural effusion and left thoracentesis. EXAM: PORTABLE CHEST 1 VIEW COMPARISON:   Chest radiograph 07/05/2018 FINDINGS: Increased densities throughout the left hemithorax suggests increased left pleural fluid. Left pleural effusion appears to be large for size. Limited evaluation of the heart and mediastinum due to the left chest densities. Cannot  exclude some mediastinal shift towards the right. No focal disease in the right lung. IMPRESSION: Markedly increased densities throughout the left hemithorax. Findings are suggestive for a large left pleural effusion. Electronically Signed   By: Markus Daft M.D.   On: 07/06/2018 13:17   Dg Chest Port 1 View  Result Date: 07/05/2018 CLINICAL DATA:  Shortness of breath. EXAM: PORTABLE CHEST 1 VIEW COMPARISON:  Chest x-ray dated June 24, 2018. FINDINGS: The left heart border is obscured. Interval enlargement of the left pleural effusion with likely complete collapse of the left lower lobe and partial collapse of the lingula. The right lung is clear. No pneumothorax. No acute osseous abnormality. IMPRESSION: 1. Worsening moderate to large left pleural effusion with left lower lobe and lingular collapse. Electronically Signed   By: Titus Dubin M.D.   On: 07/05/2018 11:38   US Thoracentesis Asp Pleural Space W/img Guide  Result Date: 07/05/2018 INDICATION: LEFT pleural effusion question empyema EXAM: ULTRASOUND GUIDED DIAGNOSTIC AND THERAPEUTIC LEFT THORACENTESIS MEDICATIONS: None. COMPLICATIONS: None immediate. PROCEDURE: Procedure, benefits, and risks of procedure were discussed with patient. Written informed consent for procedure was obtained. Time out protocol followed. Pleural effusion localized by ultrasound at the posterior LEFT hemithorax. Skin prepped and draped in usual sterile fashion. Skin and soft tissues anesthetized with 10 mL of 1% lidocaine. 8 French thoracentesis catheter placed into the LEFT pleural space. 500 mL of cloudy tan-colored fluid aspirated from the LEFT pleural space by syringe and vacuum bottle. Procedure terminated  prematurely due to patient discomfort and wanting to lie down. Procedure tolerated well by patient without immediate complication. FINDINGS: A total of approximately 500 mL of LEFT pleural fluid was removed. Samples were sent to the laboratory as requested by the clinical team. IMPRESSION: Successful ultrasound guided LEFT thoracentesis yielding 500 mL of pleural fluid. Electronically Signed   By: Lavonia Dana M.D.   On: 07/05/2018 13:56    Micro Results      Recent Results (from the past 240 hour(s))  Culture, blood (Routine x 2)     Status: None   Collection Time: 07/05/18 11:56 AM  Result Value Ref Range Status   Specimen Description BLOOD LEFT ARM  Final   Special Requests   Final    BOTTLES DRAWN AEROBIC AND ANAEROBIC Blood Culture adequate volume   Culture   Final    NO GROWTH 5 DAYS Performed at [facility], [address] [place], [place] [address]    Report Status 07/10/2018 FINAL  Final  Culture, blood (Routine x 2)     Status: None   Collection Time: 07/05/18 12:02 PM  Result Value Ref Range Status   Specimen Description BLOOD RIGHT HAND  Final   Special Requests   Final    BOTTLES DRAWN AEROBIC AND ANAEROBIC Blood Culture results may not be optimal due to an inadequate volume of blood received in culture bottles   Culture   Final    NO GROWTH 5 DAYS Performed at [facility], [address] [place], [place] [address]    Report Status 07/10/2018 FINAL  Final  Gram stain     Status: None   Collection Time: 07/05/18  1:35 PM  Result Value Ref Range Status   Specimen Description PLEURAL  Final   Special Requests NONE  Final   Gram Stain   Final    NO ORGANISMS SEEN WBC PRESENT, PREDOMINANTLY PMN CYTOSPIN SMEAR Performed at [facility], [address] [place], [place] [address]  Report Status 07/05/2018 FINAL  Final  Culture, body fluid-bottle     Status: None   Collection Time: 07/05/18  1:35 PM  Result Value Ref Range Status   Specimen Description PLEURAL   Final   Special Requests BOTTLES DRAWN AEROBIC AND ANAEROBIC 10CC  Final   Culture   Final    NO GROWTH 5 DAYS Performed at [facility], [address], [place], [place] [address]    Report Status 07/10/2018 FINAL  Final  Surgical PCR screen     Status: None   Collection Time: 07/07/18  8:42 AM  Result Value Ref Range Status   MRSA, PCR NEGATIVE NEGATIVE Final   Staphylococcus aureus NEGATIVE NEGATIVE Final    Comment: (NOTE) The Xpert SA Assay (FDA approved for NASAL specimens in patients 38 years of age and older), is one component of a comprehensive surveillance program. It is not intended to diagnose infection nor to guide or monitor treatment. Performed at [facility], [place] [address] [place], [place] [address]   Anaerobic culture     Status: None (Preliminary result)   Collection Time: 07/07/18 10:37 AM  Result Value Ref Range Status   Specimen Description FLUID PLEURAL LEFT  Final   Special Requests   Final    NONE Performed at [facility], [place] [address], [place], [place] [address]    Gram Stain PENDING  Incomplete   Culture   Final    NO ANAEROBES ISOLATED; CULTURE IN PROGRESS FOR 5 DAYS   Report Status PENDING  Incomplete  Body fluid culture     Status: None   Collection Time: 07/07/18 10:37 AM  Result Value Ref Range Status   Specimen Description PLEURAL LEFT  Final   Special Requests NONE  Final   Gram Stain   Final    MODERATE WBC PRESENT, PREDOMINANTLY PMN NO ORGANISMS SEEN    Culture   Final    NO GROWTH 3 DAYS Performed at [facility], [place] [address] [place], [place] [address]    Report Status 07/11/2018 FINAL  Final  Fungus Culture With Stain     Status: None (Preliminary result)   Collection Time: 07/07/18 10:37 AM  Result Value Ref Range Status   Fungus Stain Final report  Final    Comment: (NOTE) Performed At: [facility] [place], [place] [id number] Rush Farmer MD [id number]    Fungus  (Mycology) Culture PENDING  Incomplete   Fungal Source FLUID  Final    Comment: PLEURAL LEFT Performed at [facility], [place] [address], [place], [place] [address]   Acid Fast Smear (AFB)     Status: None   Collection Time: 07/07/18 10:37 AM  Result Value Ref Range Status   AFB Specimen Processing Concentration  Final   Acid Fast Smear Negative  Final    Comment: (NOTE) Performed At: [facility] [place], [place] [id number] Rush Farmer MD [id number]    Source (AFB) FLUID  Final    Comment: PLEURAL LEFT Performed at [facility], [place] [address], [place], [place] [address]   Fungus Culture Result     Status: None   Collection Time: 07/07/18 10:37 AM  Result Value Ref Range Status   Result 1 Comment  Final    Comment: (NOTE) KOH/Calcofluor preparation:  no fungus observed. Performed At: [facility] [address] [place], [place] [id number] Rush Farmer MD [id number]   Fungus Culture With  Stain     Status: None (Preliminary result)   Collection Time: 07/07/18 12:08 PM  Result Value Ref Range Status   Fungus Stain Final report  Final    Comment: (NOTE) Performed At: [facility] [place], [place] [id number] Rush Farmer MD [id number]    Fungus (Mycology) Culture PENDING  Incomplete   Fungal Source TISSUE  Final    Comment: PLEURAL Performed at [facility], [place] [address] [place], [place] [address]   Aerobic/Anaerobic Culture (surgical/deep wound)     Status: None   Collection Time: 07/07/18 12:08 PM  Result Value Ref Range Status   Specimen Description TISSUE PLEURAL  Final   Special Requests NONE  Final   Gram Stain   Final    MODERATE WBC PRESENT, PREDOMINANTLY PMN NO ORGANISMS SEEN    Culture   Final    RARE KLEBSIELLA PNEUMONIAE NO ANAEROBES ISOLATED CRITICAL RESULT CALLED TO, READ BACK BY AND VERIFIED WITH: JOHNSON RN AT 1200 ON [id number] BY SJW Performed at [facility], [place] [address] [place], [place] [address]    Report Status 07/12/2018 FINAL  Final   Organism ID, Bacteria KLEBSIELLA PNEUMONIAE  Final      Susceptibility   Klebsiella pneumoniae - MIC*    AMPICILLIN >=32 RESISTANT Resistant     CEFAZOLIN 16 SENSITIVE Sensitive     CEFEPIME <=1 SENSITIVE Sensitive     CEFTAZIDIME <=1 SENSITIVE Sensitive     CEFTRIAXONE <=1 SENSITIVE Sensitive     CIPROFLOXACIN <=0.25 SENSITIVE Sensitive     GENTAMICIN <=1 SENSITIVE Sensitive     IMIPENEM <=0.25 SENSITIVE Sensitive     TRIMETH/SULFA <=20 SENSITIVE Sensitive     AMPICILLIN/SULBACTAM >=32 RESISTANT Resistant     PIP/TAZO 32 INTERMEDIATE Intermediate     Extended ESBL NEGATIVE Sensitive     * RARE KLEBSIELLA PNEUMONIAE  Acid Fast Smear (AFB)     Status: None   Collection Time: 07/07/18 12:08 PM  Result Value Ref Range Status   AFB Specimen Processing Concentration  Final   Acid Fast Smear Negative  Final    Comment: (NOTE) Performed At: [facility] [place], [place] [id number] Rush Farmer MD [id number]    Source (AFB) TISSUE  Final    Comment: PLEURAL Performed at [facility], [place] [address] [place], [place] [address]   Fungus Culture Result     Status: None   Collection Time: 07/07/18 12:08 PM  Result Value Ref Range Status   Result 1 Comment  Final    Comment: (NOTE) KOH/Calcofluor preparation:  no fungus observed. Performed At: [facility] [place], [place] [id number] Rush Farmer MD [id number]     Today   Subjective    Clifford [name] today has no headache,no chest abdominal pain,no new weakness tingling or numbness, feels much better wants to go home today.     Objective   Blood pressure 133/84, pulse 91, temperature 98.5 F (36.9 C), temperature source Oral, resp. rate (!) 22, height 5\' 5"  (1.651 m), weight 69.9 kg, SpO2 96 %.   Intake/Output Summary (Last 24 hours) at 07/12/2018 1149 Last data  filed at 07/12/2018 1116 Gross per 24 hour  Intake 382.41 ml  Output 645 ml  Net -262.59 ml    Exam Awake Alert, Oriented x 3, No new F.N deficits, Normal affect [place].AT,PERRAL Supple Neck,No JVD, No cervical lymphadenopathy appriciated.  Symmetrical Chest wall movement, Good air movement bilaterally, CTAB  RRR,No Gallops,Rubs or new Murmurs, No Parasternal Heave +ve B.Sounds, Abd Soft, Non tender, No organomegaly appriciated, No rebound -guarding or rigidity. No Cyanosis, Clubbing or edema, No new Rash or bruise   Data Review   CBC w Diff:  Lab Results  Component Value Date   WBC 12.0 (H) 07/12/2018   HGB 11.3 (L) 07/12/2018   HCT 35.3 (L) 07/12/2018   PLT 275 07/12/2018   LYMPHOPCT 4 07/07/2018   MONOPCT 8 07/07/2018   EOSPCT 0 07/07/2018   BASOPCT 0 07/07/2018    CMP:  Lab Results  Component Value Date   NA 135 07/12/2018   K 3.8 07/12/2018   CL 104 07/12/2018   CO2 22 07/12/2018   BUN 6 (L) 07/12/2018   CREATININE 0.93 07/12/2018   PROT 4.6 (L) 07/09/2018   ALBUMIN 1.6 (L) 07/09/2018   BILITOT 1.3 (H) 07/09/2018   ALKPHOS 65 07/09/2018   AST 25 07/09/2018   ALT 12 07/09/2018  .   Total Time in preparing paper work, data evaluation and todays exam - 8 minutes  Lala Lund M.D on 07/12/2018 at 11:49 AM  [facility]   Office  [phone]

## 2018-07-12 NOTE — Care Management Important Message (Signed)
Important Message  Patient Details  Name: Clifford [name] MRN: [id number] Date of Birth: [birth year]   Medicare Important Message Given:  Yes    [name] P [place] 07/12/2018, 3:21 PM

## 2018-07-12 NOTE — Care Management Note (Addendum)
Case Management Note Previous CM note completed by Maryclare Labrador, RN 07/09/2018, 3:14 PM   Patient Details  Name: Clifford [name] MRN: [id number] Date of Birth: 1951-04-16  Subjective/Objective:   Pt is s/p VATS                 Action/Plan:  PTA independent from home with wife.  Pt currently abuses alcohol - CSW consulted. Pt has PCP and denied barriers with paying for medications as prescribed.  CM will continue to follow    Expected Discharge Date:  07/12/18               Expected Discharge Plan:  [place]  In-House Referral:     Discharge planning Services  CM Consult  Post Acute Care Choice:  Home Health Choice offered to:  Patient, Spouse  DME Arranged:    DME Agency:     HH Arranged:  RN, PT HH Agency: [facility]  Status of Service:  Completed, signed off  If discussed at [place] of Stay Meetings, dates discussed:    Discharge Disposition: home/home health   Additional Comments:  07/12/18- 1230- Jatniel Verastegui RN, CM- pt for discharge home today- noted orders for HHRN/PT- spoke with pt and wife at bedside- agreeable to [facility] services- choice offered for [facility] agency- per wife they do not have preference for agency- CM will reach out to [facility] to see if they can accept referral and services pt's zip code. Wife agreeable to this. Discussed DME needs- per wife and pt they have all needed DME at home. Wife to transport pt home. No barriers noted for transition to home.  Call made to [facility] with [facility] regarding [facility] referral- unfortunately [facility] does not service pt's zip code at this time. Reached out to [place] with [facility] who is able to provide services- referral has been accepted with start of care planned for Sept. 1, 2019    Dawayne Patricia, RN 07/12/2018, 12:32 PM [phone] 4E Transition Care Coordinator-- Cross Coverage for 2C

## 2018-07-12 NOTE — Discharge Instructions (Signed)
Chest Tube Insertion, Adult A chest tube is a thin, flexible tube that is inserted into the space between your lung and your chest wall. You may need a chest tube if you have a collapsed lung from illness or a severe injury. A collapsed lung can be caused by:  An air leak (pneumothorax).  Blood collection (hemothorax).  Fluid buildup from an infection (empyema).  The chest tube drains the fluid or air from your lung. It may be attached to a suction device to help with drainage. You will need to stay in the hospital while the chest tube is in place. Tell a health care provider about:  Any allergies you have.  All medicines you are taking, including vitamins, herbs, eye drops, creams, and over-the-counter medicines.  Any problems you or family members have had with anesthetic medicines.  Any blood disorders you have.  Any surgeries you have had.  Any medical conditions you have, including any recent fever or cold symptoms.  Whether you are pregnant or may be pregnant. What are the risks? Generally, this is a safe procedure. However, problems may occur, including:  Bleeding.  Infection.  Allergic reaction to medicines.  Lung damage.  Damage to the blood vessels or nerves near the lung.  Failure of the chest tube to work properly.  What happens before the procedure? Staying hydrated Follow instructions from your health care provider about hydration, which may include:  Up to 2 hours before the procedure - you may continue to drink clear liquids, such as water, clear fruit juice, black coffee, and plain tea.  Eating and drinking restrictions Follow instructions from your health care provider about eating and drinking, which may include:  8 hours before the procedure - stop eating heavy meals or foods such as meat, fried foods, or fatty foods.  6 hours before the procedure - stop eating light meals or foods, such as toast or cereal.  6 hours before the procedure -  stop drinking milk or drinks that contain milk.  2 hours before the procedure - stop drinking clear liquids.  Medicines  Ask your health care provider about: ? Changing or stopping your regular medicines. This is especially important if you are taking diabetes medicines or blood thinners. ? Taking medicines such as aspirin and ibuprofen. These medicines can thin your blood. Do not take these medicines before your procedure if your health care provider instructs you not to. General instructions  You will have a chest X-ray or other imaging studies of the lung.  Plan to have someone take you home from the hospital or clinic.  If you will be going home right after the procedure, plan to have someone with you for 24 hours. What happens during the procedure?  To reduce your risk of infection: ? Your health care team will wash or sanitize their hands. ? Your skin will be washed with soap. ? Hair may be removed from the surgical area.  An IV tube will be inserted into one of your veins.  You will be given one or more of the following: ? A medicine to help you relax (sedative). ? A medicine to numb the area (local anesthetic).  You may be given antibiotic and pain medicines through the IV tube.  The surgeon will make a small incision in a space between your ribs.  The chest tube will be placed through the incision into the space between the lung and your chest wall.  Stitches (sutures) will be used to  close the incision around the tube.  The chest tube may be attached to a suction device.  The incision site will be covered with an airtight bandage (dressing).  Another chest X-ray will be done to check the position of the tube. The procedure may vary among health care providers and hospitals. What happens after the procedure?  Your blood pressure, heart rate, breathing rate, and blood oxygen level will be monitored until the medicines you were given have worn off.  You may  continue to get pain medicine or antibiotics through the IV tube.  The tube and dressing will be checked regularly.  You will be encouraged to cough and take deep breaths.  You may be given oxygen to breathe.  Chest X-rays will be done to find out if the lung is inflating. After the lung is inflated: ? Another chest X-ray may be done. ? The chest tube can be removed after the lung remains inflated and you are breathing easily. ? A new dressing will be put on.  Do not drive for 24 hours if you were given a sedative. This information is not intended to replace advice given to you by your health care provider. Make sure you discuss any questions you have with your health care provider. Document Released: 02/07/2007 Document Revised: 05/19/2016 Document Reviewed: 04/12/2016 Elsevier Interactive Patient Education  2018 [place] Follow with Primary MD [name], FNP in 5 days   Get CBC, CMP, 2 view Chest X ray checked  by Primary MD in 5 days   Activity: As tolerated with Full fall precautions use walker/cane & assistance as needed  Disposition Home   Diet:  Heart Healthy    For Heart failure patients - Check your Weight same time everyday, if you gain over 2 pounds, or you develop in leg swelling, experience more shortness of breath or chest pain, call your Primary MD immediately. Follow Cardiac Low Salt Diet and 1.5 lit/day fluid restriction.  Special Instructions: If you have smoked or chewed Tobacco  in the last 2 yrs please stop smoking, stop any regular Alcohol  and or any Recreational drug use.  On your next visit with your primary care physician please Get Medicines reviewed and adjusted.  Please request your Prim.MD to go over all Hospital Tests and Procedure/Radiological results at the follow up, please get all Hospital records sent to your Prim MD by signing hospital release before you go home.  If you experience worsening of your admission symptoms, develop  shortness of breath, life threatening emergency, suicidal or homicidal thoughts you must seek medical attention immediately by calling 911 or calling your MD immediately  if symptoms less severe.  You Must read complete instructions/literature along with all the possible adverse reactions/side effects for all the Medicines you take and that have been prescribed to you. Take any new Medicines after you have completely understood and accpet all the possible adverse reactions/side effects.   Do not drive when taking Pain medications.   Do not take more than prescribed Pain, Sleep and Anxiety Medications  Wear Seat belts while driving.   Video-Assisted Thoracic Surgery, Care After This sheet gives you information about how to care for yourself after your procedure. Your health care provider may also give you more specific instructions. If you have problems or questions, contact your health care provider. What can I expect after the procedure? After the procedure, it is common to have:  Some pain and soreness in your chest.  Pain when  breathing in (inhaling) and coughing.  Constipation.  Fatigue.  Difficulty sleeping.  Follow these instructions at home: Preventing pneumonia  Take deep breaths or do breathing exercises as instructed by your health care provider. Doing this helps prevent lung infection (pneumonia).  Cough frequently. Coughing may cause discomfort, but it is important to clear mucus (phlegm) and expand your lungs. If it hurts to cough, hold a pillow against your chest or place the palms of both hands on top of the incision (use splinting) when you cough. This may help relieve discomfort.  If you were given an incentive spirometer, use it as directed. An incentive spirometer is a tool that measures how well you are filling your lungs with each breath.  Participate in pulmonary rehabilitation as directed by your health care provider. This is a program that combines  education, exercise, and support from a team of specialists. The goal is to help you heal and get back to your normal activities as soon as possible. Medicines  Take over-the-counter or prescription medicines only as told by your health care provider.  If you have pain, take pain-relieving medicine before your pain becomes severe. This is important because if your pain is under control, you will be able to breathe and cough more comfortably.  If you were prescribed an antibiotic medicine, take it as told by your health care provider. Do not stop taking the antibiotic even if you start to feel better. Activity  Ask your health care provider what activities are safe for you.  Avoid activities that use your chest muscles for at least 3-4 weeks.  Do not lift anything that is heavier than 10 lb (4.5 kg), or the limit that your health care provider tells you, until he or she says that it is safe. Incision care  Follow instructions from your health care provider about how to take care of your incision(s). Make sure you: ? Wash your hands with soap and water before you change your bandage (dressing). If soap and water are not available, use hand sanitizer. ? Change your dressing as told by your health care provider. ? Leave stitches (sutures), skin glue, or adhesive strips in place. These skin closures may need to stay in place for 2 weeks or longer. If adhesive strip edges start to loosen and curl up, you may trim the loose edges. Do not remove adhesive strips completely unless your health care provider tells you to do that.  Keep your dressing dry until it has been removed.  Check your incision area every day for signs of infection. Check for: ? Redness, swelling, or pain. ? Fluid or blood. ? Warmth. ? Pus or a bad smell. Bathing  Do not take baths, swim, or use a hot tub until your health care provider approves. You may take showers.  After your dressing has been removed, use soap and  water to gently wash your incision area. Do not use anything else to clean your incision(s) unless your health care provider tells you to do this. Driving  Do not drive until your health care provider approves.  Do not drive or use heavy machinery while taking prescription pain medicine. Eating and drinking  Eat a healthy, balanced diet as instructed by your health care provider. A healthy diet includes plenty of fresh fruits and vegetables, whole grains, and low-fat (lean) proteins.  Limit foods that are high in fat and processed sugars, such as fried and sweet foods.  Drink enough fluid to keep your urine clear  or pale yellow. General instructions  To prevent or treat constipation while you are taking prescription pain medicine, your health care provider may recommend that you: ? Take over-the-counter or prescription medicines. ? Eat foods that are high in fiber, such as beans, fresh fruits and vegetables, and whole grains.  Do not use any products that contain nicotine or tobacco, such as cigarettes and e-cigarettes. If you need help quitting, ask your health care provider.  Avoid secondhand smoke.  Wear compression stockings as told by your health care provider. These stockings help to prevent blood clots and reduce swelling in your legs.  If you have a chest tube, care for it as instructed by your health care provider. Do not travel by airplane during the 2 weeks after your chest tube is removed, or until your health care provider says that this is safe.  Keep all follow-up visits as told by your health care provider. This is important. Contact a health care provider if:  You have redness, swelling, or pain around an incision.  You have fluid or blood coming from an incision.  Your incision area feels warm to the touch.  You have pus or a bad smell coming from an incision.  You have a fever or chills.  You have nausea or vomiting.  You have pain that does not get better  with medicine. Get help right away if:  You have chest pain.  Your heart is fluttering or beating rapidly.  You develop a rash.  You have shortness of breath or trouble breathing.  You are confused.  You have trouble speaking.  You feel weak, light-headed, or dizzy.  You faint. Summary  To help prevent lung infection (pneumonia), take deep breaths or do breathing exercises as instructed by your health care provider.  Cough frequently to clear mucus (phlegm) and expand your lungs. If it hurts to cough, hold a pillow against your chest or place the palms of both hands on top of the incision (use splinting) when you cough.  If you have pain, take pain-relieving medicine before your pain becomes severe. This is important because if your pain is under control, you will be able to breathe and cough more comfortably.  Ask your health care provider what activities are safe for you. This information is not intended to replace advice given to you by your health care provider. Make sure you discuss any questions you have with your health care provider. Document Released: 02/24/2013 Document Revised: 10/09/2016 Document Reviewed: 10/09/2016 Elsevier Interactive Patient Education  2017 [employer].

## 2018-08-06 LAB — FUNGUS CULTURE WITH STAIN

## 2018-08-06 LAB — FUNGAL ORGANISM REFLEX

## 2018-08-06 LAB — FUNGUS CULTURE RESULT

## 2018-08-12 ENCOUNTER — Other Ambulatory Visit: Payer: Self-pay | Admitting: Thoracic Surgery (Cardiothoracic Vascular Surgery)

## 2018-08-12 DIAGNOSIS — J90 Pleural effusion, not elsewhere classified: Secondary | ICD-10-CM

## 2018-08-13 ENCOUNTER — Ambulatory Visit
Admission: RE | Admit: 2018-08-13 | Discharge: 2018-08-13 | Disposition: A | Discharge status: Home / Self Care | Payer: Medicare Other | Source: Ambulatory Visit | Attending: Thoracic Surgery (Cardiothoracic Vascular Surgery) | Admitting: Thoracic Surgery (Cardiothoracic Vascular Surgery)

## 2018-08-13 ENCOUNTER — Other Ambulatory Visit: Payer: Self-pay

## 2018-08-13 ENCOUNTER — Ambulatory Visit (INDEPENDENT_AMBULATORY_CARE_PROVIDER_SITE_OTHER): Payer: Self-pay | Admitting: Thoracic Surgery (Cardiothoracic Vascular Surgery)

## 2018-08-13 ENCOUNTER — Encounter: Payer: Self-pay | Admitting: Thoracic Surgery (Cardiothoracic Vascular Surgery)

## 2018-08-13 VITALS — BP 98/66 | HR 89 | Temp 98.3°F | Resp 16 | Ht 66.0 in | Wt 135.6 lb

## 2018-08-13 DIAGNOSIS — Z09 Encounter for follow-up examination after completed treatment for conditions other than malignant neoplasm: Secondary | ICD-10-CM

## 2018-08-13 DIAGNOSIS — J90 Pleural effusion, not elsewhere classified: Secondary | ICD-10-CM

## 2018-08-13 DIAGNOSIS — J869 Pyothorax without fistula: Secondary | ICD-10-CM

## 2018-08-13 NOTE — Progress Notes (Signed)
[address]       [place] [address]             [phone]     HPI: Clifford [name] returns for scheduled follow-up visit  Clifford [name] is a 67 year old gentleman who is a non-smoker.  He presented with a left pneumonia with a large parapneumonic effusion which turned out to be an early organizing empyema.  He underwent left VATS for drainage of the empyema and decortication on 07/07/2018.  Cultures grew out Klebsiella.  He was treated with antibiotics.  He was discharged home on 07/12/2018.  He completed his antibiotics as an outpatient.  He says that he has some soreness at his incision.  He denies any cough or shortness of breath.  His wife says that he sometimes feels like he may have a low-grade fever.  Past Medical History:  Diagnosis Date  . Alcoholism ([place])   . Anemia   . High cholesterol   . Hypertension   . Prostate cancer ([place])   . Seizures ([place])     Current Outpatient Medications  Medication Sig Dispense Refill  . acetaminophen (TYLENOL) 500 MG tablet Take 1 tablet (500 mg total) by mouth every 8 (eight) hours as needed for moderate pain. 25 tablet 2  . amLODipine (NORVASC) 10 MG tablet Take 10 mg by mouth daily.    . B-COMPLEX-C PO Take 1 tablet by mouth every morning.    . docusate sodium (STOOL SOFTENER) 100 MG capsule Take 100 mg by mouth every morning.     . Multiple Vitamin (MULTIVITAMIN WITH MINERALS) TABS tablet Take 1 tablet by mouth daily.    . pantoprazole (PROTONIX) 40 MG tablet Take 40 mg by mouth daily.    Marland Kitchen thiamine 100 MG tablet Take 1 tablet (100 mg total) by mouth daily.    . folic acid (FOLVITE) 1 MG tablet Take 1 tablet (1 mg total) by mouth daily. (Patient not taking: Reported on 06/24/2018)     No current facility-administered medications for this visit.     Physical Exam BP 98/66 (BP Location: Right Arm, Patient Position: Sitting, Cuff Size: Normal)   Pulse 89   Temp 98.3 F (36.8 C)   Resp 16   Ht 5\' 6"  (1.676 m)    Wt 135 lb 9.6 oz (61.5 kg)   SpO2 99% Comment: ON RA  BMI 21.74 kg/m  67 year old man in no acute distress Alert x3 with no focal deficits Lungs diminished at left base, otherwise clear Incisions well-healed Cardiac regular rate and rhythm normal S1-S2  Diagnostic Tests: CHEST - 2 VIEW  COMPARISON:  07/12/2018.  FINDINGS: Mediastinum and hilar structures normal. Left base infiltrate consistent pneumonia. Small left pleural effusion. Findings have improved slightly from prior exam. No pneumothorax.  IMPRESSION: Left base infiltrate consistent pneumonia. Small left pleural effusion. Findings have improved slightly from prior exam.   Electronically Signed   By: [place]   On: 08/13/2018 09:06 I personally reviewed the chest x-ray and concur with the findings noted above  Impression: Clifford [name] is a 67 year old gentleman who is now about 6 weeks postop from a left VATS for decortication for an early organizing empyema.  He is doing well.  He is having minimal discomfort.  His x-ray shows some residual pleural thickening at the left base but overall is dramatically improved.  He is not having any symptoms of recurrent pneumonia.  His activities are unrestricted at this time.  Plan: I  will be happy to see Clifford [name] back anytime the future if I can be of any further assistance with his care  [name], MD [facility] [phone]

## 2018-08-20 LAB — ACID FAST CULTURE WITH REFLEXED SENSITIVITIES (MYCOBACTERIA)
Acid Fast Culture: NEGATIVE
Acid Fast Culture: NEGATIVE

## 2018-08-20 LAB — ACID FAST CULTURE WITH REFLEXED SENSITIVITIES

## 2018-12-26 IMAGING — CT CT CHEST W/O CM
2 of 6 series · 14 of 36 positions shown, 18 images · non-contrast
Comparison: Radiograph of same day.

CLINICAL DATA: Shortness of breath, chest pain. History of prostate
cancer.

EXAM:
CT CHEST WITHOUT CONTRAST
TECHNIQUE: Multidetector CT imaging of the chest was performed following the
standard protocol without IV contrast.

[Series 2: thorax · axial · 0.68mm/px · z∈[-321,-69]mm · 13 of 148 slices shown, 17 images]
[im 11/148  mediastinal]
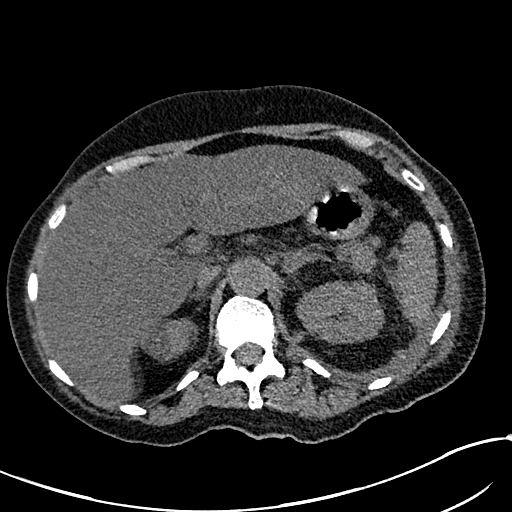
[im 11/148  lung]
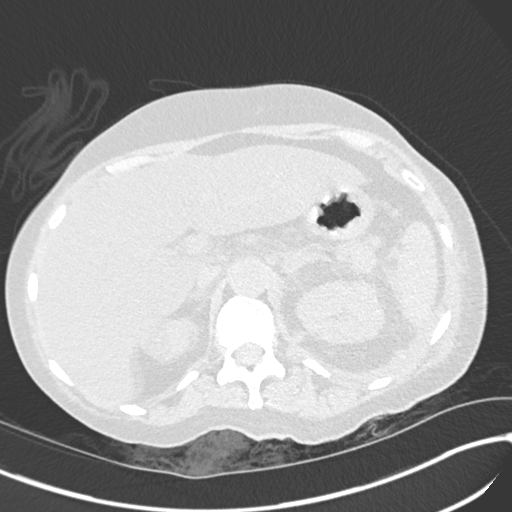
[im 22/148  lung]
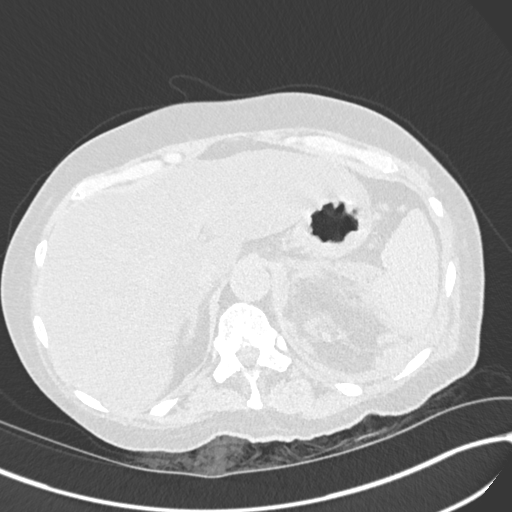
[im 32/148  lung]
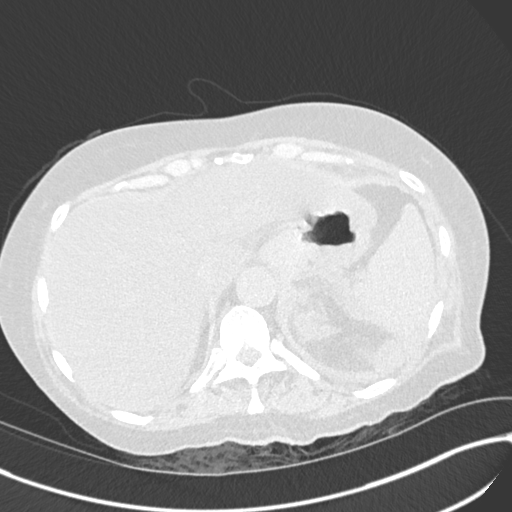
[im 43/148  lung]
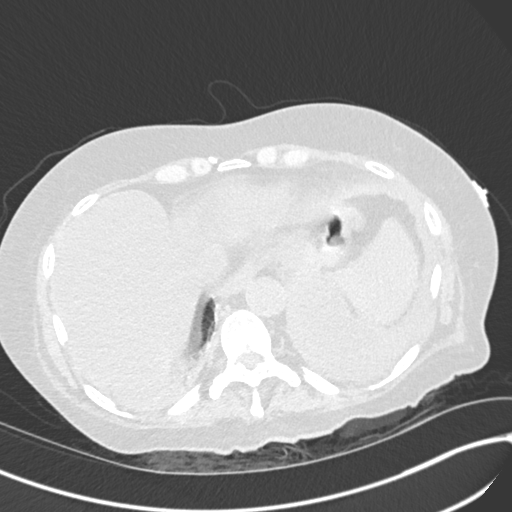
[im 53/148  mediastinal]
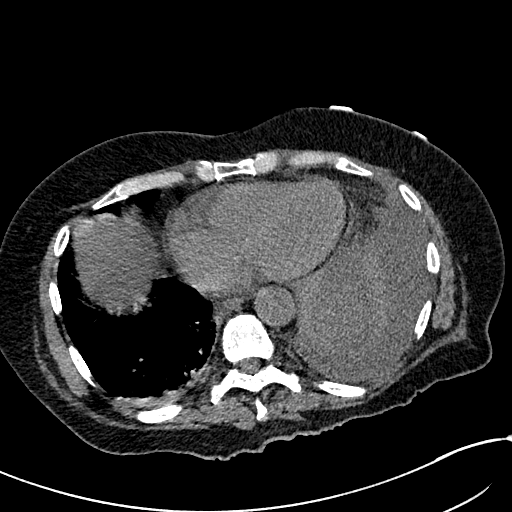
[im 53/148  lung]
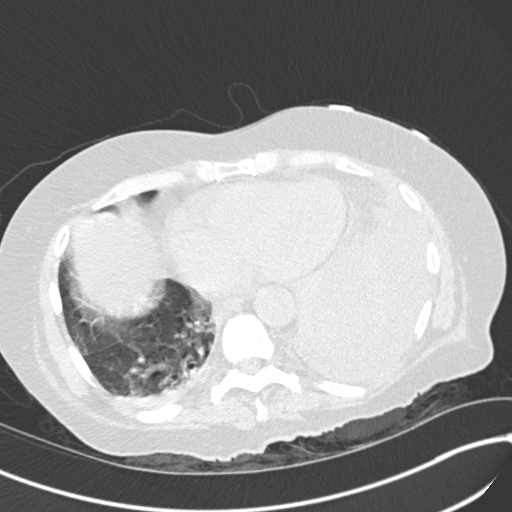
[im 64/148  lung]
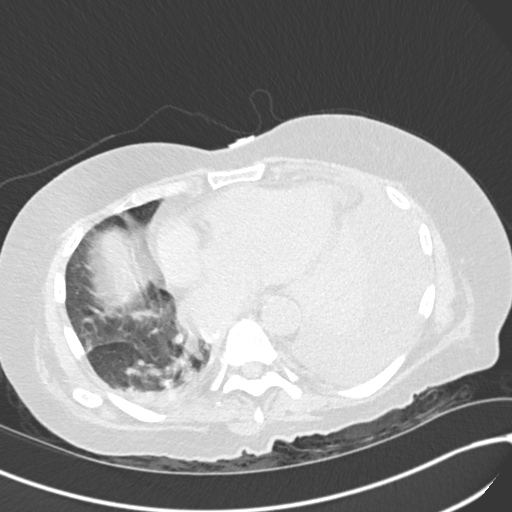
[im 74/148  lung]
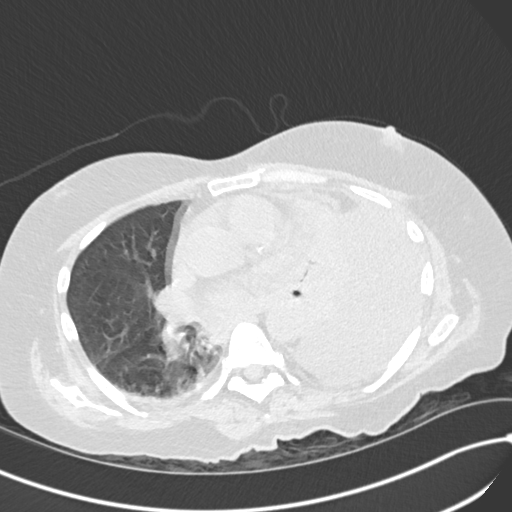
[im 85/148  lung]
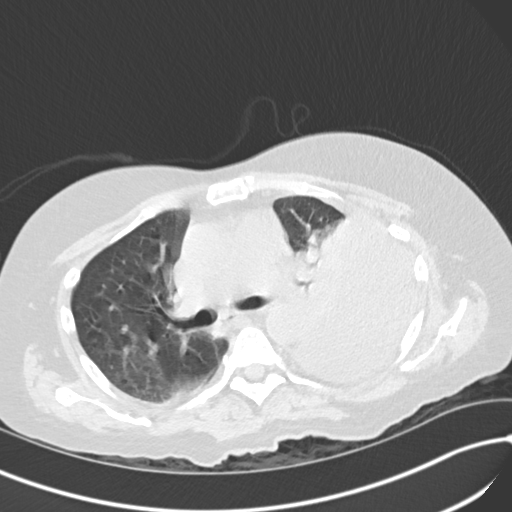
[im 95/148  mediastinal]
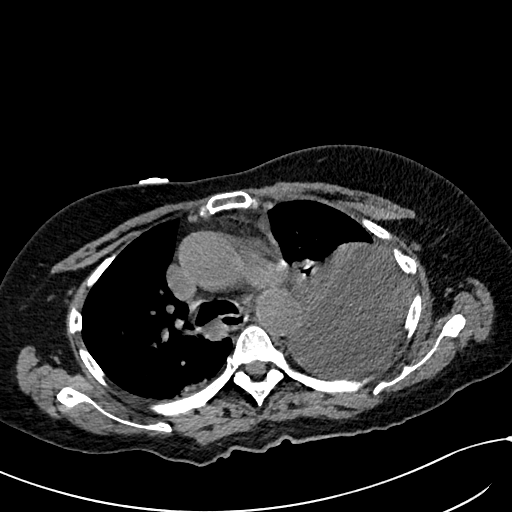
[im 95/148  lung]
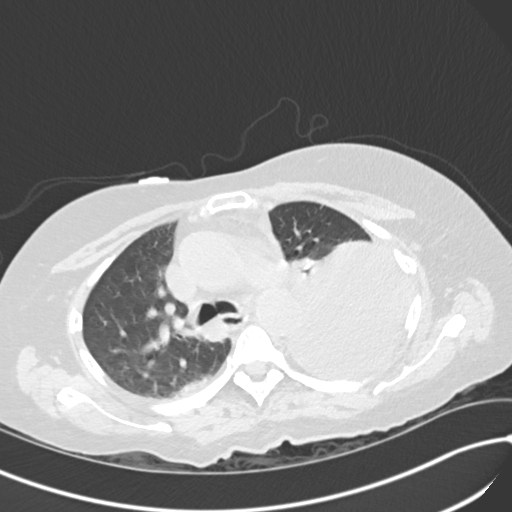
[im 106/148  lung]
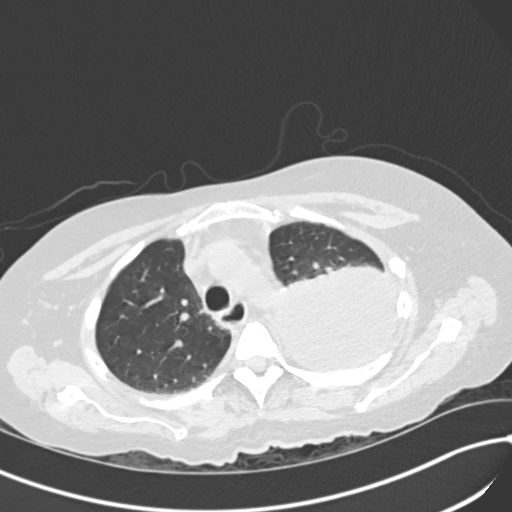
[im 116/148  lung]
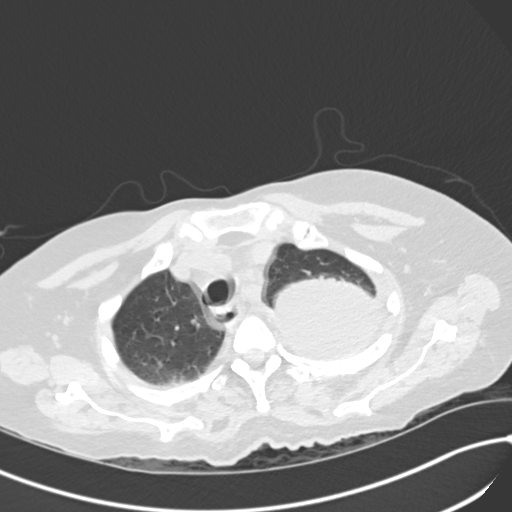
[im 127/148  lung]
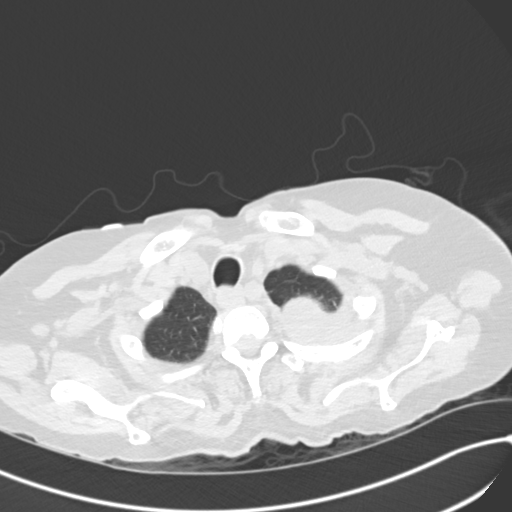
[im 137/148  mediastinal]
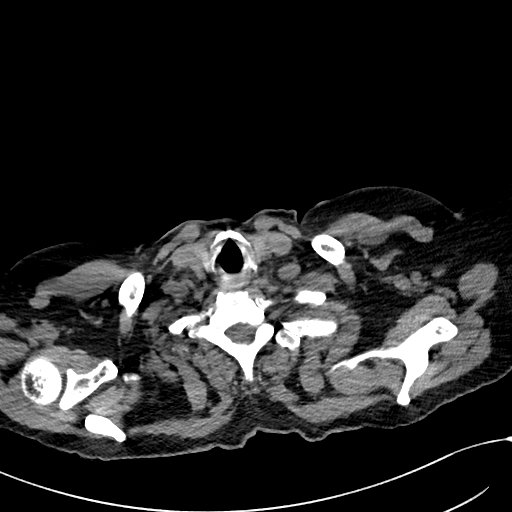
[im 137/148  lung]
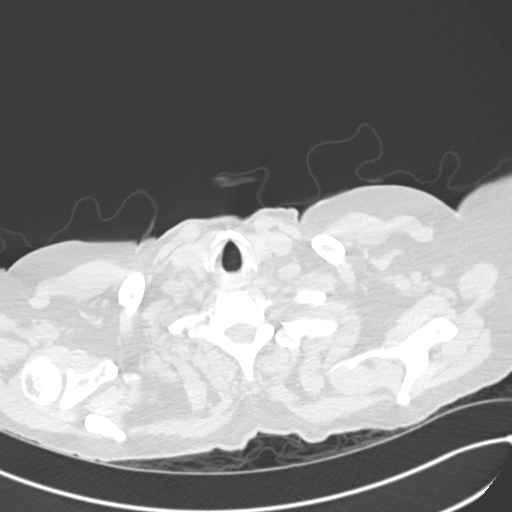

[Series 5: coronal · coronal · 0.59mm/px · 1 of 117 slices shown]
[im 59/117  lung]
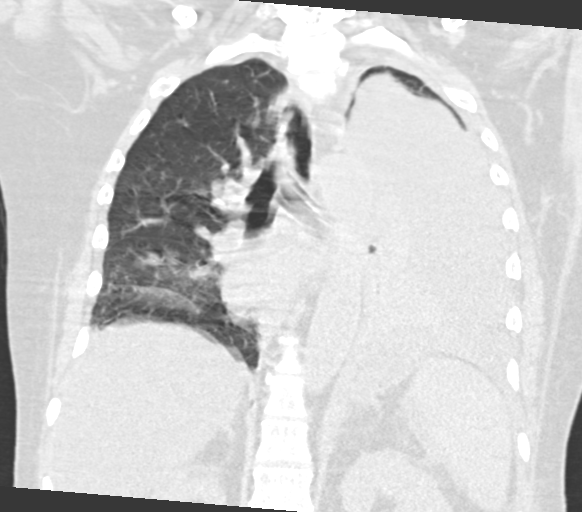

[14 of 36 positions shown; findings below may reference images not displayed]

FINDINGS: Cardiovascular: No significant vascular findings. Normal heart size.
No pericardial effusion.

Mediastinum/Nodes: No enlarged mediastinal or axillary lymph nodes.
Thyroid gland, trachea, and esophagus demonstrate no significant
findings.

Lungs/Pleura: No pneumothorax is noted. Large left pleural effusion
is noted with adjacent subsegmental atelectasis of left upper lobe
and complete atelectasis of left lower lobe. Mild right posterior
basilar subsegmental atelectasis is noted.

Upper Abdomen: Fatty infiltration of the liver is noted. There are
multiple nodular densities above the left kidney which may be
inflammatory in etiology. Possible loculated subdiaphragmatic fluid
collection is noted on the left as well.

Musculoskeletal: No chest wall mass or suspicious bone lesions
identified.
IMPRESSION: Large left pleural effusion is noted with associated subsegmental
atelectasis of left upper lobe and complete atelectasis of left
lower lobe.

Fatty infiltration of the liver.

Multiple nodular densities are noted above the left kidney as well
as possible loculated left subdiaphragmatic fluid collection. CT
scan of the abdomen and pelvis with intravenous contrast is
recommended for further evaluation.

## 2018-12-27 IMAGING — DX DG CHEST 1V PORT
1 series · 1 of 1 positions shown · non-contrast
Comparison: Chest radiograph from one day prior.

CLINICAL DATA: Empyema. Postoperative.

EXAM:
PORTABLE CHEST 1 VIEW

[chest ap]
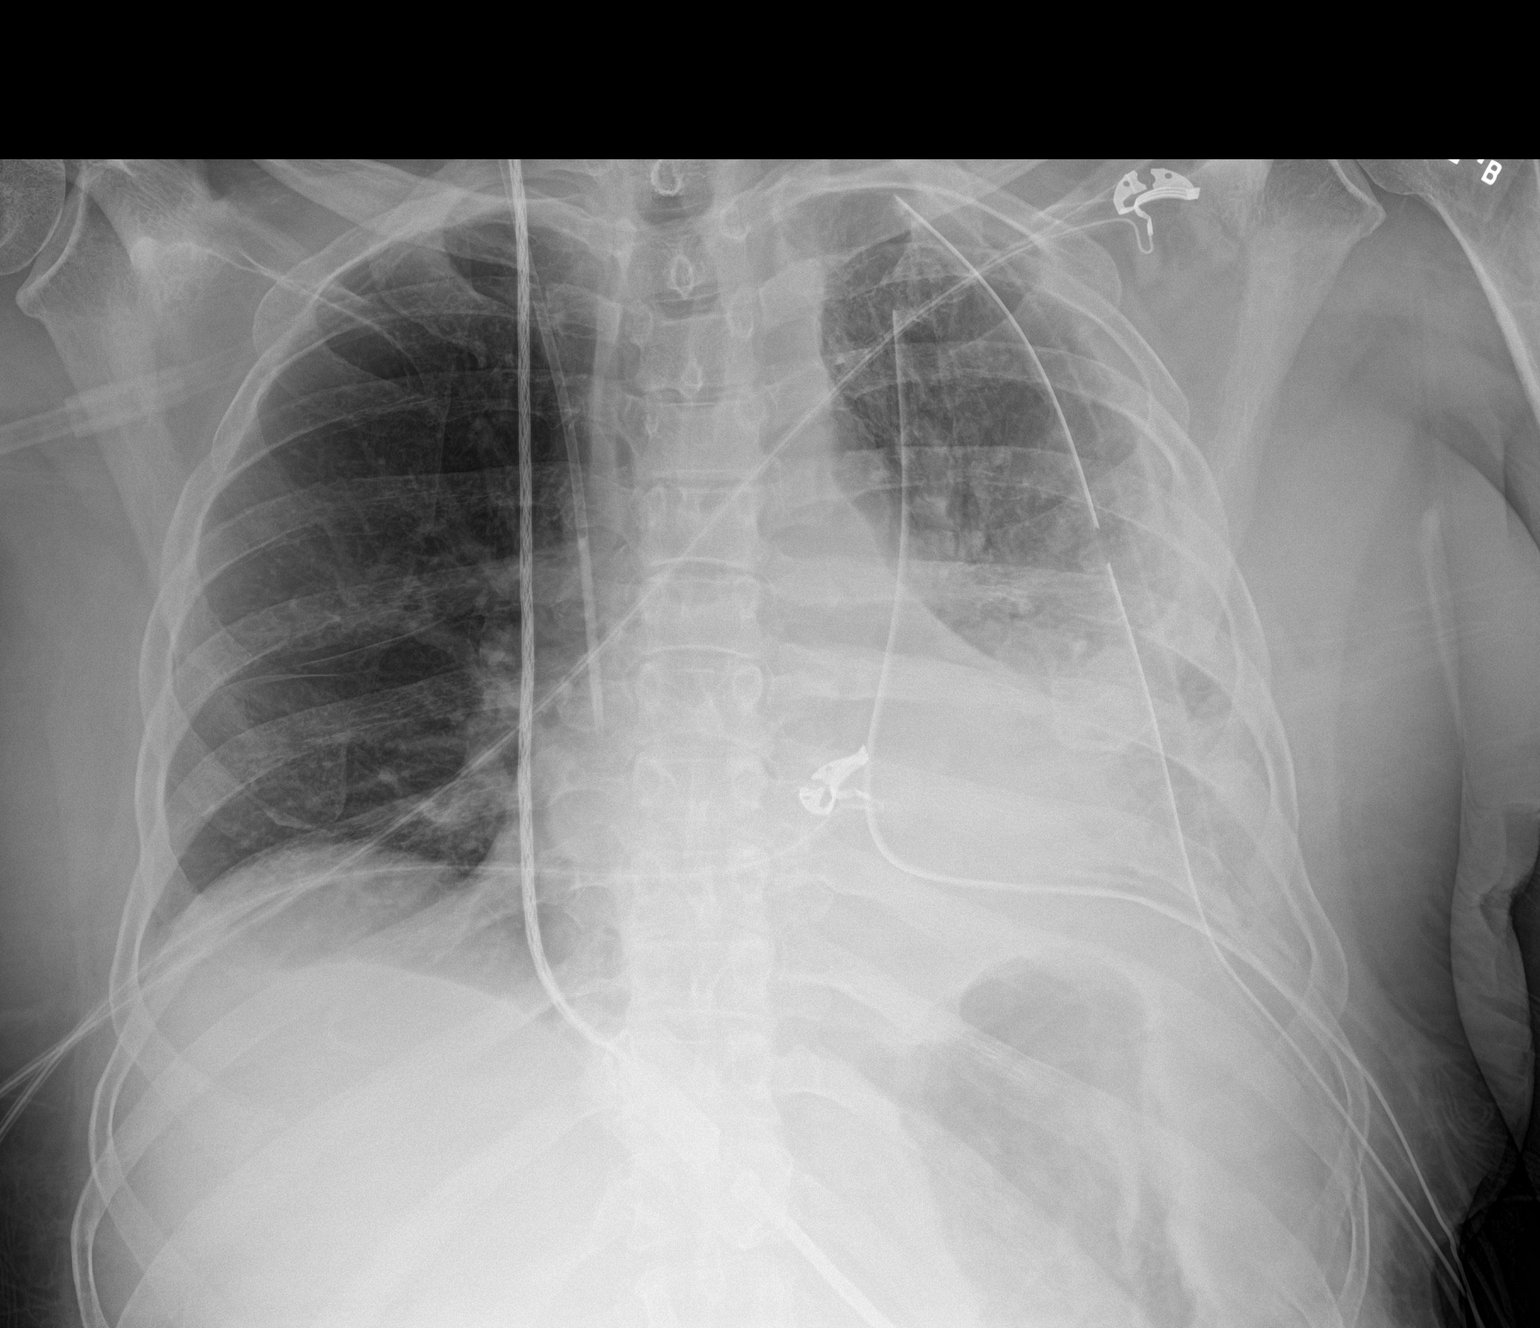

[1 of 1 positions shown; findings below may reference images not displayed]

FINDINGS: Two left apical chest tubes are in place. Right internal jugular
central venous catheter terminates at the cavoatrial junction.
Stable cardiomediastinal silhouette with top-normal heart size. No
pneumothorax. No right pleural effusion. Residual mild peripheral
left pleural thickening without significant residual left pleural
effusion. Improved aeration of the left lung with persistent patchy
opacity throughout the left lung. Clear right lung.
IMPRESSION: 1. No pneumothorax.
2. Residual mild peripheral left pleural thickening without
significant residual left pleural effusion.
3. Improved aeration of the left lung with persistent patchy opacity
throughout the left lung.

## 2018-12-28 IMAGING — DX DG CHEST 1V PORT
1 series · 1 of 1 positions shown · non-contrast
Comparison: the previous day's study

CLINICAL DATA: Empyema of pleural space ,,chest tube present

EXAM:
PORTABLE CHEST - 1 VIEW

[chest ap]
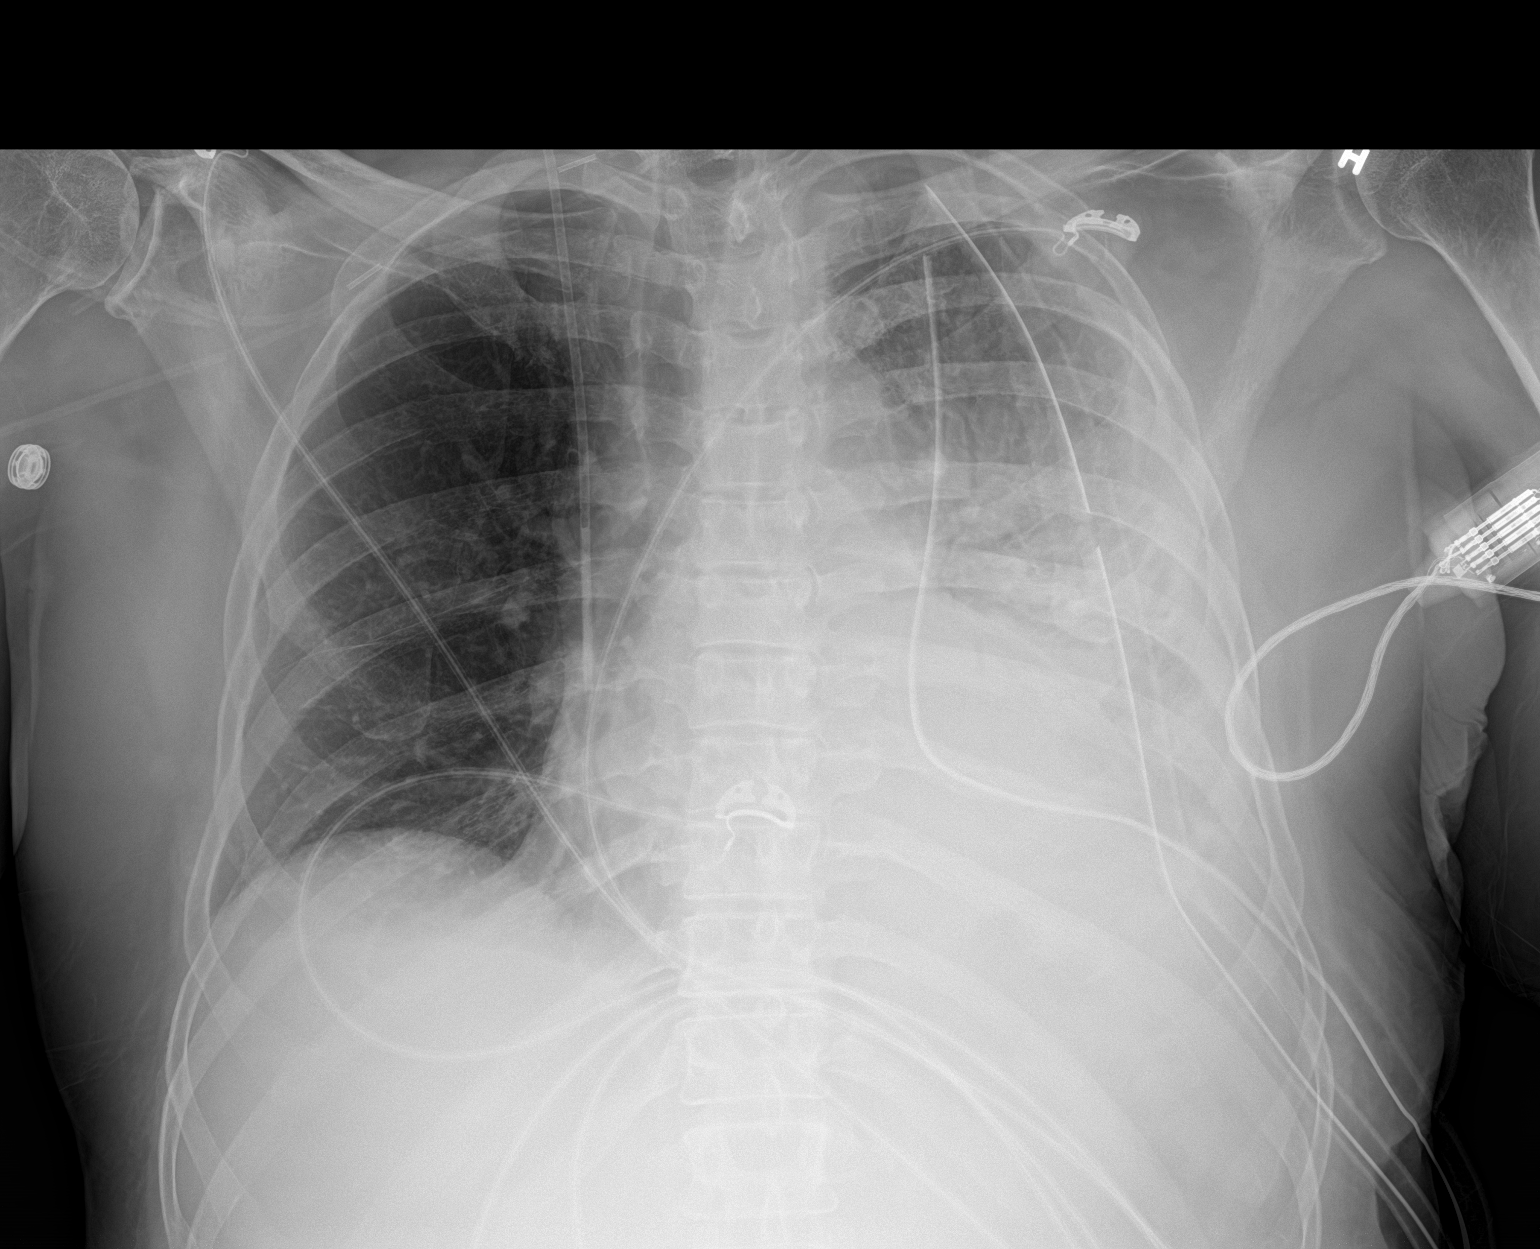

[1 of 1 positions shown; findings below may reference images not displayed]

FINDINGS: Stable left chest tubes, directed towards the apex. No pneumothorax.
Residual pleural thickening or layering effusion. Stable
atelectasis/consolidation in the mid and lower left lung. Right lung
remains clear.

Stable right IJ central line.  Heart size within normal limits.

Visualized bones unremarkable.
IMPRESSION: Stable appearance since previous day's exam

## 2018-12-29 IMAGING — DX DG CHEST 1V PORT
1 series · 1 of 1 positions shown · non-contrast
Comparison: 07/08/2018.

CLINICAL DATA: Chest tube.  Shortness of breath.  Pleural effusion.

EXAM:
PORTABLE CHEST 1 VIEW

[chest]
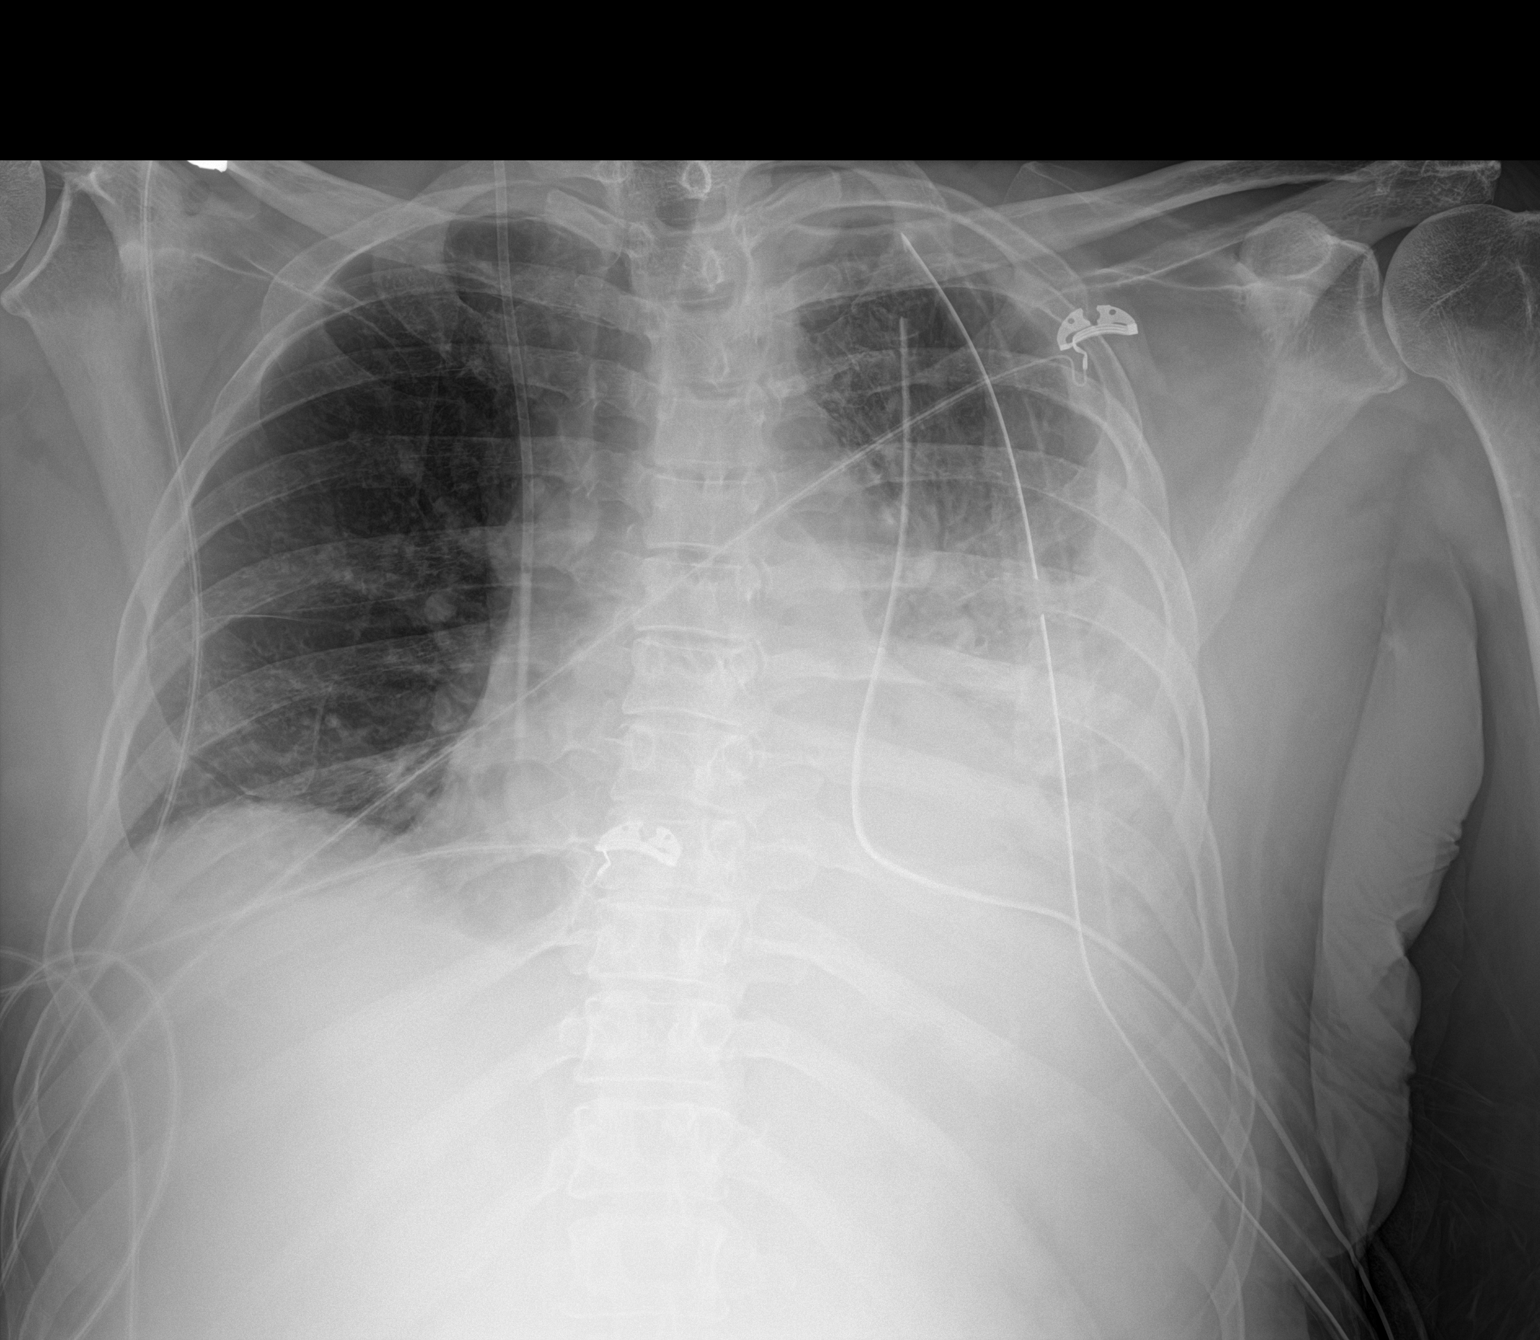

[1 of 1 positions shown; findings below may reference images not displayed]

FINDINGS: Left chest tube, right IJ line stable position. Stable cardiomegaly.
Stable diffuse left lung infiltrate and left-sided pleural effusion.
No pneumothorax. Chest is unchanged from prior exam.
IMPRESSION: 1. Stable line and tube placements including 2 left chest tubes. No
pneumothorax.

2. Diffuse left lung infiltrate left-sided pleural effusion. No
interim change from prior exam.

## 2019-04-18 ENCOUNTER — Inpatient Hospital Stay (HOSPITAL_COMMUNITY)
Admission: EM | Admit: 2019-04-18 | Discharge: 2019-04-20 | DRG: 641 | Disposition: A | Discharge status: Home Health | Payer: Medicare Other | Source: Ambulatory Visit | Attending: Internal Medicine | Admitting: Internal Medicine

## 2019-04-18 ENCOUNTER — Encounter (HOSPITAL_COMMUNITY): Payer: Self-pay | Admitting: Emergency Medicine

## 2019-04-18 ENCOUNTER — Other Ambulatory Visit: Payer: Self-pay

## 2019-04-18 DIAGNOSIS — E872 Acidosis, unspecified: Secondary | ICD-10-CM | POA: Diagnosis present

## 2019-04-18 DIAGNOSIS — Z79899 Other long term (current) drug therapy: Secondary | ICD-10-CM | POA: Diagnosis not present

## 2019-04-18 DIAGNOSIS — Z820 Family history of epilepsy and other diseases of the nervous system: Secondary | ICD-10-CM | POA: Diagnosis not present

## 2019-04-18 DIAGNOSIS — G40909 Epilepsy, unspecified, not intractable, without status epilepticus: Secondary | ICD-10-CM | POA: Diagnosis present

## 2019-04-18 DIAGNOSIS — K649 Unspecified hemorrhoids: Secondary | ICD-10-CM | POA: Diagnosis present

## 2019-04-18 DIAGNOSIS — Y900 Blood alcohol level of less than 20 mg/100 ml: Secondary | ICD-10-CM | POA: Diagnosis present

## 2019-04-18 DIAGNOSIS — E785 Hyperlipidemia, unspecified: Secondary | ICD-10-CM | POA: Diagnosis not present

## 2019-04-18 DIAGNOSIS — Z7289 Other problems related to lifestyle: Secondary | ICD-10-CM

## 2019-04-18 DIAGNOSIS — E8729 Other acidosis: Secondary | ICD-10-CM

## 2019-04-18 DIAGNOSIS — Z1159 Encounter for screening for other viral diseases: Secondary | ICD-10-CM

## 2019-04-18 DIAGNOSIS — Z8249 Family history of ischemic heart disease and other diseases of the circulatory system: Secondary | ICD-10-CM

## 2019-04-18 DIAGNOSIS — E871 Hypo-osmolality and hyponatremia: Secondary | ICD-10-CM | POA: Diagnosis present

## 2019-04-18 DIAGNOSIS — D649 Anemia, unspecified: Secondary | ICD-10-CM | POA: Diagnosis present

## 2019-04-18 DIAGNOSIS — N179 Acute kidney failure, unspecified: Secondary | ICD-10-CM

## 2019-04-18 DIAGNOSIS — Z888 Allergy status to other drugs, medicaments and biological substances status: Secondary | ICD-10-CM

## 2019-04-18 DIAGNOSIS — E8889 Other specified metabolic disorders: Secondary | ICD-10-CM

## 2019-04-18 DIAGNOSIS — Z8546 Personal history of malignant neoplasm of prostate: Secondary | ICD-10-CM

## 2019-04-18 DIAGNOSIS — E7800 Pure hypercholesterolemia, unspecified: Secondary | ICD-10-CM | POA: Diagnosis present

## 2019-04-18 DIAGNOSIS — R569 Unspecified convulsions: Secondary | ICD-10-CM | POA: Diagnosis not present

## 2019-04-18 DIAGNOSIS — Z823 Family history of stroke: Secondary | ICD-10-CM | POA: Diagnosis not present

## 2019-04-18 DIAGNOSIS — I10 Essential (primary) hypertension: Secondary | ICD-10-CM

## 2019-04-18 DIAGNOSIS — E860 Dehydration: Principal | ICD-10-CM | POA: Diagnosis present

## 2019-04-18 DIAGNOSIS — R531 Weakness: Secondary | ICD-10-CM | POA: Diagnosis not present

## 2019-04-18 DIAGNOSIS — F1020 Alcohol dependence, uncomplicated: Secondary | ICD-10-CM

## 2019-04-18 DIAGNOSIS — Z8042 Family history of malignant neoplasm of prostate: Secondary | ICD-10-CM | POA: Diagnosis not present

## 2019-04-18 DIAGNOSIS — Z789 Other specified health status: Secondary | ICD-10-CM

## 2019-04-18 LAB — CBC WITH DIFFERENTIAL/PLATELET
Abs Immature Granulocytes: 0.17 10*3/uL — ABNORMAL HIGH (ref 0.00–0.07)
Basophils Absolute: 0.1 10*3/uL (ref 0.0–0.1)
Basophils Relative: 1 %
Eosinophils Absolute: 0.3 10*3/uL (ref 0.0–0.5)
Eosinophils Relative: 3 %
HCT: 34.2 % — ABNORMAL LOW (ref 39.0–52.0)
Hemoglobin: 11.2 g/dL — ABNORMAL LOW (ref 13.0–17.0)
Immature Granulocytes: 2 %
Lymphocytes Relative: 12 %
Lymphs Abs: 1.1 10*3/uL (ref 0.7–4.0)
MCH: 30.8 pg (ref 26.0–34.0)
MCHC: 32.7 g/dL (ref 30.0–36.0)
MCV: 94 fL (ref 80.0–100.0)
Monocytes Absolute: 1.1 10*3/uL — ABNORMAL HIGH (ref 0.1–1.0)
Monocytes Relative: 12 %
Neutro Abs: 6.5 10*3/uL (ref 1.7–7.7)
Neutrophils Relative %: 70 %
Platelets: 229 10*3/uL (ref 150–400)
RBC: 3.64 MIL/uL — ABNORMAL LOW (ref 4.22–5.81)
RDW: 13.4 % (ref 11.5–15.5)
WBC: 9.1 10*3/uL (ref 4.0–10.5)
nRBC: 0 % (ref 0.0–0.2)

## 2019-04-18 LAB — URINALYSIS, ROUTINE W REFLEX MICROSCOPIC
Bilirubin Urine: NEGATIVE
Glucose, UA: NEGATIVE mg/dL
Hgb urine dipstick: NEGATIVE
Ketones, ur: 5 mg/dL — AB
Leukocytes,Ua: NEGATIVE
Nitrite: NEGATIVE
Protein, ur: NEGATIVE mg/dL
Specific Gravity, Urine: 1.01 (ref 1.005–1.030)
pH: 5 (ref 5.0–8.0)

## 2019-04-18 LAB — COMPREHENSIVE METABOLIC PANEL
ALT: 31 U/L (ref 0–44)
AST: 63 U/L — ABNORMAL HIGH (ref 15–41)
Albumin: 3.5 g/dL (ref 3.5–5.0)
Alkaline Phosphatase: 141 U/L — ABNORMAL HIGH (ref 38–126)
Anion gap: 19 — ABNORMAL HIGH (ref 5–15)
BUN: 30 mg/dL — ABNORMAL HIGH (ref 8–23)
CO2: 16 mmol/L — ABNORMAL LOW (ref 22–32)
Calcium: 8.8 mg/dL — ABNORMAL LOW (ref 8.9–10.3)
Chloride: 96 mmol/L — ABNORMAL LOW (ref 98–111)
Creatinine, Ser: 1.48 mg/dL — ABNORMAL HIGH (ref 0.61–1.24)
GFR calc Af Amer: 56 mL/min — ABNORMAL LOW (ref >60)
GFR calc non Af Amer: 48 mL/min — ABNORMAL LOW (ref >60)
Glucose, Bld: 88 mg/dL (ref 70–99)
Potassium: 4.2 mmol/L (ref 3.5–5.1)
Sodium: 131 mmol/L — ABNORMAL LOW (ref 135–145)
Total Bilirubin: 1.1 mg/dL (ref 0.3–1.2)
Total Protein: 8.4 g/dL — ABNORMAL HIGH (ref 6.5–8.1)

## 2019-04-18 LAB — MAGNESIUM: Magnesium: 1.7 mg/dL (ref 1.7–2.4)

## 2019-04-18 LAB — ACETAMINOPHEN LEVEL: Acetaminophen (Tylenol), Serum: <10 ug/mL — ABNORMAL LOW (ref 10–30)

## 2019-04-18 LAB — SALICYLATE LEVEL: Salicylate Lvl: <7 mg/dL (ref 2.8–30.0)

## 2019-04-18 LAB — BETA-HYDROXYBUTYRIC ACID: Beta-Hydroxybutyric Acid: 4.39 mmol/L — ABNORMAL HIGH (ref 0.05–0.27)

## 2019-04-18 LAB — SARS CORONAVIRUS 2 BY RT PCR (HOSPITAL ORDER, PERFORMED IN ~~LOC~~ HOSPITAL LAB): SARS Coronavirus 2: NEGATIVE

## 2019-04-18 LAB — LACTIC ACID, PLASMA
Lactic Acid, Venous: 1.8 mmol/L (ref 0.5–1.9)
Lactic Acid, Venous: 1 mmol/L (ref 0.5–1.9)

## 2019-04-18 LAB — ETHANOL: Alcohol, Ethyl (B): 18 mg/dL — ABNORMAL HIGH (ref <10)

## 2019-04-18 MED ORDER — DOCUSATE SODIUM 100 MG PO CAPS
100.0000 mg | ORAL_CAPSULE | Freq: Every morning | ORAL | Status: DC
  Administered 2019-04-19 – 2019-04-20 (×2): 100 mg via ORAL
  Filled 2019-04-18 (×2): qty 1

## 2019-04-18 MED ORDER — ENOXAPARIN SODIUM 40 MG/0.4ML ~~LOC~~ SOLN
40.0000 mg | SUBCUTANEOUS | Status: DC
  Administered 2019-04-19 (×2): 40 mg via SUBCUTANEOUS
  Filled 2019-04-18 (×2): qty 0.4

## 2019-04-18 MED ORDER — AMLODIPINE BESYLATE 5 MG PO TABS: 10.0000 mg | ORAL_TABLET | Freq: Every day | ORAL | Status: DC

## 2019-04-18 MED ORDER — FOLIC ACID 5 MG/ML IJ SOLN
1.0000 mg | Freq: Once | INTRAMUSCULAR | Status: AC
  Administered 2019-04-18: 1 mg via INTRAVENOUS

## 2019-04-18 MED ORDER — LORAZEPAM 1 MG PO TABS: 0.0000 mg | ORAL_TABLET | Freq: Four times a day (QID) | ORAL | Status: DC

## 2019-04-18 MED ORDER — LORAZEPAM 2 MG/ML IJ SOLN: 1.0000 mg | Freq: Four times a day (QID) | INTRAMUSCULAR | Status: DC | PRN

## 2019-04-18 MED ORDER — LORAZEPAM 1 MG PO TABS: 0.0000 mg | ORAL_TABLET | Freq: Two times a day (BID) | ORAL | Status: DC

## 2019-04-18 MED ORDER — SODIUM CHLORIDE 0.9 % IV BOLUS
1000.0000 mL | Freq: Once | INTRAVENOUS | Status: AC
  Administered 2019-04-18: 1000 mL via INTRAVENOUS

## 2019-04-18 MED ORDER — FOLIC ACID 1 MG PO TABS
1.0000 mg | ORAL_TABLET | Freq: Every day | ORAL | Status: DC
  Administered 2019-04-19 – 2019-04-20 (×2): 1 mg via ORAL
  Filled 2019-04-18 (×2): qty 1

## 2019-04-18 MED ORDER — ADULT MULTIVITAMIN W/MINERALS CH
1.0000 | ORAL_TABLET | Freq: Every day | ORAL | Status: DC
  Administered 2019-04-19 – 2019-04-20 (×2): 1 via ORAL
  Filled 2019-04-18 (×2): qty 1

## 2019-04-18 MED ORDER — PANTOPRAZOLE SODIUM 40 MG PO TBEC
40.0000 mg | DELAYED_RELEASE_TABLET | Freq: Every day | ORAL | Status: DC
  Administered 2019-04-19 – 2019-04-20 (×2): 40 mg via ORAL
  Filled 2019-04-18 (×2): qty 1

## 2019-04-18 MED ORDER — THIAMINE HCL 100 MG/ML IJ SOLN: 100.0000 mg | Freq: Every day | INTRAMUSCULAR | Status: DC

## 2019-04-18 MED ORDER — VITAMIN B-1 100 MG PO TABS
100.0000 mg | ORAL_TABLET | Freq: Every day | ORAL | Status: DC
  Administered 2019-04-19 – 2019-04-20 (×2): 100 mg via ORAL
  Filled 2019-04-18 (×2): qty 1

## 2019-04-18 MED ORDER — LORAZEPAM 1 MG PO TABS: 1.0000 mg | ORAL_TABLET | Freq: Four times a day (QID) | ORAL | Status: DC | PRN

## 2019-04-18 MED ORDER — ONDANSETRON HCL 4 MG PO TABS: 4.0000 mg | ORAL_TABLET | Freq: Four times a day (QID) | ORAL | Status: DC | PRN

## 2019-04-18 MED ORDER — THIAMINE HCL 100 MG/ML IJ SOLN
100.0000 mg | Freq: Once | INTRAMUSCULAR | Status: AC
  Administered 2019-04-18: 100 mg via INTRAVENOUS
  Filled 2019-04-18: qty 2

## 2019-04-18 MED ORDER — KCL IN DEXTROSE-NACL 40-5-0.9 MEQ/L-%-% IV SOLN
INTRAVENOUS | Status: DC
  Administered 2019-04-18: 22:00:00 via INTRAVENOUS
  Filled 2019-04-18 (×6): qty 1000

## 2019-04-18 MED ORDER — ONDANSETRON HCL 4 MG/2ML IJ SOLN: 4.0000 mg | Freq: Four times a day (QID) | INTRAMUSCULAR | Status: DC | PRN

## 2019-04-18 NOTE — ED Triage Notes (Signed)
Chris informed of orders placed by PA.

## 2019-04-18 NOTE — Progress Notes (Signed)
Patient ID: Clifford [name], male   DOB: Jan 17, 1951, 68 y.o.   MRN: [id number]  Beta hydroxybutyric acid elevated - in setting of decreased oral intake, normal blood sugar, and alcohol consumption, this is secondary to alcoholic and fasting ketoacidosis. Will continue with D5 NS. Check BMP q 4 hours.  Truett Mainland, DO 04/18/2019 10:57 PM

## 2019-04-18 NOTE — H&P (Signed)
History and Physical  Clifford [name] BJS:[id number] DOB: Apr 03, 1951 DOA: 04/18/2019  Referring physician: Martinique Robinson, PA-C, ED provider PCP: [name], FNP  Outpatient Specialists: none  Patient Coming From: home  Chief Complaint: Weakness  HPI: Clifford [name] is a 68 y.o. male with a history of alcoholism, hypertension, hyperlipidemia, prostate cancer, seizure disorder.  Patient seen due to 10 days of increasing weakness, decreased appetite, decreased oral intake.  His symptoms have been worsening, especially over the past several days.  No palliating or provoking factors.  He denies abdominal pain, nausea, vomiting, hematemesis, diarrhea, constipation, blood in his stool, fevers, cough, changes in smell or taste.  Patient denies ingestion of aspirin, Tylenol, methanol, rubbing alcohol.  Patient has been continuing to drink alcohol, but this has also decreased to about 4 drinks of liquor per day.  He went to his primary care physician, who sent him here for evaluation due to possible changes in his electrolytes.  Emergency Department Course: CMP shows gap metabolic acidosis.  Hemoglobin 11.2.  Review of Systems:   Pt denies any fevers, chills, nausea, vomiting, diarrhea, constipation, abdominal pain, shortness of breath, dyspnea on exertion, orthopnea, cough, wheezing, palpitations, headache, vision changes, lightheadedness, dizziness, melena, rectal bleeding.  Review of systems are otherwise negative  Past Medical History:  Diagnosis Date  . Alcoholism ([place])   . Anemia   . High cholesterol   . Hypertension   . Prostate cancer ([place])   . Seizures ([place])    Past Surgical History:  Procedure Laterality Date  . CIRCUMCISION    . COLONOSCOPY N/A 09/13/2016   Procedure: COLONOSCOPY;  Surgeon: Rogene Houston, MD;  [facility];  Service: Endoscopy;  Laterality: N/A;  2:15  . COLONOSCOPY N/A 04/06/2018   Procedure: COLONOSCOPY;  Surgeon: Rogene Houston, MD;   [facility];  Service: Endoscopy;  Laterality: N/A;  . FLEXIBLE SIGMOIDOSCOPY N/A 01/10/2018   Procedure: FLEXIBLE SIGMOIDOSCOPY;  Surgeon: Rogene Houston, MD;  [facility];  Service: Endoscopy;  Laterality: N/A;  1:55  . FLEXIBLE SIGMOIDOSCOPY N/A 06/08/2018   Procedure: FLEXIBLE SIGMOIDOSCOPY;  Surgeon: Danie Binder, MD;  [facility];  Service: Endoscopy;  Laterality: N/A;  . HEMORRHOID SURGERY N/A 04/09/2018   Procedure: HEMORRHOIDECTOMY;  Surgeon: Aviva Signs, MD;  Location: [facility];  Service: General;  Laterality: N/A;  . NO PAST SURGERIES    . POLYPECTOMY  04/06/2018   Procedure: POLYPECTOMY;  Surgeon: Rogene Houston, MD;  [facility];  Service: Endoscopy;;  sigmoid  . PROSTATE BIOPSY    . VIDEO ASSISTED THORACOSCOPY (VATS)/EMPYEMA Left 07/07/2018   Procedure: VIDEO ASSISTED THORACOSCOPY (VATS)/DRAINAGE OF EMPYEMA, DECORTICATION;  Surgeon: [name], MD;  Location: [facility];  Service: Thoracic;  Laterality: Left;   Social History:  reports that he has never smoked. He has never used smokeless tobacco. He reports current alcohol use. He reports that he does not use drugs. Patient lives at home  Allergies  Allergen Reactions  . Lisinopril Swelling    Family History  Problem Relation Age of Onset  . Alzheimer's disease Mother   . Hypertension Mother   . Hypertension Father   . Stroke Father   . Prostate cancer Father   . Hypertension Brother       Prior to Admission medications   Medication Sig Start Date End Date Taking? Authorizing Provider  acetaminophen (TYLENOL) 500 MG tablet Take 1 tablet (500 mg total) by mouth every  8 (eight) hours as needed for moderate pain. 07/12/18 07/12/19 Yes Thurnell Lose, MD  amLODipine (NORVASC) 10 MG tablet Take 10 mg by mouth daily.   Yes [provider]  B-COMPLEX-C PO Take 1 tablet by mouth every morning.   Yes [provider]  folic acid (FOLVITE) 1 MG tablet  Take 1 tablet (1 mg total) by mouth daily. 06/10/18  Yes [name], Rayford Halsted, MD  Multiple Vitamin (MULTIVITAMIN WITH MINERALS) TABS tablet Take 1 tablet by mouth daily.   Yes [provider]  pantoprazole (PROTONIX) 40 MG tablet Take 40 mg by mouth daily.   Yes [provider]  thiamine 100 MG tablet Take 1 tablet (100 mg total) by mouth daily. 06/10/18  Yes [name], Rayford Halsted, MD  docusate sodium (STOOL SOFTENER) 100 MG capsule Take 100 mg by mouth every morning.     [provider]    Physical Exam: BP (!) 155/100   Pulse 88   Temp 98.4 F (36.9 C) (Oral)   Resp 19   Ht 5\' 4"  (1.626 m)   Wt 64.4 kg   SpO2 98%   BMI 24.37 kg/m   . General: Older black male. Awake and alert and oriented x3. No acute cardiopulmonary distress.  Marland Kitchen HEENT: Normocephalic atraumatic.  Right and left ears normal in appearance.  Pupils equal, round, reactive to light. Extraocular muscles are intact. Sclerae anicteric and noninjected.  Moist mucosal membranes. No mucosal lesions.  . Neck: Neck supple without lymphadenopathy. No carotid bruits. No masses palpated.  . Cardiovascular: Regular rate with normal S1-S2 sounds. No murmurs, rubs, gallops auscultated. No JVD.  Marland Kitchen Respiratory: Good respiratory effort with no wheezes, rales, rhonchi. Lungs clear to auscultation bilaterally.  No accessory muscle use. . Abdomen: Soft, nondistended. Active bowel sounds. No masses.  Increased liver margin approximately 3 fingerbreadths below the margin . Skin: No rashes, lesions, or ulcerations.  Dry, warm to touch. 2+ dorsalis pedis and radial pulses. . Musculoskeletal: No calf or leg pain. All major joints not erythematous nontender.  No upper or lower joint deformation.  Good ROM.  No contractures  . Psychiatric: Intact judgment and insight. Pleasant and cooperative. . Neurologic: No focal neurological deficits. Strength is 5/5 and symmetric in upper and lower extremities.  Cranial nerves  II through XII are grossly intact.           Labs on Admission: I have personally reviewed following labs and imaging studies  CBC: Recent Labs  Lab 04/18/19 1736  WBC 9.1  NEUTROABS 6.5  HGB 11.2*  HCT 34.2*  MCV 94.0  PLT 539   Basic Metabolic Panel: Recent Labs  Lab 04/18/19 1736  NA 131*  K 4.2  CL 96*  CO2 16*  GLUCOSE 88  BUN 30*  CREATININE 1.48*  CALCIUM 8.8*  MG 1.7   GFR: Estimated Creatinine Clearance: 40 mL/min (A) (by C-G formula based on SCr of 1.48 mg/dL (H)). Liver Function Tests: Recent Labs  Lab 04/18/19 1736  AST 63*  ALT 31  ALKPHOS 141*  BILITOT 1.1  PROT 8.4*  ALBUMIN 3.5   No results for input(s): LIPASE, AMYLASE in the last 168 hours. No results for input(s): AMMONIA in the last 168 hours. Coagulation Profile: No results for input(s): INR, PROTIME in the last 168 hours. Cardiac Enzymes: No results for input(s): CKTOTAL, CKMB, CKMBINDEX, TROPONINI in the last 168 hours. BNP (last 3 results) No results for input(s): PROBNP in the last 8760 hours. HbA1C:  No results for input(s): HGBA1C in the last 72 hours. CBG: No results for input(s): GLUCAP in the last 168 hours. Lipid Profile: No results for input(s): CHOL, HDL, LDLCALC, TRIG, CHOLHDL, LDLDIRECT in the last 72 hours. Thyroid Function Tests: No results for input(s): TSH, T4TOTAL, FREET4, T3FREE, THYROIDAB in the last 72 hours. Anemia Panel: No results for input(s): VITAMINB12, FOLATE, FERRITIN, TIBC, IRON, RETICCTPCT in the last 72 hours. Urine analysis:    Component Value Date/Time   COLORURINE AMBER (A) 07/06/2018 2048   APPEARANCEUR HAZY (A) 07/06/2018 2048   LABSPEC 1.021 07/06/2018 2048   PHURINE 5.0 07/06/2018 2048   GLUCOSEU NEGATIVE 07/06/2018 2048   HGBUR NEGATIVE 07/06/2018 2048   BILIRUBINUR NEGATIVE 07/06/2018 2048   KETONESUR NEGATIVE 07/06/2018 2048   PROTEINUR NEGATIVE 07/06/2018 2048   NITRITE NEGATIVE 07/06/2018 2048   LEUKOCYTESUR NEGATIVE 07/06/2018  2048   Sepsis Labs: @LABRCNTIP (procalcitonin:4,lacticidven:4) )No results found for this or any previous visit (from the past 240 hour(s)).   Radiological Exams on Admission: No results found.  EKG: Independently reviewed.  Sinus rhythm with no ST changes.  Assessment/Plan: Principal Problem:   Weakness Active Problems:   Alcoholism ([place])   Seizures ([place])   Hyperlipidemia   Essential hypertension   Metabolic acidosis   AKI (acute kidney injury) ([place])    This patient was discussed with the ED physician, including pertinent vitals, physical exam findings, labs, and imaging.  We also discussed care given by the ED provider.  1. Weakness a. Likely secondary to poor p.o. intake, alcohol ingestion, and gap metabolic acidosis b. Will have nutrition see the patient 2. Gap metabolic acidosis a. At this point, uncertain as to the primary cause of the gap metabolic acidosis, likely secondary to alcoholic ketoacidosis and starvation ketoacidosis b. Salicylates level is negative.   c. Will check Tylenol level, methanol and isopropyl alcohol levels d. Check beta hydroxybutyric acid and lactic acid levels e. Patient given thiamine and folic acid in the ED f. We will start the patient on D5 normal saline with potassium g. Check BMP tonight and every 4 hours for improvement of his gap metabolic acidosis. 3. AKI a. Recheck creatinine in the morning b. Continue with hydration 4. Alcoholism a. Withdrawal protocol 5. Hypertension a. Continue home medication 6. Hyperlipidemia a. Continue home medication 7. Seizure disorder  DVT prophylaxis: Lovenox Consultants: None Code Status: Full code Family Communication: None Disposition Plan: We able to return home following admission   Truett Mainland, DO

## 2019-04-18 NOTE — ED Triage Notes (Signed)
Pt states that he was sent from [facility] for low potassium.

## 2019-04-18 NOTE — ED Provider Notes (Signed)
[facility] EMERGENCY DEPARTMENT Provider Note   CSN: [id number] Arrival date & time: 04/18/19  1323    History   Chief Complaint Chief Complaint  Patient presents with  . Abnormal Lab  . Weakness    HPI Clifford [name] is a 68 y.o. male past medical history of alcohol abuse, hypertension, hyperlipidemia, GI bleed, presenting to the ED from PCP with abnormal lab.  Patient reports he thinks he was told his potassium was low and maybe his sodium.  He was seen earlier today for generalized weakness that has been worsening for about 1 week.  He has had assoc decreased appetite. He states otherwise he does not feel ill.  He feels worse when he is exerting himself.  Denies new swelling, or other complaints.  He does drink alcohol daily, about 4 liquor drinks per day.  He does endorse alcohol today, no other ingestions. Of note, patient reports he was treated for herpes zoster his right abdomen 1 week ago, and the rash is improving.     The history is provided by the patient.    Past Medical History:  Diagnosis Date  . Alcoholism ([place])   . Anemia   . High cholesterol   . Hypertension   . Prostate cancer ([place])   . Seizures [facility]     Patient Active Problem List   Diagnosis Date Noted  . Weakness 04/18/2019  . Metabolic acidosis 61/95/0932  . AKI (acute kidney injury) ([place]) 04/18/2019  . Bleeding hemorrhoid   . Thrombocytopenia ([place]) 04/08/2018  . Malignant neoplasm of prostate ([place]) 12/04/2016  . Anemia in other chronic diseases classified elsewhere 07/11/2016  . Alcoholism ([place]) 07/04/2016  . Seizures ([place]) 07/04/2016  . Hyperlipidemia 07/04/2016  . Essential hypertension 07/04/2016    Past Surgical History:  Procedure Laterality Date  . CIRCUMCISION    . COLONOSCOPY N/A 09/13/2016   Procedure: COLONOSCOPY;  Surgeon: Rogene Houston, MD;  [facility];  Service: Endoscopy;  Laterality: N/A;  2:15  . COLONOSCOPY N/A 04/06/2018   Procedure: COLONOSCOPY;   Surgeon: Rogene Houston, MD;  [facility];  Service: Endoscopy;  Laterality: N/A;  . FLEXIBLE SIGMOIDOSCOPY N/A 01/10/2018   Procedure: FLEXIBLE SIGMOIDOSCOPY;  Surgeon: Rogene Houston, MD;  [facility];  Service: Endoscopy;  Laterality: N/A;  1:55  . FLEXIBLE SIGMOIDOSCOPY N/A 06/08/2018   Procedure: FLEXIBLE SIGMOIDOSCOPY;  Surgeon: Danie Binder, MD;  [facility];  Service: Endoscopy;  Laterality: N/A;  . HEMORRHOID SURGERY N/A 04/09/2018   Procedure: HEMORRHOIDECTOMY;  Surgeon: Aviva Signs, MD;  Location[facility];  Service: General;  Laterality: N/A;  . NO PAST SURGERIES    . POLYPECTOMY  04/06/2018   Procedure: POLYPECTOMY;  Surgeon: Rogene Houston, MD;  [facility];  Service: Endoscopy;;  sigmoid  . PROSTATE BIOPSY    . VIDEO ASSISTED THORACOSCOPY (VATS)/EMPYEMA Left 07/07/2018   Procedure: VIDEO ASSISTED THORACOSCOPY (VATS)/DRAINAGE OF EMPYEMA, DECORTICATION;  Surgeon: [name], MD;  Location: [facility];  Service: Thoracic;  Laterality: Left;        Home Medications    Prior to Admission medications   Medication Sig Start Date End Date Taking? Authorizing Provider  acetaminophen (TYLENOL) 500 MG tablet Take 1 tablet (500 mg total) by mouth every 8 (eight) hours as needed for moderate pain. 07/12/18 07/12/19 Yes Thurnell Lose, MD  amLODipine (NORVASC) 10 MG tablet Take 10 mg by mouth daily.   Yes [provider]  B-COMPLEX-C PO Take 1 tablet by mouth every morning.   Yes [provider]  folic acid (FOLVITE) 1 MG tablet Take 1 tablet (1 mg total) by mouth daily. 06/10/18  Yes [name], Rayford Halsted, MD  Multiple Vitamin (MULTIVITAMIN WITH MINERALS) TABS tablet Take 1 tablet by mouth daily.   Yes [provider]  pantoprazole (PROTONIX) 40 MG tablet Take 40 mg by mouth daily.   Yes [provider]  thiamine 100 MG tablet Take 1 tablet (100 mg total) by mouth daily. 06/10/18  Yes  [name], Rayford Halsted, MD  docusate sodium (STOOL SOFTENER) 100 MG capsule Take 100 mg by mouth every morning.     [provider]    Family History Family History  Problem Relation Age of Onset  . Alzheimer's disease Mother   . Hypertension Mother   . Hypertension Father   . Stroke Father   . Prostate cancer Father   . Hypertension Brother     Social History Social History   Tobacco Use  . Smoking status: Never Smoker  . Smokeless tobacco: Never Used  Substance Use Topics  . Alcohol use: Yes    Comment: 1-2 bottles of Gin every few days  . Drug use: No     Allergies   Lisinopril   Review of Systems Review of Systems  All other systems reviewed and are negative.    Physical Exam Updated Vital Signs BP 132/71   Pulse 87   Temp 98.4 F (36.9 C) (Oral)   Resp 17   Ht 5\' 4"  (1.626 m)   Wt 64.4 kg   SpO2 98%   BMI 24.37 kg/m   Physical Exam Vitals signs and nursing note reviewed.  Constitutional:      General: He is not in acute distress.    Appearance: He is well-developed.  HENT:     Head: Normocephalic and atraumatic.  Eyes:     Conjunctiva/sclera: Conjunctivae normal.  Cardiovascular:     Rate and Rhythm: Normal rate and regular rhythm.  Pulmonary:     Effort: Pulmonary effort is normal. No respiratory distress.     Breath sounds: Normal breath sounds.  Abdominal:     General: Bowel sounds are normal.     Palpations: Abdomen is soft.     Tenderness: There is no abdominal tenderness. There is no guarding.     Comments: There is a scabbed rash present to the right abdomen that appears to follow a dermatome.  Nontender.  Musculoskeletal:     Right lower leg: No edema.     Left lower leg: No edema.  Skin:    General: Skin is warm.  Neurological:     Mental Status: He is alert and oriented to person, place, and time.  Psychiatric:        Behavior: Behavior normal.      ED Treatments / Results  Labs (all labs ordered are  listed, but only abnormal results are displayed) Labs Reviewed  CBC WITH DIFFERENTIAL/PLATELET - Abnormal; Notable for the following components:      Result Value   RBC 3.64 (*)    Hemoglobin 11.2 (*)    HCT 34.2 (*)    Monocytes Absolute 1.1 (*)    Abs Immature Granulocytes 0.17 (*)    All other components within normal limits  COMPREHENSIVE METABOLIC PANEL - Abnormal; Notable for the following components:   Sodium 131 (*)    Chloride 96 (*)    CO2 16 (*)  BUN 30 (*)    Creatinine, Ser 1.48 (*)    Calcium 8.8 (*)    Total Protein 8.4 (*)    AST 63 (*)    Alkaline Phosphatase 141 (*)    GFR calc non Af Amer 48 (*)    GFR calc Af Amer 56 (*)    Anion gap 19 (*)    All other components within normal limits  URINALYSIS, ROUTINE W REFLEX MICROSCOPIC - Abnormal; Notable for the following components:   Ketones, ur 5 (*)    All other components within normal limits  ETHANOL - Abnormal; Notable for the following components:   Alcohol, Ethyl (B) 18 (*)    All other components within normal limits  BETA-HYDROXYBUTYRIC ACID - Abnormal; Notable for the following components:   Beta-Hydroxybutyric Acid 4.39 (*)    All other components within normal limits  ACETAMINOPHEN LEVEL - Abnormal; Notable for the following components:   Acetaminophen (Tylenol), Serum <10 (*)    All other components within normal limits  SARS CORONAVIRUS 2 (HOSPITAL ORDER, [facility])  MAGNESIUM  SALICYLATE LEVEL  LACTIC ACID, PLASMA  LACTIC ACID, PLASMA  VOLATILES,BLD-ACETONE,ETHANOL,ISOPROP,METHANOL    EKG EKG Interpretation  Date/Time:  Friday April 18 2019 19:08:32 EDT Ventricular Rate:  90 PR Interval:    QRS Duration: 83 QT Interval:  372 QTC Calculation: 456 R Axis:   3 Text Interpretation:  Sinus rhythm Normal ECG Confirmed by Veryl Speak [phone]) on 04/18/2019 8:45:29 PM   Radiology No results found.  Procedures Procedures (including critical care time)   Medications Ordered in ED Medications  dextrose 5 % and 0.9 % NaCl with KCl 40 mEq/L infusion ( Intravenous New Bag/Given 04/18/19 2205)  sodium chloride 0.9 % bolus 1,000 mL (0 mLs Intravenous Stopped 04/18/19 2048)  sodium chloride 0.9 % bolus 1,000 mL (0 mLs Intravenous Stopped 12/22/49 8841)  folic acid injection 1 mg (1 mg Intravenous Given 04/18/19 2132)  thiamine (B-1) injection 100 mg (100 mg Intravenous Given 04/18/19 2103)     Initial Impression / Assessment and Plan / ED Course  I have reviewed the triage vital signs and the nursing notes.  Pertinent labs & imaging results that were available during my care of the patient were reviewed by me and considered in my medical decision making (see chart for details).        Patient with history of alcohol abuse, presenting to the emergency department from PCP with abnormal electrolytes and generalized weakness x1 week.  After further discussion, he does endorse decreased appetite and p.o. intake over the last week.  He denies any ingestions, only drinks liquor.  No other abdominal complaints or infectious symptoms.  Labs obtained which reveal hyponatremia of 131, normal potassium, anion gap metabolic acidosis, and AKI with creatinine of 1.48.  Some delay in IV, however patient given IV normal saline.  Patient discussed with and evaluated by Dr. Stark Jock.  At this time will admit for hyponatremia and anion gap metabolic acidosis.  May be secondary to decreased p.o. intake.  Dr. Nehemiah Settle accepting admission.  Final Clinical Impressions(s) / ED Diagnoses   Final diagnoses:  Hyponatremia  High anion gap metabolic acidosis  Alcohol use    ED Discharge Orders    None       , [place] N, PA-C 04/18/19 2302    Veryl Speak, MD 04/22/19 2338

## 2019-04-18 NOTE — ED Triage Notes (Signed)
Pt states that he has been weak that is why he went to the doctor in the first place.

## 2019-04-19 DIAGNOSIS — E872 Acidosis: Secondary | ICD-10-CM

## 2019-04-19 DIAGNOSIS — E8889 Other specified metabolic disorders: Secondary | ICD-10-CM

## 2019-04-19 DIAGNOSIS — E871 Hypo-osmolality and hyponatremia: Secondary | ICD-10-CM

## 2019-04-19 LAB — RAPID URINE DRUG SCREEN, HOSP PERFORMED
Amphetamines: NOT DETECTED
Barbiturates: NOT DETECTED
Benzodiazepines: NOT DETECTED
Cocaine: NOT DETECTED
Opiates: NOT DETECTED
Tetrahydrocannabinol: NOT DETECTED

## 2019-04-19 LAB — BASIC METABOLIC PANEL
Anion gap: 13 (ref 5–15)
Anion gap: 14 (ref 5–15)
Anion gap: 15 (ref 5–15)
BUN: 23 mg/dL (ref 8–23)
BUN: 26 mg/dL — ABNORMAL HIGH (ref 8–23)
BUN: 28 mg/dL — ABNORMAL HIGH (ref 8–23)
CO2: 17 mmol/L — ABNORMAL LOW (ref 22–32)
CO2: 17 mmol/L — ABNORMAL LOW (ref 22–32)
CO2: 18 mmol/L — ABNORMAL LOW (ref 22–32)
Calcium: 7.9 mg/dL — ABNORMAL LOW (ref 8.9–10.3)
Calcium: 8.3 mg/dL — ABNORMAL LOW (ref 8.9–10.3)
Calcium: 8.4 mg/dL — ABNORMAL LOW (ref 8.9–10.3)
Chloride: 104 mmol/L (ref 98–111)
Chloride: 104 mmol/L (ref 98–111)
Chloride: 106 mmol/L (ref 98–111)
Creatinine, Ser: 1.23 mg/dL (ref 0.61–1.24)
Creatinine, Ser: 1.28 mg/dL — ABNORMAL HIGH (ref 0.61–1.24)
Creatinine, Ser: 1.29 mg/dL — ABNORMAL HIGH (ref 0.61–1.24)
GFR calc Af Amer: >60 mL/min (ref >60)
GFR calc Af Amer: >60 mL/min (ref >60)
GFR calc Af Amer: >60 mL/min (ref >60)
GFR calc non Af Amer: 57 mL/min — ABNORMAL LOW (ref >60)
GFR calc non Af Amer: 57 mL/min — ABNORMAL LOW (ref >60)
GFR calc non Af Amer: 60 mL/min — ABNORMAL LOW (ref >60)
Glucose, Bld: 106 mg/dL — ABNORMAL HIGH (ref 70–99)
Glucose, Bld: 88 mg/dL (ref 70–99)
Glucose, Bld: 89 mg/dL (ref 70–99)
Potassium: 4.6 mmol/L (ref 3.5–5.1)
Potassium: 4.7 mmol/L (ref 3.5–5.1)
Potassium: 5.2 mmol/L — ABNORMAL HIGH (ref 3.5–5.1)
Sodium: 134 mmol/L — ABNORMAL LOW (ref 135–145)
Sodium: 137 mmol/L (ref 135–145)
Sodium: 137 mmol/L (ref 135–145)

## 2019-04-19 LAB — CBC
HCT: 30.9 % — ABNORMAL LOW (ref 39.0–52.0)
Hemoglobin: 10 g/dL — ABNORMAL LOW (ref 13.0–17.0)
MCH: 31 pg (ref 26.0–34.0)
MCHC: 32.4 g/dL (ref 30.0–36.0)
MCV: 95.7 fL (ref 80.0–100.0)
Platelets: 201 10*3/uL (ref 150–400)
RBC: 3.23 MIL/uL — ABNORMAL LOW (ref 4.22–5.81)
RDW: 13.4 % (ref 11.5–15.5)
WBC: 7.8 10*3/uL (ref 4.0–10.5)
nRBC: 0 % (ref 0.0–0.2)

## 2019-04-19 LAB — T4, FREE: Free T4: 1.38 ng/dL (ref 0.82–1.77)

## 2019-04-19 LAB — MAGNESIUM: Magnesium: 1.6 mg/dL — ABNORMAL LOW (ref 1.7–2.4)

## 2019-04-19 LAB — AMMONIA: Ammonia: 24 umol/L (ref 9–35)

## 2019-04-19 LAB — MRSA PCR SCREENING: MRSA by PCR: NEGATIVE

## 2019-04-19 LAB — VITAMIN B12: Vitamin B-12: 800 pg/mL (ref 180–914)

## 2019-04-19 LAB — CK: Total CK: 23 U/L — ABNORMAL LOW (ref 49–397)

## 2019-04-19 LAB — TSH: TSH: 6.765 u[IU]/mL — ABNORMAL HIGH (ref 0.350–4.500)

## 2019-04-19 LAB — FOLATE: Folate: 53.6 ng/mL (ref >5.9)

## 2019-04-19 MED ORDER — SODIUM CHLORIDE 0.9 % IV SOLN
INTRAVENOUS | Status: AC
  Administered 2019-04-19 – 2019-04-20 (×3): via INTRAVENOUS

## 2019-04-19 MED ORDER — AMLODIPINE BESYLATE 5 MG PO TABS
5.0000 mg | ORAL_TABLET | Freq: Every day | ORAL | Status: DC
  Administered 2019-04-19 – 2019-04-20 (×2): 5 mg via ORAL
  Filled 2019-04-19 (×2): qty 1

## 2019-04-19 MED ORDER — ENSURE ENLIVE PO LIQD
237.0000 mL | Freq: Three times a day (TID) | ORAL | Status: DC
  Administered 2019-04-19 – 2019-04-20 (×2): 237 mL via ORAL

## 2019-04-19 MED ORDER — CHLORHEXIDINE GLUCONATE CLOTH 2 % EX PADS: 6.0000 | MEDICATED_PAD | Freq: Every day | CUTANEOUS | Status: DC

## 2019-04-19 MED ORDER — MAGNESIUM SULFATE 2 GM/50ML IV SOLN
2.0000 g | Freq: Once | INTRAVENOUS | Status: AC
  Administered 2019-04-19: 2 g via INTRAVENOUS
  Filled 2019-04-19: qty 50

## 2019-04-19 NOTE — Progress Notes (Signed)
PROGRESS NOTE  Clifford [name] OLM:[id number] DOB: [birth year] DOA: 04/18/2019 PCP: [name], FNP  Brief History:  68 year old male with a history of alcohol dependence, hypertension, hyperlipidemia, seizure disorder, prostate cancer, hemorrhoidal bleed presenting with 10 days of generalized weakness and decreased oral.  The patient was seen by his primary care provider on 04/18/2019.  Routine blood work was obtained.  Apparently, the patient was contacted by his primary care provider office to go to the emergency department because of electrolyte abnormalities.  He stated that some of his electrolytes were "low".  The patient denied any fevers, chills, headache, sore throat, chest pain, shortness breath, coughing, hemoptysis, nausea, vomiting, diarrhea, abdominal pain.  He continues to drink 1 pint of liquor on a daily basis.  He denies any tobacco or other illegal drug use.  In the emergency department, the patient was afebrile hemodynamically stable saturating 98% on room air.  BMP showed a serum creatinine of 1.31 with serum creatinine 1.48.  CBC was essentially unremarkable with hemoglobin of 10.0 which is at his baseline.  Lactic acid was 1.8.  Alcohol level was 18.  Urinalysis was negative for pyuria but showed ketonuria.  The patient was started on IV fluids, and work-up was initiated  Assessment/Plan: Alcoholic Ketosis -This likely explains the patient's gap metabolic acidosis in combination with AKI -Continue IV fluids -Start Protonix  AKI -baseline creatinine 0.8-0.9 -serum creatinine peaked 1.48 -continue IVF -UA otherwise bland  Generalized weakness -PT eval -TSH -E10 -folic acid -CK -UDS -personally reviewed EKG--sinus, nonspecific T wave changes  Alcohol dependence -alcohol withdrawal protocol  Essential hypertension -Continue amlodipine at reduced dose    Disposition Plan:   Home in 1-2 days  Family Communication:  No  Family at bedside   Consultants:  none  Code Status:  FULL   DVT Prophylaxis:  [place] Lovenox   Procedures: As Listed in Progress Note Above  Antibiotics: None     Subjective: Pt continues to feel weak but a little better.  Patient denies fevers, chills, headache, chest pain, dyspnea, nausea, vomiting, diarrhea, abdominal pain, dysuria, hematuria, hematochezia, and melena.   Objective: Vitals:   04/19/19 0000 04/19/19 0100 04/19/19 0441 04/19/19 0802  BP: 131/67 127/62  (!) 150/76  Pulse: 81 81  80  Resp:  (!) 21  (!) 25  Temp:   98.3 F (36.8 C) 98.8 F (37.1 C)  TempSrc:    Oral  SpO2:  98%  100%  Weight:   64.1 kg   Height:        Intake/Output Summary (Last 24 hours) at 04/19/2019 0910 Last data filed at 04/19/2019 0209 Gross per 24 hour  Intake 2000 ml  Output 200 ml  Net 1800 ml   Weight change:  Exam:   General:  Pt is alert, follows commands appropriately, not in acute distress  HEENT: No icterus, No thrush, No neck mass, /AT  Cardiovascular: RRR, S1/S2, no rubs, no gallops  Respiratory: bibasilar crackles,no wheeze  Abdomen: Soft/+BS, non tender, non distended, no guarding  Extremities: No edema, No lymphangitis, No petechiae, No rashes, no synovitis  Neuro:  CN II-XII intact, strength 4/5 in RUE, RLE, strength 4/5 LUE, LLE; sensation intact bilateral; no dysmetria; babinski equivocal     Data Reviewed: I have personally reviewed following labs and imaging studies Basic Metabolic Panel: Recent Labs  Lab 04/18/19 1736 04/18/19 2343 04/19/19 0418  NA 131* 134* 137  K 4.2 4.6 4.7  CL 96* 104 104  CO2 16* 17* 18*  GLUCOSE 88 89 88  BUN 30* 28* 26*  CREATININE 1.48* 1.29* 1.28*  CALCIUM 8.8* 7.9* 8.3*  MG 1.7  --   --    Liver Function Tests: Recent Labs  Lab 04/18/19 1736  AST 63*  ALT 31  ALKPHOS 141*  BILITOT 1.1  PROT 8.4*  ALBUMIN 3.5   No results for input(s): LIPASE, AMYLASE in the last 168 hours. No results for input(s): AMMONIA in the  last 168 hours. Coagulation Profile: No results for input(s): INR, PROTIME in the last 168 hours. CBC: Recent Labs  Lab 04/18/19 1736 04/19/19 0418  WBC 9.1 7.8  NEUTROABS 6.5  --   HGB 11.2* 10.0*  HCT 34.2* 30.9*  MCV 94.0 95.7  PLT 229 201   Cardiac Enzymes: No results for input(s): CKTOTAL, CKMB, CKMBINDEX, TROPONINI in the last 168 hours. BNP: Invalid input(s): POCBNP CBG: No results for input(s): GLUCAP in the last 168 hours. HbA1C: No results for input(s): HGBA1C in the last 72 hours. Urine analysis:    Component Value Date/Time   COLORURINE YELLOW 04/18/2019 2209   APPEARANCEUR CLEAR 04/18/2019 2209   LABSPEC 1.010 04/18/2019 2209   PHURINE 5.0 04/18/2019 2209   GLUCOSEU NEGATIVE 04/18/2019 2209   HGBUR NEGATIVE 04/18/2019 2209   BILIRUBINUR NEGATIVE 04/18/2019 2209   KETONESUR 5 (A) 04/18/2019 2209   PROTEINUR NEGATIVE 04/18/2019 2209   NITRITE NEGATIVE 04/18/2019 2209   LEUKOCYTESUR NEGATIVE 04/18/2019 2209   Sepsis Labs: @LABRCNTIP (procalcitonin:4,lacticidven:4) ) Recent Results (from the past 240 hour(s))  SARS Coronavirus 2 (CEPHEID - Performed in [place] hospital lab), Hosp Order     Status: None   Collection Time: 04/18/19  8:29 PM  Result Value Ref Range Status   SARS Coronavirus 2 NEGATIVE NEGATIVE Final    Comment: (NOTE) If result is NEGATIVE SARS-CoV-2 target nucleic acids are NOT DETECTED. The SARS-CoV-2 RNA is generally detectable in upper and lower  respiratory specimens during the acute phase of infection. The lowest  concentration of SARS-CoV-2 viral copies this assay can detect is 250  copies / mL. A negative result does not preclude SARS-CoV-2 infection  and should not be used as the sole basis for treatment or other  patient management decisions.  A negative result may occur with  improper specimen collection / handling, submission of specimen other  than nasopharyngeal swab, presence of viral mutation(s) within the  areas  targeted by this assay, and inadequate number of viral copies  (<250 copies / mL). A negative result must be combined with clinical  observations, patient history, and epidemiological information. If result is POSITIVE SARS-CoV-2 target nucleic acids are DETECTED. The SARS-CoV-2 RNA is generally detectable in upper and lower  respiratory specimens dur ing the acute phase of infection.  Positive  results are indicative of active infection with SARS-CoV-2.  Clinical  correlation with patient history and other diagnostic information is  necessary to determine patient infection status.  Positive results do  not rule out bacterial infection or co-infection with other viruses. If result is PRESUMPTIVE POSTIVE SARS-CoV-2 nucleic acids MAY BE PRESENT.   A presumptive positive result was obtained on the submitted specimen  and confirmed on repeat testing.  While 2019 novel coronavirus  (SARS-CoV-2) nucleic acids may be present in the submitted sample  additional confirmatory testing may be necessary for epidemiological  and / or clinical management purposes  to differentiate between  SARS-CoV-2 and other Sarbecovirus currently known to infect  humans.  If clinically indicated additional testing with an alternate test  methodology [phone]) is advised. The SARS-CoV-2 RNA is generally  detectable in upper and lower respiratory sp ecimens during the acute  phase of infection. The expected result is Negative. Fact Sheet for Patients:  StrictlyIdeas.no Fact Sheet for Healthcare Providers: BankingDealers.co.za This test is not yet approved or cleared by the [place] FDA and has been authorized for detection and/or diagnosis of SARS-CoV-2 by FDA under an Emergency Use Authorization (EUA).  This EUA will remain in effect (meaning this test can be used) for the duration of the COVID-19 declaration under Section 564(b)(1) of the Act, 21 U.S.C. section  360bbb-3(b)(1), unless the authorization is terminated or revoked sooner. Performed at [facility], [address], [place], [place] [address]   MRSA PCR Screening     Status: None   Collection Time: 04/18/19 11:30 PM  Result Value Ref Range Status   MRSA by PCR NEGATIVE NEGATIVE Final    Comment:        The GeneXpert MRSA Assay (FDA approved for NASAL specimens only), is one component of a comprehensive MRSA colonization surveillance program. It is not intended to diagnose MRSA infection nor to guide or monitor treatment for MRSA infections. Performed at [facility], [address] [place], [place] [address]      Scheduled Meds: . amLODipine  10 mg Oral Daily  . Chlorhexidine Gluconate Cloth  6 each Topical Q0600  . docusate sodium  100 mg Oral q morning - 10a  . enoxaparin (LOVENOX) injection  40 mg Subcutaneous Q24H  . folic acid  1 mg Oral Daily  . LORazepam  0-4 mg Oral Q6H   Followed by  . [START ON 04/20/2019] LORazepam  0-4 mg Oral Q12H  . multivitamin with minerals  1 tablet Oral Daily  . pantoprazole  40 mg Oral Daily  . thiamine  100 mg Oral Daily   Or  . thiamine  100 mg Intravenous Daily   Continuous Infusions: . dextrose 5 % and 0.9 % NaCl with KCl 40 mEq/L 100 mL/hr at 04/18/19 2205    Procedures/Studies: No results found.  Orson Eva, DO  [facility] Pager [phone]  If 7PM-7AM, please contact night-coverage [email or web address] Password [email or web address] 04/19/2019, 9:10 AM   LOS: 1 day

## 2019-04-19 NOTE — Progress Notes (Signed)
Initial Nutrition Assessment  DOCUMENTATION CODES:  Not applicable  INTERVENTION:  Ensure Enlive po TID, each supplement provides 350 kcal and 20 grams of protein  Continue MVI with min  Already on Thiamin, folate  Attempted to explain to pt importance of nutrition in preventing recurrence of these episodes  NUTRITION DIAGNOSIS:  Inadequate oral intake related to chronic illness(etoh abuse) as evidenced by pts report of only eating 1x a day and atleast 3-4 alcoholic beverages.  GOAL:  Patient will meet greater than or equal to 90% of their needs  MONITOR:  PO intake, Supplement acceptance, Labs, I & O's, Weight trends  REASON FOR ASSESSMENT:  Consult Assessment of nutrition requirement/status, Poor PO  ASSESSMENT:  68 y/o male PMHx ongoing ETOH dependence, HTN, HLD, Seizure disorder, prostate cancer. Presented w/ 10 days of weakness and decreased oral intake. In ED, CMP showed gap metabolic acidosis and UA w/ ketonuria. Admitted for AKI and gap metabolic acidosis secondary to etoh abuse  RD operating remotely on weekends. Was able to reach patient by phone. He is an extremely poor historian.   RD asked if he was eating well at home. He initially says yes. When RD asked if he ate three meals a day, he said "I try to". When RD asked an open question and asked on average how many times he ate a day, he said "probably once". He says he would had 3-4 drinks of etoh a day.   He says he does take vitamins at home, but doesn't know their names. He did not know what RD meant when he asked if he was consuming nutritional supplements (boost/ensure at home).    Weight wise, he gives a UBW og 135-142. Chart history corroborates this report. Despite his ongoing etoh abuse, pt appears to have been maintaining weight. He is currently 141.3 lbs and has been 135-145 lbs for roughly the past year.   RD explained that the reason this event occurred is because he had too much etoh in his body and  not enough nutrition. The way to avoid this from happening again is to ensure he is eating atleast 3 times a day and is staying hydrated. RD asked if he understood this and could comply; he says "ill do what I can".   While admitted, Will order mvi and protein drinks to try to optimize his nutritional status before d/c.   Labs  Na: 131-> 137, BUn/Creat: 30/1.48->23/1.23, Mg: 1.6, k:5.2 Meds: Colace, Folate, mvi with min, thiamine, ivf  Recent Labs  Lab 04/18/19 1736 04/18/19 2343 04/19/19 0418 04/19/19 0829  NA 131* 134* 137 137  K 4.2 4.6 4.7 5.2*  CL 96* 104 104 106  CO2 16* 17* 18* 17*  BUN 30* 28* 26* 23  CREATININE 1.48* 1.29* 1.28* 1.23  CALCIUM 8.8* 7.9* 8.3* 8.4*  MG 1.7  --   --  1.6*  GLUCOSE 88 89 88 106*   NUTRITION - FOCUSED PHYSICAL EXAM: Unable to conduct  Diet Order:   Diet Order            Diet Heart Room service appropriate? Yes; Fluid consistency: Thin  Diet effective now             EDUCATION NEEDS:  Education needs have been addressed  Skin:  Skin Assessment: Reviewed RN Assessment  Last BM:  Unknown  Height:  Ht Readings from Last 1 Encounters:  04/18/19 5\' 4"  (1.626 m)   Weight:  Wt Readings from Last 1 Encounters:  04/19/19  64.1 kg   Wt Readings from Last 10 Encounters:  04/19/19 64.1 kg  08/13/18 61.5 kg  07/09/18 69.9 kg  06/24/18 64.4 kg  06/07/18 63 kg  04/16/18 67.1 kg  04/06/18 67.1 kg  02/07/18 68.6 kg  01/10/18 69.4 kg  12/06/17 69.8 kg   Ideal Body Weight:  59.1 kg  BMI:  Body mass index is 24.26 kg/m.  Estimated Nutritional Needs:  Kcal:  1750-1900 (27-30 kcal/kg bw) Protein:  83-96g Pro (1.3-1.5/kg bw) Fluid:  >1.9 L (30 ml/kg bw)  Burtis Junes RD, LDN, CNSC Clinical Nutrition Available Tues-Sat via Pager: [phone] 04/19/2019 12:30 PM

## 2019-04-20 LAB — COMPREHENSIVE METABOLIC PANEL
ALT: 21 U/L (ref 0–44)
AST: 36 U/L (ref 15–41)
Albumin: 2.8 g/dL — ABNORMAL LOW (ref 3.5–5.0)
Alkaline Phosphatase: 97 U/L (ref 38–126)
Anion gap: 11 (ref 5–15)
BUN: 14 mg/dL (ref 8–23)
CO2: 20 mmol/L — ABNORMAL LOW (ref 22–32)
Calcium: 8.6 mg/dL — ABNORMAL LOW (ref 8.9–10.3)
Chloride: 105 mmol/L (ref 98–111)
Creatinine, Ser: 1.01 mg/dL (ref 0.61–1.24)
GFR calc Af Amer: >60 mL/min (ref >60)
GFR calc non Af Amer: >60 mL/min (ref >60)
Glucose, Bld: 112 mg/dL — ABNORMAL HIGH (ref 70–99)
Potassium: 4.4 mmol/L (ref 3.5–5.1)
Sodium: 136 mmol/L (ref 135–145)
Total Bilirubin: 0.8 mg/dL (ref 0.3–1.2)
Total Protein: 6.7 g/dL (ref 6.5–8.1)

## 2019-04-20 LAB — MAGNESIUM: Magnesium: 1.9 mg/dL (ref 1.7–2.4)

## 2019-04-20 NOTE — TOC Transition Note (Signed)
Transition of Care [facility]) - CM/SW Discharge Note   Patient Details  Name: TAYDEN NICHELSON MRN: [id number] Date of Birth: 1951-01-05  [facility] of [facility]) CM/SW Contact:  Latanya Maudlin, RN Phone Number: 04/20/2019, 11:47 AM   Clinical Narrative:  Patient to be discharged per MD order. Orders in place for home health services. CMS Medicare.gov Compare Post Acute Care list reviewed with patient and he has no preference of agency. Referral placed with [facility]. No DME needs.     Final next level of care: Home w Home Health Services Barriers to Discharge: No Barriers Identified   Patient Goals and CMS Choice   CMS Medicare.gov Compare Post Acute Care list provided to:: Patient Choice offered to / list presented to : Patient  Discharge Placement                       Discharge Plan and Services                          HH Arranged: RN, PT [facility]: [place] ([employer]) Date [place]: 04/20/19 Time [place]: 5277 Representative spoke with at [place]: [place] ([place]) Interventions     Readmission Risk Interventions Readmission Risk Prevention Plan 04/20/2019  Transportation Screening Complete  PCP or Specialist Appt within 5-7 Days Complete  Home Care Screening Complete  Medication Review (RN CM) Complete  Some recent data might be hidden

## 2019-04-20 NOTE — Discharge Summary (Addendum)
Physician Discharge Summary  Clifford [name] EXB:[id number] DOB: 1951/03/26 DOA: 04/18/2019  PCP: [name], FNP  Admit date: 04/18/2019 Discharge date: 04/20/2019  Admitted From: Home Disposition:  Home   Recommendations for Outpatient Follow-up:  1. Follow up with PCP in 1-2 weeks 2. Please obtain BMP/CBC in one week   Home Health: HHPT   Discharge Condition: Stable CODE STATUS: FULL Diet recommendation: Heart Healthy   Brief/Interim Summary: 68 year old male with a history of alcohol dependence, hypertension, hyperlipidemia, seizure disorder, prostate cancer, hemorrhoidal bleed presenting with 10 days of generalized weakness and decreased oral.  The patient was seen by his primary care provider on 04/18/2019.  Routine blood work was obtained.  Apparently, the patient was contacted by his primary care provider office to go to the emergency department because of electrolyte abnormalities.  He stated that some of his electrolytes were "low".  The patient denied any fevers, chills, headache, sore throat, chest pain, shortness breath, coughing, hemoptysis, nausea, vomiting, diarrhea, abdominal pain.  He continues to drink 1 pint of liquor on a daily basis.  He denies any tobacco or other illegal drug use.  In the emergency department, the patient was afebrile hemodynamically stable saturating 98% on room air.  BMP showed serum Na of 131 with serum creatinine 1.48.  CBC was essentially unremarkable with hemoglobin of 10.0 which is at his baseline.  Lactic acid was 1.8.  Alcohol level was 18.  Urinalysis was negative for pyuria but showed ketonuria.  The patient was started on IV fluids, and work-up was initiated  Discharge Diagnoses:   Alcoholic Ketosis -This likely explains the patient's gap metabolic acidosis in combination with AKI -Continue IV fluids-->resolved -Started Protonix  AKI -baseline creatinine 0.8-0.9 -serum creatinine peaked 1.48 -continue IVF -UA otherwise  bland -resolved -serum creatinine 1.01 on day of d/c  Generalized weakness -due to dehydration/hyponatremia and alcoholic ketosis -NUU--7.253; Free T4--1.38 = Euthyroid sick -G64--403 -folic KVQQ--59.5 -GL--87 -UDS--neg -personally reviewed EKG--sinus, nonspecific T wave changes -HHPT ordered  Alcohol dependence -alcohol withdrawal protocol -no withdrawal during the hospitalization  Essential hypertension -Continue amlodipine   Discharge Instructions   Allergies as of 04/20/2019      Reactions   Lisinopril Swelling      Medication List    STOP taking these medications   acetaminophen 500 MG tablet Commonly known as:  TYLENOL     TAKE these medications   amLODipine 10 MG tablet Commonly known as:  NORVASC Take 10 mg by mouth daily.   B-COMPLEX-C PO Take 1 tablet by mouth every morning.   folic acid 1 MG tablet Commonly known as:  FOLVITE Take 1 tablet (1 mg total) by mouth daily.   multivitamin with minerals Tabs tablet Take 1 tablet by mouth daily.   pantoprazole 40 MG tablet Commonly known as:  PROTONIX Take 40 mg by mouth daily.   Stool Softener 100 MG capsule Generic drug:  docusate sodium Take 100 mg by mouth every morning.   thiamine 100 MG tablet Take 1 tablet (100 mg total) by mouth daily.       Allergies  Allergen Reactions  . Lisinopril Swelling    Consultations:  none   Procedures/Studies: No results found.      Discharge Exam: Vitals:   04/19/19 2346 04/20/19 0652  BP: (!) 142/80 (!) 145/85  Pulse: 77 72  Resp: 18 20  Temp: 98.4 F (36.9 C) 98.5 F (36.9 C)  SpO2: 100% 100%   Vitals:   04/19/19 1408 04/19/19  2012 04/19/19 2346 04/20/19 0652  BP: 120/64  (!) 142/80 (!) 145/85  Pulse: 97 90 77 72  Resp: 18 16 18 20   Temp: 98.6 F (37 C)  98.4 F (36.9 C) 98.5 F (36.9 C)  TempSrc: Oral  Oral Oral  SpO2: 98% 98% 100% 100%  Weight:      Height:        General: Pt is alert, awake, not in acute  distress Cardiovascular: RRR, S1/S2 +, no rubs, no gallops Respiratory: CTA bilaterally, no wheezing, no rhonchi Abdominal: Soft, NT, ND, bowel sounds + Extremities: no edema, no cyanosis   The results of significant diagnostics from this hospitalization (including imaging, microbiology, ancillary and laboratory) are listed below for reference.    Significant Diagnostic Studies: No results found.   Microbiology: Recent Results (from the past 240 hour(s))  SARS Coronavirus 2 (CEPHEID - Performed in [place] hospital lab), Hosp Order     Status: None   Collection Time: 04/18/19  8:29 PM  Result Value Ref Range Status   SARS Coronavirus 2 NEGATIVE NEGATIVE Final    Comment: (NOTE) If result is NEGATIVE SARS-CoV-2 target nucleic acids are NOT DETECTED. The SARS-CoV-2 RNA is generally detectable in upper and lower  respiratory specimens during the acute phase of infection. The lowest  concentration of SARS-CoV-2 viral copies this assay can detect is 250  copies / mL. A negative result does not preclude SARS-CoV-2 infection  and should not be used as the sole basis for treatment or other  patient management decisions.  A negative result may occur with  improper specimen collection / handling, submission of specimen other  than nasopharyngeal swab, presence of viral mutation(s) within the  areas targeted by this assay, and inadequate number of viral copies  (<250 copies / mL). A negative result must be combined with clinical  observations, patient history, and epidemiological information. If result is POSITIVE SARS-CoV-2 target nucleic acids are DETECTED. The SARS-CoV-2 RNA is generally detectable in upper and lower  respiratory specimens dur ing the acute phase of infection.  Positive  results are indicative of active infection with SARS-CoV-2.  Clinical  correlation with patient history and other diagnostic information is  necessary to determine patient infection status.   Positive results do  not rule out bacterial infection or co-infection with other viruses. If result is PRESUMPTIVE POSTIVE SARS-CoV-2 nucleic acids MAY BE PRESENT.   A presumptive positive result was obtained on the submitted specimen  and confirmed on repeat testing.  While 2019 novel coronavirus  (SARS-CoV-2) nucleic acids may be present in the submitted sample  additional confirmatory testing may be necessary for epidemiological  and / or clinical management purposes  to differentiate between  SARS-CoV-2 and other Sarbecovirus currently known to infect humans.  If clinically indicated additional testing with an alternate test  methodology [phone]) is advised. The SARS-CoV-2 RNA is generally  detectable in upper and lower respiratory sp ecimens during the acute  phase of infection. The expected result is Negative. Fact Sheet for Patients:  StrictlyIdeas.no Fact Sheet for Healthcare Providers: BankingDealers.co.za This test is not yet approved or cleared by the [place] FDA and has been authorized for detection and/or diagnosis of SARS-CoV-2 by FDA under an Emergency Use Authorization (EUA).  This EUA will remain in effect (meaning this test can be used) for the duration of the COVID-19 declaration under Section 564(b)(1) of the Act, 21 U.S.C. section 360bbb-3(b)(1), unless the authorization is terminated or revoked sooner. Performed at [facility]  [facility], [address], [place], [place] [address]   MRSA PCR Screening     Status: None   Collection Time: 04/18/19 11:30 PM  Result Value Ref Range Status   MRSA by PCR NEGATIVE NEGATIVE Final    Comment:        The GeneXpert MRSA Assay (FDA approved for NASAL specimens only), is one component of a comprehensive MRSA colonization surveillance program. It is not intended to diagnose MRSA infection nor to guide or monitor treatment for MRSA infections. Performed at [facility],  [address], [place], [place] [address]      Labs: Basic Metabolic Panel: Recent Labs  Lab 04/18/19 1736 04/18/19 2343 04/19/19 0418 04/19/19 0829 04/20/19 0729  NA 131* 134* 137 137 136  K 4.2 4.6 4.7 5.2* 4.4  CL 96* 104 104 106 105  CO2 16* 17* 18* 17* 20*  GLUCOSE 88 89 88 106* 112*  BUN 30* 28* 26* 23 14  CREATININE 1.48* 1.29* 1.28* 1.23 1.01  CALCIUM 8.8* 7.9* 8.3* 8.4* 8.6*  MG 1.7  --   --  1.6* 1.9   Liver Function Tests: Recent Labs  Lab 04/18/19 1736 04/20/19 0729  AST 63* 36  ALT 31 21  ALKPHOS 141* 97  BILITOT 1.1 0.8  PROT 8.4* 6.7  ALBUMIN 3.5 2.8*   No results for input(s): LIPASE, AMYLASE in the last 168 hours. Recent Labs  Lab 04/19/19 0829  AMMONIA 24   CBC: Recent Labs  Lab 04/18/19 1736 04/19/19 0418  WBC 9.1 7.8  NEUTROABS 6.5  --   HGB 11.2* 10.0*  HCT 34.2* 30.9*  MCV 94.0 95.7  PLT 229 201   Cardiac Enzymes: Recent Labs  Lab 04/19/19 0829  CKTOTAL 23*   BNP: Invalid input(s): POCBNP CBG: No results for input(s): GLUCAP in the last 168 hours.  Time coordinating discharge:  36 minutes  Signed:  Orson Eva, DO [facility] Pager: [phone] 04/20/2019, 11:25 AM

## 2019-04-20 NOTE — Progress Notes (Signed)
Nsg Discharge Note  Admit Date:  04/18/2019 Discharge date: 04/20/2019   Cindee Salt to be D/C'd Home per MD order.  AVS completed.  Copy for chart, and copy for patient signed, and dated. Patient/caregiver able to verbalize understanding.  Discharge Medication: Allergies as of 04/20/2019      Reactions   Lisinopril Swelling      Medication List    STOP taking these medications   acetaminophen 500 MG tablet Commonly known as:  TYLENOL     TAKE these medications   amLODipine 10 MG tablet Commonly known as:  NORVASC Take 10 mg by mouth daily.   B-COMPLEX-C PO Take 1 tablet by mouth every morning.   folic acid 1 MG tablet Commonly known as:  FOLVITE Take 1 tablet (1 mg total) by mouth daily.   multivitamin with minerals Tabs tablet Take 1 tablet by mouth daily.   pantoprazole 40 MG tablet Commonly known as:  PROTONIX Take 40 mg by mouth daily.   Stool Softener 100 MG capsule Generic drug:  docusate sodium Take 100 mg by mouth every morning.   thiamine 100 MG tablet Take 1 tablet (100 mg total) by mouth daily.       Discharge Assessment: Vitals:   04/19/19 2346 04/20/19 0652  BP: (!) 142/80 (!) 145/85  Pulse: 77 72  Resp: 18 20  Temp: 98.4 F (36.9 C) 98.5 F (36.9 C)  SpO2: 100% 100%   Skin clean, dry and intact without evidence of skin break down, no evidence of skin tears noted. IV catheter discontinued intact. Site without signs and symptoms of complications - no redness or edema noted at insertion site, patient denies c/o pain - only slight tenderness at site.  Dressing with slight pressure applied.  D/c Instructions-Education: Discharge instructions given to patient/family with verbalized understanding. D/c education completed with patient's son Annie Main including follow up instructions, medication list, d/c activities limitations if indicated, with other d/c instructions as indicated by MD - patient able to verbalize understanding, all questions  fully answered. Patient's son  instructed to return to ED, call 911, or call MD for any changes in condition.  Patient escorted via [place], and D/C home via private auto.  Hence Derrick Loletha Grayer, RN 04/20/2019 2:07 PM

## 2019-04-21 LAB — HEPATITIS B SURFACE ANTIGEN: Hepatitis B Surface Ag: NEGATIVE

## 2019-04-21 LAB — VOLATILES,BLD-ACETONE,ETHANOL,ISOPROP,METHANOL
Acetone, blood: NEGATIVE % (ref 0.000–0.010)
Ethanol, blood: NEGATIVE % (ref 0.000–0.010)
Isopropanol, blood: NEGATIVE % (ref 0.000–0.010)
Methanol, blood: NEGATIVE % (ref 0.000–0.010)

## 2019-04-21 LAB — HEPATITIS C ANTIBODY: HCV Ab: <0.1 s/co ratio (ref 0.0–0.9)

## 2019-12-08 ENCOUNTER — Other Ambulatory Visit: Payer: Self-pay

## 2019-12-08 ENCOUNTER — Observation Stay (HOSPITAL_COMMUNITY)
Admission: EM | Admit: 2019-12-08 | Discharge: 2019-12-09 | Disposition: A | Discharge status: Home / Self Care | Payer: Medicare Other | Attending: Family Medicine | Admitting: Family Medicine

## 2019-12-08 ENCOUNTER — Encounter (HOSPITAL_COMMUNITY): Payer: Self-pay | Admitting: *Deleted

## 2019-12-08 DIAGNOSIS — R945 Abnormal results of liver function studies: Secondary | ICD-10-CM | POA: Insufficient documentation

## 2019-12-08 DIAGNOSIS — R531 Weakness: Secondary | ICD-10-CM | POA: Diagnosis present

## 2019-12-08 DIAGNOSIS — R2681 Unsteadiness on feet: Secondary | ICD-10-CM | POA: Insufficient documentation

## 2019-12-08 DIAGNOSIS — D500 Iron deficiency anemia secondary to blood loss (chronic): Secondary | ICD-10-CM | POA: Insufficient documentation

## 2019-12-08 DIAGNOSIS — I10 Essential (primary) hypertension: Secondary | ICD-10-CM | POA: Diagnosis present

## 2019-12-08 DIAGNOSIS — F1020 Alcohol dependence, uncomplicated: Secondary | ICD-10-CM | POA: Diagnosis present

## 2019-12-08 DIAGNOSIS — E785 Hyperlipidemia, unspecified: Secondary | ICD-10-CM | POA: Diagnosis not present

## 2019-12-08 DIAGNOSIS — Z20822 Contact with and (suspected) exposure to covid-19: Secondary | ICD-10-CM | POA: Insufficient documentation

## 2019-12-08 DIAGNOSIS — K625 Hemorrhage of anus and rectum: Secondary | ICD-10-CM

## 2019-12-08 DIAGNOSIS — N179 Acute kidney failure, unspecified: Principal | ICD-10-CM | POA: Diagnosis present

## 2019-12-08 DIAGNOSIS — R195 Other fecal abnormalities: Secondary | ICD-10-CM | POA: Diagnosis not present

## 2019-12-08 DIAGNOSIS — R7989 Other specified abnormal findings of blood chemistry: Secondary | ICD-10-CM | POA: Diagnosis present

## 2019-12-08 DIAGNOSIS — E871 Hypo-osmolality and hyponatremia: Secondary | ICD-10-CM | POA: Diagnosis present

## 2019-12-08 DIAGNOSIS — D649 Anemia, unspecified: Secondary | ICD-10-CM | POA: Diagnosis present

## 2019-12-08 LAB — CBC WITH DIFFERENTIAL/PLATELET
Abs Immature Granulocytes: 0.14 10*3/uL — ABNORMAL HIGH (ref 0.00–0.07)
Basophils Absolute: 0 10*3/uL (ref 0.0–0.1)
Basophils Relative: 1 %
Eosinophils Absolute: 0.2 10*3/uL (ref 0.0–0.5)
Eosinophils Relative: 2 %
HCT: 29.9 % — ABNORMAL LOW (ref 39.0–52.0)
Hemoglobin: 9.9 g/dL — ABNORMAL LOW (ref 13.0–17.0)
Immature Granulocytes: 2 %
Lymphocytes Relative: 11 %
Lymphs Abs: 0.8 10*3/uL (ref 0.7–4.0)
MCH: 31.9 pg (ref 26.0–34.0)
MCHC: 33.1 g/dL (ref 30.0–36.0)
MCV: 96.5 fL (ref 80.0–100.0)
Monocytes Absolute: 1.5 10*3/uL — ABNORMAL HIGH (ref 0.1–1.0)
Monocytes Relative: 20 %
Neutro Abs: 4.9 10*3/uL (ref 1.7–7.7)
Neutrophils Relative %: 64 %
Platelets: 229 10*3/uL (ref 150–400)
RBC: 3.1 MIL/uL — ABNORMAL LOW (ref 4.22–5.81)
RDW: 13.4 % (ref 11.5–15.5)
WBC: 7.4 10*3/uL (ref 4.0–10.5)
nRBC: 0 % (ref 0.0–0.2)

## 2019-12-08 LAB — COMPREHENSIVE METABOLIC PANEL
ALT: 64 U/L — ABNORMAL HIGH (ref 0–44)
AST: 154 U/L — ABNORMAL HIGH (ref 15–41)
Albumin: 4 g/dL (ref 3.5–5.0)
Alkaline Phosphatase: 104 U/L (ref 38–126)
Anion gap: 15 (ref 5–15)
BUN: 36 mg/dL — ABNORMAL HIGH (ref 8–23)
CO2: 20 mmol/L — ABNORMAL LOW (ref 22–32)
Calcium: 8.9 mg/dL (ref 8.9–10.3)
Chloride: 95 mmol/L — ABNORMAL LOW (ref 98–111)
Creatinine, Ser: 1.91 mg/dL — ABNORMAL HIGH (ref 0.61–1.24)
GFR calc Af Amer: 41 mL/min — ABNORMAL LOW (ref >60)
GFR calc non Af Amer: 35 mL/min — ABNORMAL LOW (ref >60)
Glucose, Bld: 132 mg/dL — ABNORMAL HIGH (ref 70–99)
Potassium: 3.5 mmol/L (ref 3.5–5.1)
Sodium: 130 mmol/L — ABNORMAL LOW (ref 135–145)
Total Bilirubin: 1.2 mg/dL (ref 0.3–1.2)
Total Protein: 8.1 g/dL (ref 6.5–8.1)

## 2019-12-08 LAB — BASIC METABOLIC PANEL
Anion gap: 13 (ref 5–15)
BUN: 34 mg/dL — ABNORMAL HIGH (ref 8–23)
CO2: 20 mmol/L — ABNORMAL LOW (ref 22–32)
Calcium: 8.4 mg/dL — ABNORMAL LOW (ref 8.9–10.3)
Chloride: 101 mmol/L (ref 98–111)
Creatinine, Ser: 1.83 mg/dL — ABNORMAL HIGH (ref 0.61–1.24)
GFR calc Af Amer: 43 mL/min — ABNORMAL LOW (ref >60)
GFR calc non Af Amer: 37 mL/min — ABNORMAL LOW (ref >60)
Glucose, Bld: 126 mg/dL — ABNORMAL HIGH (ref 70–99)
Potassium: 3 mmol/L — ABNORMAL LOW (ref 3.5–5.1)
Sodium: 134 mmol/L — ABNORMAL LOW (ref 135–145)

## 2019-12-08 LAB — LIPASE, BLOOD: Lipase: 160 U/L — ABNORMAL HIGH (ref 11–51)

## 2019-12-08 LAB — MAGNESIUM: Magnesium: 1 mg/dL — ABNORMAL LOW (ref 1.7–2.4)

## 2019-12-08 LAB — POC OCCULT BLOOD, ED: Fecal Occult Bld: POSITIVE — AB

## 2019-12-08 LAB — HEMOGLOBIN AND HEMATOCRIT, BLOOD
HCT: 30.2 % — ABNORMAL LOW (ref 39.0–52.0)
Hemoglobin: 9.9 g/dL — ABNORMAL LOW (ref 13.0–17.0)

## 2019-12-08 LAB — PHOSPHORUS: Phosphorus: 1.3 mg/dL — ABNORMAL LOW (ref 2.5–4.6)

## 2019-12-08 MED ORDER — SODIUM CHLORIDE 0.9 % IV BOLUS
500.0000 mL | Freq: Once | INTRAVENOUS | Status: AC
  Administered 2019-12-08: 500 mL via INTRAVENOUS

## 2019-12-08 MED ORDER — THIAMINE HCL 100 MG/ML IJ SOLN: 100.0000 mg | Freq: Every day | INTRAMUSCULAR | Status: DC

## 2019-12-08 MED ORDER — LORAZEPAM 2 MG/ML IJ SOLN: 1.0000 mg | INTRAMUSCULAR | Status: DC | PRN

## 2019-12-08 MED ORDER — THIAMINE HCL 100 MG PO TABS
100.0000 mg | ORAL_TABLET | Freq: Every day | ORAL | Status: DC
  Administered 2019-12-09: 100 mg via ORAL
  Filled 2019-12-08 (×2): qty 1

## 2019-12-08 MED ORDER — PROCHLORPERAZINE EDISYLATE 10 MG/2ML IJ SOLN: 5.0000 mg | INTRAMUSCULAR | Status: DC | PRN

## 2019-12-08 MED ORDER — FOLIC ACID 1 MG PO TABS
1.0000 mg | ORAL_TABLET | Freq: Every day | ORAL | Status: DC
  Administered 2019-12-08 – 2019-12-09 (×2): 1 mg via ORAL
  Filled 2019-12-08 (×2): qty 1

## 2019-12-08 MED ORDER — THIAMINE HCL 100 MG/ML IJ SOLN
100.0000 mg | Freq: Once | INTRAMUSCULAR | Status: AC
  Administered 2019-12-08: 100 mg via INTRAVENOUS
  Filled 2019-12-08: qty 2

## 2019-12-08 MED ORDER — LORAZEPAM 2 MG/ML IJ SOLN
0.0000 mg | Freq: Four times a day (QID) | INTRAMUSCULAR | Status: DC
  Administered 2019-12-08: 2 mg via INTRAVENOUS
  Filled 2019-12-08: qty 1

## 2019-12-08 MED ORDER — PANTOPRAZOLE SODIUM 40 MG IV SOLR
40.0000 mg | Freq: Two times a day (BID) | INTRAVENOUS | Status: DC
  Administered 2019-12-08: 40 mg via INTRAVENOUS
  Filled 2019-12-08: qty 40

## 2019-12-08 MED ORDER — LORAZEPAM 1 MG PO TABS: 1.0000 mg | ORAL_TABLET | ORAL | Status: DC | PRN

## 2019-12-08 MED ORDER — LORAZEPAM 2 MG/ML IJ SOLN: 0.0000 mg | Freq: Two times a day (BID) | INTRAMUSCULAR | Status: DC

## 2019-12-08 MED ORDER — SODIUM CHLORIDE 0.9 % IV BOLUS
1000.0000 mL | Freq: Once | INTRAVENOUS | Status: AC
  Administered 2019-12-08: 1000 mL via INTRAVENOUS

## 2019-12-08 MED ORDER — ADULT MULTIVITAMIN W/MINERALS CH
1.0000 | ORAL_TABLET | Freq: Every day | ORAL | Status: DC
  Administered 2019-12-08 – 2019-12-09 (×2): 1 via ORAL
  Filled 2019-12-08 (×2): qty 1

## 2019-12-08 NOTE — H&P (Signed)
History and Physical    Clifford [name] [id number] DOB: 31-Oct-1951 DOA: 12/08/2019  PCP: [name], FNP   Patient coming from: Home.  I have personally briefly reviewed patient's old medical records in [place]  Chief Complaint: Weakness and decreased appetite x2 weeks.  HPI: Clifford [name] is a 69 y.o. male with medical history significant of alcoholism, normocytic anemia, hyperlipidemia, hypertension, prostate cancer, seizure disorder who is coming to the emergency department with a 2-week history of decreased appetite and weakness.  He denies fever, chills, but complains of fatigue and malaise.  Denies dyspnea, cough, wheezing or hemoptysis.  No chest pain, palpitations, diaphoresis, PND, orthopnea or pitting edema of the lower extremities.  He had self-limited diarrhea last week.  Denies abdominal pain, nausea, vomiting, melena or hematochezia.  No dysuria, frequency or hematuria.  Denies polyuria, polydipsia, polyphagia or blurred vision.  ED Course: Initial vital signs temperature 98.6 F, pulse 103, respirations 16, blood pressure 93/67 mmHg and O2 sat 100% on room air.  The patient received 2000 mL of NS bolus in the ED.  His fecal occult blood was positive.  White count was 7.4, hemoglobin 9.9 g/dL and platelets 229.  Sodium 130, potassium 3.5, chloride 95 and CO2 20 mmol/L.  BUN 36, creatinine 1.91 and glucose 132 mg/dL.  Lipase was 169.  AST was 154 and ALT 64 units/L.  All other hepatic function were within normal limits.  Review of Systems: As per HPI otherwise 10 point review of systems negative.   Past Medical History:  Diagnosis Date  . Alcoholism ([place])   . Anemia   . High cholesterol   . Hypertension   . Prostate cancer ([place])   . Seizures ([place])     Past Surgical History:  Procedure Laterality Date  . CIRCUMCISION    . COLONOSCOPY N/A 09/13/2016   Procedure: COLONOSCOPY;  Surgeon: Rogene Houston, MD;  [facility];  Service:  Endoscopy;  Laterality: N/A;  2:15  . COLONOSCOPY N/A 04/06/2018   Procedure: COLONOSCOPY;  Surgeon: Rogene Houston, MD;  [facility];  Service: Endoscopy;  Laterality: N/A;  . FLEXIBLE SIGMOIDOSCOPY N/A 01/10/2018   Procedure: FLEXIBLE SIGMOIDOSCOPY;  Surgeon: Rogene Houston, MD;  [facility];  Service: Endoscopy;  Laterality: N/A;  1:55  . FLEXIBLE SIGMOIDOSCOPY N/A 06/08/2018   Procedure: FLEXIBLE SIGMOIDOSCOPY;  Surgeon: Danie Binder, MD;  [facility];  Service: Endoscopy;  Laterality: N/A;  . HEMORRHOID SURGERY N/A 04/09/2018   Procedure: HEMORRHOIDECTOMY;  Surgeon: Aviva Signs, MD;  Location: [facility];  Service: General;  Laterality: N/A;  . NO PAST SURGERIES    . POLYPECTOMY  04/06/2018   Procedure: POLYPECTOMY;  Surgeon: Rogene Houston, MD;  [facility];  Service: Endoscopy;;  sigmoid  . PROSTATE BIOPSY    . VIDEO ASSISTED THORACOSCOPY (VATS)/EMPYEMA Left 07/07/2018   Procedure: VIDEO ASSISTED THORACOSCOPY (VATS)/DRAINAGE OF EMPYEMA, DECORTICATION;  Surgeon: [name], MD;  Location: [place];  Service: Thoracic;  Laterality: Left;     reports that he has never smoked. He has never used smokeless tobacco. He reports current alcohol use. He reports that he does not use drugs.  Allergies  Allergen Reactions  . Lisinopril Swelling    Family History  Problem Relation Age of Onset  . Alzheimer's disease Mother   . Hypertension Mother   . Hypertension Father   . Stroke Father   . Prostate cancer Father   .  Hypertension Brother    Prior to Admission medications   Medication Sig Start Date End Date Taking? Authorizing Provider  amLODipine (NORVASC) 10 MG tablet Take 10 mg by mouth daily.   Yes [provider]  Chlorphen-Phenyleph-APAP (CORICIDIN D COLD/FLU/SINUS) 2-5-325 MG TABS Take 1 tablet by mouth daily as needed (for sinus complications).   Yes [provider]  pantoprazole (PROTONIX) 40 MG tablet  Take 40 mg by mouth daily.   Yes [provider]    Physical Exam: Vitals:   12/08/19 1646 12/08/19 1700 12/08/19 1715  BP: 93/67 132/76   Pulse: (!) 103  87  Resp: 16 (!) 25 (!) 25  Temp: 98.6 F (37 C)    TempSrc: Oral    SpO2: 100%  96%    Constitutional: NAD, calm, comfortable Eyes: PERRL, lids and conjunctivae normal ENMT: Mucous membranes are mildly dry.  Posterior pharynx clear of any exudate or lesions. Neck: normal, supple, no masses, no thyromegaly Respiratory: Mild tachypnea in the low to mid 20s, but otherwise clear to auscultation bilaterally, no wheezing, no crackles. Normal respiratory effort. No accessory muscle use.  Cardiovascular: Regular rate and rhythm, no murmurs / rubs / gallops. No extremity edema. 2+ pedal pulses. No carotid bruits.  Abdomen: Nondistended. Bowel sounds positive. Soft, no tenderness, no masses palpated. No hepatosplenomegaly.  Musculoskeletal: no clubbing / cyanosis. Good ROM, no contractures. Normal muscle tone.  Skin: no rashes, lesions, ulcers on limited dermatological examination. Neurologic: CN 2-12 grossly intact. Sensation intact, DTR normal. Strength 5/5 in all 4.  Psychiatric: Normal judgment and insight. Alert and oriented x 3. Normal mood.   Labs on Admission: I have personally reviewed following labs and imaging studies  CBC: Recent Labs  Lab 12/08/19 1734  WBC 7.4  NEUTROABS 4.9  HGB 9.9*  HCT 29.9*  MCV 96.5  PLT Q000111Q   Basic Metabolic Panel: Recent Labs  Lab 12/08/19 1734  NA 130*  K 3.5  CL 95*  CO2 20*  GLUCOSE 132*  BUN 36*  CREATININE 1.91*  CALCIUM 8.9   GFR: CrCl cannot be calculated (Unknown ideal weight.). Liver Function Tests: Recent Labs  Lab 12/08/19 1734  AST 154*  ALT 64*  ALKPHOS 104  BILITOT 1.2  PROT 8.1  ALBUMIN 4.0   Recent Labs  Lab 12/08/19 1734  LIPASE 160*   No results for input(s): AMMONIA in the last 168 hours. Coagulation Profile: No results for input(s):  INR, PROTIME in the last 168 hours. Cardiac Enzymes: No results for input(s): CKTOTAL, CKMB, CKMBINDEX, TROPONINI in the last 168 hours. BNP (last 3 results) No results for input(s): PROBNP in the last 8760 hours. HbA1C: No results for input(s): HGBA1C in the last 72 hours. CBG: No results for input(s): GLUCAP in the last 168 hours. Lipid Profile: No results for input(s): CHOL, HDL, LDLCALC, TRIG, CHOLHDL, LDLDIRECT in the last 72 hours. Thyroid Function Tests: No results for input(s): TSH, T4TOTAL, FREET4, T3FREE, THYROIDAB in the last 72 hours. Anemia Panel: No results for input(s): VITAMINB12, FOLATE, FERRITIN, TIBC, IRON, RETICCTPCT in the last 72 hours. Urine analysis:    Component Value Date/Time   COLORURINE YELLOW 04/18/2019 2209   APPEARANCEUR CLEAR 04/18/2019 2209   LABSPEC 1.010 04/18/2019 2209   PHURINE 5.0 04/18/2019 2209   [place] 04/18/2019 2209   HGBUR NEGATIVE 04/18/2019 2209   BILIRUBINUR NEGATIVE 04/18/2019 2209   KETONESUR 5 (A) 04/18/2019 2209   PROTEINUR NEGATIVE 04/18/2019 2209   NITRITE NEGATIVE 04/18/2019 2209  LEUKOCYTESUR NEGATIVE 04/18/2019 2209    Radiological Exams on Admission: No results found.  EKG: Independently reviewed. Vent. rate 104 BPM PR interval 138 ms QRS duration 68 ms QT/QTc 370/486 ms P-R-T axes 43 -3 35 Sinus tachycardia Minimal voltage criteria for LVH, may be normal variant Borderline ECG  Assessment/Plan Principal Problem:   AKI (acute kidney injury) ([place]) Observation/telemetry. Continue IV fluids. Monitor intake and output. Follow-up renal function electrolytes.  Active Problems:   Alcoholism ([place]) He drinks a pint of gin daily. CIWA protocol. Thiamine, MVI and folate supplementation.    Positive fecal occult blood test Monitor H&H. Protonix 40 mg IVP every 12 hours. Consider GI evaluation.    Abnormal LFTs (liver function tests) Suggested EtOH cessation. Follow-up CMP levels.     Hypomagnesemia Replacing. Follow-up level as needed.    Hypophosphatemia Replacement ordered. Follow-up level as needed.    Hyponatremia Hemodilution? Continue normal saline infusion. Follow-up sodium level.    Hyperlipidemia No treatment due to risk of liver injury.    Essential hypertension Continue amlodipine 10 mg p.o. daily. Monitor blood pressure.    Normocytic anemia Monitor H&H.    DVT prophylaxis: SCDs. Code Status: Full code. Family Communication: Disposition Plan: Observation for IV hydration and further work-up. Consults called: Admission status: Observation/telemetry.   Reubin Milan MD [facility]  If 7PM-7AM, please contact night-coverage [email or web address]  12/08/2019, 8:18 PM   This document was prepared using Dragon voice recognition software and may contain some unintended transcription errors.

## 2019-12-08 NOTE — ED Provider Notes (Signed)
[facility] Provider Note   CSN: [id number] Arrival date & time: 12/08/19  1539     History Chief Complaint  Patient presents with  . Weakness    Clifford [name] is a 69 y.o. male past medical history of alcoholism, anemia, high cholesterol, hypertension, seizures who presents for evaluation of 2 weeks of generalized weakness, decreased appetite.  Patient states that just has not felt like eating.  He has tried to drink and eat a few things but states he does not does not have an appetite.  Denies any pain or nausea/vomiting.  He did report a few days ago, he had some diarrhea that lasted for about a day or so.  He did have a few episodes where he felt like there was some blood in the stools.  He has not had any since.  He does report these been drinking about a pint of gin a day for the last 2 weeks.  No other ingestions and denies any illicit drug use.  Patient denies any fevers, chest pain, difficulty breathing, abdominal pain.  The history is provided by the patient.       Past Medical History:  Diagnosis Date  . Alcoholism ([place])   . Anemia   . High cholesterol   . Hypertension   . Prostate cancer ([place])   . Seizures [facility])     Patient Active Problem List   Diagnosis Date Noted  . Alcoholic ketosis 99991111  . Hyponatremia   . Weakness 04/18/2019  . Metabolic acidosis Q000111Q  . AKI (acute kidney injury) ([place]) 04/18/2019  . Bleeding hemorrhoid   . Thrombocytopenia ([place]) 04/08/2018  . Malignant neoplasm of prostate ([place]) 12/04/2016  . Anemia in other chronic diseases classified elsewhere 07/11/2016  . Alcoholism ([place]) 07/04/2016  . Seizures ([employer]) 07/04/2016  . Hyperlipidemia 07/04/2016  . Essential hypertension 07/04/2016    Past Surgical History:  Procedure Laterality Date  . CIRCUMCISION    . COLONOSCOPY N/A 09/13/2016   Procedure: COLONOSCOPY;  Surgeon: Rogene Houston, MD;  [facility];  Service: Endoscopy;  Laterality:  N/A;  2:15  . COLONOSCOPY N/A 04/06/2018   Procedure: COLONOSCOPY;  Surgeon: Rogene Houston, MD;  [facility];  Service: Endoscopy;  Laterality: N/A;  . FLEXIBLE SIGMOIDOSCOPY N/A 01/10/2018   Procedure: FLEXIBLE SIGMOIDOSCOPY;  Surgeon: Rogene Houston, MD;  [facility];  Service: Endoscopy;  Laterality: N/A;  1:55  . FLEXIBLE SIGMOIDOSCOPY N/A 06/08/2018   Procedure: FLEXIBLE SIGMOIDOSCOPY;  Surgeon: Danie Binder, MD;  [facility];  Service: Endoscopy;  Laterality: N/A;  . HEMORRHOID SURGERY N/A 04/09/2018   Procedure: HEMORRHOIDECTOMY;  Surgeon: Aviva Signs, MD;  Location[facility];  Service: General;  Laterality: N/A;  . NO PAST SURGERIES    . POLYPECTOMY  04/06/2018   Procedure: POLYPECTOMY;  Surgeon: Rogene Houston, MD;  [facility];  Service: Endoscopy;;  sigmoid  . PROSTATE BIOPSY    . VIDEO ASSISTED THORACOSCOPY (VATS)/EMPYEMA Left 07/07/2018   Procedure: VIDEO ASSISTED THORACOSCOPY (VATS)/DRAINAGE OF EMPYEMA, DECORTICATION;  Surgeon: [name], MD;  [facility];  Service: Thoracic;  Laterality: Left;       Family History  Problem Relation Age of Onset  . Alzheimer's disease Mother   . Hypertension Mother   . Hypertension Father   . Stroke Father   . Prostate cancer Father   . Hypertension Brother     Social History   Tobacco Use  .  Smoking status: Never Smoker  . Smokeless tobacco: Never Used  Substance Use Topics  . Alcohol use: Yes    Comment: 1-2 bottles of Gin every few days  . Drug use: No    Home Medications Prior to Admission medications   Medication Sig Start Date End Date Taking? Authorizing Provider  amLODipine (NORVASC) 10 MG tablet Take 10 mg by mouth daily.   Yes [provider]  Chlorphen-Phenyleph-APAP (CORICIDIN D COLD/FLU/SINUS) 2-5-325 MG TABS Take 1 tablet by mouth daily as needed (for sinus complications).   Yes [provider]  pantoprazole (PROTONIX) 40 MG  tablet Take 40 mg by mouth daily.   Yes [provider]    Allergies    Lisinopril  Review of Systems   Review of Systems  Constitutional: Positive for appetite change. Negative for fever.  Respiratory: Negative for cough and shortness of breath.   Cardiovascular: Negative for chest pain.  Gastrointestinal: Positive for blood in stool and diarrhea. Negative for abdominal pain, nausea and vomiting.  Genitourinary: Negative for dysuria and hematuria.  Neurological: Positive for weakness (generalized). Negative for headaches.  All other systems reviewed and are negative.   Physical Exam Updated Vital Signs BP 132/76   Pulse 87   Temp 98.6 F (37 C) (Oral)   Resp (!) 25   SpO2 96%   Physical Exam Vitals and nursing note reviewed. Exam conducted with a chaperone present.  Constitutional:      Appearance: Normal appearance. He is well-developed.     Comments: Sitting comfortably on examination table  HENT:     Head: Normocephalic and atraumatic.  Eyes:     General: Lids are normal.     Conjunctiva/sclera: Conjunctivae normal.     Pupils: Pupils are equal, round, and reactive to light.  Cardiovascular:     Rate and Rhythm: Normal rate and regular rhythm.     Pulses: Normal pulses.     Heart sounds: Normal heart sounds. No murmur. No friction rub. No gallop.   Pulmonary:     Effort: Pulmonary effort is normal.     Breath sounds: Normal breath sounds.     Comments: Lungs clear to auscultation bilaterally.  Symmetric chest rise.  No wheezing, rales, rhonchi. Abdominal:     Palpations: Abdomen is soft. Abdomen is not rigid.     Tenderness: There is no abdominal tenderness. There is no guarding.     Comments: Abdomen is soft, non-distended, non-tender. No rigidity, No guarding. No peritoneal signs.  Genitourinary:    Rectum: Guaiac result positive.     Comments: The exam was performed with a chaperone present. Digital Rectal Exam reveals sphincter with good tone. No  external hemorrhoids. No masses or fissures. Stool color is brown with no overt blood. Musculoskeletal:        General: Normal range of motion.     Cervical back: Full passive range of motion without pain.  Skin:    General: Skin is warm and dry.     Capillary Refill: Capillary refill takes less than 2 seconds.  Neurological:     Mental Status: He is alert and oriented to person, place, and time.     Comments: Follows commands, Moves all extremities  5/5 strength to BUE and BLE  Sensation intact throughout all major nerve distributions  Psychiatric:        Speech: Speech normal.     ED Results / Procedures / Treatments   Labs (all labs ordered are listed, but only abnormal  results are displayed) Labs Reviewed  COMPREHENSIVE METABOLIC PANEL - Abnormal; Notable for the following components:      Result Value   Sodium 130 (*)    Chloride 95 (*)    CO2 20 (*)    Glucose, Bld 132 (*)    BUN 36 (*)    Creatinine, Ser 1.91 (*)    AST 154 (*)    ALT 64 (*)    GFR calc non Af Amer 35 (*)    GFR calc Af Amer 41 (*)    All other components within normal limits  CBC WITH DIFFERENTIAL/PLATELET - Abnormal; Notable for the following components:   RBC 3.10 (*)    Hemoglobin 9.9 (*)    HCT 29.9 (*)    Monocytes Absolute 1.5 (*)    Abs Immature Granulocytes 0.14 (*)    All other components within normal limits  LIPASE, BLOOD - Abnormal; Notable for the following components:   Lipase 160 (*)    All other components within normal limits  POC OCCULT BLOOD, ED - Abnormal; Notable for the following components:   Fecal Occult Bld POSITIVE (*)    All other components within normal limits  SARS CORONAVIRUS 2 (TAT 6-24 HRS)  URINALYSIS, ROUTINE W REFLEX MICROSCOPIC    EKG EKG Interpretation  Date/Time:  Monday December 08 2019 16:45:58 EST Ventricular Rate:  104 PR Interval:  138 QRS Duration: 68 QT Interval:  370 QTC Calculation: 486 R Axis:   -3 Text Interpretation: Sinus  tachycardia Minimal voltage criteria for LVH, may be normal variant Borderline ECG Confirmed by Fredia Sorrow [phone]) on 12/08/2019 5:05:56 PM   Radiology No results found.  Procedures Procedures (including critical care time)  Medications Ordered in ED Medications  sodium chloride 0.9 % bolus 500 mL (0 mLs Intravenous Stopped 12/08/19 1930)  thiamine (B-1) injection 100 mg (100 mg Intravenous Given 12/08/19 1930)  sodium chloride 0.9 % bolus 500 mL (500 mLs Intravenous New Bag/Given 12/08/19 1930)    ED Course  I have reviewed the triage vital signs and the nursing notes.  Pertinent labs & imaging results that were available during my care of the patient were reviewed by me and considered in my medical decision making (see chart for details).    MDM Rules/Calculators/A&P                      69 year old male who presents for evaluation of generalized weakness and decreased appetite x2 weeks.  Does endorse drinking a pint of gin a day for the last 2 weeks.  Denies any pain.  Did have some diarrhea few days ago noticed some blood in the stools.  None since.  Initially arrival, he is afebrile, nontoxic-appearing.  Vitals stable.  Benign abdominal exam.  Normal strength of bilateral upper and lower extremities.  Consider infectious process versus electrolyte imbalance.  Also consider GI bleed.  Plan to check labs.  Fecal occult is positive, though he did not have gross melena on exam.  Lipase is unremarkable. CMP shows sodium 130, potassium of 3.5, bicarb of 20, BUN of 36, creatinine of 1.91.  His creatinine from June was 1.  It looks like his baseline is anywhere between 1-1.2.  CBC shows no leukocytosis.  Hemoglobin is 9.9.  It looks like he has been continuously trending down over the last several months.  His hemoglobin in June was 10.  Lipase is slightly up at 160.  Patient is not having any pain or  nausea/vomiting.  Given elevation in lipase, AKI and drop in hemoglobin, feel that it is  reasonable to monitor him in the hospital to trend his hemoglobin and rehydrate him.  Will plan for admission.  Discussed with hospitalist.  They are agreeable.  Portions of this note were generated with [employer]. Dictation errors may occur despite best attempts at proofreading.  Final Clinical Impression(s) / ED Diagnoses Final diagnoses:  AKI (acute kidney injury) ([place])  Rectal bleeding  Anemia, unspecified type    Rx / DC Orders ED Discharge Orders    None       Desma Mcgregor 12/08/19 1936    Fredia Sorrow, MD 12/09/19 1529

## 2019-12-08 NOTE — ED Triage Notes (Signed)
C/O WEAKNESS AND LOSS OF APPETITE FOR 2 WEEKS

## 2019-12-08 NOTE — ED Provider Notes (Signed)
Medical screening examination/treatment/procedure(s) were conducted as a shared visit with non-physician practitioner(s) and myself.  I personally evaluated the patient during the encounter.  EKG Interpretation  Date/Time:  Monday December 08 2019 16:45:58 EST Ventricular Rate:  104 PR Interval:  138 QRS Duration: 68 QT Interval:  370 QTC Calculation: 486 R Axis:   -3 Text Interpretation: Sinus tachycardia Minimal voltage criteria for LVH, may be normal variant Borderline ECG Confirmed by Fredia Sorrow [phone]) on 12/08/2019 5:05:56 PM   Results for orders placed or performed during the hospital encounter of 12/08/19  Comprehensive metabolic panel  Result Value Ref Range   Sodium 130 (L) 135 - 145 mmol/L   Potassium 3.5 3.5 - 5.1 mmol/L   Chloride 95 (L) 98 - 111 mmol/L   CO2 20 (L) 22 - 32 mmol/L   Glucose, Bld 132 (H) 70 - 99 mg/dL   BUN 36 (H) 8 - 23 mg/dL   Creatinine, Ser 1.91 (H) 0.61 - 1.24 mg/dL   Calcium 8.9 8.9 - 10.3 mg/dL   Total Protein 8.1 6.5 - 8.1 g/dL   Albumin 4.0 3.5 - 5.0 g/dL   AST 154 (H) 15 - 41 U/L   ALT 64 (H) 0 - 44 U/L   Alkaline Phosphatase 104 38 - 126 U/L   Total Bilirubin 1.2 0.3 - 1.2 mg/dL   GFR calc non Af Amer 35 (L) >60 mL/min   GFR calc Af Amer 41 (L) >60 mL/min   Anion gap 15 5 - 15  CBC with Differential  Result Value Ref Range   WBC 7.4 4.0 - 10.5 K/uL   RBC 3.10 (L) 4.22 - 5.81 MIL/uL   Hemoglobin 9.9 (L) 13.0 - 17.0 g/dL   HCT 29.9 (L) 39.0 - 52.0 %   MCV 96.5 80.0 - 100.0 fL   MCH 31.9 26.0 - 34.0 pg   MCHC 33.1 30.0 - 36.0 g/dL   RDW 13.4 11.5 - 15.5 %   Platelets 229 150 - 400 K/uL   nRBC 0.0 0.0 - 0.2 %   Neutrophils Relative % 64 %   Neutro Abs 4.9 1.7 - 7.7 K/uL   Lymphocytes Relative 11 %   Lymphs Abs 0.8 0.7 - 4.0 K/uL   Monocytes Relative 20 %   Monocytes Absolute 1.5 (H) 0.1 - 1.0 K/uL   Eosinophils Relative 2 %   Eosinophils Absolute 0.2 0.0 - 0.5 K/uL   Basophils Relative 1 %   Basophils Absolute 0.0 0.0 -  0.1 K/uL   Immature Granulocytes 2 %   Abs Immature Granulocytes 0.14 (H) 0.00 - 0.07 K/uL  Lipase, blood  Result Value Ref Range   Lipase 160 (H) 11 - 51 U/L  POC occult blood, ED Provider will collect  Result Value Ref Range   Fecal Occult Bld POSITIVE (A) NEGATIVE   No results found.   Patient brought in by his wife.  Patient without any specific complaints.  Bases wife was concerned as he is had generalized weakness loss of appetite for 2 weeks and has had a couple falls.  Patient admits to drinking a pint of gin a day.  But says he did not have a full pain today.  Talked about having some   has bowel movements few days ago.  Today on rectal he is heme positive but not grossly bloody.  Patient's hemoglobin is in the 9 range.  Lipase also elevated at 160.  Due to his renal function which is consistent with an acute kidney  injury.  Creatinine 1.91 and BUN at 36.  Feel patient needs admission for observation make sure the hemoglobin does not drop further.  And also to hydrate him to see if the BUN and creatinine improves.  Also patient could warrant CT scan of the abdomen.  Hospitalist is planning to do this after he has been hydrated and perhaps his kidney function will be better.  Is very possible that all this is secondary to just drinking alcohol and nothing else.  Hospitalist will admit.                                Fredia Sorrow, MD 12/08/19 [phone]

## 2019-12-09 ENCOUNTER — Encounter (HOSPITAL_COMMUNITY): Payer: Self-pay | Admitting: Internal Medicine

## 2019-12-09 DIAGNOSIS — E785 Hyperlipidemia, unspecified: Secondary | ICD-10-CM

## 2019-12-09 DIAGNOSIS — I10 Essential (primary) hypertension: Secondary | ICD-10-CM

## 2019-12-09 DIAGNOSIS — E871 Hypo-osmolality and hyponatremia: Secondary | ICD-10-CM

## 2019-12-09 DIAGNOSIS — N179 Acute kidney failure, unspecified: Secondary | ICD-10-CM

## 2019-12-09 DIAGNOSIS — R945 Abnormal results of liver function studies: Secondary | ICD-10-CM

## 2019-12-09 DIAGNOSIS — F1020 Alcohol dependence, uncomplicated: Secondary | ICD-10-CM | POA: Diagnosis not present

## 2019-12-09 DIAGNOSIS — D649 Anemia, unspecified: Secondary | ICD-10-CM

## 2019-12-09 DIAGNOSIS — R195 Other fecal abnormalities: Secondary | ICD-10-CM

## 2019-12-09 LAB — CBC
HCT: 30.3 % — ABNORMAL LOW (ref 39.0–52.0)
Hemoglobin: 9.7 g/dL — ABNORMAL LOW (ref 13.0–17.0)
MCH: 31.9 pg (ref 26.0–34.0)
MCHC: 32 g/dL (ref 30.0–36.0)
MCV: 99.7 fL (ref 80.0–100.0)
Platelets: 200 10*3/uL (ref 150–400)
RBC: 3.04 MIL/uL — ABNORMAL LOW (ref 4.22–5.81)
RDW: 13.2 % (ref 11.5–15.5)
WBC: 5.9 10*3/uL (ref 4.0–10.5)
nRBC: 0 % (ref 0.0–0.2)

## 2019-12-09 LAB — HIV ANTIBODY (ROUTINE TESTING W REFLEX): HIV Screen 4th Generation wRfx: NONREACTIVE

## 2019-12-09 LAB — COMPREHENSIVE METABOLIC PANEL
ALT: 51 U/L — ABNORMAL HIGH (ref 0–44)
AST: 100 U/L — ABNORMAL HIGH (ref 15–41)
Albumin: 3.7 g/dL (ref 3.5–5.0)
Alkaline Phosphatase: 92 U/L (ref 38–126)
Anion gap: 13 (ref 5–15)
BUN: 34 mg/dL — ABNORMAL HIGH (ref 8–23)
CO2: 19 mmol/L — ABNORMAL LOW (ref 22–32)
Calcium: 8.4 mg/dL — ABNORMAL LOW (ref 8.9–10.3)
Chloride: 105 mmol/L (ref 98–111)
Creatinine, Ser: 1.63 mg/dL — ABNORMAL HIGH (ref 0.61–1.24)
GFR calc Af Amer: 49 mL/min — ABNORMAL LOW (ref >60)
GFR calc non Af Amer: 42 mL/min — ABNORMAL LOW (ref >60)
Glucose, Bld: 109 mg/dL — ABNORMAL HIGH (ref 70–99)
Potassium: 3.2 mmol/L — ABNORMAL LOW (ref 3.5–5.1)
Sodium: 137 mmol/L (ref 135–145)
Total Bilirubin: 0.8 mg/dL (ref 0.3–1.2)
Total Protein: 7 g/dL (ref 6.5–8.1)

## 2019-12-09 LAB — URINALYSIS, ROUTINE W REFLEX MICROSCOPIC
Bilirubin Urine: NEGATIVE
Glucose, UA: NEGATIVE mg/dL
Ketones, ur: 5 mg/dL — AB
Leukocytes,Ua: NEGATIVE
Nitrite: NEGATIVE
Protein, ur: NEGATIVE mg/dL
Specific Gravity, Urine: 1.008 (ref 1.005–1.030)
pH: 5 (ref 5.0–8.0)

## 2019-12-09 LAB — SARS CORONAVIRUS 2 (TAT 6-24 HRS): SARS Coronavirus 2: NEGATIVE

## 2019-12-09 MED ORDER — POTASSIUM CHLORIDE IN NACL 40-0.9 MEQ/L-% IV SOLN
INTRAVENOUS | Status: DC
  Administered 2019-12-09: 100 mL/h via INTRAVENOUS

## 2019-12-09 MED ORDER — FOLIC ACID 1 MG PO TABS: 1.0000 mg | ORAL_TABLET | Freq: Every day | ORAL | Status: DC

## 2019-12-09 MED ORDER — POTASSIUM CHLORIDE ER 10 MEQ PO TBCR: 10.0000 meq | EXTENDED_RELEASE_TABLET | Freq: Two times a day (BID) | ORAL | 0 refills | Status: DC

## 2019-12-09 MED ORDER — POTASSIUM PHOSPHATES 15 MMOLE/5ML IV SOLN
30.0000 mmol | Freq: Once | INTRAVENOUS | Status: AC
  Administered 2019-12-09: 30 mmol via INTRAVENOUS
  Filled 2019-12-09: qty 10

## 2019-12-09 MED ORDER — MAGNESIUM SULFATE 4 GM/100ML IV SOLN
4.0000 g | Freq: Once | INTRAVENOUS | Status: AC
  Administered 2019-12-09: 4 g via INTRAVENOUS
  Filled 2019-12-09: qty 100

## 2019-12-09 MED ORDER — THIAMINE HCL 100 MG PO TABS: 100.0000 mg | ORAL_TABLET | Freq: Every day | ORAL | Status: DC

## 2019-12-09 MED ORDER — PANTOPRAZOLE SODIUM 40 MG PO TBEC: 40.0000 mg | DELAYED_RELEASE_TABLET | Freq: Every day | ORAL | Status: DC

## 2019-12-09 MED ORDER — ADULT MULTIVITAMIN W/MINERALS CH: 1.0000 | ORAL_TABLET | Freq: Every day | ORAL | Status: DC

## 2019-12-09 NOTE — Discharge Instructions (Signed)
Acute Kidney Injury, Adult ° °Acute kidney injury is a sudden worsening of kidney function. The kidneys are organs that have several jobs. They filter the blood to remove waste products and extra fluid. They also maintain a healthy balance of minerals and hormones in the body, which helps control blood pressure and keep bones strong. With this condition, your kidneys do not do their jobs as well as they should. °This condition ranges from mild to severe. Over time it may develop into long-lasting (chronic) kidney disease. Early detection and treatment may prevent acute kidney injury from developing into a chronic condition. °What are the causes? °Common causes of this condition include: °· A problem with blood flow to the kidneys. This may be caused by: °? Low blood pressure (hypotension) or shock. °? Blood loss. °? Heart and blood vessel (cardiovascular) disease. °? Severe burns. °? Liver disease. °· Direct damage to the kidneys. This may be caused by: °? Certain medicines. °? A kidney infection. °? Poisoning. °? Being around or in contact with toxic substances. °? A surgical wound. °? A hard, direct hit to the kidney area. °· A sudden blockage of urine flow. This may be caused by: °? Cancer. °? Kidney stones. °? An enlarged prostate in males. °What are the signs or symptoms? °Symptoms of this condition may not be obvious until the condition becomes severe. Symptoms of this condition can include: °· Tiredness (lethargy), or difficulty staying awake. °· Nausea or vomiting. °· Swelling (edema) of the face, legs, ankles, or feet. °· Problems with urination, such as: °? Abdominal pain, or pain along the side of your stomach (flank). °? Decreased urine production. °? Decrease in the force of urine flow. °· Muscle twitches and cramps, especially in the legs. °· Confusion or trouble concentrating. °· Loss of appetite. °· Fever. °How is this diagnosed? °This condition may be diagnosed with tests, including: °· Blood  tests. °· Urine tests. °· Imaging tests. °· A test in which a sample of tissue is removed from the kidneys to be examined under a microscope (kidney biopsy). °How is this treated? °Treatment for this condition depends on the cause and how severe the condition is. In mild cases, treatment may not be needed. The kidneys may heal on their own. In more severe cases, treatment will involve: °· Treating the cause of the kidney injury. This may involve changing any medicines you are taking or adjusting your dosage. °· Fluids. You may need specialized IV fluids to balance your body's needs. °· Having a catheter placed to drain urine and prevent blockages. °· Preventing problems from occurring. This may mean avoiding certain medicines or procedures that can cause further injury to the kidneys. °In some cases treatment may also require: °· A procedure to remove toxic wastes from the body (dialysis or continuous renal replacement therapy - CRRT). °· Surgery. This may be done to repair a torn kidney, or to remove the blockage from the urinary system. °Follow these instructions at home: °Medicines °· Take over-the-counter and prescription medicines only as told by your health care provider. °· Do not take any new medicines without your health care provider's approval. Many medicines can worsen your kidney damage. °· Do not take any vitamin and mineral supplements without your health care provider's approval. Many nutritional supplements can worsen your kidney damage. °Lifestyle °· If your health care provider prescribed changes to your diet, follow them. You may need to decrease the amount of protein you eat. °· Achieve and maintain a healthy   weight. If you need help with this, ask your health care provider.  Start or continue an exercise plan. Try to exercise at least 30 minutes a day, 5 days a week.  Do not use any tobacco products, such as cigarettes, chewing tobacco, and e-cigarettes. If you need help quitting, ask your  health care provider. General instructions  Keep track of your blood pressure. Report changes in your blood pressure as told by your health care provider.  Stay up to date with immunizations. Ask your health care provider which immunizations you need.  Keep all follow-up visits as told by your health care provider. This is important. Where to find more information  American Association of Kidney Patients: BombTimer.gl  National Kidney Foundation: www.kidney.[place]: https://mathis.com/  [facility]: ? www.lifeoptions.org ? www.kidneyschool.org Contact a health care provider if:  Your symptoms get worse.  You develop new symptoms. Get help right away if:  You develop symptoms of worsening kidney disease, which include: ? Headaches. ? Abnormally dark or light skin. ? Easy bruising. ? Frequent hiccups. ? Chest pain. ? Shortness of breath. ? End of menstruation in women. ? Seizures. ? Confusion or altered mental status. ? Abdominal or back pain. ? Itchiness.  You have a fever.  Your body is producing less urine.  You have pain or bleeding when you urinate. Summary  Acute kidney injury is a sudden worsening of kidney function.  Acute kidney injury can be caused by problems with blood flow to the kidneys, direct damage to the kidneys, and sudden blockage of urine flow.  Symptoms of this condition may not be obvious until it becomes severe. Symptoms may include edema, lethargy, confusion, nausea or vomiting, and problems passing urine.  This condition can usually be diagnosed with blood tests, urine tests, and imaging tests. Sometimes a kidney biopsy is done to diagnose this condition.  Treatment for this condition often involves treating the underlying cause. It is treated with fluids, medicines, dialysis, diet changes, or surgery. This information is not intended to replace advice given to you by your health care provider. Make  sure you discuss any questions you have with your health care provider. Document Revised: 10/12/2017 Document Reviewed: 10/20/2016 Elsevier Patient Education  [place].     Rectal Bleeding  Rectal bleeding is when blood comes out of the opening of the butt (anus). People with this kind of bleeding may notice bright red blood in their underwear or in the toilet after they poop (have a bowel movement). They may also have dark red or black poop (stool). Rectal bleeding is often a sign that something is wrong. It needs to be checked by a doctor. Follow these instructions at home: Watch for any changes in your condition. Take these actions to help with bleeding and discomfort:  Eat a diet that is high in fiber. This will keep your poop soft so it is easier for you to poop without pushing too hard. Ask your doctor to tell you what foods and drinks are high in fiber.  Drink enough fluid to keep your pee (urine) clear or pale yellow. This also helps keep your poop soft.  Try taking a warm bath. This may help with pain.  Keep all follow-up visits as told by your doctor. This is important. Get help right away if:  You have new bleeding.  You have more bleeding than before.  You have black or dark red poop.  You throw up (vomit) blood  or something that looks like coffee grounds.  You have pain or tenderness in your belly (abdomen).  You have a fever.  You feel weak.  You feel sick to your stomach (nauseous).  You pass out (faint).  You have very bad pain in your butt.  You cannot poop. This information is not intended to replace advice given to you by your health care provider. Make sure you discuss any questions you have with your health care provider. Document Revised: 10/12/2017 Document Reviewed: 12/26/2015 Elsevier Patient Education  2020 [place].   IMPORTANT INFORMATION: PAY CLOSE ATTENTION   PHYSICIAN DISCHARGE INSTRUCTIONS  Follow with Primary care  provider  [name], FNP  and other consultants as instructed by your Hospitalist Physician  [place] IF SYMPTOMS COME BACK, WORSEN OR NEW PROBLEM DEVELOPS   Please note: You were cared for by a hospitalist during your hospital stay. Every effort will be made to forward records to your primary care provider.  You can request that your primary care provider send for your hospital records if they have not received them.  Once you are discharged, your primary care physician will handle any further medical issues. Please note that NO REFILLS for any discharge medications will be authorized once you are discharged, as it is imperative that you return to your primary care physician (or establish a relationship with a primary care physician if you do not have one) for your post hospital discharge needs so that they can reassess your need for medications and monitor your lab values.  Please get a complete blood count and chemistry panel checked by your Primary MD at your next visit, and again as instructed by your Primary MD.  Get Medicines reviewed and adjusted: Please take all your medications with you for your next visit with your Primary MD  Laboratory/radiological data: Please request your Primary MD to go over all hospital tests and procedure/radiological results at the follow up, please ask your primary care provider to get all Hospital records sent to his/her office.  In some cases, they will be blood work, cultures and biopsy results pending at the time of your discharge. Please request that your primary care provider follow up on these results.  If you are diabetic, please bring your blood sugar readings with you to your follow up appointment with primary care.    Please call and make your follow up appointments as soon as possible.    Also Note the following: If you experience worsening of your admission symptoms, develop shortness of breath,  life threatening emergency, suicidal or homicidal thoughts you must seek medical attention immediately by calling 911 or calling your MD immediately  if symptoms less severe.  You must read complete instructions/literature along with all the possible adverse reactions/side effects for all the Medicines you take and that have been prescribed to you. Take any new Medicines after you have completely understood and accpet all the possible adverse reactions/side effects.   Do not drive when taking Pain medications or sleeping medications (Benzodiazepines)  Do not take more than prescribed Pain, Sleep and Anxiety Medications. It is not advisable to combine anxiety,sleep and pain medications without talking with your primary care practitioner  Special Instructions: If you have smoked or chewed Tobacco  in the last 2 yrs please stop smoking, stop any regular Alcohol  and or any Recreational drug use.  Wear Seat belts while driving.  Do not drive if taking any narcotic,  mind altering or controlled substances or recreational drugs or alcohol.

## 2019-12-09 NOTE — Evaluation (Addendum)
Physical Therapy Evaluation Patient Details Name: Clifford [name] MRN: [id number] DOB: Feb 23, 1951 Today's Date: 12/09/2019   History of Present Illness  Clifford [name] is a 69 y.o. male with medical history significant of alcoholism, normocytic anemia, hyperlipidemia, hypertension, prostate cancer, seizure disorder who is coming to the emergency department with a 2-week history of decreased appetite and weakness.  He denies fever, chills, but complains of fatigue and malaise.  Denies dyspnea, cough, wheezing or hemoptysis.  No chest pain, palpitations, diaphoresis, PND, orthopnea or pitting edema of the lower extremities.  He had self-limited diarrhea last week.  Denies abdominal pain, nausea, vomiting, melena or hematochezia.  No dysuria, frequency or hematuria.  Denies polyuria, polydipsia, polyphagia or blurred vision.    Clinical Impression  Patient functioning near baseline for functional mobility and gait, demonstrates good return for transferring to chair and commode in bathroom, no loss of balance during ambulation in hallways on level, inclined and declined surfaces, had to use side rail when going down incline.  Patient sat in chair for a couple of minutes, but requested to go back to bed after therapy.  Plan:  Patient discharged from physical therapy to care of nursing for ambulation daily as tolerated for length of stay.     Follow Up Recommendations No PT follow up    Equipment Recommendations  None recommended by PT    Recommendations for Other Services       Precautions / Restrictions Precautions Precautions: None Restrictions Weight Bearing Restrictions: No      Mobility  Bed Mobility Overal bed mobility: Modified Independent             General bed mobility comments: increased time  Transfers Overall transfer level: Modified independent Equipment used: None             General transfer comment: increased time  Ambulation/Gait Ambulation/Gait  assistance: Modified independent (Device/Increase time) Gait Distance (Feet): 200 Feet Assistive device: None Gait Pattern/deviations: WFL(Within Functional Limits) Gait velocity: decreased   General Gait Details: grossly WFL, has to use side rail when going down ramp, demonstrates good return for ambulation on level, inclined and declined surfaces without loss of balance  Stairs Stairs: Yes Stairs assistance: Modified independent (Device/Increase time) Stair Management: Step to pattern;One rail Right;One rail Left Number of Stairs: 4 General stair comments: demonstrates good return for going up/down 4 steps using 1 siderail without loss of balance  Wheelchair Mobility    Modified Rankin (Stroke Patients Only)       Balance Overall balance assessment: No apparent balance deficits (not formally assessed)                                           Pertinent Vitals/Pain Pain Assessment: No/denies pain    Home Living Family/patient expects to be discharged to:: Private residence Living Arrangements: Spouse/significant other Available Help at Discharge: Family;Available 24 hours/day Type of Home: House Home Access: Stairs to enter Entrance Stairs-Rails: Right;Left;Can reach both Entrance Stairs-Number of Steps: 4 Home Layout: Able to live on main level with bedroom/bathroom;Laundry or work area in basement;Two level Home Equipment: Walker - 2 wheels;Cane - single point;Bedside commode;Wheelchair - manual;Shower seat      Prior Function Level of Independence: Independent with assistive device(s)         Comments: Community ambulator without AD     Hand Dominance  Extremity/Trunk Assessment   Upper Extremity Assessment Upper Extremity Assessment: Overall WFL for tasks assessed    Lower Extremity Assessment Lower Extremity Assessment: Overall WFL for tasks assessed    Cervical / Trunk Assessment Cervical / Trunk Assessment: Normal   Communication   Communication: No difficulties  Cognition Arousal/Alertness: Awake/alert Behavior During Therapy: WFL for tasks assessed/performed Overall Cognitive Status: Within Functional Limits for tasks assessed                                        General Comments      Exercises     Assessment/Plan    PT Assessment Patent does not need any further PT services  PT Problem List         PT Treatment Interventions      PT Goals (Current goals can be found in the Care Plan section)  Acute Rehab PT Goals Patient Stated Goal: return home with family to assist PT Goal Formulation: With patient Time For Goal Achievement: 12/09/19 Potential to Achieve Goals: Good    Frequency     Barriers to discharge        Co-evaluation               AM-PAC PT "6 Clicks" Mobility  Outcome Measure Help needed turning from your back to your side while in a flat bed without using bedrails?: None Help needed moving from lying on your back to sitting on the side of a flat bed without using bedrails?: None Help needed moving to and from a bed to a chair (including a wheelchair)?: None Help needed standing up from a chair using your arms (e.g., wheelchair or bedside chair)?: None Help needed to walk in hospital room?: None Help needed climbing 3-5 steps with a railing? : None 6 Click Score: 24    End of Session   Activity Tolerance: Patient tolerated treatment well;Patient limited by fatigue Patient left: in bed;with call bell/phone within reach Nurse Communication: Mobility status PT Visit Diagnosis: Unsteadiness on feet (R26.81);Other abnormalities of gait and mobility (R26.89);Muscle weakness (generalized) (M62.81)    Time: [id number] PT Time Calculation (min) (ACUTE ONLY): 26 min   Charges:   PT Evaluation $PT Eval Moderate Complexity: 1 Mod PT Treatments $Therapeutic Activity: 23-37 mins        1:06 PM, 12/09/19 Lonell Grandchild, MPT Physical  Therapist with [facility] 336 [phone] office [phone] mobile phone

## 2019-12-09 NOTE — Discharge Summary (Signed)
Physician Discharge Summary  Clifford [name] [id number] DOB: Mar 16, 1951 DOA: 12/08/2019  PCP: [name], FNP  Admit date: 12/08/2019 Discharge date: 12/09/2019  Admitted From:  Home  Disposition:  Home   Recommendations for Outpatient Follow-up:  1. Follow up with PCP in 1 weeks 2. Follow up with GI in 2 weeks 3. Please check BMP/CBC/Mg/Phos in 1-2 weeks  Discharge Condition: STABLE   CODE STATUS: FULL    Brief Hospitalization Summary: Please see all hospital notes, images, labs for full details of the hospitalization. HPI: Clifford [name] is a 69 y.o. male with medical history significant of alcoholism, normocytic anemia, hyperlipidemia, hypertension, prostate cancer, seizure disorder who is coming to the emergency department with a 2-week history of decreased appetite and weakness.  He denies fever, chills, but complains of fatigue and malaise.  Denies dyspnea, cough, wheezing or hemoptysis.  No chest pain, palpitations, diaphoresis, PND, orthopnea or pitting edema of the lower extremities.  He had self-limited diarrhea last week.  Denies abdominal pain, nausea, vomiting, melena or hematochezia.  No dysuria, frequency or hematuria.  Denies polyuria, polydipsia, polyphagia or blurred vision.  ED Course: Initial vital signs temperature 98.6 F, pulse 103, respirations 16, blood pressure 93/67 mmHg and O2 sat 100% on room air.  The patient received 2000 mL of NS bolus in the ED.  His fecal occult blood was positive.  White count was 7.4, hemoglobin 9.9 g/dL and platelets 229.  Sodium 130, potassium 3.5, chloride 95 and CO2 20 mmol/L.  BUN 36, creatinine 1.91 and glucose 132 mg/dL.  Lipase was 169.  AST was 154 and ALT 64 units/L.  All other hepatic function were within normal limits.  The patient was admitted and started on IV fluids for his acute kidney injury.  He was also given electrolyte replacement treatments.  He was given additional magnesium and potassium.  He was  placed on a CIWA protocol.  He was given supplemental vitamins and minerals.  I saw him on the morning of 12/09/2019 and he reported that he was feeling much better.  I recommended that he stay another night so that we can recheck his electrolytes and renal function however the patient says that he feels well and he does not want to stay another night and he wants to go home.  I worry that he is going to resume his heavy alcohol consumption.  Also was noted to have a positive fecal occult blood test.  His hemoglobin is stable when rechecked today 9.7.  He has had extensive GI work-up in the past with Dr. Oneida Alar and Dr. Laural Golden.   The patient says he prefers to follow-up with GI outpatient and does not want to be seen in hospital because he wants to leave today.  He says he called his wife to pick him up.  I am recommending that he see his PCP in about 1 week and have a repeat labs done at that time.  I also requested that he follow-up with his GI doctors and 2 weeks for recheck.  The patient verbalized understanding.  He is being discharged home in stable condition.  If he continues to abuse alcohol he remains at high risk for adverse outcomes.  I counseled him and he verbalizes understanding.  He is not interested in seeking alcohol treatment at this time.  Discharge Diagnoses:  Principal Problem:   AKI (acute kidney injury) ([place]) Active Problems:   Alcoholism ([place])   Hyperlipidemia   Essential hypertension   Hyponatremia  Normocytic anemia   Positive fecal occult blood test   Abnormal LFTs (liver function tests)   Hypomagnesemia   Discharge Instructions:  Allergies as of 12/09/2019      Reactions   Lisinopril Swelling      Medication List    STOP taking these medications   Coricidin D Cold/Flu/Sinus 2-5-325 MG Tabs Generic drug: Chlorphen-Phenyleph-APAP     TAKE these medications   amLODipine 10 MG tablet Commonly known as: NORVASC Take 10 mg by mouth daily.   folic acid 1 MG  tablet Commonly known as: FOLVITE Take 1 tablet (1 mg total) by mouth daily. Start taking on: December 10, 2019   multivitamin with minerals Tabs tablet Take 1 tablet by mouth daily. Start taking on: December 10, 2019   pantoprazole 40 MG tablet Commonly known as: PROTONIX Take 40 mg by mouth daily.   potassium chloride 10 MEQ tablet Commonly known as: KLOR-CON Take 1 tablet (10 mEq total) by mouth 2 (two) times daily for 3 days.   thiamine 100 MG tablet Take 1 tablet (100 mg total) by mouth daily. Start taking on: December 10, 2019      Follow-up Information    [name], FNP. Schedule an appointment as soon as possible for a visit in 1 week(s).   Specialty: Family Medicine Why: Hospital Follow Up  Contact information: [address] [place] [address] [phone]        Rogene Houston, MD. Schedule an appointment as soon as possible for a visit in 2 week(s).   Specialty: Gastroenterology Why: Hospital Follow Up  Contact information: [address], [address] [place] [address] [phone]          Allergies  Allergen Reactions  . Lisinopril Swelling   Allergies as of 12/09/2019      Reactions   Lisinopril Swelling      Medication List    STOP taking these medications   Coricidin D Cold/Flu/Sinus 2-5-325 MG Tabs Generic drug: Chlorphen-Phenyleph-APAP     TAKE these medications   amLODipine 10 MG tablet Commonly known as: NORVASC Take 10 mg by mouth daily.   folic acid 1 MG tablet Commonly known as: FOLVITE Take 1 tablet (1 mg total) by mouth daily. Start taking on: December 10, 2019   multivitamin with minerals Tabs tablet Take 1 tablet by mouth daily. Start taking on: December 10, 2019   pantoprazole 40 MG tablet Commonly known as: PROTONIX Take 40 mg by mouth daily.   potassium chloride 10 MEQ tablet Commonly known as: KLOR-CON Take 1 tablet (10 mEq total) by mouth 2 (two) times daily for 3 days.   thiamine 100 MG  tablet Take 1 tablet (100 mg total) by mouth daily. Start taking on: December 10, 2019       Procedures/Studies: No results found.   Subjective: Pt says he feels better and he wants to go home, he is not willing to stay another night to be further monitored.  He says he will follow up with his PCP and GI outpatient.   Discharge Exam: Vitals:   12/08/19 2144 12/09/19 0551  BP: 133/71 133/73  Pulse: 94 75  Resp: 17 18  Temp: 98.6 F (37 C) 98 F (36.7 C)  SpO2: 100% 100%   Vitals:   12/08/19 2030 12/08/19 2100 12/08/19 2144 12/09/19 0551  BP: (!) 142/72 (!) 156/81 133/71 133/73  Pulse: 80 85 94 75  Resp: 14 18 17 18   Temp:  98.6 F (37 C) 98 F (36.7 C)  TempSrc:   Oral Oral  SpO2: 100% 100% 100% 100%  Weight:   63.4 kg   Height:   5\' 6"  (1.676 m)    General: Pt is alert, awake, not in acute distress Cardiovascular: RRR, S1/S2 +, no rubs, no gallops Respiratory: CTA bilaterally, no wheezing, no rhonchi Abdominal: Soft, NT, ND, bowel sounds + Extremities: no edema, no cyanosis   The results of significant diagnostics from this hospitalization (including imaging, microbiology, ancillary and laboratory) are listed below for reference.     Microbiology: No results found for this or any previous visit (from the past 240 hour(s)).   Labs: BNP (last 3 results) No results for input(s): BNP in the last 8760 hours. Basic Metabolic Panel: Recent Labs  Lab 12/08/19 1734 12/08/19 2305 12/09/19 0459  NA 130* 134* 137  K 3.5 3.0* 3.2*  CL 95* 101 105  CO2 20* 20* 19*  GLUCOSE 132* 126* 109*  BUN 36* 34* 34*  CREATININE 1.91* 1.83* 1.63*  CALCIUM 8.9 8.4* 8.4*  MG  --  1.0*  --   PHOS  --  1.3*  --    Liver Function Tests: Recent Labs  Lab 12/08/19 1734 12/09/19 0459  AST 154* 100*  ALT 64* 51*  ALKPHOS 104 92  BILITOT 1.2 0.8  PROT 8.1 7.0  ALBUMIN 4.0 3.7   Recent Labs  Lab 12/08/19 1734  LIPASE 160*   No results for input(s): AMMONIA in the last  168 hours. CBC: Recent Labs  Lab 12/08/19 1734 12/08/19 2305 12/09/19 0459  WBC 7.4  --  5.9  NEUTROABS 4.9  --   --   HGB 9.9* 9.9* 9.7*  HCT 29.9* 30.2* 30.3*  MCV 96.5  --  99.7  PLT 229  --  200   Cardiac Enzymes: No results for input(s): CKTOTAL, CKMB, CKMBINDEX, TROPONINI in the last 168 hours. BNP: Invalid input(s): POCBNP CBG: No results for input(s): GLUCAP in the last 168 hours. D-Dimer No results for input(s): DDIMER in the last 72 hours. Hgb A1c No results for input(s): HGBA1C in the last 72 hours. Lipid Profile No results for input(s): CHOL, HDL, LDLCALC, TRIG, CHOLHDL, LDLDIRECT in the last 72 hours. Thyroid function studies No results for input(s): TSH, T4TOTAL, T3FREE, THYROIDAB in the last 72 hours.  Invalid input(s): FREET3 Anemia work up No results for input(s): VITAMINB12, FOLATE, FERRITIN, TIBC, IRON, RETICCTPCT in the last 72 hours. Urinalysis    Component Value Date/Time   COLORURINE YELLOW 12/08/2019 2330   APPEARANCEUR CLEAR 12/08/2019 2330   LABSPEC 1.008 12/08/2019 2330   PHURINE 5.0 12/08/2019 2330   GLUCOSEU NEGATIVE 12/08/2019 2330   HGBUR SMALL (A) 12/08/2019 2330   BILIRUBINUR NEGATIVE 12/08/2019 2330   KETONESUR 5 (A) 12/08/2019 2330   PROTEINUR NEGATIVE 12/08/2019 2330   NITRITE NEGATIVE 12/08/2019 2330   LEUKOCYTESUR NEGATIVE 12/08/2019 2330   Sepsis Labs Invalid input(s): PROCALCITONIN,  WBC,  LACTICIDVEN Microbiology No results found for this or any previous visit (from the past 240 hour(s)).  Time coordinating discharge:   SIGNED:  Irwin Brakeman, MD  [facility] 12/09/2019, 12:36 PM How to contact the [facility] Attending or Consulting provider [place] or covering provider during after hours [place], for this patient?  1. Check the care team in [facility] and look for a) attending/consulting [facility] provider listed and b) the [facility] team listed 2. Log into [email or web address] and use [place]'s universal password to access. If  you do  not have the password, please contact the hospital operator. 3. Locate the [facility] provider you are looking for under [facility] and page to a number that you can be directly reached. 4. If you still have difficulty reaching the provider, please page the [facility] (Director on Call) for the Hospitalists listed on amion for assistance.

## 2019-12-09 NOTE — TOC Initial Note (Signed)
Transition of Care [facility]) - Initial/Assessment Note    Patient Details  Name: Clifford [name] MRN: [id number] Date of Birth: 1951/01/18  Transition of Care [facility]) CM/SW Contact:    Boneta Lucks, RN Phone Number: 12/09/2019, 1:22 PM  Clinical Narrative:      Patient admitted in observation for acute kidney injury. CM consulted for SA resources.  Called the room talking to patient out AA meeting and offered resources, he refused. He is asking if we called his wife, he is ready to go home. Discharge orders placed.             Expected Discharge Plan: [place] Barriers to Discharge: Barriers Resolved   Patient Goals and CMS Choice Patient states their goals for this hospitalization and ongoing recovery are:: to go home.      Expected Discharge Plan and Services Expected Discharge Plan: [place]    Expected Discharge Date: 12/09/19                 Prior Living Arrangements/Services   Lives with:: Spouse          Need for Family Participation in Patient Care: Yes (Comment) Care giver support system in place?: Yes (comment)   Criminal Activity/Legal Involvement Pertinent to Current Situation/Hospitalization: No - Comment as needed  Activities of Daily Living Home Assistive Devices/Equipment: None ADL Screening (condition at time of admission) Patient's cognitive ability adequate to safely complete daily activities?: Yes Is the patient deaf or have difficulty hearing?: No Does the patient have difficulty seeing, even when wearing glasses/contacts?: No Does the patient have difficulty concentrating, remembering, or making decisions?: No Patient able to express need for assistance with ADLs?: Yes Does the patient have difficulty dressing or bathing?: Yes Independently performs ADLs?: Yes (appropriate for developmental age) Does the patient have difficulty walking or climbing stairs?: No Weakness of Legs: None Weakness of Arms/Hands:  None  Permission Sought/Granted Permission sought to share information with : Case Manager    Share Information with NAME: [name]     Permission granted to share info w Relationship: wife     Emotional Assessment       Orientation: : Oriented to Self, Oriented to  Time, Oriented to Situation, Oriented to Place Alcohol / Substance Use: Alcohol Use    Admission diagnosis:  Rectal bleeding [K62.5] AKI (acute kidney injury) ([place]) [N17.9] Anemia, unspecified type [D64.9] Patient Active Problem List   Diagnosis Date Noted  . Hypomagnesemia 12/09/2019  . Normocytic anemia 12/08/2019  . Positive fecal occult blood test 12/08/2019  . Abnormal LFTs (liver function tests) 12/08/2019  . Alcoholic ketosis 99991111  . Hyponatremia   . Weakness 04/18/2019  . Metabolic acidosis Q000111Q  . AKI (acute kidney injury) ([place]) 04/18/2019  . Bleeding hemorrhoid   . Thrombocytopenia ([place]) 04/08/2018  . Malignant neoplasm of prostate ([place]) 12/04/2016  . Anemia in other chronic diseases classified elsewhere 07/11/2016  . Alcoholism ([place]) 07/04/2016  . Seizures ([place]) 07/04/2016  . Hyperlipidemia 07/04/2016  . Essential hypertension 07/04/2016   PCP:  [name], FNP Pharmacy:   [facility] [address] [place] - [address] [place], [place] [address] Phone: [phone] Fax: [phone]  d Readmission Risk Interventions Readmission Risk Prevention Plan 04/20/2019  Transportation Screening Complete  PCP or Specialist Appt within 5-7 Days Complete  Home Care Screening Complete  Medication Review (RN CM) Complete  Some recent data might be hidden

## 2020-02-07 ENCOUNTER — Other Ambulatory Visit: Payer: Self-pay

## 2020-02-07 ENCOUNTER — Encounter (HOSPITAL_COMMUNITY): Payer: Self-pay | Admitting: Emergency Medicine

## 2020-02-07 ENCOUNTER — Emergency Department (HOSPITAL_COMMUNITY): Payer: Medicare Other

## 2020-02-07 ENCOUNTER — Emergency Department (HOSPITAL_COMMUNITY)
Admission: EM | Admit: 2020-02-07 | Discharge: 2020-02-07 | Disposition: A | Discharge status: Home / Self Care | Payer: Medicare Other | Attending: Emergency Medicine | Admitting: Emergency Medicine

## 2020-02-07 DIAGNOSIS — Y999 Unspecified external cause status: Secondary | ICD-10-CM | POA: Insufficient documentation

## 2020-02-07 DIAGNOSIS — W01198A Fall on same level from slipping, tripping and stumbling with subsequent striking against other object, initial encounter: Secondary | ICD-10-CM | POA: Diagnosis not present

## 2020-02-07 DIAGNOSIS — S299XXA Unspecified injury of thorax, initial encounter: Secondary | ICD-10-CM | POA: Diagnosis present

## 2020-02-07 DIAGNOSIS — I10 Essential (primary) hypertension: Secondary | ICD-10-CM | POA: Diagnosis not present

## 2020-02-07 DIAGNOSIS — Z79899 Other long term (current) drug therapy: Secondary | ICD-10-CM | POA: Insufficient documentation

## 2020-02-07 DIAGNOSIS — Y92002 Bathroom of unspecified non-institutional (private) residence single-family (private) house as the place of occurrence of the external cause: Secondary | ICD-10-CM | POA: Insufficient documentation

## 2020-02-07 DIAGNOSIS — Y9389 Activity, other specified: Secondary | ICD-10-CM | POA: Insufficient documentation

## 2020-02-07 DIAGNOSIS — S2241XA Multiple fractures of ribs, right side, initial encounter for closed fracture: Secondary | ICD-10-CM | POA: Insufficient documentation

## 2020-02-07 MED ORDER — HYDROCODONE-ACETAMINOPHEN 5-325 MG PO TABS
1.0000 | ORAL_TABLET | Freq: Once | ORAL | Status: AC
  Administered 2020-02-07: 1 via ORAL
  Filled 2020-02-07: qty 1

## 2020-02-07 MED ORDER — HYDROCODONE-ACETAMINOPHEN 5-325 MG PO TABS: ORAL_TABLET | ORAL | 0 refills | Status: DC

## 2020-02-07 NOTE — ED Provider Notes (Signed)
[facility] EMERGENCY DEPARTMENT Provider Note   CSN: [id number] Arrival date & time: 02/07/20  1533     History Chief Complaint  Patient presents with  . Fall    Clifford [name] is a 69 y.o. male.  HPI      Clifford [name] is a 69 y.o. male who presents to the Emergency Department complaining of right lateral rib pain secondary to a fall that occurred last evening while going to the restroom.  He states that he tripped and fell in the bathroom landing on his right side.  He complains of a focal area of pain to the lateral right chest wall that worsens with deep breath and certain movements.  Pain improves at rest.  He denies any gradually worsening pain or abrupt changes to the level of his pain.  He denies shortness of breath, LOC, head injury, dizziness, headache, visual changes, low back or neck pain, abdominal pain, hemoptysis or vomiting.    Past Medical History:  Diagnosis Date  . Alcoholism ([place])   . Anemia   . High cholesterol   . Hypertension   . Prostate cancer ([place])   . Seizures [facility])     Patient Active Problem List   Diagnosis Date Noted  . Hypomagnesemia 12/09/2019  . Normocytic anemia 12/08/2019  . Positive fecal occult blood test 12/08/2019  . Abnormal LFTs (liver function tests) 12/08/2019  . Alcoholic ketosis 99991111  . Hyponatremia   . Weakness 04/18/2019  . Metabolic acidosis Q000111Q  . AKI (acute kidney injury) ([place]) 04/18/2019  . Bleeding hemorrhoid   . Thrombocytopenia ([place]) 04/08/2018  . Malignant neoplasm of prostate ([place]) 12/04/2016  . Anemia in other chronic diseases classified elsewhere 07/11/2016  . Alcoholism ([place]) 07/04/2016  . Seizures ([place]) 07/04/2016  . Hyperlipidemia 07/04/2016  . Essential hypertension 07/04/2016    Past Surgical History:  Procedure Laterality Date  . CIRCUMCISION    . COLONOSCOPY N/A 09/13/2016   Procedure: COLONOSCOPY;  Surgeon: Rogene Houston, MD;  Location: [facility] ENDO SU[facility];  Service: Endoscopy;   Laterality: N/A;  2:15  . COLONOSCOPY N/A 04/06/2018   Procedure: COLONOSCOPY;  Surgeon: Rogene Houston, MD;  Location: [facility] ENDO SUITE;  Service: Endoscopy;  Laterality: N/A;  . FLEXIBLE SIGMOIDOSCOPY N/A 01/10/2018   Procedure: FLEXIBLE SIGMOIDOSCOPY;  Surgeon: Rogene Houston, MD;  Location: [facility] ENDO SUITE;  Service: Endoscopy;  Laterality: N/A;  1:55  . FLEXIBLE SIGMOIDOSCOPY N/A 06/08/2018   Procedure: FLEXIBLE SIGMOIDOSCOPY;  Surgeon: Danie Binder, MD;  Location: [facility] ENDO SU[facility];  Service: Endoscopy;  Laterality: N/A;  . HEMORRHOID SURGERY N/A 04/09/2018   Procedure: HEMORRHOIDECTOMY;  Surgeon: Aviva Signs, MD;  Location: [facility] ORS;  Service: General;  Laterality: N/A;  . NO PAST SURGERIES    . POLYPECTOMY  04/06/2018   Procedure: POLYPECTOMY;  Surgeon: Rogene Houston, MD;  Location: [facility] ENDO SU[facility];  Service: Endoscopy;;  sigmoid  . PROSTATE BIOPSY    . VIDEO ASSISTED THORACOSCOPY (VATS)/EMPYEMA Left 07/07/2018   Procedure: VIDEO ASSISTED THORACOSCOPY (VATS)/DRAINAGE OF EMPYEMA, DECORTICATION;  Surgeon: [name], MD;  Location: [facility] OR;  Service: Thoracic;  Laterality: Left;       Family History  Problem Relation Age of Onset  . Alzheimer's disease Mother   . Hypertension Mother   . Hypertension Father   . Stroke Father   . Prostate cancer Father   . Hypertension Brother     Social History   Tobacco Use  . Smoking status: Never Smoker  .  Smokeless tobacco: Never Used  Substance Use Topics  . Alcohol use: Yes    Comment: 1-2 bottles of Gin every few days  . Drug use: No    Home Medications Prior to Admission medications   Medication Sig Start Date End Date Taking? Authorizing Provider  amLODipine (NORVASC) 10 MG tablet Take 10 mg by mouth daily.    [provider]  folic acid (FOLVITE) 1 MG tablet Take 1 tablet (1 mg total) by mouth daily. 12/10/19   Murlean Iba, MD  HYDROcodone-acetaminophen (NORCO/VICODIN) 5-325 MG tablet Take one tab po q 4  hrs prn pain 02/07/20   Lorita Forinash, PA-C  Multiple Vitamin (MULTIVITAMIN WITH MINERALS) TABS tablet Take 1 tablet by mouth daily. 12/10/19   Johnson, Clanford L, MD  pantoprazole (PROTONIX) 40 MG tablet Take 40 mg by mouth daily.    [provider]  potassium chloride (KLOR-CON) 10 MEQ tablet Take 1 tablet (10 mEq total) by mouth 2 (two) times daily for 3 days. 12/09/19 12/12/19  Johnson, Clanford L, MD  thiamine 100 MG tablet Take 1 tablet (100 mg total) by mouth daily. 12/10/19   Murlean Iba, MD    Allergies    Lisinopril  Review of Systems   Review of Systems  Constitutional: Negative for activity change and appetite change.  Eyes: Negative for photophobia and visual disturbance.  Respiratory: Negative for cough, chest tightness and shortness of breath.   Cardiovascular: Positive for chest pain (focal pain to right ribs).  Gastrointestinal: Negative for abdominal pain, constipation, diarrhea, nausea and vomiting.  Genitourinary: Negative for difficulty urinating, dysuria, flank pain and hematuria.  Musculoskeletal: Negative for arthralgias, back pain, gait problem, joint swelling, neck pain and neck stiffness.  Skin: Negative for rash and wound.  Neurological: Negative for dizziness, seizures, syncope, facial asymmetry, speech difficulty, weakness and light-headedness.  Psychiatric/Behavioral: Negative for decreased concentration.    Physical Exam Updated Vital Signs BP (!) 145/69 (BP Location: Left Arm)   Pulse 83   Temp 99 F (37.2 C) (Oral)   Resp 17   Ht 5\' 6"  (1.676 m)   Wt 65.8 kg   SpO2 100%   BMI 23.40 kg/m   Physical Exam Vitals and nursing note reviewed.  Constitutional:      General: He is not in acute distress.    Appearance: Normal appearance. He is not ill-appearing.  HENT:     Head: Atraumatic.     Mouth/Throat:     Mouth: Mucous membranes are moist.  Eyes:     Extraocular Movements: Extraocular movements intact.      Conjunctiva/sclera: Conjunctivae normal.     Pupils: Pupils are equal, round, and reactive to light.  Cardiovascular:     Rate and Rhythm: Normal rate and regular rhythm.     Pulses: Normal pulses.  Pulmonary:     Effort: Pulmonary effort is normal. No respiratory distress.     Breath sounds: Normal breath sounds. No wheezing or rhonchi.  Chest:     Chest wall: Tenderness (focal ttp of the anterolateral right ribs.  no guarding or crepitus. ) present.  Abdominal:     General: There is no distension.     Palpations: Abdomen is soft.     Tenderness: There is no abdominal tenderness. There is no right CVA tenderness, left CVA tenderness or guarding.  Musculoskeletal:        General: No tenderness, deformity or signs of injury. Normal range of motion.     Cervical back:  Normal range of motion. No tenderness.     Right lower leg: No edema.     Left lower leg: No edema.  Lymphadenopathy:     Cervical: No cervical adenopathy.  Skin:    General: Skin is warm.     Capillary Refill: Capillary refill takes less than 2 seconds.  Neurological:     General: No focal deficit present.     Mental Status: He is alert.     Sensory: No sensory deficit.     Motor: No weakness.     ED Results / Procedures / Treatments   Labs (all labs ordered are listed, but only abnormal results are displayed) Labs Reviewed - No data to display  EKG None  Radiology DG Ribs Unilateral W/Chest Right  Result Date: 02/07/2020 CLINICAL DATA:  Right rib pain secondary to a fall last night. EXAM: RIGHT RIBS AND CHEST - 3+ VIEW COMPARISON:  08/13/2018 FINDINGS: There are fractures of the anterolateral aspects of the right ninth and tenth ribs. The other ribs are intact. Heart size and vascularity are normal. Lungs are clear. No pneumothorax or pleural effusion or lung contusion. No other bone abnormality. IMPRESSION: Fractures of the anterolateral aspects of the right ninth and tenth ribs. Electronically Signed   By:  Lorriane Shire M.D.   On: 02/07/2020 16:35    Procedures Procedures (including critical care time)  Medications Ordered in ED Medications  HYDROcodone-acetaminophen (NORCO/VICODIN) 5-325 MG per tablet 1 tablet (1 tablet Oral Given 02/07/20 1609)    ED Course  I have reviewed the triage vital signs and the nursing notes.  Pertinent labs & imaging results that were available during my care of the patient were reviewed by me and considered in my medical decision making (see chart for details).    MDM Rules/Calculators/A&P                      Well appearing male with focal rib pain secondary to a fall that occurred last evening.  reproducible rib pain with movement, palpation and deep breath. No focal neuro deficits.  No hx of head injury or LOC, no evidence of head trauma.  Abdomen is soft and non-tender.  Vitals reviewed.    Discussed XR findings with him and the importance of close out pateint f/u.  Incentive spirometer dispensed with instructions for use and strict return precautions given.    Final Clinical Impression(s) / ED Diagnoses Final diagnoses:  Closed fracture of multiple ribs of right side, initial encounter    Rx / DC Orders ED Discharge Orders         Ordered    HYDROcodone-acetaminophen (NORCO/VICODIN) 5-325 MG tablet     02/07/20 1659           Kem Parkinson, PA-C 02/07/20 1738    Virgel Manifold, MD 02/08/20 1506

## 2020-02-07 NOTE — Discharge Instructions (Addendum)
Your Xray today shows you have 2 broken ribs.  Use the spirometer as directed 3-4 times a day to help keep your lungs clear.  Use a small pillow held to your side to take a deep breath and cough.  Follow-up with your doctor for recheck in one week.  Return here for any worsening symptoms.

## 2020-02-07 NOTE — ED Triage Notes (Signed)
Patient c/o left rib and flank pain after tripping and falling last night. Denies hitting head or LOC. Per patient pain worse with deep breath.

## 2020-03-02 ENCOUNTER — Encounter (HOSPITAL_COMMUNITY): Payer: Self-pay | Admitting: *Deleted

## 2020-03-02 ENCOUNTER — Inpatient Hospital Stay (HOSPITAL_COMMUNITY)
Admission: EM | Admit: 2020-03-02 | Discharge: 2020-03-05 | DRG: 378 | Disposition: A | Discharge status: Home Health | Payer: Medicare Other | Attending: Internal Medicine | Admitting: Internal Medicine

## 2020-03-02 ENCOUNTER — Other Ambulatory Visit: Payer: Self-pay

## 2020-03-02 DIAGNOSIS — K228 Other specified diseases of esophagus: Secondary | ICD-10-CM | POA: Diagnosis not present

## 2020-03-02 DIAGNOSIS — Z20822 Contact with and (suspected) exposure to covid-19: Secondary | ICD-10-CM | POA: Diagnosis present

## 2020-03-02 DIAGNOSIS — R7989 Other specified abnormal findings of blood chemistry: Secondary | ICD-10-CM | POA: Diagnosis present

## 2020-03-02 DIAGNOSIS — Z8042 Family history of malignant neoplasm of prostate: Secondary | ICD-10-CM

## 2020-03-02 DIAGNOSIS — K2920 Alcoholic gastritis without bleeding: Secondary | ICD-10-CM | POA: Diagnosis not present

## 2020-03-02 DIAGNOSIS — Z8546 Personal history of malignant neoplasm of prostate: Secondary | ICD-10-CM

## 2020-03-02 DIAGNOSIS — D649 Anemia, unspecified: Secondary | ICD-10-CM | POA: Diagnosis not present

## 2020-03-02 DIAGNOSIS — E872 Acidosis, unspecified: Secondary | ICD-10-CM | POA: Diagnosis present

## 2020-03-02 DIAGNOSIS — R945 Abnormal results of liver function studies: Secondary | ICD-10-CM | POA: Diagnosis present

## 2020-03-02 DIAGNOSIS — Z79899 Other long term (current) drug therapy: Secondary | ICD-10-CM | POA: Diagnosis not present

## 2020-03-02 DIAGNOSIS — R000 Tachycardia, unspecified: Secondary | ICD-10-CM | POA: Diagnosis present

## 2020-03-02 DIAGNOSIS — K259 Gastric ulcer, unspecified as acute or chronic, without hemorrhage or perforation: Secondary | ICD-10-CM | POA: Diagnosis not present

## 2020-03-02 DIAGNOSIS — K760 Fatty (change of) liver, not elsewhere classified: Secondary | ICD-10-CM | POA: Diagnosis present

## 2020-03-02 DIAGNOSIS — E8889 Other specified metabolic disorders: Secondary | ICD-10-CM | POA: Diagnosis present

## 2020-03-02 DIAGNOSIS — F1020 Alcohol dependence, uncomplicated: Secondary | ICD-10-CM | POA: Diagnosis present

## 2020-03-02 DIAGNOSIS — Y906 Blood alcohol level of 120-199 mg/100 ml: Secondary | ICD-10-CM | POA: Diagnosis present

## 2020-03-02 DIAGNOSIS — K922 Gastrointestinal hemorrhage, unspecified: Secondary | ICD-10-CM | POA: Diagnosis not present

## 2020-03-02 DIAGNOSIS — Z8249 Family history of ischemic heart disease and other diseases of the circulatory system: Secondary | ICD-10-CM | POA: Diagnosis not present

## 2020-03-02 DIAGNOSIS — Z888 Allergy status to other drugs, medicaments and biological substances status: Secondary | ICD-10-CM | POA: Diagnosis not present

## 2020-03-02 DIAGNOSIS — I10 Essential (primary) hypertension: Secondary | ICD-10-CM | POA: Diagnosis present

## 2020-03-02 DIAGNOSIS — R569 Unspecified convulsions: Secondary | ICD-10-CM | POA: Diagnosis present

## 2020-03-02 DIAGNOSIS — K2921 Alcoholic gastritis with bleeding: Secondary | ICD-10-CM | POA: Diagnosis present

## 2020-03-02 DIAGNOSIS — K625 Hemorrhage of anus and rectum: Secondary | ICD-10-CM | POA: Diagnosis present

## 2020-03-02 DIAGNOSIS — D62 Acute posthemorrhagic anemia: Secondary | ICD-10-CM | POA: Diagnosis present

## 2020-03-02 DIAGNOSIS — D696 Thrombocytopenia, unspecified: Secondary | ICD-10-CM | POA: Diagnosis present

## 2020-03-02 DIAGNOSIS — E7800 Pure hypercholesterolemia, unspecified: Secondary | ICD-10-CM | POA: Diagnosis present

## 2020-03-02 DIAGNOSIS — K7010 Alcoholic hepatitis without ascites: Secondary | ICD-10-CM | POA: Diagnosis present

## 2020-03-02 DIAGNOSIS — F1010 Alcohol abuse, uncomplicated: Secondary | ICD-10-CM | POA: Diagnosis not present

## 2020-03-02 DIAGNOSIS — K449 Diaphragmatic hernia without obstruction or gangrene: Secondary | ICD-10-CM | POA: Diagnosis present

## 2020-03-02 DIAGNOSIS — K257 Chronic gastric ulcer without hemorrhage or perforation: Secondary | ICD-10-CM | POA: Diagnosis present

## 2020-03-02 DIAGNOSIS — K852 Alcohol induced acute pancreatitis without necrosis or infection: Secondary | ICD-10-CM

## 2020-03-02 DIAGNOSIS — E876 Hypokalemia: Secondary | ICD-10-CM | POA: Diagnosis present

## 2020-03-02 DIAGNOSIS — R509 Fever, unspecified: Secondary | ICD-10-CM

## 2020-03-02 DIAGNOSIS — K921 Melena: Secondary | ICD-10-CM | POA: Diagnosis not present

## 2020-03-02 LAB — CBC WITH DIFFERENTIAL/PLATELET
Abs Immature Granulocytes: 0.07 10*3/uL (ref 0.00–0.07)
Basophils Absolute: 0 10*3/uL (ref 0.0–0.1)
Basophils Relative: 0 %
Eosinophils Absolute: 0.3 10*3/uL (ref 0.0–0.5)
Eosinophils Relative: 4 %
HCT: 25.6 % — ABNORMAL LOW (ref 39.0–52.0)
Hemoglobin: 7.8 g/dL — ABNORMAL LOW (ref 13.0–17.0)
Immature Granulocytes: 1 %
Lymphocytes Relative: 12 %
Lymphs Abs: 1 10*3/uL (ref 0.7–4.0)
MCH: 30.8 pg (ref 26.0–34.0)
MCHC: 30.5 g/dL (ref 30.0–36.0)
MCV: 101.2 fL — ABNORMAL HIGH (ref 80.0–100.0)
Monocytes Absolute: 2.2 10*3/uL — ABNORMAL HIGH (ref 0.1–1.0)
Monocytes Relative: 24 %
Neutro Abs: 5.3 10*3/uL (ref 1.7–7.7)
Neutrophils Relative %: 59 %
Platelets: 144 10*3/uL — ABNORMAL LOW (ref 150–400)
RBC: 2.53 MIL/uL — ABNORMAL LOW (ref 4.22–5.81)
RDW: 13.1 % (ref 11.5–15.5)
WBC: 8.9 10*3/uL (ref 4.0–10.5)
nRBC: 0 % (ref 0.0–0.2)

## 2020-03-02 LAB — COMPREHENSIVE METABOLIC PANEL
ALT: 40 U/L (ref 0–44)
AST: 104 U/L — ABNORMAL HIGH (ref 15–41)
Albumin: 3.9 g/dL (ref 3.5–5.0)
Alkaline Phosphatase: 106 U/L (ref 38–126)
Anion gap: >20 — ABNORMAL HIGH (ref 5–15)
BUN: 14 mg/dL (ref 8–23)
CO2: 15 mmol/L — ABNORMAL LOW (ref 22–32)
Calcium: 8.2 mg/dL — ABNORMAL LOW (ref 8.9–10.3)
Chloride: 94 mmol/L — ABNORMAL LOW (ref 98–111)
Creatinine, Ser: 1.76 mg/dL — ABNORMAL HIGH (ref 0.61–1.24)
GFR calc Af Amer: 45 mL/min — ABNORMAL LOW (ref >60)
GFR calc non Af Amer: 39 mL/min — ABNORMAL LOW (ref >60)
Glucose, Bld: 98 mg/dL (ref 70–99)
Potassium: 3.4 mmol/L — ABNORMAL LOW (ref 3.5–5.1)
Sodium: 133 mmol/L — ABNORMAL LOW (ref 135–145)
Total Bilirubin: 1.2 mg/dL (ref 0.3–1.2)
Total Protein: 7.5 g/dL (ref 6.5–8.1)

## 2020-03-02 LAB — HEMOGLOBIN AND HEMATOCRIT, BLOOD
HCT: 25.6 % — ABNORMAL LOW (ref 39.0–52.0)
Hemoglobin: 7.8 g/dL — ABNORMAL LOW (ref 13.0–17.0)

## 2020-03-02 LAB — MAGNESIUM: Magnesium: 1.1 mg/dL — ABNORMAL LOW (ref 1.7–2.4)

## 2020-03-02 LAB — LIPASE, BLOOD: Lipase: 184 U/L — ABNORMAL HIGH (ref 11–51)

## 2020-03-02 LAB — POC OCCULT BLOOD, ED: Fecal Occult Bld: POSITIVE — AB

## 2020-03-02 LAB — PROTIME-INR
INR: 1.3 — ABNORMAL HIGH (ref 0.8–1.2)
Prothrombin Time: 15.7 seconds — ABNORMAL HIGH (ref 11.4–15.2)

## 2020-03-02 LAB — PHOSPHORUS: Phosphorus: 3 mg/dL (ref 2.5–4.6)

## 2020-03-02 LAB — ETHANOL: Alcohol, Ethyl (B): 195 mg/dL — ABNORMAL HIGH (ref <10)

## 2020-03-02 LAB — PREPARE RBC (CROSSMATCH)

## 2020-03-02 MED ORDER — KCL IN DEXTROSE-NACL 20-5-0.9 MEQ/L-%-% IV SOLN: INTRAVENOUS | Status: DC

## 2020-03-02 MED ORDER — PANTOPRAZOLE SODIUM 40 MG IV SOLR
40.0000 mg | Freq: Once | INTRAVENOUS | Status: AC
  Administered 2020-03-02: 17:00:00 40 mg via INTRAVENOUS
  Filled 2020-03-02: qty 40

## 2020-03-02 MED ORDER — ACETAMINOPHEN 325 MG PO TABS
650.0000 mg | ORAL_TABLET | Freq: Four times a day (QID) | ORAL | Status: DC | PRN
  Administered 2020-03-03 – 2020-03-04 (×2): 650 mg via ORAL
  Filled 2020-03-02 (×2): qty 2

## 2020-03-02 MED ORDER — PANTOPRAZOLE SODIUM 40 MG IV SOLR
INTRAVENOUS | Status: AC
  Filled 2020-03-02: qty 80

## 2020-03-02 MED ORDER — THIAMINE HCL 100 MG/ML IJ SOLN
Freq: Once | INTRAVENOUS | Status: AC
  Filled 2020-03-02: qty 1000

## 2020-03-02 MED ORDER — THIAMINE HCL 100 MG/ML IJ SOLN: 100.0000 mg | Freq: Every day | INTRAMUSCULAR | Status: DC

## 2020-03-02 MED ORDER — KCL IN DEXTROSE-NACL 20-5-0.45 MEQ/L-%-% IV SOLN
INTRAVENOUS | Status: AC
  Filled 2020-03-02: qty 1000

## 2020-03-02 MED ORDER — PANTOPRAZOLE SODIUM 40 MG IV SOLR
40.0000 mg | Freq: Once | INTRAVENOUS | Status: AC
  Administered 2020-03-02: 22:00:00 40 mg via INTRAVENOUS
  Filled 2020-03-02: qty 40

## 2020-03-02 MED ORDER — THIAMINE HCL 100 MG/ML IJ SOLN
INTRAMUSCULAR | Status: AC
  Filled 2020-03-02: qty 2

## 2020-03-02 MED ORDER — THIAMINE HCL 100 MG/ML IJ SOLN
Freq: Once | INTRAVENOUS | Status: DC
  Filled 2020-03-02: qty 1000

## 2020-03-02 MED ORDER — FOLIC ACID 1 MG PO TABS
1.0000 mg | ORAL_TABLET | Freq: Every day | ORAL | Status: DC
  Filled 2020-03-02: qty 1

## 2020-03-02 MED ORDER — ACETAMINOPHEN 650 MG RE SUPP: 650.0000 mg | Freq: Four times a day (QID) | RECTAL | Status: DC | PRN

## 2020-03-02 MED ORDER — M.V.I. ADULT IV INJ
INJECTION | INTRAVENOUS | Status: AC
  Filled 2020-03-02: qty 10

## 2020-03-02 MED ORDER — ADULT MULTIVITAMIN W/MINERALS CH
1.0000 | ORAL_TABLET | Freq: Every day | ORAL | Status: DC
  Administered 2020-03-03 – 2020-03-05 (×3): 1 via ORAL
  Filled 2020-03-02 (×3): qty 1

## 2020-03-02 MED ORDER — SODIUM CHLORIDE 0.9 % IV SOLN
8.0000 mg/h | INTRAVENOUS | Status: DC
  Administered 2020-03-03 – 2020-03-04 (×4): 8 mg/h via INTRAVENOUS
  Filled 2020-03-02 (×8): qty 80

## 2020-03-02 MED ORDER — LORAZEPAM 2 MG/ML IJ SOLN: 1.0000 mg | INTRAMUSCULAR | Status: DC | PRN

## 2020-03-02 MED ORDER — LORAZEPAM 2 MG/ML IJ SOLN: 0.0000 mg | Freq: Two times a day (BID) | INTRAMUSCULAR | Status: DC

## 2020-03-02 MED ORDER — ADULT MULTIVITAMIN W/MINERALS CH
1.0000 | ORAL_TABLET | Freq: Every day | ORAL | Status: DC
  Filled 2020-03-02: qty 1

## 2020-03-02 MED ORDER — THIAMINE HCL 100 MG/ML IJ SOLN
100.0000 mg | Freq: Every day | INTRAMUSCULAR | Status: DC
  Filled 2020-03-02: qty 2

## 2020-03-02 MED ORDER — POLYETHYLENE GLYCOL 3350 17 G PO PACK: 17.0000 g | PACK | Freq: Every day | ORAL | Status: DC | PRN

## 2020-03-02 MED ORDER — FOLIC ACID 1 MG PO TABS
1.0000 mg | ORAL_TABLET | Freq: Every day | ORAL | Status: DC
  Administered 2020-03-03 – 2020-03-05 (×3): 1 mg via ORAL
  Filled 2020-03-02 (×3): qty 1

## 2020-03-02 MED ORDER — LORAZEPAM 1 MG PO TABS: 1.0000 mg | ORAL_TABLET | ORAL | Status: DC | PRN

## 2020-03-02 MED ORDER — THIAMINE HCL 100 MG PO TABS
100.0000 mg | ORAL_TABLET | Freq: Every day | ORAL | Status: DC
  Administered 2020-03-03 – 2020-03-05 (×3): 100 mg via ORAL
  Filled 2020-03-02 (×4): qty 1

## 2020-03-02 MED ORDER — ONDANSETRON HCL 4 MG/2ML IJ SOLN: 4.0000 mg | Freq: Four times a day (QID) | INTRAMUSCULAR | Status: DC | PRN

## 2020-03-02 MED ORDER — ONDANSETRON HCL 4 MG PO TABS: 4.0000 mg | ORAL_TABLET | Freq: Four times a day (QID) | ORAL | Status: DC | PRN

## 2020-03-02 MED ORDER — THIAMINE HCL 100 MG PO TABS
100.0000 mg | ORAL_TABLET | Freq: Every day | ORAL | Status: DC
  Filled 2020-03-02: qty 1

## 2020-03-02 MED ORDER — SODIUM CHLORIDE 0.9 % IV SOLN
10.0000 mL/h | Freq: Once | INTRAVENOUS | Status: AC
  Administered 2020-03-05: 09:00:00 10 mL/h via INTRAVENOUS

## 2020-03-02 MED ORDER — KCL IN DEXTROSE-NACL 20-5-0.9 MEQ/L-%-% IV SOLN: INTRAVENOUS | Status: AC

## 2020-03-02 MED ORDER — LORAZEPAM 2 MG/ML IJ SOLN: 0.0000 mg | Freq: Four times a day (QID) | INTRAMUSCULAR | Status: AC

## 2020-03-02 NOTE — ED Notes (Signed)
Pt became very dizzy and weak while standing during orthostatic vitals.  HR went to 123 and unable to obtain BP.

## 2020-03-02 NOTE — H&P (Addendum)
History and Physical    Clifford [name] [id number] DOB: 03/10/1951 DOA: 03/02/2020  PCP: [name], FNP   Patient coming from: Home  I have personally briefly reviewed patient's old medical records in [place]  Chief Complaint: Dizziness, Black stools  HPI: Clifford [name] is a 69 y.o. male with medical history significant for prostate cancer, alcoholism. Patient presented to the ED with complaints of about 1 week of black and tarry stools, dizziness when standing with generalized weakness.  He denies abdominal pain, no vomiting.  He denies NSAID use.  Patient's drinks alcohol, last alcoholic. Last alcoholic beverage was just prior to arrival in the ED.  ED Course: T-max 99.3, heart rate 94-113, blood pressure systolic [id number].  Unable to obtain orthostatic vitals as patient became tachycardic.  Hemoglobin 7.8.  Blood alcohol level 195.  Stool occult positive.  Potassium 3.4.  Anion gap greater than 20, serum bicarb 15. EDP talked to gastroenterologist on-call, will be seen by Dr. Laural Golden in the morning, transfuse 1 unit PRBC, needs to be greater than 8 for anesthesia.  Anticipate endoscopic evaluation tomorrow.  Review of Systems: As per HPI all other systems reviewed and negative.  Past Medical History:  Diagnosis Date  . Alcoholism ([place])   . Anemia   . High cholesterol   . Hypertension   . Prostate cancer ([place])   . Seizures ([place])     Past Surgical History:  Procedure Laterality Date  . CIRCUMCISION    . COLONOSCOPY N/A 09/13/2016   Procedure: COLONOSCOPY;  Surgeon: Rogene Houston, MD;  [facility];  Service: Endoscopy;  Laterality: N/A;  2:15  . COLONOSCOPY N/A 04/06/2018   Procedure: COLONOSCOPY;  Surgeon: Rogene Houston, MD;  [facility];  Service: Endoscopy;  Laterality: N/A;  . FLEXIBLE SIGMOIDOSCOPY N/A 01/10/2018   Procedure: FLEXIBLE SIGMOIDOSCOPY;  Surgeon: Rogene Houston, MD;  [facility];  Service:  Endoscopy;  Laterality: N/A;  1:55  . FLEXIBLE SIGMOIDOSCOPY N/A 06/08/2018   Procedure: FLEXIBLE SIGMOIDOSCOPY;  Surgeon: Danie Binder, MD;  [facility];  Service: Endoscopy;  Laterality: N/A;  . HEMORRHOID SURGERY N/A 04/09/2018   Procedure: HEMORRHOIDECTOMY;  Surgeon: Aviva Signs, MD;  Location: [facility];  Service: General;  Laterality: N/A;  . NO PAST SURGERIES    . POLYPECTOMY  04/06/2018   Procedure: POLYPECTOMY;  Surgeon: Rogene Houston, MD;  [facility];  Service: Endoscopy;;  sigmoid  . PROSTATE BIOPSY    . VIDEO ASSISTED THORACOSCOPY (VATS)/EMPYEMA Left 07/07/2018   Procedure: VIDEO ASSISTED THORACOSCOPY (VATS)/DRAINAGE OF EMPYEMA, DECORTICATION;  Surgeon: [name], MD;  Location: [facility];  Service: Thoracic;  Laterality: Left;     reports that he has never smoked. He has never used smokeless tobacco. He reports current alcohol use. He reports that he does not use drugs.  Allergies  Allergen Reactions  . Lisinopril Swelling    Family History  Problem Relation Age of Onset  . Alzheimer's disease Mother   . Hypertension Mother   . Hypertension Father   . Stroke Father   . Prostate cancer Father   . Hypertension Brother     Prior to Admission medications   Medication Sig Start Date End Date Taking? Authorizing Provider  folic acid (FOLVITE) 1 MG tablet Take 1 tablet (1 mg total) by mouth daily. Patient not taking: Reported on 03/02/2020 12/10/19   Murlean Iba, MD  HYDROcodone-acetaminophen (NORCO/VICODIN) 5-325 MG tablet  Take one tab po q 4 hrs prn pain Patient not taking: Reported on 03/02/2020 02/07/20   Kem Parkinson, PA-C  Multiple Vitamin (MULTIVITAMIN WITH MINERALS) TABS tablet Take 1 tablet by mouth daily. Patient not taking: Reported on 03/02/2020 12/10/19   Irwin Brakeman L, MD  pantoprazole (PROTONIX) 40 MG tablet Take 40 mg by mouth daily.    [provider]  potassium chloride (KLOR-CON) 10 MEQ tablet Take 1  tablet (10 mEq total) by mouth 2 (two) times daily for 3 days. 12/09/19 12/12/19  Johnson, Clanford L, MD  thiamine 100 MG tablet Take 1 tablet (100 mg total) by mouth daily. Patient not taking: Reported on 03/02/2020 12/10/19   Murlean Iba, MD    Physical Exam: Vitals:   03/02/20 1800 03/02/20 1830 03/02/20 1930 03/02/20 2000  BP: (!) 151/67 (!) 162/82 (!) 164/75 (!) 164/66  Pulse: (!) 101 95 94 98  Resp: 18 (!) 23 (!) 22 16  Temp:      TempSrc:      SpO2: 99% 100% 99% 99%  Weight:      Height:        Constitutional: NAD, calm, comfortable Vitals:   03/02/20 1800 03/02/20 1830 03/02/20 1930 03/02/20 2000  BP: (!) 151/67 (!) 162/82 (!) 164/75 (!) 164/66  Pulse: (!) 101 95 94 98  Resp: 18 (!) 23 (!) 22 16  Temp:      TempSrc:      SpO2: 99% 100% 99% 99%  Weight:      Height:       Eyes: PERRL, lids and conjunctivae normal ENMT: Mucous membranes are moist.  Neck: normal, supple, no masses, no thyromegaly Respiratory: clear to auscultation bilaterally, no wheezing, no crackles. Normal respiratory effort. No accessory muscle use.  Cardiovascular: Regular rate and rhythm, no murmurs / rubs / gallops. No extremity edema. 2+ pedal pulses.   Abdomen: no tenderness, no masses palpated. No hepatosplenomegaly. Bowel sounds positive.  Musculoskeletal: no clubbing / cyanosis. No joint deformity upper and lower extremities. Good ROM, no contractures. Normal muscle tone.  Skin: no rashes, lesions, ulcers. No induration Neurologic: No apparent cranial nerve abnormality, moving all extremities spontaneously. Psychiatric: Normal judgment and insight. Alert and oriented x 3. Normal mood.   Labs on Admission: I have personally reviewed following labs and imaging studies  CBC: Recent Labs  Lab 03/02/20 1627  WBC 8.9  NEUTROABS 5.3  HGB 7.8*  HCT 25.6*  MCV 101.2*  PLT 123456*   Basic Metabolic Panel: Recent Labs  Lab 03/02/20 1627  NA 133*  K 3.4*  CL 94*  CO2 15*  GLUCOSE  98  BUN 14  CREATININE 1.76*  CALCIUM 8.2*   Liver Function Tests: Recent Labs  Lab 03/02/20 1627  AST 104*  ALT 40  ALKPHOS 106  BILITOT 1.2  PROT 7.5  ALBUMIN 3.9   Recent Labs  Lab 03/02/20 1637  LIPASE 184*   Coagulation Profile: Recent Labs  Lab 03/02/20 1627  INR 1.3*   Urine analysis:    Component Value Date/Time   COLORURINE YELLOW 12/08/2019 2330   APPEARANCEUR CLEAR 12/08/2019 2330   LABSPEC 1.008 12/08/2019 2330   PHURINE 5.0 12/08/2019 2330   GLUCOSEU NEGATIVE 12/08/2019 2330   HGBUR SMALL (A) 12/08/2019 2330   BILIRUBINUR NEGATIVE 12/08/2019 2330   KETONESUR 5 (A) 12/08/2019 2330   PROTEINUR NEGATIVE 12/08/2019 2330   NITRITE NEGATIVE 12/08/2019 2330   LEUKOCYTESUR NEGATIVE 12/08/2019 2330    Radiological Exams on Admission: No  results found.  EKG: None.   Assessment/Plan Principal Problem:   Symptomatic anemia Active Problems:   Alcoholism ([place])   Essential hypertension   Metabolic acidosis   Alcoholic ketosis   Abnormal LFTs (liver function tests)   GI bleed   Acute symptomatic anemia likely from upper GI blood loss- hemoglobin 7.8, gradual downtrend over the past 3 months from baseline  ~ 11.  Stool FOBT positive.  History of alcohol abuse.  Denies NSAID use. -1 units PRBC ordered for transfusion - Monitor hemoglobin closely -IV Protonix 80 mg bolus and continue Protonix drip -Clear liquid diet, n.p.o. midnight - EDP talked to gastroenterologist patient to be seen by Dr. Laural Golden in a.m.  Alcoholic ketoacidosis-blood alcohol level 0000000, last alcoholic beverage just prior to arrival in ED.  With anion gap metabolic acidosis-  anion gap of > 20, serum bicarb of 15, blood glucose of 98.  High risk for alcohol withdrawal. - Banana bag 1L with D5 and potassium, then continue with D5N/s + 20kcl at 75cc/hr x 1 day - BMP a.m -Thiamine folate multivitamins -CIWA  PRN, scheduled -Magnesium, phosphorus  Hypertension-blood pressure initially  98/44, currently 130s to 160s.  Not on antihypertensives.  Electrolyte abnormality-sodium 133, potassium 3.4.  Likely due to alcohol abuse. -IV fluids  Transaminitis-chronic, AST 104, ALP 40, likely secondary to alcohol abuse.  DVT prophylaxis: SCDs Code Status: Full code Family Communication: None at bedside Disposition Plan:  ~ 2 days, pending GI evaluation, improvement in metabolic acidosis Consults called: Gastroenterology Admission status: Inpatient, telemetry I certify that at the point of admission it is my clinical judgment that the patient will require inpatient hospital care spanning beyond 2 midnights from the point of admission due to high intensity of service, high risk for further deterioration and high frequency of surveillance required. The following factors support the patient status of inpatient: Upper GI blood loss requiring GI evaluation and likely endoscopy, also with metabolic acidosis.    Bethena Roys MD [facility]  03/02/2020, 9:23 PM

## 2020-03-02 NOTE — ED Provider Notes (Signed)
[facility] Provider Note   CSN: [id number] Arrival date & time: 03/02/20  1529     History Chief Complaint  Patient presents with  . Rectal Bleeding    Clifford [name] is a 69 y.o. male with a past medical history most significant for alcoholism, hypertension, history of prostate cancer and prior history of rectal bleeding presenting with several complaints including a 1 week history of black tarry stools, stating having a bowel movement every other day on average.  His stools are loose but not diarrheal.  he also endorses generalized weakness and fatigue, stating he cannot walk across the room without feeling weak and like he is "going to collapse".  He denies chest pain or shortness of breath.  He has not seen any red bright rectal bleeding or bleeding between bowel movements.  Of note, he was admitted in January of this year for acute kidney injury at which time he was also Hemoccult positive.  His hemoglobin at that time was 9.7.  He left prior to receiving an inpatient GI consult, was advised to follow-up with GI as an outpatient which he has not yet done.  He reports he still drinks, last EtOH consumption was just prior to arrival.  He denies nausea or vomiting, also no abdominal pain, but does endorse intermittent abdominal distention.  He does report a reduced appetite but no recognized weight loss.  He also denies acid reflux, pain triggered by meals, also no chest pain or shortness of breath. He denies nsaid or asa use.  The history is provided by the patient.       Past Medical History:  Diagnosis Date  . Alcoholism ([place])   . Anemia   . High cholesterol   . Hypertension   . Prostate cancer ([place])   . Seizures [facility])     Patient Active Problem List   Diagnosis Date Noted  . Symptomatic anemia 03/02/2020  . Hypomagnesemia 12/09/2019  . Normocytic anemia 12/08/2019  . Positive fecal occult blood test 12/08/2019  . Abnormal LFTs (liver function tests)  12/08/2019  . Alcoholic ketosis [id number]  . Hyponatremia   . Weakness 04/18/2019  . Metabolic acidosis [id number]  . AKI (acute kidney injury) ([place]) 04/18/2019  . Bleeding hemorrhoid   . Thrombocytopenia ([place]) 04/08/2018  . Malignant neoplasm of prostate ([place]) 12/04/2016  . Anemia in other chronic diseases classified elsewhere 07/11/2016  . Alcoholism ([place]) 07/04/2016  . Seizures ([place]) 07/04/2016  . Hyperlipidemia 07/04/2016  . Essential hypertension 07/04/2016    Past Surgical History:  Procedure Laterality Date  . CIRCUMCISION    . COLONOSCOPY N/A 09/13/2016   Procedure: COLONOSCOPY;  Surgeon: Rogene Houston, MD;  [facility];  Service: Endoscopy;  Laterality: N/A;  2:15  . COLONOSCOPY N/A 04/06/2018   Procedure: COLONOSCOPY;  Surgeon: Rogene Houston, MD;  [facility];  Service: Endoscopy;  Laterality: N/A;  . FLEXIBLE SIGMOIDOSCOPY N/A 01/10/2018   Procedure: FLEXIBLE SIGMOIDOSCOPY;  Surgeon: Rogene Houston, MD;  [facility];  Service: Endoscopy;  Laterality: N/A;  1:55  . FLEXIBLE SIGMOIDOSCOPY N/A 06/08/2018   Procedure: FLEXIBLE SIGMOIDOSCOPY;  Surgeon: Danie Binder, MD;  [facility];  Service: Endoscopy;  Laterality: N/A;  . HEMORRHOID SURGERY N/A 04/09/2018   Procedure: HEMORRHOIDECTOMY;  Surgeon: Aviva Signs, MD;  [facility];  Service: General;  Laterality: N/A;  . NO PAST SURGERIES    . POLYPECTOMY  04/06/2018   Procedure: POLYPECTOMY;  Surgeon:  Rogene Houston, MD;  [facility];  Service: Endoscopy;;  sigmoid  . PROSTATE BIOPSY    . VIDEO ASSISTED THORACOSCOPY (VATS)/EMPYEMA Left 07/07/2018   Procedure: VIDEO ASSISTED THORACOSCOPY (VATS)/DRAINAGE OF EMPYEMA, DECORTICATION;  Surgeon: [name], MD;  [facility];  Service: Thoracic;  Laterality: Left;       Family History  Problem Relation Age of Onset  . Alzheimer's disease Mother   . Hypertension Mother   . Hypertension Father    . Stroke Father   . Prostate cancer Father   . Hypertension Brother     Social History   Tobacco Use  . Smoking status: Never Smoker  . Smokeless tobacco: Never Used  Substance Use Topics  . Alcohol use: Yes    Comment: 1-2 bottles of Gin every few days  . Drug use: No    Home Medications Prior to Admission medications   Medication Sig Start Date End Date Taking? Authorizing Provider  folic acid (FOLVITE) 1 MG tablet Take 1 tablet (1 mg total) by mouth daily. Patient not taking: Reported on 03/02/2020 12/10/19   Murlean Iba, MD  HYDROcodone-acetaminophen (NORCO/VICODIN) 5-325 MG tablet Take one tab po q 4 hrs prn pain Patient not taking: Reported on 03/02/2020 02/07/20   Kem Parkinson, PA-C  Multiple Vitamin (MULTIVITAMIN WITH MINERALS) TABS tablet Take 1 tablet by mouth daily. Patient not taking: Reported on 03/02/2020 12/10/19   Irwin Brakeman L, MD  pantoprazole (PROTONIX) 40 MG tablet Take 40 mg by mouth daily.    [provider]  potassium chloride (KLOR-CON) 10 MEQ tablet Take 1 tablet (10 mEq total) by mouth 2 (two) times daily for 3 days. 12/09/19 12/12/19  Johnson, Clanford L, MD  thiamine 100 MG tablet Take 1 tablet (100 mg total) by mouth daily. Patient not taking: Reported on 03/02/2020 12/10/19   Murlean Iba, MD    Allergies    Lisinopril  Review of Systems   Review of Systems  Constitutional: Positive for appetite change and fatigue. Negative for fever.  HENT: Negative for congestion.   Eyes: Negative.   Respiratory: Negative for chest tightness and shortness of breath.   Cardiovascular: Negative for chest pain.  Gastrointestinal: Positive for abdominal distention and blood in stool. Negative for abdominal pain, nausea and vomiting.  Genitourinary: Negative.   Musculoskeletal: Negative for arthralgias, joint swelling and neck pain.  Skin: Negative.  Negative for rash and wound.  Neurological: Positive for weakness. Negative for  dizziness, light-headedness, numbness and headaches.  Psychiatric/Behavioral: Negative.     Physical Exam Updated Vital Signs BP (!) 151/67   Pulse (!) 101   Temp 98.2 F (36.8 C) (Oral)   Resp 18   Ht 5\' 6"  (1.676 m)   Wt 63.5 kg   SpO2 99%   BMI 22.60 kg/m   Physical Exam Vitals and nursing note reviewed.  Constitutional:      Appearance: He is well-developed.  HENT:     Head: Normocephalic and atraumatic.  Eyes:     Conjunctiva/sclera: Conjunctivae normal.  Cardiovascular:     Rate and Rhythm: Regular rhythm. Tachycardia present.     Heart sounds: Normal heart sounds.  Pulmonary:     Effort: Pulmonary effort is normal.     Breath sounds: Normal breath sounds. No wheezing.  Abdominal:     General: Bowel sounds are normal. There is distension.     Palpations: Abdomen is soft. There is no fluid wave or mass.  Tenderness: There is no abdominal tenderness. There is no guarding.  Genitourinary:    Comments: Strongly hemoccult positive (collected by RN). Musculoskeletal:        General: Normal range of motion.     Cervical back: Normal range of motion.  Skin:    General: Skin is warm and dry.  Neurological:     Mental Status: He is alert.     ED Results / Procedures / Treatments   Labs (all labs ordered are listed, but only abnormal results are displayed) Labs Reviewed  COMPREHENSIVE METABOLIC PANEL - Abnormal; Notable for the following components:      Result Value   Sodium 133 (*)    Potassium 3.4 (*)    Chloride 94 (*)    CO2 15 (*)    Creatinine, Ser 1.76 (*)    Calcium 8.2 (*)    AST 104 (*)    GFR calc non Af Amer 39 (*)    GFR calc Af Amer 45 (*)    Anion gap >20 (*)    All other components within normal limits  CBC WITH DIFFERENTIAL/PLATELET - Abnormal; Notable for the following components:   RBC 2.53 (*)    Hemoglobin 7.8 (*)    HCT 25.6 (*)    MCV 101.2 (*)    Platelets 144 (*)    Monocytes Absolute 2.2 (*)    All other components within  normal limits  PROTIME-INR - Abnormal; Notable for the following components:   Prothrombin Time 15.7 (*)    INR 1.3 (*)    All other components within normal limits  LIPASE, BLOOD - Abnormal; Notable for the following components:   Lipase 184 (*)    All other components within normal limits  ETHANOL - Abnormal; Notable for the following components:   Alcohol, Ethyl (B) 195 (*)    All other components within normal limits  POC OCCULT BLOOD, ED - Abnormal; Notable for the following components:   Fecal Occult Bld POSITIVE (*)    All other components within normal limits  SARS CORONAVIRUS 2 (TAT 6-24 HRS)  TYPE AND SCREEN  PREPARE RBC (CROSSMATCH)    EKG None  Radiology No results found.  Procedures Procedures (including critical care time)  Medications Ordered in ED Medications  0.9 %  sodium chloride infusion (has no administration in time range)  pantoprazole (PROTONIX) injection 40 mg (40 mg Intravenous Given 03/02/20 1659)    ED Course  I have reviewed the triage vital signs and the nursing notes.  Pertinent labs & imaging results that were available during my care of the patient were reviewed by me and considered in my medical decision making (see chart for details).  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  Labs reviewed today with significant anemia of 7.8, macrocytic, with last hgb 9.7 three months ago, now symptomatic.  Strongly hemoccult positive.  Also with elevation in Lipase, but no c/o abdominal pain, n/v at this time.  He does endorse poor PO intake.  EtOH positive, AST elevated at 104 and c/w etoh habit.  Bilirubin and albumin normal, INR elevated at  1.3.    Pt with symptomatic anemia  indicating need for admission.  Call placed to GI to discuss pt.  Dr. Oneida Alar agrees with need for admission, no indication of upper GI source, PPI, no octreotide. Plan for Dr. Laural Golden to see in am. Would transfuse 1 unit of prbc's as he will need to be over 8.0 for anesthesia.  Anticipate endo/colon tomorrow.  Discussed with Dr. Denton Brick who accepts pt for admission.     Final Clinical Impression(s) / ED Diagnoses Final diagnoses:  Symptomatic anemia  Lower GI bleed  Blood alcohol level of 120-199 mg/100 ml  Alcohol-induced acute pancreatitis, unspecified complication status    Rx / DC Orders ED Discharge Orders    None       Landis Martins 03/02/20 1846    Noemi Chapel, MD 03/02/20 2112

## 2020-03-02 NOTE — ED Notes (Signed)
ED TO INPATIENT HANDOFF REPORT  ED Nurse Name and Phone #: Marita Kansas [phone]  S Name/Age/Gender Clifford [name] 69 y.o. male Room/Bed: APA02/APA02  Code Status   Code Status: Prior  Home/SNF/Other home Patient oriented to: situation Is this baseline? Yes   Triage Complete: Triage complete  Chief Complaint Symptomatic anemia [D64.9]  Triage Note Pt c/o feeling weak and having rectal bleeding; pt states his stools are black and tarry    Allergies Allergies  Allergen Reactions  . Lisinopril Swelling    Level of Care/Admitting Diagnosis ED Disposition    ED Disposition Condition [facility] Area: [facility] [id number]  Level of Care: Telemetry [5]  Covid Evaluation: Asymptomatic Screening Protocol (No Symptoms)  Diagnosis: Symptomatic anemia [id number]  Admitting Physician: Bethena Roys [id number]  Attending Physician: Bethena Roys Nessa.Cuff       B Medical/Surgery History Past Medical History:  Diagnosis Date  . Alcoholism ([place])   . Anemia   . High cholesterol   . Hypertension   . Prostate cancer ([place])   . Seizures ([place])    Past Surgical History:  Procedure Laterality Date  . CIRCUMCISION    . COLONOSCOPY N/A 09/13/2016   Procedure: COLONOSCOPY;  Surgeon: Rogene Houston, MD;  [facility];  Service: Endoscopy;  Laterality: N/A;  2:15  . COLONOSCOPY N/A 04/06/2018   Procedure: COLONOSCOPY;  Surgeon: Rogene Houston, MD;  [facility];  Service: Endoscopy;  Laterality: N/A;  . FLEXIBLE SIGMOIDOSCOPY N/A 01/10/2018   Procedure: FLEXIBLE SIGMOIDOSCOPY;  Surgeon: Rogene Houston, MD;  [facility];  Service: Endoscopy;  Laterality: N/A;  1:55  . FLEXIBLE SIGMOIDOSCOPY N/A 06/08/2018   Procedure: FLEXIBLE SIGMOIDOSCOPY;  Surgeon: Danie Binder, MD;  [facility];  Service: Endoscopy;  Laterality: N/A;  . HEMORRHOID SURGERY N/A 04/09/2018   Procedure: HEMORRHOIDECTOMY;  Surgeon:  Aviva Signs, MD;  Location: [facility];  Service: General;  Laterality: N/A;  . NO PAST SURGERIES    . POLYPECTOMY  04/06/2018   Procedure: POLYPECTOMY;  Surgeon: Rogene Houston, MD;  [facility];  Service: Endoscopy;;  sigmoid  . PROSTATE BIOPSY    . VIDEO ASSISTED THORACOSCOPY (VATS)/EMPYEMA Left 07/07/2018   Procedure: VIDEO ASSISTED THORACOSCOPY (VATS)/DRAINAGE OF EMPYEMA, DECORTICATION;  Surgeon: [name], MD;  Location: [place];  Service: Thoracic;  Laterality: Left;     A IV Location/Drains/Wounds Patient Lines/Drains/Airways Status   Active Line/Drains/Airways    Name:   Placement date:   Placement time:   Site:   Days:   Peripheral IV 03/02/20 Right Forearm   03/02/20    1700    Forearm   less than 1   External Urinary Catheter   04/19/19    0443    --   318          Intake/Output Last 24 hours No intake or output data in the 24 hours ending 03/02/20 1926  Labs/Imaging Results for orders placed or performed during the hospital encounter of 03/02/20 (from the past 48 hour(s))  Comprehensive metabolic panel     Status: Abnormal   Collection Time: 03/02/20  4:27 PM  Result Value Ref Range   Sodium 133 (L) 135 - 145 mmol/L   Potassium 3.4 (L) 3.5 - 5.1 mmol/L   Chloride 94 (L) 98 - 111 mmol/L   CO2 15 (L) 22 - 32 mmol/L   Glucose, Bld 98 70 - 99 mg/dL  Comment: Glucose reference range applies only to samples taken after fasting for at least 8 hours.   BUN 14 8 - 23 mg/dL   Creatinine, Ser 1.76 (H) 0.61 - 1.24 mg/dL   Calcium 8.2 (L) 8.9 - 10.3 mg/dL   Total Protein 7.5 6.5 - 8.1 g/dL   Albumin 3.9 3.5 - 5.0 g/dL   AST 104 (H) 15 - 41 U/L   ALT 40 0 - 44 U/L   Alkaline Phosphatase 106 38 - 126 U/L   Total Bilirubin 1.2 0.3 - 1.2 mg/dL   GFR calc non Af Amer 39 (L) >60 mL/min   GFR calc Af Amer 45 (L) >60 mL/min   Anion gap >20 (H) 5 - 15    Comment: Performed at [facility], [address] [place], [place] [address]  CBC WITH DIFFERENTIAL      Status: Abnormal   Collection Time: 03/02/20  4:27 PM  Result Value Ref Range   WBC 8.9 4.0 - 10.5 K/uL   RBC 2.53 (L) 4.22 - 5.81 MIL/uL   Hemoglobin 7.8 (L) 13.0 - 17.0 g/dL   HCT 25.6 (L) 39.0 - 52.0 %   MCV 101.2 (H) 80.0 - 100.0 fL   MCH 30.8 26.0 - 34.0 pg   MCHC 30.5 30.0 - 36.0 g/dL   RDW 13.1 11.5 - 15.5 %   Platelets 144 (L) 150 - 400 K/uL   nRBC 0.0 0.0 - 0.2 %   Neutrophils Relative % 59 %   Neutro Abs 5.3 1.7 - 7.7 K/uL   Lymphocytes Relative 12 %   Lymphs Abs 1.0 0.7 - 4.0 K/uL   Monocytes Relative 24 %   Monocytes Absolute 2.2 (H) 0.1 - 1.0 K/uL   Eosinophils Relative 4 %   Eosinophils Absolute 0.3 0.0 - 0.5 K/uL   Basophils Relative 0 %   Basophils Absolute 0.0 0.0 - 0.1 K/uL   Immature Granulocytes 1 %   Abs Immature Granulocytes 0.07 0.00 - 0.07 K/uL    Comment: Performed at [facility], [address] [place], [place] [address]  Protime-INR     Status: Abnormal   Collection Time: 03/02/20  4:27 PM  Result Value Ref Range   Prothrombin Time 15.7 (H) 11.4 - 15.2 seconds   INR 1.3 (H) 0.8 - 1.2    Comment: (NOTE) INR goal varies based on device and disease states. Performed at [facility], [address] [place], [place] [address]   Type and screen [facility]     Status: None (Preliminary result)   Collection Time: 03/02/20  4:28 PM  Result Value Ref Range   ABO/RH(D) A POS    Antibody Screen NEG    Sample Expiration      03/05/2020,2359 Performed at [facility], [address] [place], [place] [address]    Unit Number [id number]    Blood Component Type RED CELLS,LR    Unit division 00    Status of Unit ALLOCATED    Transfusion Status OK TO TRANSFUSE    Crossmatch Result Compatible   Prepare RBC (crossmatch)     Status: None   Collection Time: 03/02/20  4:28 PM  Result Value Ref Range   Order Confirmation      ORDER PROCESSED BY BLOOD BANK Performed at [facility], [address] [place], [place] [address]   Lipase, blood      Status: Abnormal   Collection Time: 03/02/20  4:37 PM  Result Value Ref Range   Lipase  184 (H) 11 - 51 U/L    Comment: Performed at [facility], [address], [place], [place] [address]  Ethanol     Status: Abnormal   Collection Time: 03/02/20  4:37 PM  Result Value Ref Range   Alcohol, Ethyl (B) 195 (H) <10 mg/dL    Comment: (NOTE) Lowest detectable limit for serum alcohol is 10 mg/dL. For medical purposes only. Performed at [facility], [address], [place], [place] [address]   POC occult blood, ED Provider will collect     Status: Abnormal   Collection Time: 03/02/20  5:09 PM  Result Value Ref Range   Fecal Occult Bld POSITIVE (A) NEGATIVE   No results found.  Pending Labs Unresulted Labs (From admission, onward)    Start     Ordered   03/02/20 1846  SARS CORONAVIRUS 2 (TAT 6-24 HRS) Nasopharyngeal Nasopharyngeal Swab  (Tier 3 (TAT 6-24 hrs))  Once,   STAT    Question Answer Comment  Is this test for diagnosis or screening Screening   Symptomatic for COVID-19 as defined by CDC No   Hospitalized for COVID-19 No   Admitted to ICU for COVID-19 No   Previously tested for COVID-19 Yes   Resident in a congregate (group) care setting No   Employed in healthcare setting No   Has patient completed COVID vaccination(s) (2 doses of Pfizer/Moderna 1 dose of [employer]) Unknown      03/02/20 1845          Vitals/Pain Today's Vitals   03/02/20 1703 03/02/20 1730 03/02/20 1800 03/02/20 1830  BP: (!) 131/98 (!) 145/72 (!) 151/67 (!) 162/82  Pulse: (!) 107 91 (!) 101 95  Resp: (!) 22 18 18  (!) 23  Temp:      TempSrc:      SpO2: 96% 100% 99% 100%  Weight:      Height:      PainSc:        Isolation Precautions No active isolations  Medications Medications  0.9 %  sodium chloride infusion (has no administration in time range)  pantoprazole (PROTONIX) injection 40 mg (40 mg Intravenous Given 03/02/20 1659)    Mobility walks Low fall risk   Focused  Assessments    R Recommendations: See Admitting Provider Note  Report given to:   Additional Notes: pt drinks etoh daily

## 2020-03-02 NOTE — ED Provider Notes (Signed)
Medical screening examination/treatment/procedure(s) were conducted as a shared visit with non-physician practitioner(s) and myself.  I personally evaluated the patient during the encounter.  Clinical Impression:   Final diagnoses:  Symptomatic anemia  Lower GI bleed  Blood alcohol level of 120-199 mg/100 ml  Alcohol-induced acute pancreatitis, unspecified complication status    This patient is a 69 year old male, known alcoholic, has been drinking today, he complains of some discolored stools, some generalized weakness, states he drinks lots of alcohol but does not take any aspirin-containing products.  On exam has a nontender abdomen, he has no active tachycardia but has extremely pale conjunctive a.  Labs reveal that the patient has a new anemia, this is significant and will need further evaluation.  Gastroenterology will be consulted and the patient will be admitted to the hospital.   Noemi Chapel, MD 03/02/20 2112

## 2020-03-02 NOTE — ED Triage Notes (Signed)
Pt c/o feeling weak and having rectal bleeding; pt states his stools are black and tarry

## 2020-03-03 ENCOUNTER — Inpatient Hospital Stay (HOSPITAL_COMMUNITY): Payer: Medicare Other | Admitting: Anesthesiology

## 2020-03-03 ENCOUNTER — Encounter (HOSPITAL_COMMUNITY): Payer: Self-pay | Admitting: Internal Medicine

## 2020-03-03 ENCOUNTER — Encounter (HOSPITAL_COMMUNITY)
Admission: EM | Disposition: A | Discharge status: Home Health | Payer: Self-pay | Source: Home / Self Care | Attending: Internal Medicine

## 2020-03-03 DIAGNOSIS — K921 Melena: Secondary | ICD-10-CM

## 2020-03-03 DIAGNOSIS — K259 Gastric ulcer, unspecified as acute or chronic, without hemorrhage or perforation: Secondary | ICD-10-CM

## 2020-03-03 DIAGNOSIS — F1010 Alcohol abuse, uncomplicated: Secondary | ICD-10-CM

## 2020-03-03 DIAGNOSIS — D649 Anemia, unspecified: Secondary | ICD-10-CM

## 2020-03-03 DIAGNOSIS — K228 Other specified diseases of esophagus: Secondary | ICD-10-CM

## 2020-03-03 DIAGNOSIS — K2920 Alcoholic gastritis without bleeding: Secondary | ICD-10-CM

## 2020-03-03 DIAGNOSIS — K7010 Alcoholic hepatitis without ascites: Secondary | ICD-10-CM

## 2020-03-03 DIAGNOSIS — K449 Diaphragmatic hernia without obstruction or gangrene: Secondary | ICD-10-CM

## 2020-03-03 HISTORY — PX: BIOPSY: SHX5522

## 2020-03-03 HISTORY — PX: ESOPHAGOGASTRODUODENOSCOPY (EGD) WITH PROPOFOL: SHX5813

## 2020-03-03 LAB — TYPE AND SCREEN
ABO/RH(D): A POS
Antibody Screen: NEGATIVE
Unit division: 0

## 2020-03-03 LAB — BASIC METABOLIC PANEL
BUN: 13 mg/dL (ref 8–23)
CO2: 14 mmol/L — ABNORMAL LOW (ref 22–32)
Calcium: 8.3 mg/dL — ABNORMAL LOW (ref 8.9–10.3)
Chloride: 96 mmol/L — ABNORMAL LOW (ref 98–111)
Creatinine, Ser: 1.63 mg/dL — ABNORMAL HIGH (ref 0.61–1.24)
GFR calc Af Amer: 49 mL/min — ABNORMAL LOW (ref >60)
GFR calc non Af Amer: 42 mL/min — ABNORMAL LOW (ref >60)
Glucose, Bld: 96 mg/dL (ref 70–99)
Potassium: 3.7 mmol/L (ref 3.5–5.1)
Sodium: 136 mmol/L (ref 135–145)

## 2020-03-03 LAB — CBC
HCT: 29.4 % — ABNORMAL LOW (ref 39.0–52.0)
Hemoglobin: 9.1 g/dL — ABNORMAL LOW (ref 13.0–17.0)
MCH: 30.5 pg (ref 26.0–34.0)
MCHC: 31 g/dL (ref 30.0–36.0)
MCV: 98.7 fL (ref 80.0–100.0)
Platelets: 126 10*3/uL — ABNORMAL LOW (ref 150–400)
RBC: 2.98 MIL/uL — ABNORMAL LOW (ref 4.22–5.81)
RDW: 15.3 % (ref 11.5–15.5)
WBC: 7.5 10*3/uL (ref 4.0–10.5)
nRBC: 0 % (ref 0.0–0.2)

## 2020-03-03 LAB — BPAM RBC
Blood Product Expiration Date: 202105082359
ISSUE DATE / TIME: 202104202208
Unit Type and Rh: 6200

## 2020-03-03 LAB — SARS CORONAVIRUS 2 (TAT 6-24 HRS): SARS Coronavirus 2: NEGATIVE

## 2020-03-03 SURGERY — ESOPHAGOGASTRODUODENOSCOPY (EGD) WITH PROPOFOL
Anesthesia: General

## 2020-03-03 MED ORDER — LACTATED RINGERS IV SOLN
INTRAVENOUS | Status: DC
  Administered 2020-03-03: 15:00:00 1000 mL via INTRAVENOUS

## 2020-03-03 MED ORDER — MAGNESIUM SULFATE 2 GM/50ML IV SOLN
2.0000 g | Freq: Once | INTRAVENOUS | Status: AC
  Administered 2020-03-03: 13:00:00 2 g via INTRAVENOUS
  Filled 2020-03-03: qty 50

## 2020-03-03 MED ORDER — PROPOFOL 10 MG/ML IV BOLUS
INTRAVENOUS | Status: DC | PRN
  Administered 2020-03-03: 30 mg via INTRAVENOUS
  Administered 2020-03-03 (×2): 40 mg via INTRAVENOUS

## 2020-03-03 MED ORDER — PROPOFOL 10 MG/ML IV BOLUS
INTRAVENOUS | Status: AC
  Filled 2020-03-03: qty 40

## 2020-03-03 MED ORDER — VITAMIN K1 10 MG/ML IJ SOLN
10.0000 mg | Freq: Once | INTRAMUSCULAR | Status: AC
  Administered 2020-03-03: 21:00:00 10 mg via SUBCUTANEOUS
  Filled 2020-03-03: qty 1

## 2020-03-03 MED ORDER — LIDOCAINE HCL 1 % IJ SOLN
INTRAMUSCULAR | Status: DC | PRN
  Administered 2020-03-03: 60 mg via INTRADERMAL

## 2020-03-03 MED ORDER — LACTATED RINGERS IV SOLN: INTRAVENOUS | Status: DC | PRN

## 2020-03-03 MED ORDER — PROPOFOL 500 MG/50ML IV EMUL
INTRAVENOUS | Status: DC | PRN
  Administered 2020-03-03: 150 ug/kg/min via INTRAVENOUS
  Administered 2020-03-03: 30 mg via INTRAVENOUS

## 2020-03-03 NOTE — Consult Note (Signed)
Referring Provider: Heath Lark, DO Primary Care Physician:  [name], FNP Primary Gastroenterologist:  Dr. Laural Golden  Reason for Consultation:   Melena and anemia.  HPI:   Patient is 69 year old Afro-American male who has history of alcohol abuse and gives remote history of peptic ulcer disease who has been experiencing tarry stool for the last 3 to 4 days.  He also noted progressive weakness and exertional dyspnea.  He came to emergency room yesterday for evaluation.  His hemoglobin was 7.8 g.  He generally runs hemoglobin of around 10 g.  He was noted to have heme positive stool.  Patient was admitted to hospitalist service.  He has received 1 unit of PRBCs and his hemoglobin is up to 9.1 g.  Patient is on pantoprazole infusion.  Patient said he has not had any more black stools.  He denies abdominal or chest pain.  He also denies nausea vomiting heartburn or dysphagia.  He he states he was diagnosed with peptic ulcer disease several years ago.  He does not remember if he had pain or bleeding. Patient has history of rectal bleeding secondary to telangiectasia which have been ablated with APC on 2 occasions and he also had hemorrhoidectomy in 2019 for large internal hemorrhoids.  He says he is not passing bright red or burgundy blood. He says his appetite is good.  He has not lost any weight. He drinks a pint of gin every day.  Last drink was yesterday.  When he came to emergency room his alcohol level was 195.  He has never smoked cigarettes. He is retired.  He worked in Clinical cytogeneticist for 30 years.  He has 3 grownup sons.  Past Medical History:  Diagnosis Date  . Alcoholism ([place])   . Anemia   . High cholesterol   . Hypertension   . Prostate cancer ([place])   . Seizures ([place])     Past Surgical History:  Procedure Laterality Date  . CIRCUMCISION    . COLONOSCOPY N/A 09/13/2016   Procedure: COLONOSCOPY;  Surgeon: Rogene Houston, MD;  [facility];  Service: Endoscopy;   Laterality: N/A;  2:15  . COLONOSCOPY N/A 04/06/2018   Procedure: COLONOSCOPY;  Surgeon: Rogene Houston, MD;  [facility];  Service: Endoscopy;  Laterality: N/A;  . FLEXIBLE SIGMOIDOSCOPY N/A 01/10/2018   Procedure: FLEXIBLE SIGMOIDOSCOPY;  Surgeon: Rogene Houston, MD;  [facility];  Service: Endoscopy;  Laterality: N/A;  1:55  . FLEXIBLE SIGMOIDOSCOPY N/A 06/08/2018   Procedure: FLEXIBLE SIGMOIDOSCOPY;  Surgeon: Danie Binder, MD;  [facility];  Service: Endoscopy;  Laterality: N/A;  . HEMORRHOID SURGERY N/A 04/09/2018   Procedure: HEMORRHOIDECTOMY;  Surgeon: Aviva Signs, MD;  Location[facility];  Service: General;  Laterality: N/A;  . NO PAST SURGERIES    . POLYPECTOMY  04/06/2018   Procedure: POLYPECTOMY;  Surgeon: Rogene Houston, MD;  [facility];  Service: Endoscopy;;  sigmoid  . PROSTATE BIOPSY    . VIDEO ASSISTED THORACOSCOPY (VATS)/EMPYEMA Left 07/07/2018   Procedure: VIDEO ASSISTED THORACOSCOPY (VATS)/DRAINAGE OF EMPYEMA, DECORTICATION;  Surgeon: [name], MD;  Location: [place];  Service: Thoracic;  Laterality: Left;    Prior to Admission medications   Medication Sig Start Date End Date Taking? Authorizing Provider  folic acid (FOLVITE) 1 MG tablet Take 1 tablet (1 mg total) by mouth daily. Patient not taking: Reported on 03/02/2020 12/10/19   Murlean Iba, MD  HYDROcodone-acetaminophen (NORCO/VICODIN)  5-325 MG tablet Take one tab po q 4 hrs prn pain Patient not taking: Reported on 03/02/2020 02/07/20   Kem Parkinson, PA-C  Multiple Vitamin (MULTIVITAMIN WITH MINERALS) TABS tablet Take 1 tablet by mouth daily. Patient not taking: Reported on 03/02/2020 12/10/19   Irwin Brakeman L, MD  pantoprazole (PROTONIX) 40 MG tablet Take 40 mg by mouth daily.    [provider]  potassium chloride (KLOR-CON) 10 MEQ tablet Take 1 tablet (10 mEq total) by mouth 2 (two) times daily for 3 days. 12/09/19 12/12/19  Johnson,  Clanford L, MD  thiamine 100 MG tablet Take 1 tablet (100 mg total) by mouth daily. Patient not taking: Reported on 03/02/2020 12/10/19   Murlean Iba, MD    Current Facility-Administered Medications  Medication Dose Route Frequency Provider Last Rate Last Admin  . 0.9 %  sodium chloride infusion  10 mL/hr Intravenous Once Emokpae, Ejiroghene E, MD      . acetaminophen (TYLENOL) tablet 650 mg  650 mg Oral Q6H PRN Emokpae, Ejiroghene E, MD       Or  . acetaminophen (TYLENOL) suppository 650 mg  650 mg Rectal Q6H PRN Emokpae, Ejiroghene E, MD      . dextrose 5 % and 0.9 % NaCl with KCl 20 mEq/L infusion   Intravenous Continuous Emokpae, Ejiroghene E, MD      . folic acid (FOLVITE) tablet 1 mg  1 mg Oral Daily Emokpae, Ejiroghene E, MD   1 mg at 03/03/20 1037  . LORazepam (ATIVAN) injection 0-4 mg  0-4 mg Intravenous Q6H Emokpae, Ejiroghene E, MD   Stopped at 03/03/20 0430   Followed by  . [START ON 03/04/2020] LORazepam (ATIVAN) injection 0-4 mg  0-4 mg Intravenous Q12H Emokpae, Ejiroghene E, MD      . LORazepam (ATIVAN) tablet 1-4 mg  1-4 mg Oral Q1H PRN Emokpae, Ejiroghene E, MD       Or  . LORazepam (ATIVAN) injection 1-4 mg  1-4 mg Intravenous Q1H PRN Emokpae, Ejiroghene E, MD      . magnesium sulfate IVPB 2 g 50 mL  2 g Intravenous Once Manuella Ghazi, Pratik D, DO      . multivitamin with minerals tablet 1 tablet  1 tablet Oral Daily Emokpae, Ejiroghene E, MD   1 tablet at 03/03/20 1039  . ondansetron (ZOFRAN) tablet 4 mg  4 mg Oral Q6H PRN Emokpae, Ejiroghene E, MD       Or  . ondansetron (ZOFRAN) injection 4 mg  4 mg Intravenous Q6H PRN Emokpae, Ejiroghene E, MD      . pantoprazole (PROTONIX) 80 mg in sodium chloride 0.9 % 100 mL (0.8 mg/mL) infusion  8 mg/hr Intravenous Continuous Emokpae, Ejiroghene E, MD 10 mL/hr at 03/03/20 0931 8 mg/hr at 03/03/20 0931  . polyethylene glycol (MIRALAX / GLYCOLAX) packet 17 g  17 g Oral Daily PRN Emokpae, Ejiroghene E, MD      . thiamine tablet 100 mg   100 mg Oral Daily Emokpae, Ejiroghene E, MD   100 mg at 03/03/20 1038   Or  . thiamine (B-1) injection 100 mg  100 mg Intravenous Daily Emokpae, Ejiroghene E, MD        Allergies as of 03/02/2020 - Review Complete 03/02/2020  Allergen Reaction Noted  . Lisinopril Swelling 09/11/2016    Family History  Problem Relation Age of Onset  . Alzheimer's disease Mother   . Hypertension Mother   . Hypertension Father   . Stroke Father   .  Prostate cancer Father   . Hypertension Brother     Social History   Socioeconomic History  . Marital status: Married    Spouse name: Not on file  . Number of children: Not on file  . Years of education: Not on file  . Highest education level: Not on file  Occupational History  . Not on file  Tobacco Use  . Smoking status: Never Smoker  . Smokeless tobacco: Never Used  Substance and Sexual Activity  . Alcohol use: Yes    Comment: 1-2 bottles of Gin every few days  . Drug use: No  . Sexual activity: Not on file  Other Topics Concern  . Not on file  Social History Narrative  . Not on file   Social Determinants of Health   Financial Resource Strain:   . Difficulty of Paying Living Expenses:   Food Insecurity:   . Worried About Charity fundraiser in the Last Year:   . Arboriculturist in the Last Year:   Transportation Needs:   . Film/video editor (Medical):   Marland Kitchen Lack of Transportation (Non-Medical):   Physical Activity:   . Days of Exercise per Week:   . Minutes of Exercise per Session:   Stress:   . Feeling of Stress :   Social Connections:   . Frequency of Communication with Friends and Family:   . Frequency of Social Gatherings with Friends and Family:   . Attends Religious Services:   . Active Member of Clubs or Organizations:   . Attends Archivist Meetings:   Marland Kitchen Marital Status:   Intimate Partner Violence:   . Fear of Current or Ex-Partner:   . Emotionally Abused:   Marland Kitchen Physically Abused:   . Sexually Abused:      Review of Systems: See HPI, otherwise normal ROS  Physical Exam: Temp:  [98.2 F (36.8 C)-100.4 F (38 C)] 98.9 F (37.2 C) (04/21 1100) Pulse Rate:  [91-113] 95 (04/21 1100) Resp:  [16-25] 21 (04/21 1100) BP: (98-164)/(44-98) 148/70 (04/21 1100) SpO2:  [96 %-100 %] 100 % (04/21 1206) Weight:  [30 kg-63.5 kg] 30 kg (04/20 2026) Last BM Date: 03/01/20  Patient is alert and in no acute distress. He responds appropriate to questions. He does not have tremors. Conjunctiva is pale.  Sclerae nonicteric. Oropharyngeal mucosa is normal.  He has partial upper and lower dental plates. Neck without masses thyromegaly or lymphadenopathy. Cardiac exam with regular rhythm normal S1 and S2.  No murmur gallop noted. Auscultation lungs reveal vesicular breath sounds bilaterally. Abdomen is symmetrical.  Bowel sounds are normal.  On palpation abdomen is soft and nontender with organomegaly or masses. He does not have peripheral edema or clubbing. Left arm is larger than the right arm.  There is no pitting edema or skin discoloration.   Lab Results: Recent Labs    03/02/20 1627 03/02/20 2121 03/03/20 0450  WBC 8.9  --  7.5  HGB 7.8* 7.8* 9.1*  HCT 25.6* 25.6* 29.4*  PLT 144*  --  126*   BMET Recent Labs    03/02/20 1627 03/03/20 0450  NA 133* 136  K 3.4* 3.7  CL 94* 96*  CO2 15* 14*  GLUCOSE 98 96  BUN 14 13  CREATININE 1.76* 1.63*  CALCIUM 8.2* 8.3*   LFT Recent Labs    03/02/20 1627  PROT 7.5  ALBUMIN 3.9  AST 104*  ALT 40  ALKPHOS 106  BILITOT 1.2   PT/INR  Recent Labs    03/02/20 1627  LABPROT 15.7*  INR 1.3*    Assessment;  Patient is 69 year old Afro-American male who presents with few day history of melena and anemia.  He has received 1 unit of PRBCs and hemoglobin is gone up from 7.8 g to 9.1.  He appears to be hemodynamically stable.  Need to rule out peptic ulcer disease or other abnormalities in upper GI tract before considering other sources of  bleeding.  With history of alcohol abuse 1 has to worry about variceal bleed with index of suspicion is low. 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  Acute on chronic anemia secondary to GI bleed.  Alcohol abuse.  Ethanol level on admission was 195 mg/DL.  Patient does not appear to have any withdrawal symptoms.  He has not been treated with benzodiazepine.  Alcoholic hepatitis.  He does not have any stigmata of cirrhosis.  Mild thrombocytopenia may be due to alcohol induced marrow toxicity.  Recommendations;  Esophagogastroduodenoscopy with therapeutic intervention under monitored anesthesia care later today.    LOS: 1 day   Saydie Gerdts  03/03/2020, 12:13 PM

## 2020-03-03 NOTE — Anesthesia Preprocedure Evaluation (Signed)
Anesthesia Evaluation  Patient identified by MRN, date of birth, ID band Patient awake    Reviewed: Allergy & Precautions, H&P , NPO status , Patient's Chart, lab work & pertinent test results, reviewed documented beta blocker date and time   Airway Mallampati: II  TM Distance: >3 FB Neck ROM: full    Dental no notable dental hx.    Pulmonary neg pulmonary ROS,    Pulmonary exam normal breath sounds clear to auscultation       Cardiovascular Exercise Tolerance: Good hypertension, negative cardio ROS   Rhythm:regular Rate:Normal     Neuro/Psych Seizures -,  PSYCHIATRIC DISORDERS    GI/Hepatic negative GI ROS, Neg liver ROS,   Endo/Other  negative endocrine ROS  Renal/GU Renal disease  negative genitourinary   Musculoskeletal   Abdominal   Peds  Hematology  (+) Blood dyscrasia, anemia ,   Anesthesia Other Findings   Reproductive/Obstetrics negative OB ROS                             Anesthesia Physical Anesthesia Plan  ASA: III and emergent  Anesthesia Plan: General   Post-op Pain Management:    Induction:   PONV Risk Score and Plan: 2 and Propofol infusion  Airway Management Planned:   Additional Equipment:   Intra-op Plan:   Post-operative Plan:   Informed Consent: I have reviewed the patients History and Physical, chart, labs and discussed the procedure including the risks, benefits and alternatives for the proposed anesthesia with the patient or authorized representative who has indicated his/her understanding and acceptance.     Dental Advisory Given  Plan Discussed with: CRNA  Anesthesia Plan Comments:         Anesthesia Quick Evaluation

## 2020-03-03 NOTE — Anesthesia Postprocedure Evaluation (Signed)
Anesthesia Post Note  Patient: JOSEY SCHOFF  Procedure(s) Performed: ESOPHAGOGASTRODUODENOSCOPY (EGD) WITH PROPOFOL (N/A ) BIOPSY  Patient location during evaluation: PACU Anesthesia Type: General Level of consciousness: awake and alert, oriented and patient cooperative Pain management: pain level controlled Vital Signs Assessment: post-procedure vital signs reviewed and stable Respiratory status: spontaneous breathing, nonlabored ventilation, respiratory function stable and patient connected to nasal cannula oxygen Cardiovascular status: blood pressure returned to baseline and stable Postop Assessment: no apparent nausea or vomiting Anesthetic complications: no     Last Vitals:  Vitals:   03/03/20 1254 03/03/20 1425  BP: (!) 144/75 (!) 170/83  Pulse: 93 90  Resp: 18 (!) 22  Temp: 36.7 C 37.8 C  SpO2: 99% 99%    Last Pain:  Vitals:   03/03/20 1455  TempSrc:   PainSc: 0-No pain                 Zanaria Morell

## 2020-03-03 NOTE — Progress Notes (Signed)
PROGRESS NOTE    Clifford [name]  [id number] DOB: 12/29/1950 DOA: 03/02/2020 PCP: [name], FNP   Brief Narrative:  Per HPI: Clifford [name] is a 69 y.o. male with medical history significant for prostate cancer, alcoholism. Patient presented to the ED with complaints of about 1 week of black and tarry stools, dizziness when standing with generalized weakness.  He denies abdominal pain, no vomiting.  He denies NSAID use.  Patient's drinks alcohol, last alcoholic. Last alcoholic beverage was just prior to arrival in the ED.  4/21: Patient was admitted with acute symptomatic blood loss anemia and was noted to be FOBT positive.  He has been started on IV Protonix drip with GI evaluation currently pending.  His hemoglobin levels have improved after 1 unit PRBC transfusion.  He is noted to be hypomagnesemic and will require supplementation with close follow-up.  Assessment & Plan:   Principal Problem:   Symptomatic anemia Active Problems:   Alcoholism ([place])   Essential hypertension   Metabolic acidosis   Alcoholic ketosis   Abnormal LFTs (liver function tests)   GI bleed   Acute symptomatic blood loss anemia-improved -Hemoglobin of 7.8 improved to 9.1 this a.m. after 1 unit PRBC transfusion -Continue on Protonix infusion -Clear liquid diet and evaluation by GI still pending -No further overt bleeding identified and will follow-up CBC in a.m.  Alcoholic ketoacidosis -Continue on CIWA protocol -Continue to follow closely while on IV fluid  Hypomagnesemia and hypokalemia likely secondary to alcohol abuse -Supplement magnesium today and follow-up in a.m. -Follow-up repeat electrolyte panel in a.m.  Hypertension -Currently with borderline elevation -We will consider initiation of antihypertensives if significant elevations noted. -Not currently on home medications  Transaminitis-chronic -Secondary to alcohol abuse, continue following in a.m.  Recent  right-sided rib fractures -Continue pain management  DVT prophylaxis: SCDs Code Status: Full Family Communication: Tried calling spouse Ms. Jeralyn Ruths with no response Disposition Plan: Per GI evaluation with likely need for endoscopy.  Continue close monitoring and anticipate discharge in next 24 hours.   Consultants:   GI  Procedures:   None  Antimicrobials:   None   Subjective: Patient seen and evaluated today with no new acute complaints or concerns. No acute concerns or events noted overnight.  He denies any abdominal pain, nausea, or vomiting.  Objective: Vitals:   03/03/20 0030 03/03/20 0430 03/03/20 0900 03/03/20 1100  BP: (!) 120/57 (!) 149/65 (!) 153/75 (!) 148/70  Pulse: 100 (!) 103 98 95  Resp: 18 18 17  (!) 21  Temp:  (!) 100.4 F (38 C) 98.2 F (36.8 C) 98.9 F (37.2 C)  TempSrc: Oral Oral Temporal Temporal  SpO2: 100% 100% 100% 98%  Weight:      Height:        Intake/Output Summary (Last 24 hours) at 03/03/2020 1206 Last data filed at 03/03/2020 0943 Gross per 24 hour  Intake 750 ml  Output 300 ml  Net 450 ml   Filed Weights   03/02/20 1552 03/02/20 2026  Weight: 63.5 kg 30 kg    Examination:  General exam: Appears calm and comfortable  Respiratory system: Clear to auscultation. Respiratory effort normal. Cardiovascular system: S1 & S2 heard, RRR. No JVD, murmurs, rubs, gallops or clicks. No pedal edema. Gastrointestinal system: Abdomen is nondistended, soft and nontender. No organomegaly or masses felt. Normal bowel sounds heard. Central nervous system: Alert and oriented. No focal neurological deficits. Extremities: Symmetric 5 x 5 power. Skin: No rashes, lesions or ulcers  Psychiatry: Judgement and insight appear normal. Mood & affect appropriate.     Data Reviewed: I have personally reviewed following labs and imaging studies  CBC: Recent Labs  Lab 03/02/20 1627 03/02/20 2121 03/03/20 0450  WBC 8.9  --  7.5  NEUTROABS 5.3   --   --   HGB 7.8* 7.8* 9.1*  HCT 25.6* 25.6* 29.4*  MCV 101.2*  --  98.7  PLT 144*  --  123XX123*   Basic Metabolic Panel: Recent Labs  Lab 03/02/20 1627 03/02/20 2121 03/03/20 0450  NA 133*  --  136  K 3.4*  --  3.7  CL 94*  --  96*  CO2 15*  --  14*  GLUCOSE 98  --  96  BUN 14  --  13  CREATININE 1.76*  --  1.63*  CALCIUM 8.2*  --  8.3*  MG  --  1.1*  --   PHOS  --  3.0  --    GFR: Estimated Creatinine Clearance: 18.1 mL/min (A) (by C-G formula based on SCr of 1.63 mg/dL (H)). Liver Function Tests: Recent Labs  Lab 03/02/20 1627  AST 104*  ALT 40  ALKPHOS 106  BILITOT 1.2  PROT 7.5  ALBUMIN 3.9   Recent Labs  Lab 03/02/20 1637  LIPASE 184*   No results for input(s): AMMONIA in the last 168 hours. Coagulation Profile: Recent Labs  Lab 03/02/20 1627  INR 1.3*   Cardiac Enzymes: No results for input(s): CKTOTAL, CKMB, CKMBINDEX, TROPONINI in the last 168 hours. BNP (last 3 results) No results for input(s): PROBNP in the last 8760 hours. HbA1C: No results for input(s): HGBA1C in the last 72 hours. CBG: No results for input(s): GLUCAP in the last 168 hours. Lipid Profile: No results for input(s): CHOL, HDL, LDLCALC, TRIG, CHOLHDL, LDLDIRECT in the last 72 hours. Thyroid Function Tests: No results for input(s): TSH, T4TOTAL, FREET4, T3FREE, THYROIDAB in the last 72 hours. Anemia Panel: No results for input(s): VITAMINB12, FOLATE, FERRITIN, TIBC, IRON, RETICCTPCT in the last 72 hours. Sepsis Labs: No results for input(s): PROCALCITON, LATICACIDVEN in the last 168 hours.  No results found for this or any previous visit (from the past 240 hour(s)).       Radiology Studies: No results found.      Scheduled Meds: . folic acid  1 mg Oral Daily  . LORazepam  0-4 mg Intravenous Q6H   Followed by  . [START ON 03/04/2020] LORazepam  0-4 mg Intravenous Q12H  . multivitamin with minerals  1 tablet Oral Daily  . thiamine  100 mg Oral Daily   Or  .  thiamine  100 mg Intravenous Daily   Continuous Infusions: . sodium chloride    . dextrose 5 % and 0.9 % NaCl with KCl 20 mEq/L    . magnesium sulfate bolus IVPB    . pantoprozole (PROTONIX) infusion 8 mg/hr (03/03/20 0931)     LOS: 1 day    Time spent: 35 minutes    Clifford Tillman D Manuella Ghazi, DO [facility]  If 7PM-7AM, please contact night-coverage [email or web address] 03/03/2020, 12:06 PM

## 2020-03-03 NOTE — TOC Initial Note (Signed)
Transition of Care [facility]) - Initial/Assessment Note    Patient Details  Name: Clifford [name] MRN: [id number] Date of Birth: 23-Aug-1951  [facility] CM/SW Contact:    Shade Flood, LCSW Phone Number: 03/03/2020, 5:17 PM  Clinical Narrative:                  Pt from home with wife. Received consult to provide information on AODA treatment resources. Pt's wife at bedside at the time of LCSW visit. Pt sleepy from procedure that was done this afternoon. Pt's wife states that pt is independent in ADLs at home. He has PCP and obtains medications as needed. Pt's wife states that pt has not been receptive to AODA treatment in the past but she stated she would take the written resource information and give to patient.  Assigned TOC will be available if additional needs arise.  Expected Discharge Plan: Home/Self Care Barriers to Discharge: Continued Medical Work up   Patient Goals and CMS Choice        Expected Discharge Plan and Services Expected Discharge Plan: Home/Self Care In-house Referral: Clinical Social Work     Living arrangements for the past 2 months: Single Family Home                                      Prior Living Arrangements/Services Living arrangements for the past 2 months: Single Family Home Lives with:: Spouse Patient language and need for interpreter reviewed:: Yes Do you feel safe going back to the place where you live?: Yes      Need for Family Participation in Patient Care: Yes (Comment) Care giver support system in place?: Yes (comment)   Criminal Activity/Legal Involvement Pertinent to Current Situation/Hospitalization: No - Comment as needed  Activities of Daily Living Home Assistive Devices/Equipment: None ADL Screening (condition at time of admission) Patient's cognitive ability adequate to safely complete daily activities?: Yes Is the patient deaf or have difficulty hearing?: No Does the patient have difficulty seeing,  even when wearing glasses/contacts?: No Does the patient have difficulty concentrating, remembering, or making decisions?: No Patient able to express need for assistance with ADLs?: Yes Does the patient have difficulty dressing or bathing?: No Independently performs ADLs?: Yes (appropriate for developmental age) Does the patient have difficulty walking or climbing stairs?: No Weakness of Legs: None Weakness of Arms/Hands: None  Permission Sought/Granted                  Emotional Assessment       Orientation: : Oriented to Self, Oriented to Place, Oriented to Situation Alcohol / Substance Use: Alcohol Use Psych Involvement: No (comment)  Admission diagnosis:  Lower GI bleed [K92.2] Blood alcohol level of 120-199 mg/100 ml [Y90.6] Symptomatic anemia [D64.9] Alcohol-induced acute pancreatitis, unspecified complication status [id number] GI bleed [K92.2] Patient Active Problem List   Diagnosis Date Noted  . Symptomatic anemia 03/02/2020  . GI bleed 03/02/2020  . Hypomagnesemia 12/09/2019  . Normocytic anemia 12/08/2019  . Positive fecal occult blood test 12/08/2019  . Abnormal LFTs (liver function tests) 12/08/2019  . Alcoholic ketosis 99991111  . Hyponatremia   . Weakness 04/18/2019  . Metabolic acidosis [id number]  . AKI (acute kidney injury) ([place]) 04/18/2019  . Bleeding hemorrhoid   . Thrombocytopenia ([place]) 04/08/2018  . Malignant neoplasm of prostate ([place]) 12/04/2016  . Anemia in other chronic diseases classified elsewhere 07/11/2016  . Alcoholism (  [place]) 07/04/2016  . Seizures ([place]) 07/04/2016  . Hyperlipidemia 07/04/2016  . Essential hypertension 07/04/2016   PCP:  [name], FNP Pharmacy:   [facility] [address] [place] - [address] [place] [address] [place] [address] Phone: [phone] Fax: [phone]     Social Determinants of Health (SDOH) Interventions    Readmission Risk Interventions Readmission Risk  Prevention Plan 04/20/2019  Transportation Screening Complete  PCP or Specialist Appt within 5-7 Days Complete  Home Care Screening Complete  Medication Review (RN CM) Complete  Some recent data might be hidden

## 2020-03-03 NOTE — Op Note (Addendum)
[facility] Patient Name: Clifford [name] Procedure Date: 03/03/2020 2:12 PM MRN: [id number] Date of Birth: [birth year] Attending MD: Hildred Laser , MD CSN: [id number] Age: 69 Admit Type: Inpatient Procedure:                Upper GI endoscopy Indications:              [place] Providers:                Hildred Laser, MD, Jeanann Lewandowsky. Sharon Seller, RN, Nelma Rothman, Technician Referring MD:             Heath Lark, DO Medicines:                Propofol per Anesthesia Complications:            No immediate complications. Estimated Blood Loss:     Estimated blood loss was minimal. Procedure:                Pre-Anesthesia Assessment:                           - Prior to the procedure, a History and Physical                            was performed, and patient medications and                            allergies were reviewed. The patient's tolerance of                            previous anesthesia was also reviewed. The risks                            and benefits of the procedure and the sedation                            options and risks were discussed with the patient.                            All questions were answered, and informed consent                            was obtained. Prior Anticoagulants: The patient has                            taken no previous anticoagulant or antiplatelet                            agents. ASA Grade Assessment: III - A patient with                            severe systemic disease. After reviewing the risks  and benefits, the patient was deemed in                            satisfactory condition to undergo the procedure.                           After obtaining informed consent, the endoscope was                            passed under direct vision. Throughout the                            procedure, the patient's blood pressure, pulse, and                            oxygen saturations were  monitored continuously. The                            [id number]) scope was introduced through the                            mouth, and advanced to the second part of duodenum.                            The upper GI endoscopy was accomplished without                            difficulty. The patient tolerated the procedure                            well. Scope In: 2:59:35 PM Scope Out: 3:07:56 PM Total Procedure Duration: 0 hours 8 minutes 21 seconds  Findings:      The examined esophagus was normal.      The Z-line was irregular and was found 37 cm from the incisors.      A 2 cm hiatal hernia was present.      Patchy mild inflammation characterized by congestion (edema) and       erythema was found in the gastric fundus, in the gastric body and in the       gastric antrum. Biopsies were taken with a cold forceps for histology.      Four non-bleeding cratered and superficial gastric ulcers with no       stigmata of bleeding were found in the gastric body and in the gastric       antrum. The largest lesion was 6 mm in largest dimension.      The duodenal bulb and second portion of the duodenum were normal. Impression:               - Normal esophagus.                           - Z-line irregular, 37 cm from the incisors.                           - 2 cm hiatal hernia.                           -  Alcoholic gastritis. Biopsied.                           - Non-bleeding gastric ulcers with no stigmata of                            bleeding.                           - Normal duodenal bulb and second portion of the                            duodenum. Moderate Sedation:      Per Anesthesia Care Recommendation:           - Return patient to hospital ward for ongoing care.                           - Low sodium diet today.                           - Continue present medications.                           - Vit K 10 mg SQ x 1.                           - No aspirin, ibuprofen,  naproxen, or other                            non-steroidal anti-inflammatory drugs.                           - Await pathology results.                           - Repeat upper endoscopy in 3 months to check                            healing. Procedure Code(s):        --- Professional ---                           [phone], Esophagogastroduodenoscopy, flexible,                            transoral; with biopsy, single or multiple Diagnosis Code(s):        --- Professional ---                           K22.8, Other specified diseases of esophagus                           K44.9, Diaphragmatic hernia without obstruction or                            gangrene  123XX123, Alcoholic gastritis without bleeding                           K25.9, Gastric ulcer, unspecified as acute or                            chronic, without hemorrhage or perforation                           K92.1, Melena (includes Hematochezia) CPT copyright 2019 American Medical Association. All rights reserved. The codes documented in this report are preliminary and upon coder review may  be revised to meet current compliance requirements. Hildred Laser, MD Hildred Laser, MD 03/03/2020 3:18:30 PM This report has been signed electronically. Number of Addenda: 0

## 2020-03-03 NOTE — Transfer of Care (Signed)
Immediate Anesthesia Transfer of Care Note  Patient: Clifford [name]  Procedure(s) Performed: ESOPHAGOGASTRODUODENOSCOPY (EGD) WITH PROPOFOL (N/A ) BIOPSY  Patient Location: PACU  Anesthesia Type:MAC  Level of Consciousness: awake, alert , oriented and patient cooperative  Airway & Oxygen Therapy: Patient Spontanous Breathing and Patient connected to nasal cannula oxygen  Post-op Assessment: Report given to RN and Post -op Vital signs reviewed and stable  Post vital signs: Reviewed and stable  Last Vitals:  Vitals Value Taken Time  BP 101/64 03/03/20 1513  Temp    Pulse 95 03/03/20 1514  Resp 24 03/03/20 1514  SpO2 85 % 03/03/20 1514  Vitals shown include unvalidated device data.  Last Pain:  Vitals:   03/03/20 1455  TempSrc:   PainSc: 0-No pain      Patients Stated Pain Goal: 8 (22/84/06 9861)  Complications: No apparent anesthesia complications

## 2020-03-03 NOTE — Progress Notes (Signed)
Brief EGD note.  Normal mucosa of the esophagus. 2 cm sliding hiatal hernia. Gastritis involving fundus body and antrum.  Biopsy taken. 4 small gastric ulcers without stigmata of bleed.  Of these 1 was located in gastric body measuring 4 x 6 mm and the other ulcers were at antrum and were smaller in size. Normal bulbar and post bulbar mucosa.

## 2020-03-04 ENCOUNTER — Inpatient Hospital Stay (HOSPITAL_COMMUNITY): Payer: Medicare Other

## 2020-03-04 DIAGNOSIS — D649 Anemia, unspecified: Secondary | ICD-10-CM

## 2020-03-04 DIAGNOSIS — K922 Gastrointestinal hemorrhage, unspecified: Secondary | ICD-10-CM

## 2020-03-04 DIAGNOSIS — R945 Abnormal results of liver function studies: Secondary | ICD-10-CM

## 2020-03-04 LAB — URINALYSIS, ROUTINE W REFLEX MICROSCOPIC
Bacteria, UA: NONE SEEN
Bilirubin Urine: NEGATIVE
Glucose, UA: NEGATIVE mg/dL
Ketones, ur: NEGATIVE mg/dL
Leukocytes,Ua: NEGATIVE
Nitrite: NEGATIVE
Protein, ur: 30 mg/dL — AB
Specific Gravity, Urine: 1.015 (ref 1.005–1.030)
pH: 5 (ref 5.0–8.0)

## 2020-03-04 LAB — CBC
HCT: 30.2 % — ABNORMAL LOW (ref 39.0–52.0)
Hemoglobin: 9.6 g/dL — ABNORMAL LOW (ref 13.0–17.0)
MCH: 30.2 pg (ref 26.0–34.0)
MCHC: 31.8 g/dL (ref 30.0–36.0)
MCV: 95 fL (ref 80.0–100.0)
Platelets: 107 10*3/uL — ABNORMAL LOW (ref 150–400)
RBC: 3.18 MIL/uL — ABNORMAL LOW (ref 4.22–5.81)
RDW: 15 % (ref 11.5–15.5)
WBC: 5.4 10*3/uL (ref 4.0–10.5)
nRBC: 0 % (ref 0.0–0.2)

## 2020-03-04 LAB — COMPREHENSIVE METABOLIC PANEL
ALT: 29 U/L (ref 0–44)
AST: 61 U/L — ABNORMAL HIGH (ref 15–41)
Albumin: 3.1 g/dL — ABNORMAL LOW (ref 3.5–5.0)
Alkaline Phosphatase: 82 U/L (ref 38–126)
Anion gap: 13 (ref 5–15)
BUN: 8 mg/dL (ref 8–23)
CO2: 24 mmol/L (ref 22–32)
Calcium: 8.1 mg/dL — ABNORMAL LOW (ref 8.9–10.3)
Chloride: 103 mmol/L (ref 98–111)
Creatinine, Ser: 1.18 mg/dL (ref 0.61–1.24)
GFR calc Af Amer: >60 mL/min (ref >60)
GFR calc non Af Amer: >60 mL/min (ref >60)
Glucose, Bld: 156 mg/dL — ABNORMAL HIGH (ref 70–99)
Potassium: 3.3 mmol/L — ABNORMAL LOW (ref 3.5–5.1)
Sodium: 140 mmol/L (ref 135–145)
Total Bilirubin: 1.4 mg/dL — ABNORMAL HIGH (ref 0.3–1.2)
Total Protein: 6.3 g/dL — ABNORMAL LOW (ref 6.5–8.1)

## 2020-03-04 LAB — LIPASE, BLOOD: Lipase: 117 U/L — ABNORMAL HIGH (ref 11–51)

## 2020-03-04 LAB — PROCALCITONIN: Procalcitonin: 0.17 ng/mL

## 2020-03-04 LAB — C-REACTIVE PROTEIN: CRP: 2.6 mg/dL — ABNORMAL HIGH (ref <1.0)

## 2020-03-04 LAB — MAGNESIUM: Magnesium: 1.2 mg/dL — ABNORMAL LOW (ref 1.7–2.4)

## 2020-03-04 LAB — SEDIMENTATION RATE: Sed Rate: 25 mm/hr — ABNORMAL HIGH (ref 0–16)

## 2020-03-04 MED ORDER — PANTOPRAZOLE SODIUM 40 MG IV SOLR
40.0000 mg | Freq: Two times a day (BID) | INTRAVENOUS | Status: DC
  Administered 2020-03-04 – 2020-03-05 (×2): 40 mg via INTRAVENOUS
  Filled 2020-03-04 (×2): qty 40

## 2020-03-04 MED ORDER — POTASSIUM CHLORIDE CRYS ER 20 MEQ PO TBCR
40.0000 meq | EXTENDED_RELEASE_TABLET | Freq: Two times a day (BID) | ORAL | Status: AC
  Administered 2020-03-04 (×2): 40 meq via ORAL
  Filled 2020-03-04 (×2): qty 2

## 2020-03-04 MED ORDER — MAGNESIUM SULFATE 2 GM/50ML IV SOLN
2.0000 g | Freq: Once | INTRAVENOUS | Status: AC
  Administered 2020-03-04: 09:00:00 2 g via INTRAVENOUS
  Filled 2020-03-04: qty 50

## 2020-03-04 NOTE — Evaluation (Signed)
Physical Therapy Evaluation Patient Details Name: Clifford [name] MRN: [id number] DOB: [birth year] Today's Date: 03/04/2020   History of Present Illness  Clifford [name] is a 68 y.o. male with medical history significant for prostate cancer, alcoholism.Patient presented to the ED with complaints of about 1 week of black and tarry stools, dizziness when standing with generalized weakness.  He denies abdominal pain, no vomiting.  He denies NSAID use.    Clinical Impression  Patient functioning near baseline for functional mobility and gait, demonstrates slightly labored movement for sitting up at bedside, unsteady on feet requiring hand held assist, used RW for ambulation without loss of balance, limited secondary to fatigue and put back to bed with family members present in room.  Patient will benefit from continued physical therapy in hospital and recommended venue below to increase strength, balance, endurance for safe ADLs and gait.     Follow Up Recommendations Home health PT;Supervision for mobility/OOB;Supervision - Intermittent    Equipment Recommendations  None recommended by PT    Recommendations for Other Services       Precautions / Restrictions Precautions Precautions: Fall Restrictions Weight Bearing Restrictions: No      Mobility  Bed Mobility Overal bed mobility: Needs Assistance Bed Mobility: Supine to Sit     Supine to sit: Min guard;Min assist     General bed mobility comments: increased time, labored movement  Transfers Overall transfer level: Needs assistance Equipment used: Rolling walker (2 wheeled);None Transfers: Sit to/from [employer] to Stand: Supervision;Min guard Stand pivot transfers: Supervision;Min guard       General transfer comment: unsteady on feet having to lean on nearby objects for support without AD, safer using RW  Ambulation/Gait Ambulation/Gait assistance: Min guard;Min assist Gait Distance (Feet): 100  Feet Assistive device: Rolling walker (2 wheeled) Gait Pattern/deviations: Decreased step length - left;Decreased stance time - right;Decreased stride length Gait velocity: decreased   General Gait Details: slightly labored cadence without loss of balance, limited secondary to fatigue  Stairs            Wheelchair Mobility    Modified Rankin (Stroke Patients Only)       Balance Overall balance assessment: Needs assistance Sitting-balance support: Feet supported;No upper extremity supported Sitting balance-Leahy Scale: Good Sitting balance - Comments: seated at EOB   Standing balance support: During functional activity;No upper extremity supported Standing balance-Leahy Scale: Poor Standing balance comment: fair/good using RW                             Pertinent Vitals/Pain Pain Assessment: No/denies pain    Home Living Family/patient expects to be discharged to:: Private residence Living Arrangements: Spouse/significant other Available Help at Discharge: Family;Available 24 hours/day Type of Home: House Home Access: Stairs to enter Entrance Stairs-Rails: Right;Left;Can reach both Entrance Stairs-Number of Steps: 4-5 Home Layout: Able to live on main level with bedroom/bathroom;Laundry or work area in basement;Two level Home Equipment: Walker - 2 wheels;Cane - single point;Bedside commode;Wheelchair - Brewing technologist      Prior Function Level of Independence: Independent         Comments: Hydrographic surveyor, drives     Journalist, newspaper        Extremity/Trunk Assessment   Upper Extremity Assessment Upper Extremity Assessment: Generalized weakness    Lower Extremity Assessment Lower Extremity Assessment: Generalized weakness    Cervical / Trunk Assessment Cervical / Trunk Assessment: Normal  Communication   Communication:  No difficulties  Cognition Arousal/Alertness: Awake/alert Behavior During Therapy: WFL for tasks  assessed/performed Overall Cognitive Status: Within Functional Limits for tasks assessed                                        General Comments      Exercises     Assessment/Plan    PT Assessment Patient needs continued PT services  PT Problem List Decreased strength;Decreased activity tolerance;Decreased balance;Decreased mobility       PT Treatment Interventions Balance training;Gait training;Stair training;Functional mobility training;Therapeutic activities;Therapeutic exercise;Patient/family education    PT Goals (Current goals can be found in the Care Plan section)  Acute Rehab PT Goals Patient Stated Goal: return home with family to assist PT Goal Formulation: With patient/family Time For Goal Achievement: 03/08/20 Potential to Achieve Goals: Good    Frequency Min 3X/week   Barriers to discharge        Co-evaluation               AM-PAC PT "6 Clicks" Mobility  Outcome Measure Help needed turning from your back to your side while in a flat bed without using bedrails?: None Help needed moving from lying on your back to sitting on the side of a flat bed without using bedrails?: A Little Help needed moving to and from a bed to a chair (including a wheelchair)?: A Little Help needed standing up from a chair using your arms (e.g., wheelchair or bedside chair)?: A Little Help needed to walk in hospital room?: A Little Help needed climbing 3-5 steps with a railing? : A Little 6 Click Score: 19    End of Session   Activity Tolerance: Patient tolerated treatment well;Patient limited by fatigue Patient left: in bed;with call bell/phone within reach;with family/visitor present Nurse Communication: Mobility status PT Visit Diagnosis: Unsteadiness on feet (R26.81);Other abnormalities of gait and mobility (R26.89);Muscle weakness (generalized) (M62.81)    Time: [id number] PT Time Calculation (min) (ACUTE ONLY): 28 min   Charges:   PT  Evaluation $PT Eval Moderate Complexity: 1 Mod PT Treatments $Therapeutic Activity: 23-37 mins        2:56 PM, 03/04/20 Lonell Grandchild, MPT Physical Therapist with [facility] 336 [phone] office [phone] mobile phone

## 2020-03-04 NOTE — Plan of Care (Signed)
  Problem: Acute Rehab PT Goals(only PT should resolve) Goal: Pt Will Go Supine/Side To Sit Outcome: Progressing Flowsheets (Taken 03/04/2020 1457) Pt will go Supine/Side to Sit: with modified independence Goal: Patient Will Transfer Sit To/From Stand Outcome: Progressing Flowsheets (Taken 03/04/2020 1457) Patient will transfer sit to/from stand: with supervision Goal: Pt Will Transfer Bed To Chair/Chair To Bed Outcome: Progressing Flowsheets (Taken 03/04/2020 1457) Pt will Transfer Bed to Chair/Chair to Bed: with supervision Goal: Pt Will Ambulate Outcome: Progressing Flowsheets (Taken 03/04/2020 1457) Pt will Ambulate:  > 125 feet  with supervision  with rolling walker   2:57 PM, 03/04/20 Lonell Grandchild, MPT Physical Therapist with [facility] [phone] office [phone] mobile phone

## 2020-03-04 NOTE — Progress Notes (Addendum)
Subjective:  "little belly pain when I cough". "not all the time". Tolerating diet. No N/V.   Objective: Vital signs in last 24 hours: Temp:  [98 F (36.7 C)-101.3 F (38.5 C)] 100.6 F (38.1 C) (04/22 0612) Pulse Rate:  [77-95] 82 (04/22 0612) Resp:  [16-24] 20 (04/22 0612) BP: (99-170)/(71-83) 153/82 (04/22 0612) SpO2:  [96 %-100 %] 100 % (04/22 0612) Last BM Date: 03/01/20 General:   Alert,  Well-developed, well-nourished, pleasant and cooperative in NAD Head:  Normocephalic and atraumatic. Eyes:  Sclera clear, no icterus.  Abdomen:  Soft, nontender and nondistended. Normal bowel sounds, without guarding, and without rebound.   Extremities:  Without clubbing, deformity or edema. Neurologic:  Alert and  oriented x4;  grossly normal neurologically. Skin:  Intact without significant lesions or rashes. Psych:  Alert and cooperative. Normal mood and affect.  Intake/Output from previous day: 04/21 0701 - 04/22 0700 In: 1078.4 [P.O.:120; I.V.:908.4; IV Piggyback:50] Out: [id number] [Urine:1750; Blood:2] Intake/Output this shift: Total I/O In: 315 [Blood:315] Out: -   Lab Results: CBC Recent Labs    03/02/20 1627 03/02/20 1627 03/02/20 2121 03/03/20 0450 03/04/20 0443  WBC 8.9  --   --  7.5 5.4  HGB 7.8*   < > 7.8* 9.1* 9.6*  HCT 25.6*   < > 25.6* 29.4* 30.2*  MCV 101.2*  --   --  98.7 95.0  PLT 144*  --   --  126* 107*   < > = values in this interval not displayed.   BMET Recent Labs    03/02/20 1627 03/03/20 0450 03/04/20 0443  NA 133* 136 140  K 3.4* 3.7 3.3*  CL 94* 96* 103  CO2 15* 14* 24  GLUCOSE 98 96 156*  BUN 14 13 8   CREATININE 1.76* 1.63* 1.18  CALCIUM 8.2* 8.3* 8.1*   LFTs Recent Labs    03/02/20 1627 03/04/20 0443  BILITOT 1.2 1.4*  ALKPHOS 106 82  AST 104* 61*  ALT 40 29  PROT 7.5 6.3*  ALBUMIN 3.9 3.1*   Recent Labs    03/02/20 1637  LIPASE 184*   PT/INR Recent Labs    03/02/20 1627  LABPROT 15.7*  INR 1.3*      Imaging  Studies: DG Ribs Unilateral W/Chest Right  Result Date: 02/07/2020 CLINICAL DATA:  Right rib pain secondary to a fall last night. EXAM: RIGHT RIBS AND CHEST - 3+ VIEW COMPARISON:  08/13/2018 FINDINGS: There are fractures of the anterolateral aspects of the right ninth and tenth ribs. The other ribs are intact. Heart size and vascularity are normal. Lungs are clear. No pneumothorax or pleural effusion or lung contusion. No other bone abnormality. IMPRESSION: Fractures of the anterolateral aspects of the right ninth and tenth ribs. Electronically Signed   By: Lorriane Shire M.D.   On: 02/07/2020 16:35  [2 weeks]   Assessment: Pleasant 69 year old gentleman presenting to the hospital with several days of melena, weakness.  Upper GI bleed: Several days of melena.  Baseline hemoglobin in the 10 range.  At time of presentation hemoglobin 7.8. received 1 units of packed red blood cells.  Current hemoglobin 9.6.  EGD yesterday showed normal esophagus, 2 cm sliding hiatal hernia, gastritis, 4 small gastric ulcers without stigmata of bleeding.  1 located in the gastric body measuring 4 x 6 mm, the others were in the antrum and much smaller. Spoke to nursing, no black stools. Small formed brown BM this morning.   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 ALT improving.  No stigmata of cirrhosis on EGD. Fatty liver noted on CT 2019. He does have thrombocytopenia.  Fever: Tmax last 24 hours of 101.3, per attending. Patient had formed BM early this morning, brown. He has had single loose stool since then. Chart flagged for checking Cdiff. Spoke with nurse. Will hold off unless subsequent loose stools.    Elevated lipase on admission. Fever. No imaging this admission. Repeat lipase improved,117. Clinically abdominal exam is benign. WBC normal.    Plan: 1. Follow-up path. 2. Repeat upper endoscopy in 3 months to verify ulcer healing. 3. No ASA, NSAIDS. 4. Transition PPI drip to BID 5. Work up of fever, per attending.    Laureen Ochs. Bernarda Caffey [facility] [phone] 4/22/202111:37 AM     LOS: 2 days

## 2020-03-04 NOTE — TOC Progression Note (Signed)
Transition of Care [facility]) - Progression Note    Patient Details  Name: LOR LAMERE MRN: [id number] Date of Birth: April 19, 1951  [facility] CM/SW Contact  Boneta Lucks, RN Phone Number: 03/04/2020, 2:28 PM  Clinical Narrative:   PT is recommending HHPT,  Wife and son at the bedside agrees and wants referral made to [place]. [place] accepted the referral.     Expected Discharge Plan: [place] Barriers to Discharge: Continued Medical Work up  Expected Discharge Plan and Services Expected Discharge Plan: [place] In-house Referral: Clinical Social Work     Living arrangements for the past 2 months: Single Family Home                   Readmission Risk Interventions Readmission Risk Prevention Plan 04/20/2019  Transportation Screening Complete  PCP or Specialist Appt within 5-7 Days Complete  Home Care Screening Complete  Medication Review (RN CM) Complete  Some recent data might be hidden

## 2020-03-04 NOTE — Progress Notes (Signed)
PROGRESS NOTE    Clifford [name]  AST:[id number] DOB: Sep 13, 1951 DOA: 03/02/2020 PCP: [name], FNP   Brief Narrative:  Per HPI: Clifford [name] a 69 y.o.malewith medical history significant forprostate cancer, alcoholism. Patient presented to the ED with complaints of about 1 week of black and tarry stools, dizziness when standingwithgeneralized weakness. He denies abdominal pain, no vomiting. He denies NSAID use.  Patient's drinks alcohol, last alcoholic.Lastalcoholic beverage was just prior to arrival in the ED.  4/21: Patient was admitted with acute symptomatic blood loss anemia and was noted to be FOBT positive.  He has been started on IV Protonix drip with GI evaluation currently pending.  His hemoglobin levels have improved after 1 unit PRBC transfusion.  He is noted to be hypomagnesemic and will require supplementation with close follow-up.  4/22: Patient continues have stable hemoglobin levels, but he is febrile and will require further evaluation with chest x-ray as well as urinalysis and blood cultures that have now been ordered.  Plans to transition to PPI twice daily per gastroenterology recommendations.  PT evaluation ordered.  Anticipate discharge in the next 24-48 hours pending stability.  Assessment & Plan:   Principal Problem:   Symptomatic anemia Active Problems:   Alcoholism ([place])   Essential hypertension   Metabolic acidosis   Alcoholic ketosis   Abnormal LFTs (liver function tests)   UGI bleed   Acute symptomatic blood loss anemia-improved and stable status post 1 unit PRBC transfusion -Hemoglobin at 9.6, follow CBC in a.m. -Continue on Protonix infusion and plan to downgrade to twice daily per GI recommendations -Continue current diet -No further overt bleeding identified and will follow-up CBC in a.m.  Low-grade fever -Ordered blood cultures, chest x-ray, and urine analysis -ESR and CRP as well as procalcitonin ordered -Blood  cultures ordered and pending -Possible concern for C. difficile and may need evaluation in this regard as well, per GI  Alcoholic ketoacidosis-resolved -Continue on CIWA protocol  Hypomagnesemia and hypokalemia likely secondary to alcohol abuse -Supplement magnesium and potassium today and follow-up in a.m. -Follow-up repeat electrolyte panel in a.m.  Hypertension -Currently with borderline elevation -We will consider initiation of antihypertensives if significant elevations noted. -Not currently on home medications  Transaminitis-chronic, but downtrending -Secondary to alcohol abuse, continue following in a.m.  Recent right-sided rib fractures -Continue pain management  Left arm swelling with prior VATS procedure -Asymptomatic and can follow-up with CV surgery in outpatient setting as needed  DVT prophylaxis: SCDs Code Status: Full Family Communication:  Disposition Plan:  Evaluation of fever with chest x-ray, blood cultures and urine analysis ordered.  Per GI evaluation.  Anticipate discharge in the next 24-48 hours if improved.   Consultants:   GI  Procedures:   None  Antimicrobials:   None  Subjective: Patient seen and evaluated today with no new acute complaints or concerns. No acute concerns or events noted overnight.  He is tolerating diet with no nausea or vomiting noted.  He is having some achiness with his cough due to his rib fractures.  Objective: Vitals:   03/03/20 1912 03/03/20 2137 03/03/20 2357 03/04/20 0612  BP: (!) 153/80  (!) 156/76 (!) 153/82  Pulse: 89  77 82  Resp: '20  16 20  '$ Temp: (!) 101.3 F (38.5 C)  99.7 F (37.6 C) (!) 100.6 F (38.1 C)  TempSrc: Oral  Oral Oral  SpO2: 100% 96% 100% 100%  Weight:      Height:  Intake/Output Summary (Last 24 hours) at 03/04/2020 1156 Last data filed at 03/04/2020 1110 Gross per 24 hour  Intake 1393.39 ml  Output 1802 ml  Net -408.61 ml   Filed Weights   03/02/20 1552  03/02/20 2026  Weight: 63.5 kg 30 kg    Examination:  General exam: Appears calm and comfortable  Respiratory system: Clear to auscultation. Respiratory effort normal. Cardiovascular system: S1 & S2 heard, RRR. No JVD, murmurs, rubs, gallops or clicks. No pedal edema. Gastrointestinal system: Abdomen is nondistended, soft and nontender. No organomegaly or masses felt. Normal bowel sounds heard. Central nervous system: Alert and oriented. No focal neurological deficits. Extremities: Symmetric 5 x 5 power. Skin: No rashes, lesions or ulcers Psychiatry: Judgement and insight appear normal. Mood & affect appropriate.     Data Reviewed: I have personally reviewed following labs and imaging studies  CBC: Recent Labs  Lab 03/02/20 1627 03/02/20 2121 03/03/20 0450 03/04/20 0443  WBC 8.9  --  7.5 5.4  NEUTROABS 5.3  --   --   --   HGB 7.8* 7.8* 9.1* 9.6*  HCT 25.6* 25.6* 29.4* 30.2*  MCV 101.2*  --  98.7 95.0  PLT 144*  --  126* 502*   Basic Metabolic Panel: Recent Labs  Lab 03/02/20 1627 03/02/20 2121 03/03/20 0450 03/04/20 0443  NA 133*  --  136 140  K 3.4*  --  3.7 3.3*  CL 94*  --  96* 103  CO2 15*  --  14* 24  GLUCOSE 98  --  96 156*  BUN 14  --  13 8  CREATININE 1.76*  --  1.63* 1.18  CALCIUM 8.2*  --  8.3* 8.1*  MG  --  1.1*  --  1.2*  PHOS  --  3.0  --   --    GFR: Estimated Creatinine Clearance: 25.1 mL/min (by C-G formula based on SCr of 1.18 mg/dL). Liver Function Tests: Recent Labs  Lab 03/02/20 1627 03/04/20 0443  AST 104* 61*  ALT 40 29  ALKPHOS 106 82  BILITOT 1.2 1.4*  PROT 7.5 6.3*  ALBUMIN 3.9 3.1*   Recent Labs  Lab 03/02/20 1637 03/04/20 0443  LIPASE 184* 117*   No results for input(s): AMMONIA in the last 168 hours. Coagulation Profile: Recent Labs  Lab 03/02/20 1627  INR 1.3*   Cardiac Enzymes: No results for input(s): CKTOTAL, CKMB, CKMBINDEX, TROPONINI in the last 168 hours. BNP (last 3 results) No results for input(s):  PROBNP in the last 8760 hours. HbA1C: No results for input(s): HGBA1C in the last 72 hours. CBG: No results for input(s): GLUCAP in the last 168 hours. Lipid Profile: No results for input(s): CHOL, HDL, LDLCALC, TRIG, CHOLHDL, LDLDIRECT in the last 72 hours. Thyroid Function Tests: No results for input(s): TSH, T4TOTAL, FREET4, T3FREE, THYROIDAB in the last 72 hours. Anemia Panel: No results for input(s): VITAMINB12, FOLATE, FERRITIN, TIBC, IRON, RETICCTPCT in the last 72 hours. Sepsis Labs: No results for input(s): PROCALCITON, LATICACIDVEN in the last 168 hours.  Recent Results (from the past 240 hour(s))  SARS CORONAVIRUS 2 (TAT 6-24 HRS) Nasopharyngeal Nasopharyngeal Swab     Status: None   Collection Time: 03/02/20  6:46 PM   Specimen: Nasopharyngeal Swab  Result Value Ref Range Status   SARS Coronavirus 2 NEGATIVE NEGATIVE Final    Comment: (NOTE) SARS-CoV-2 target nucleic acids are NOT DETECTED. The SARS-CoV-2 RNA is generally detectable in upper and lower respiratory specimens during the acute phase of  infection. Negative results do not preclude SARS-CoV-2 infection, do not rule out co-infections with other pathogens, and should not be used as the sole basis for treatment or other patient management decisions. Negative results must be combined with clinical observations, patient history, and epidemiological information. The expected result is Negative. Fact Sheet for Patients: SugarRoll.be Fact Sheet for Healthcare Providers: https://www.woods-mathews.com/ This test is not yet approved or cleared by the [place] FDA and  has been authorized for detection and/or diagnosis of SARS-CoV-2 by FDA under an Emergency Use Authorization (EUA). This EUA will remain  in effect (meaning this test can be used) for the duration of the COVID-19 declaration under Section 56 4(b)(1) of the Act, 21 U.S.C. section 360bbb-3(b)(1), unless the  authorization is terminated or revoked sooner. Performed at [facility], [place] [address] [place], [place] [address]          Radiology Studies: No results found.      Scheduled Meds: . folic acid  1 mg Oral Daily  . LORazepam  0-4 mg Intravenous Q6H   Followed by  . LORazepam  0-4 mg Intravenous Q12H  . multivitamin with minerals  1 tablet Oral Daily  . potassium chloride  40 mEq Oral BID  . thiamine  100 mg Oral Daily   Or  . thiamine  100 mg Intravenous Daily   Continuous Infusions: . sodium chloride    . pantoprozole (PROTONIX) infusion 8 mg/hr (03/04/20 0926)     LOS: 2 days    Time spent: 35 minutes    Sheela Mcculley D Manuella Ghazi, DO [facility]  If 7PM-7AM, please contact night-coverage [email or web address] 03/04/2020, 11:56 AM

## 2020-03-05 ENCOUNTER — Other Ambulatory Visit: Payer: Self-pay

## 2020-03-05 LAB — CBC
HCT: 29.2 % — ABNORMAL LOW (ref 39.0–52.0)
Hemoglobin: 9.2 g/dL — ABNORMAL LOW (ref 13.0–17.0)
MCH: 30.3 pg (ref 26.0–34.0)
MCHC: 31.5 g/dL (ref 30.0–36.0)
MCV: 96.1 fL (ref 80.0–100.0)
Platelets: 115 10*3/uL — ABNORMAL LOW (ref 150–400)
RBC: 3.04 MIL/uL — ABNORMAL LOW (ref 4.22–5.81)
RDW: 15.2 % (ref 11.5–15.5)
WBC: 6.1 10*3/uL (ref 4.0–10.5)
nRBC: 0 % (ref 0.0–0.2)

## 2020-03-05 LAB — COMPREHENSIVE METABOLIC PANEL
ALT: 29 U/L (ref 0–44)
AST: 70 U/L — ABNORMAL HIGH (ref 15–41)
Albumin: 3.2 g/dL — ABNORMAL LOW (ref 3.5–5.0)
Alkaline Phosphatase: 84 U/L (ref 38–126)
Anion gap: 11 (ref 5–15)
BUN: 6 mg/dL — ABNORMAL LOW (ref 8–23)
CO2: 23 mmol/L (ref 22–32)
Calcium: 8.4 mg/dL — ABNORMAL LOW (ref 8.9–10.3)
Chloride: 104 mmol/L (ref 98–111)
Creatinine, Ser: 1.29 mg/dL — ABNORMAL HIGH (ref 0.61–1.24)
GFR calc Af Amer: >60 mL/min (ref >60)
GFR calc non Af Amer: 56 mL/min — ABNORMAL LOW (ref >60)
Glucose, Bld: 122 mg/dL — ABNORMAL HIGH (ref 70–99)
Potassium: 4.8 mmol/L (ref 3.5–5.1)
Sodium: 138 mmol/L (ref 135–145)
Total Bilirubin: 1.2 mg/dL (ref 0.3–1.2)
Total Protein: 6.5 g/dL (ref 6.5–8.1)

## 2020-03-05 LAB — SEDIMENTATION RATE: Sed Rate: 22 mm/hr — ABNORMAL HIGH (ref 0–16)

## 2020-03-05 LAB — MAGNESIUM: Magnesium: 1.6 mg/dL — ABNORMAL LOW (ref 1.7–2.4)

## 2020-03-05 LAB — C-REACTIVE PROTEIN: CRP: 2.5 mg/dL — ABNORMAL HIGH (ref <1.0)

## 2020-03-05 LAB — SURGICAL PATHOLOGY

## 2020-03-05 MED ORDER — PANTOPRAZOLE SODIUM 40 MG PO TBEC: DELAYED_RELEASE_TABLET | ORAL | 2 refills | Status: AC

## 2020-03-05 MED ORDER — MAGNESIUM OXIDE 400 MG PO CAPS: 400.0000 mg | ORAL_CAPSULE | Freq: Every day | ORAL | 0 refills | Status: DC

## 2020-03-05 MED ORDER — MAGNESIUM SULFATE 2 GM/50ML IV SOLN
2.0000 g | Freq: Once | INTRAVENOUS | Status: AC
  Administered 2020-03-05: 2 g via INTRAVENOUS
  Filled 2020-03-05: qty 50

## 2020-03-05 MED ORDER — FOLIC ACID 1 MG PO TABS: 1.0000 mg | ORAL_TABLET | Freq: Every day | ORAL | 2 refills | Status: AC

## 2020-03-05 MED ORDER — THIAMINE HCL 100 MG PO TABS: 100.0000 mg | ORAL_TABLET | Freq: Every day | ORAL | Status: DC

## 2020-03-05 MED ORDER — THIAMINE HCL 100 MG PO TABS: 100.0000 mg | ORAL_TABLET | Freq: Every day | ORAL | 2 refills | Status: AC

## 2020-03-05 NOTE — Progress Notes (Signed)
Showed no signs of alcohol withdrawal.  IV removed and discharge instructions reviewed with patient and wife. To have pt at home and follow up with primary and Dr. Laural Golden.  Scripts sent to pharmacy.  Transported by WC to car. Wife to drive home

## 2020-03-05 NOTE — Discharge Summary (Signed)
Physician Discharge Summary  Clifford [name] [id number] DOB: 08-Jun-1951 DOA: 03/02/2020  PCP: [name], FNP  Admit date: 03/02/2020  Discharge date: 03/05/2020  Admitted From:Home  Disposition:  Home  Recommendations for Outpatient Follow-up:  1. Follow up with PCP in 1-2 weeks and repeat BMP as well as magnesium level 2. Continue on Protonix as prescribed twice daily and then once daily after 30 days 3. Discussed alcohol cessation which patient claims that he will work on at home 4. Follow-up with GI in 3 months for repeat endoscopy to verify ulcer healing 5. Patient prescribed magnesium oxide to take on a daily basis due to persistent low magnesium levels  Home Health: Yes with PT  Equipment/Devices: None  Discharge Condition: Stable  CODE STATUS: Full  Diet recommendation: Heart Healthy  Brief/Interim Summary: Per HPI: Clifford [name] a 69 y.o.malewith medical history significant forprostate cancer, alcoholism. Patient presented to the ED with complaints of about 1 week of black and tarry stools, dizziness when standingwithgeneralized weakness. He denies abdominal pain, no vomiting. He denies NSAID use.  Patient's drinks alcohol, last alcoholic.Lastalcoholic beverage was just prior to arrival in the ED.  4/21:Patient was admitted with acute symptomatic blood loss anemia and was noted to be FOBT positive. He has been started on IV Protonix drip with GI evaluation currently pending. His hemoglobin levels have improved after 1 unit PRBC transfusion. He is noted to be hypomagnesemic and will require supplementation with close follow-up.  4/22: Patient continues have stable hemoglobin levels, but he is febrile and will require further evaluation with chest x-ray as well as urinalysis and blood cultures that have now been ordered.  Plans to transition to PPI twice daily per gastroenterology recommendations.  PT evaluation ordered.  Anticipate  discharge in the next 24-48 hours pending stability.  4/23: Patient is in stable condition and has been tolerating his diet with no abdominal pain or any GI symptoms noted.  He will remain on PPI twice daily for the next 30 days and then daily thereafter.  I have discussed alcohol cessation with time and he agrees to try at home and understand the risks of continuing alcohol use.  Additionally, he agrees to follow-up with gastroenterology in 3 months for repeat EGD to document ulcer healing.  PT has assessed patient with recommendations for home health physical therapy which has been arranged.  No other acute events noted throughout the course of this admission aside from persistent hypokalemia and hypomagnesemia for which she will remain on supplementation at home.  He will need repeat lab work performed in the outpatient setting with his PCP to confirm that his levels have stabilized.  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  Discharge Instructions  Discharge Instructions    Diet - low sodium heart healthy   Complete by: As directed    Increase activity slowly   Complete by: As directed      Allergies as of 03/05/2020      Reactions   Lisinopril Swelling      Medication List    STOP taking these medications   HYDROcodone-acetaminophen 5-325 MG tablet Commonly known as: NORCO/VICODIN     TAKE these medications  folic acid 1 MG tablet Commonly known as: FOLVITE Take 1 tablet (1 mg total) by mouth daily. Start taking on: March 06, 2020   Magnesium Oxide 400 MG Caps Take 1 capsule (400 mg total) by mouth daily.   multivitamin with minerals Tabs  tablet Take 1 tablet by mouth daily.   pantoprazole 40 MG tablet Commonly known as: PROTONIX Please take 40mg  (1 tablet) twice daily for 30 days and then 1 tablet daily thereafter. What changed:   how much to take  how to take this  when to take this  additional instructions   potassium chloride 10 MEQ tablet Commonly known as: KLOR-CON Take 1 tablet (10 mEq total) by mouth 2 (two) times daily for 3 days.   thiamine 100 MG tablet Take 1 tablet (100 mg total) by mouth daily. What changed: Another medication with the same name was added. Make sure you understand how and when to take each.   thiamine 100 MG tablet Take 1 tablet (100 mg total) by mouth daily. Start taking on: March 06, 2020 What changed: You were already taking a medication with the same name, and this prescription was added. Make sure you understand how and when to take each.      Follow-up Information    [facility]   Specialty: [place] Why:   PT       [name], FNP Follow up in 1 week(s).   Specialty: Family Medicine Contact information: [address] [place] [address] [phone]        Rogene Houston, MD Follow up in 3 month(s).   Specialty: Gastroenterology Contact information: [address], [address] [place] [address] [phone]          Allergies  Allergen Reactions  . Lisinopril Swelling    Consultations:  GI   Procedures/Studies: DG Ribs Unilateral W/Chest Right  Result Date: 02/07/2020 CLINICAL DATA:  Right rib pain secondary to a fall last night. EXAM: RIGHT RIBS AND CHEST - 3+ VIEW COMPARISON:  08/13/2018 FINDINGS: There are fractures of the anterolateral aspects of the right ninth and tenth ribs. The other ribs are intact. Heart size and vascularity are normal. Lungs are clear. No pneumothorax or pleural effusion or lung contusion. No other bone abnormality. IMPRESSION: Fractures of the anterolateral  aspects of the right ninth and tenth ribs. Electronically Signed   By: Lorriane Shire M.D.   On: 02/07/2020 16:35   DG Chest Port 1 View  Result Date: 03/04/2020 CLINICAL DATA:  Fever, generalized weakness. EXAM: PORTABLE CHEST 1 VIEW COMPARISON:  Chest x-rays dated 02/07/2020 and 08/13/2018. FINDINGS: Heart size and mediastinal contours are stable. Lungs are clear. No pleural effusion is seen. Osseous structures about the chest are unremarkable. IMPRESSION: No active disease. No evidence of pneumonia or pulmonary edema. Electronically Signed   By: Franki Cabot M.D.   On: 03/04/2020 14:54     Discharge Exam: Vitals:   03/05/20 0008 03/05/20 0411  BP: 137/71 136/77  Pulse: 79 75  Resp: 19 16  Temp: 99.8 F (37.7 C) 99.8 F (37.7 C)  SpO2: 100% 100%   Vitals:   03/04/20 1417 03/04/20 1756 03/05/20 0008 03/05/20 0411  BP: 122/68 (!) 109/58 137/71 136/77  Pulse: 89 86 79 75  Resp: 18 18 19 16   Temp: 99.8 F (37.7 C) 99.8 F (37.7 C) 99.8 F (37.7 C) 99.8 F (37.7 C)  TempSrc: Oral Oral Oral Oral  SpO2: 100% 100% 100% 100%  Weight:      Height:        General: Pt is alert, awake, not in acute distress Cardiovascular: RRR, S1/S2 +, no rubs, no gallops Respiratory: CTA bilaterally, no wheezing, no rhonchi Abdominal: Soft, NT, ND, bowel sounds + Extremities: no edema, no cyanosis    The results of significant diagnostics from this hospitalization (including imaging, microbiology, ancillary and laboratory) are listed below for reference.     Microbiology: Recent Results (from the past 240 hour(s))  SARS CORONAVIRUS 2 (TAT 6-24 HRS) Nasopharyngeal Nasopharyngeal Swab     Status: None   Collection Time: 03/02/20  6:46 PM   Specimen: Nasopharyngeal Swab  Result Value Ref Range Status   SARS Coronavirus 2 NEGATIVE NEGATIVE Final    Comment: (NOTE) SARS-CoV-2 target nucleic acids are NOT DETECTED. The SARS-CoV-2 RNA is generally detectable in upper and lower respiratory  specimens during the acute phase of infection. Negative results do not preclude SARS-CoV-2 infection, do not rule out co-infections with other pathogens, and should not be used as the sole basis for treatment or other patient management decisions. Negative results must be combined with clinical observations, patient history, and epidemiological information. The expected result is Negative. Fact Sheet for Patients: SugarRoll.be Fact Sheet for Healthcare Providers: https://www.woods-mathews.com/ This test is not yet approved or cleared by the [place] FDA and  has been authorized for detection and/or diagnosis of SARS-CoV-2 by FDA under an Emergency Use Authorization (EUA). This EUA will remain  in effect (meaning this test can be used) for the duration of the COVID-19 declaration under Section 56 4(b)(1) of the Act, 21 U.S.C. section 360bbb-3(b)(1), unless the authorization is terminated or revoked sooner. Performed at [facility], [place] [address], [place], [place] [address]   Culture, blood (routine x 2)     Status: None (Preliminary result)   Collection Time: 03/04/20 12:01 PM   Specimen: BLOOD RIGHT HAND  Result Value Ref Range Status   Specimen Description BLOOD RIGHT HAND  Final   Special Requests   Final    BOTTLES DRAWN AEROBIC AND ANAEROBIC Blood Culture adequate volume   Culture   Final    NO GROWTH < 24 HOURS Performed at [facility], [address] [place], [place] [address]    Report Status PENDING  Incomplete  Culture, blood (routine x 2)     Status: None (Preliminary result)   Collection Time: 03/04/20 12:11 PM   Specimen: BLOOD LEFT HAND  Result Value Ref Range Status   Specimen Description BLOOD LEFT HAND  Final   Special Requests   Final    BOTTLES DRAWN AEROBIC AND ANAEROBIC Blood Culture adequate volume   Culture   Final    NO GROWTH < 24 HOURS Performed at [facility], [address], [place], [place]  [address]    Report Status PENDING  Incomplete     Labs: BNP (last 3 results) No results for input(s): BNP in the last 8760 hours. Basic Metabolic Panel: Recent Labs  Lab 03/02/20 1627 03/02/20 2121 03/03/20 0450 03/04/20 0443 03/05/20 0407  NA 133*  --  136 140 138  K 3.4*  --  3.7 3.3* 4.8  CL 94*  --  96* 103 104  CO2 15*  --  14* 24 23  GLUCOSE 98  --  96 156* 122*  BUN 14  --  13 8 6*  CREATININE 1.76*  --  1.63* 1.18 1.29*  CALCIUM 8.2*  --  8.3* 8.1* 8.4*  MG  --  1.1*  --  1.2* 1.6*  PHOS  --  3.0  --   --   --    Liver Function Tests: Recent Labs  Lab 03/02/20 1627 03/04/20 0443 03/05/20 0407  AST 104* 61* 70*  ALT 40 29 29  ALKPHOS 106 82 84  BILITOT 1.2 1.4* 1.2  PROT 7.5 6.3* 6.5  ALBUMIN 3.9 3.1* 3.2*   Recent Labs  Lab 03/02/20 1637 03/04/20 0443  LIPASE 184* 117*   No results for input(s): AMMONIA in the last 168 hours. CBC: Recent Labs  Lab 03/02/20 1627 03/02/20 2121 03/03/20 0450 03/04/20 0443 03/05/20 0407  WBC 8.9  --  7.5 5.4 6.1  NEUTROABS 5.3  --   --   --   --   HGB 7.8* 7.8* 9.1* 9.6* 9.2*  HCT 25.6* 25.6* 29.4* 30.2* 29.2*  MCV 101.2*  --  98.7 95.0 96.1  PLT 144*  --  126* 107* 115*   Cardiac Enzymes: No results for input(s): CKTOTAL, CKMB, CKMBINDEX, TROPONINI in the last 168 hours. BNP: Invalid input(s): POCBNP CBG: No results for input(s): GLUCAP in the last 168 hours. D-Dimer No results for input(s): DDIMER in the last 72 hours. Hgb A1c No results for input(s): HGBA1C in the last 72 hours. Lipid Profile No results for input(s): CHOL, HDL, LDLCALC, TRIG, CHOLHDL, LDLDIRECT in the last 72 hours. Thyroid function studies No results for input(s): TSH, T4TOTAL, T3FREE, THYROIDAB in the last 72 hours.  Invalid input(s): FREET3 Anemia work up No results for input(s): VITAMINB12, FOLATE, FERRITIN, TIBC, IRON, RETICCTPCT in the last 72 hours. Urinalysis    Component Value Date/Time   COLORURINE YELLOW 03/04/2020 1609    APPEARANCEUR CLEAR 03/04/2020 1609   LABSPEC 1.015 03/04/2020 1609   PHURINE 5.0 03/04/2020 1609   GLUCOSEU NEGATIVE 03/04/2020 1609   HGBUR SMALL (A) 03/04/2020 1609   BILIRUBINUR NEGATIVE 03/04/2020 1609   KETONESUR NEGATIVE 03/04/2020 1609   PROTEINUR 30 (A) 03/04/2020 1609   NITRITE NEGATIVE 03/04/2020 1609   LEUKOCYTESUR NEGATIVE 03/04/2020 1609   Sepsis Labs Invalid input(s): PROCALCITONIN,  WBC,  LACTICIDVEN Microbiology Recent Results (from the past 240 hour(s))  SARS CORONAVIRUS 2 (TAT 6-24 HRS) Nasopharyngeal Nasopharyngeal Swab     Status: None   Collection Time: 03/02/20  6:46 PM   Specimen: Nasopharyngeal Swab  Result Value Ref Range Status   SARS Coronavirus 2 NEGATIVE NEGATIVE Final    Comment: (NOTE) SARS-CoV-2 target nucleic acids are NOT DETECTED. The SARS-CoV-2 RNA is generally detectable in upper and lower respiratory specimens during the acute phase of infection. Negative results do not preclude SARS-CoV-2 infection, do not rule out co-infections with other pathogens, and should not be used as the sole basis for treatment or other patient management decisions. Negative results must be combined with clinical observations, patient history, and epidemiological information. The expected result is Negative. Fact Sheet for Patients: SugarRoll.be Fact Sheet for Healthcare Providers: https://www.woods-mathews.com/ This test is not yet approved or cleared by the [place] FDA and  has been authorized for detection and/or diagnosis of SARS-CoV-2 by FDA under an Emergency Use Authorization (EUA). This EUA will remain  in effect (meaning this test can be used) for the duration of the COVID-19 declaration under Section 56 4(b)(1) of the Act, 21 U.S.C. section 360bbb-3(b)(1), unless the authorization is terminated or revoked sooner. Performed at [facility], [place] [address], [place], [place] [address]   Culture,  blood (routine x  2)     Status: None (Preliminary result)   Collection Time: 03/04/20 12:01 PM   Specimen: BLOOD RIGHT HAND  Result Value Ref Range Status   Specimen Description BLOOD RIGHT HAND  Final   Special Requests   Final    BOTTLES DRAWN AEROBIC AND ANAEROBIC Blood Culture adequate volume   Culture   Final    NO GROWTH < 24 HOURS Performed at [facility], [address], [place], [place] [address]    Report Status PENDING  Incomplete  Culture, blood (routine x 2)     Status: None (Preliminary result)   Collection Time: 03/04/20 12:11 PM   Specimen: BLOOD LEFT HAND  Result Value Ref Range Status   Specimen Description BLOOD LEFT HAND  Final   Special Requests   Final    BOTTLES DRAWN AEROBIC AND ANAEROBIC Blood Culture adequate volume   Culture   Final    NO GROWTH < 24 HOURS Performed at [facility], [address] [place], [place] [address]    Report Status PENDING  Incomplete     Time coordinating discharge: 35 minutes  SIGNED:   Rodena Goldmann, DO [facility] 03/05/2020, 9:50 AM  If 7PM-7AM, please contact night-coverage [email or web address]

## 2020-03-05 NOTE — TOC Transition Note (Signed)
Transition of Care [facility]) - CM/SW Discharge Note   Patient Details  Name: VICTOR SEAGRAVE MRN: [id number] Date of Birth: Jan 18, 1951  Transition of Care [facility]) CM/SW Contact:  Boneta Lucks, RN Phone Number: 03/05/2020, 10:56 AM   Clinical Narrative:   Patient discharging home. Home health orders are in for HHPT and added to AVS. Romualdo Bolk with [facility] updated.    Final next level of care: [place] Barriers to Discharge: Barriers Resolved   Patient Goals and CMS Choice     Discharge Placement            Home with [place]  Discharge Plan and Services In-house Referral: Clinical Social Work        Readmission Risk Interventions Readmission Risk Prevention Plan 04/20/2019  Transportation Screening Complete  PCP or Specialist Appt within 5-7 Days Complete  Home Care Screening Complete  Medication Review (RN CM) Complete  Some recent data might be hidden

## 2020-03-09 LAB — CULTURE, BLOOD (ROUTINE X 2)
Culture: NO GROWTH
Culture: NO GROWTH
Special Requests: ADEQUATE
Special Requests: ADEQUATE

## 2020-03-12 ENCOUNTER — Other Ambulatory Visit (INDEPENDENT_AMBULATORY_CARE_PROVIDER_SITE_OTHER): Payer: Self-pay | Admitting: Internal Medicine

## 2020-03-12 DIAGNOSIS — D62 Acute posthemorrhagic anemia: Secondary | ICD-10-CM

## 2020-03-12 DIAGNOSIS — R945 Abnormal results of liver function studies: Secondary | ICD-10-CM

## 2020-03-12 DIAGNOSIS — K922 Gastrointestinal hemorrhage, unspecified: Secondary | ICD-10-CM

## 2020-03-12 DIAGNOSIS — R7989 Other specified abnormal findings of blood chemistry: Secondary | ICD-10-CM

## 2020-03-15 ENCOUNTER — Emergency Department (HOSPITAL_COMMUNITY): Payer: Medicare Other

## 2020-03-15 ENCOUNTER — Other Ambulatory Visit: Payer: Self-pay

## 2020-03-15 ENCOUNTER — Encounter (HOSPITAL_COMMUNITY): Payer: Self-pay | Admitting: *Deleted

## 2020-03-15 ENCOUNTER — Inpatient Hospital Stay (HOSPITAL_COMMUNITY)
Admission: EM | Admit: 2020-03-15 | Discharge: 2020-03-23 | DRG: 871 | Disposition: A | Discharge status: Home Health | Payer: Medicare Other | Attending: Internal Medicine | Admitting: Internal Medicine

## 2020-03-15 DIAGNOSIS — J90 Pleural effusion, not elsewhere classified: Secondary | ICD-10-CM | POA: Diagnosis present

## 2020-03-15 DIAGNOSIS — E860 Dehydration: Secondary | ICD-10-CM | POA: Diagnosis present

## 2020-03-15 DIAGNOSIS — Z823 Family history of stroke: Secondary | ICD-10-CM

## 2020-03-15 DIAGNOSIS — G9341 Metabolic encephalopathy: Secondary | ICD-10-CM | POA: Diagnosis present

## 2020-03-15 DIAGNOSIS — D500 Iron deficiency anemia secondary to blood loss (chronic): Secondary | ICD-10-CM | POA: Diagnosis present

## 2020-03-15 DIAGNOSIS — R945 Abnormal results of liver function studies: Secondary | ICD-10-CM | POA: Diagnosis not present

## 2020-03-15 DIAGNOSIS — R159 Full incontinence of feces: Secondary | ICD-10-CM | POA: Diagnosis present

## 2020-03-15 DIAGNOSIS — K55069 Acute infarction of intestine, part and extent unspecified: Secondary | ICD-10-CM | POA: Diagnosis present

## 2020-03-15 DIAGNOSIS — R6520 Severe sepsis without septic shock: Secondary | ICD-10-CM | POA: Diagnosis present

## 2020-03-15 DIAGNOSIS — E785 Hyperlipidemia, unspecified: Secondary | ICD-10-CM | POA: Diagnosis present

## 2020-03-15 DIAGNOSIS — A4159 Other Gram-negative sepsis: Secondary | ICD-10-CM | POA: Diagnosis present

## 2020-03-15 DIAGNOSIS — IMO0002 Reserved for concepts with insufficient information to code with codable children: Secondary | ICD-10-CM

## 2020-03-15 DIAGNOSIS — E876 Hypokalemia: Secondary | ICD-10-CM | POA: Diagnosis not present

## 2020-03-15 DIAGNOSIS — Z8042 Family history of malignant neoplasm of prostate: Secondary | ICD-10-CM

## 2020-03-15 DIAGNOSIS — K852 Alcohol induced acute pancreatitis without necrosis or infection: Secondary | ICD-10-CM | POA: Diagnosis not present

## 2020-03-15 DIAGNOSIS — Z515 Encounter for palliative care: Secondary | ICD-10-CM | POA: Diagnosis not present

## 2020-03-15 DIAGNOSIS — D649 Anemia, unspecified: Secondary | ICD-10-CM | POA: Diagnosis present

## 2020-03-15 DIAGNOSIS — K859 Acute pancreatitis without necrosis or infection, unspecified: Secondary | ICD-10-CM

## 2020-03-15 DIAGNOSIS — E861 Hypovolemia: Secondary | ICD-10-CM | POA: Diagnosis present

## 2020-03-15 DIAGNOSIS — N179 Acute kidney failure, unspecified: Secondary | ICD-10-CM | POA: Diagnosis present

## 2020-03-15 DIAGNOSIS — I129 Hypertensive chronic kidney disease with stage 1 through stage 4 chronic kidney disease, or unspecified chronic kidney disease: Secondary | ICD-10-CM | POA: Diagnosis present

## 2020-03-15 DIAGNOSIS — Z79899 Other long term (current) drug therapy: Secondary | ICD-10-CM

## 2020-03-15 DIAGNOSIS — N1831 Chronic kidney disease, stage 3a: Secondary | ICD-10-CM | POA: Diagnosis present

## 2020-03-15 DIAGNOSIS — F1020 Alcohol dependence, uncomplicated: Secondary | ICD-10-CM | POA: Diagnosis not present

## 2020-03-15 DIAGNOSIS — E871 Hypo-osmolality and hyponatremia: Secondary | ICD-10-CM | POA: Diagnosis present

## 2020-03-15 DIAGNOSIS — Z820 Family history of epilepsy and other diseases of the nervous system: Secondary | ICD-10-CM

## 2020-03-15 DIAGNOSIS — C61 Malignant neoplasm of prostate: Secondary | ICD-10-CM | POA: Diagnosis present

## 2020-03-15 DIAGNOSIS — B961 Klebsiella pneumoniae [K. pneumoniae] as the cause of diseases classified elsewhere: Secondary | ICD-10-CM | POA: Diagnosis present

## 2020-03-15 DIAGNOSIS — K7011 Alcoholic hepatitis with ascites: Secondary | ICD-10-CM | POA: Diagnosis present

## 2020-03-15 DIAGNOSIS — D631 Anemia in chronic kidney disease: Secondary | ICD-10-CM | POA: Diagnosis present

## 2020-03-15 DIAGNOSIS — D696 Thrombocytopenia, unspecified: Secondary | ICD-10-CM | POA: Diagnosis not present

## 2020-03-15 DIAGNOSIS — K861 Other chronic pancreatitis: Secondary | ICD-10-CM | POA: Diagnosis present

## 2020-03-15 DIAGNOSIS — I10 Essential (primary) hypertension: Secondary | ICD-10-CM | POA: Diagnosis not present

## 2020-03-15 DIAGNOSIS — Z7189 Other specified counseling: Secondary | ICD-10-CM

## 2020-03-15 DIAGNOSIS — Z8249 Family history of ischemic heart disease and other diseases of the circulatory system: Secondary | ICD-10-CM

## 2020-03-15 DIAGNOSIS — Z20822 Contact with and (suspected) exposure to covid-19: Secondary | ICD-10-CM | POA: Diagnosis present

## 2020-03-15 DIAGNOSIS — E7800 Pure hypercholesterolemia, unspecified: Secondary | ICD-10-CM | POA: Diagnosis present

## 2020-03-15 DIAGNOSIS — A414 Sepsis due to anaerobes: Secondary | ICD-10-CM

## 2020-03-15 DIAGNOSIS — I340 Nonrheumatic mitral (valve) insufficiency: Secondary | ICD-10-CM | POA: Diagnosis not present

## 2020-03-15 DIAGNOSIS — I81 Portal vein thrombosis: Secondary | ICD-10-CM | POA: Diagnosis present

## 2020-03-15 DIAGNOSIS — Z8546 Personal history of malignant neoplasm of prostate: Secondary | ICD-10-CM

## 2020-03-15 DIAGNOSIS — A419 Sepsis, unspecified organism: Secondary | ICD-10-CM | POA: Diagnosis not present

## 2020-03-15 DIAGNOSIS — D6959 Other secondary thrombocytopenia: Secondary | ICD-10-CM | POA: Diagnosis present

## 2020-03-15 DIAGNOSIS — R7989 Other specified abnormal findings of blood chemistry: Secondary | ICD-10-CM | POA: Diagnosis present

## 2020-03-15 DIAGNOSIS — J181 Lobar pneumonia, unspecified organism: Secondary | ICD-10-CM | POA: Diagnosis present

## 2020-03-15 DIAGNOSIS — E46 Unspecified protein-calorie malnutrition: Secondary | ICD-10-CM | POA: Diagnosis present

## 2020-03-15 DIAGNOSIS — F1010 Alcohol abuse, uncomplicated: Secondary | ICD-10-CM | POA: Diagnosis present

## 2020-03-15 DIAGNOSIS — I82890 Acute embolism and thrombosis of other specified veins: Secondary | ICD-10-CM | POA: Diagnosis present

## 2020-03-15 DIAGNOSIS — K627 Radiation proctitis: Secondary | ICD-10-CM | POA: Diagnosis present

## 2020-03-15 DIAGNOSIS — I361 Nonrheumatic tricuspid (valve) insufficiency: Secondary | ICD-10-CM | POA: Diagnosis not present

## 2020-03-15 DIAGNOSIS — K7010 Alcoholic hepatitis without ascites: Secondary | ICD-10-CM | POA: Diagnosis present

## 2020-03-15 DIAGNOSIS — E8770 Fluid overload, unspecified: Secondary | ICD-10-CM | POA: Diagnosis not present

## 2020-03-15 DIAGNOSIS — N2889 Other specified disorders of kidney and ureter: Secondary | ICD-10-CM | POA: Diagnosis present

## 2020-03-15 DIAGNOSIS — Y842 Radiological procedure and radiotherapy as the cause of abnormal reaction of the patient, or of later complication, without mention of misadventure at the time of the procedure: Secondary | ICD-10-CM | POA: Diagnosis present

## 2020-03-15 DIAGNOSIS — Z8711 Personal history of peptic ulcer disease: Secondary | ICD-10-CM

## 2020-03-15 DIAGNOSIS — R0989 Other specified symptoms and signs involving the circulatory and respiratory systems: Secondary | ICD-10-CM

## 2020-03-15 DIAGNOSIS — R569 Unspecified convulsions: Secondary | ICD-10-CM | POA: Diagnosis present

## 2020-03-15 DIAGNOSIS — R197 Diarrhea, unspecified: Secondary | ICD-10-CM | POA: Diagnosis not present

## 2020-03-15 LAB — APTT: aPTT: 45 seconds — ABNORMAL HIGH (ref 24–36)

## 2020-03-15 LAB — CBC
HCT: 31.5 % — ABNORMAL LOW (ref 39.0–52.0)
HCT: 27.4 % — ABNORMAL LOW (ref 39.0–52.0)
Hemoglobin: 9.9 g/dL — ABNORMAL LOW (ref 13.0–17.0)
Hemoglobin: 8.7 g/dL — ABNORMAL LOW (ref 13.0–17.0)
MCH: 30.1 pg (ref 26.0–34.0)
MCH: 29.9 pg (ref 26.0–34.0)
MCHC: 31.4 g/dL (ref 30.0–36.0)
MCHC: 31.8 g/dL (ref 30.0–36.0)
MCV: 95.7 fL (ref 80.0–100.0)
MCV: 94.2 fL (ref 80.0–100.0)
Platelets: 46 10*3/uL — ABNORMAL LOW (ref 150–400)
Platelets: 37 10*3/uL — ABNORMAL LOW (ref 150–400)
RBC: 3.29 MIL/uL — ABNORMAL LOW (ref 4.22–5.81)
RBC: 2.91 MIL/uL — ABNORMAL LOW (ref 4.22–5.81)
RDW: 15.2 % (ref 11.5–15.5)
RDW: 15 % (ref 11.5–15.5)
WBC: 9.8 10*3/uL (ref 4.0–10.5)
WBC: 8.7 10*3/uL (ref 4.0–10.5)
nRBC: 0 % (ref 0.0–0.2)
nRBC: 0 % (ref 0.0–0.2)

## 2020-03-15 LAB — COMPREHENSIVE METABOLIC PANEL
ALT: 26 U/L (ref 0–44)
AST: 62 U/L — ABNORMAL HIGH (ref 15–41)
Albumin: 3 g/dL — ABNORMAL LOW (ref 3.5–5.0)
Alkaline Phosphatase: 71 U/L (ref 38–126)
Anion gap: 15 (ref 5–15)
BUN: 35 mg/dL — ABNORMAL HIGH (ref 8–23)
CO2: 20 mmol/L — ABNORMAL LOW (ref 22–32)
Calcium: 8.2 mg/dL — ABNORMAL LOW (ref 8.9–10.3)
Chloride: 97 mmol/L — ABNORMAL LOW (ref 98–111)
Creatinine, Ser: 2.44 mg/dL — ABNORMAL HIGH (ref 0.61–1.24)
GFR calc Af Amer: 30 mL/min — ABNORMAL LOW (ref >60)
GFR calc non Af Amer: 26 mL/min — ABNORMAL LOW (ref >60)
Glucose, Bld: 137 mg/dL — ABNORMAL HIGH (ref 70–99)
Potassium: 4.1 mmol/L (ref 3.5–5.1)
Sodium: 132 mmol/L — ABNORMAL LOW (ref 135–145)
Total Bilirubin: 2.9 mg/dL — ABNORMAL HIGH (ref 0.3–1.2)
Total Protein: 6.7 g/dL (ref 6.5–8.1)

## 2020-03-15 LAB — RESPIRATORY PANEL BY RT PCR (FLU A&B, COVID)
Influenza A by PCR: NEGATIVE
Influenza B by PCR: NEGATIVE
SARS Coronavirus 2 by RT PCR: NEGATIVE

## 2020-03-15 LAB — FIBRINOGEN: Fibrinogen: 390 mg/dL (ref 210–475)

## 2020-03-15 LAB — SAVE SMEAR(SSMR), FOR PROVIDER SLIDE REVIEW

## 2020-03-15 LAB — PROTIME-INR
INR: 1.3 — ABNORMAL HIGH (ref 0.8–1.2)
Prothrombin Time: 15.8 seconds — ABNORMAL HIGH (ref 11.4–15.2)

## 2020-03-15 LAB — LACTIC ACID, PLASMA
Lactic Acid, Venous: 1.8 mmol/L (ref 0.5–1.9)
Lactic Acid, Venous: 1.6 mmol/L (ref 0.5–1.9)

## 2020-03-15 LAB — LIPASE, BLOOD: Lipase: 655 U/L — ABNORMAL HIGH (ref 11–51)

## 2020-03-15 LAB — TSH: TSH: 5.574 u[IU]/mL — ABNORMAL HIGH (ref 0.350–4.500)

## 2020-03-15 MED ORDER — METRONIDAZOLE IN NACL 5-0.79 MG/ML-% IV SOLN
500.0000 mg | Freq: Once | INTRAVENOUS | Status: AC
  Administered 2020-03-15: 500 mg via INTRAVENOUS
  Filled 2020-03-15: qty 100

## 2020-03-15 MED ORDER — TRAZODONE HCL 50 MG PO TABS
50.0000 mg | ORAL_TABLET | Freq: Every evening | ORAL | Status: DC | PRN
  Administered 2020-03-15: 50 mg via ORAL
  Filled 2020-03-15: qty 1

## 2020-03-15 MED ORDER — LORAZEPAM 1 MG PO TABS: 1.0000 mg | ORAL_TABLET | ORAL | Status: AC | PRN

## 2020-03-15 MED ORDER — HYDROMORPHONE HCL 1 MG/ML IJ SOLN: 0.5000 mg | INTRAMUSCULAR | Status: DC | PRN

## 2020-03-15 MED ORDER — POLYETHYLENE GLYCOL 3350 17 G PO PACK: 17.0000 g | PACK | Freq: Every day | ORAL | Status: DC | PRN

## 2020-03-15 MED ORDER — ADULT MULTIVITAMIN W/MINERALS CH
1.0000 | ORAL_TABLET | Freq: Every day | ORAL | Status: DC
  Administered 2020-03-16 – 2020-03-23 (×8): 1 via ORAL
  Filled 2020-03-15 (×8): qty 1

## 2020-03-15 MED ORDER — ACETAMINOPHEN 325 MG PO TABS
650.0000 mg | ORAL_TABLET | Freq: Once | ORAL | Status: AC
  Administered 2020-03-15: 650 mg via ORAL
  Filled 2020-03-15: qty 2

## 2020-03-15 MED ORDER — PANTOPRAZOLE SODIUM 40 MG PO TBEC
40.0000 mg | DELAYED_RELEASE_TABLET | Freq: Two times a day (BID) | ORAL | Status: DC
  Administered 2020-03-16 – 2020-03-23 (×16): 40 mg via ORAL
  Filled 2020-03-15 (×16): qty 1

## 2020-03-15 MED ORDER — SODIUM CHLORIDE 0.9 % IV BOLUS
1000.0000 mL | Freq: Once | INTRAVENOUS | Status: AC
  Administered 2020-03-15: 1000 mL via INTRAVENOUS

## 2020-03-15 MED ORDER — SODIUM CHLORIDE 0.9 % IV SOLN: 2.0000 g | INTRAVENOUS | Status: DC

## 2020-03-15 MED ORDER — MAGNESIUM OXIDE 400 (241.3 MG) MG PO TABS
400.0000 mg | ORAL_TABLET | Freq: Every day | ORAL | Status: DC
  Administered 2020-03-16 – 2020-03-23 (×8): 400 mg via ORAL
  Filled 2020-03-15 (×8): qty 1

## 2020-03-15 MED ORDER — SODIUM CHLORIDE 0.9 % IV SOLN
500.0000 mg | Freq: Every day | INTRAVENOUS | Status: DC
  Administered 2020-03-15: 500 mg via INTRAVENOUS
  Filled 2020-03-15: qty 500

## 2020-03-15 MED ORDER — IOHEXOL 300 MG/ML  SOLN
75.0000 mL | Freq: Once | INTRAMUSCULAR | Status: AC | PRN
  Administered 2020-03-15: 75 mL via INTRAVENOUS

## 2020-03-15 MED ORDER — LORAZEPAM 2 MG/ML IJ SOLN: 1.0000 mg | INTRAMUSCULAR | Status: AC | PRN

## 2020-03-15 MED ORDER — SODIUM CHLORIDE 0.9 % IV SOLN
2.0000 g | Freq: Once | INTRAVENOUS | Status: AC
  Administered 2020-03-15: 2 g via INTRAVENOUS
  Filled 2020-03-15: qty 20

## 2020-03-15 MED ORDER — SODIUM CHLORIDE 0.9% FLUSH: 3.0000 mL | Freq: Once | INTRAVENOUS | Status: DC

## 2020-03-15 MED ORDER — ACETAMINOPHEN 650 MG RE SUPP: 325.0000 mg | Freq: Four times a day (QID) | RECTAL | Status: DC | PRN

## 2020-03-15 MED ORDER — THIAMINE HCL 100 MG PO TABS
100.0000 mg | ORAL_TABLET | Freq: Every day | ORAL | Status: DC
  Administered 2020-03-16 – 2020-03-23 (×8): 100 mg via ORAL
  Filled 2020-03-15 (×9): qty 1

## 2020-03-15 MED ORDER — FOLIC ACID 1 MG PO TABS
1.0000 mg | ORAL_TABLET | Freq: Every day | ORAL | Status: DC
  Administered 2020-03-16 – 2020-03-23 (×8): 1 mg via ORAL
  Filled 2020-03-15 (×8): qty 1

## 2020-03-15 MED ORDER — ACETAMINOPHEN 325 MG PO TABS
325.0000 mg | ORAL_TABLET | Freq: Four times a day (QID) | ORAL | Status: DC | PRN
  Administered 2020-03-15 – 2020-03-18 (×3): 325 mg via ORAL
  Filled 2020-03-15 (×3): qty 1

## 2020-03-15 MED ORDER — LACTATED RINGERS IV SOLN: INTRAVENOUS | Status: DC

## 2020-03-15 NOTE — ED Provider Notes (Signed)
[facility] EMERGENCY DEPARTMENT Provider Note   CSN: [id number] Arrival date & time: 03/15/20  1016     History Chief Complaint  Patient presents with  . Diarrhea    Clifford [name] is a 69 y.o. male past medical history significant for alcoholism, anemia, high cholesterol, hypertension, prostate cancer, seizures presents to emergency department today with chief complaint of diarrhea and generalized weakness x4 days.  Patient states he has had too many episodes of nonbloody diarrhea to count.  He describes his stool as soft and brown.  He is also endorsing decreased appetite with limited PO intake. Patient admits to drinking 2-3 pints of liquor per week. His last drink was day of symptom onset. He denies fever, chills, cough, mopped assist, chest pain, abdominal pain, nausea, vomiting, gross hematuria, dysuria, dark tarry stool.   Chart review shows he had recent ED admission for symptomatic anemia.   Past Medical History:  Diagnosis Date  . Alcoholism ([place])   . Anemia   . High cholesterol   . Hypertension   . Prostate cancer ([place])   . Seizures [facility])     Patient Active Problem List   Diagnosis Date Noted  . Symptomatic anemia 03/02/2020  . UGI bleed 03/02/2020  . Hypomagnesemia 12/09/2019  . Normocytic anemia 12/08/2019  . Positive fecal occult blood test 12/08/2019  . Abnormal LFTs (liver function tests) 12/08/2019  . Alcoholic ketosis 99991111  . Hyponatremia   . Weakness 04/18/2019  . Metabolic acidosis [id number]  . AKI (acute kidney injury) ([place]) 04/18/2019  . Bleeding hemorrhoid   . Thrombocytopenia ([place]) 04/08/2018  . Malignant neoplasm of prostate ([place]) 12/04/2016  . Anemia in other chronic diseases classified elsewhere 07/11/2016  . Alcoholism ([place]) 07/04/2016  . Seizures ([place]) 07/04/2016  . Hyperlipidemia 07/04/2016  . Essential hypertension 07/04/2016    Past Surgical History:  Procedure Laterality Date  . BIOPSY  03/03/2020   Procedure: BIOPSY;   Surgeon: Rogene Houston, MD;  Location: [facility];  Service: Endoscopy;;  gastric  . CIRCUMCISION    . COLONOSCOPY N/A 09/13/2016   Procedure: COLONOSCOPY;  Surgeon: Rogene Houston, MD;  Location: [facility];  Service: Endoscopy;  Laterality: N/A;  2:15  . COLONOSCOPY N/A 04/06/2018   Procedure: COLONOSCOPY;  Surgeon: Rogene Houston, MD;  Location[facility];  Service: Endoscopy;  Laterality: N/A;  . ESOPHAGOGASTRODUODENOSCOPY (EGD) WITH PROPOFOL N/A 03/03/2020   Procedure: ESOPHAGOGASTRODUODENOSCOPY (EGD) WITH PROPOFOL;  Surgeon: Rogene Houston, MD;  [facility];  Service: Endoscopy;  Laterality: N/A;  . FLEXIBLE SIGMOIDOSCOPY N/A 01/10/2018   Procedure: FLEXIBLE SIGMOIDOSCOPY;  Surgeon: Rogene Houston, MD;  Location: [facility];  Service: Endoscopy;  Laterality: N/A;  1:55  . FLEXIBLE SIGMOIDOSCOPY N/A 06/08/2018   Procedure: FLEXIBLE SIGMOIDOSCOPY;  Surgeon: Danie Binder, MD;  Location: [facility];  Service: Endoscopy;  Laterality: N/A;  . HEMORRHOID SURGERY N/A 04/09/2018   Procedure: HEMORRHOIDECTOMY;  Surgeon: Aviva Signs, MD;  Location: [facility] ORS;  Service: General;  Laterality: N/A;  . NO PAST SURGERIES    . POLYPECTOMY  04/06/2018   Procedure: POLYPECTOMY;  Surgeon: Rogene Houston, MD;  Location[facility];  Service: Endoscopy;;  sigmoid  . PROSTATE BIOPSY    . VIDEO ASSISTED THORACOSCOPY (VATS)/EMPYEMA Left 07/07/2018   Procedure: VIDEO ASSISTED THORACOSCOPY (VATS)/DRAINAGE OF EMPYEMA, DECORTICATION;  Surgeon: [name], MD;  Location: [place];  Service: Thoracic;  Laterality: Left;       Family  History  Problem Relation Age of Onset  . Alzheimer's disease Mother   . Hypertension Mother   . Hypertension Father   . Stroke Father   . Prostate cancer Father   . Hypertension Brother     Social History   Tobacco Use  . Smoking status: Never Smoker  . Smokeless tobacco: Never Used  Substance Use Topics  . Alcohol use: Yes     Comment: 1-2 bottles of Gin every few days  . Drug use: No    Home Medications Prior to Admission medications   Medication Sig Start Date End Date Taking? Authorizing Provider  folic acid (FOLVITE) 1 MG tablet Take 1 tablet (1 mg total) by mouth daily. 03/06/20 04/05/20  Manuella Ghazi, Pratik D, DO  Magnesium Oxide 400 MG CAPS Take 1 capsule (400 mg total) by mouth daily. 03/05/20   Manuella Ghazi, Pratik D, DO  Multiple Vitamin (MULTIVITAMIN WITH MINERALS) TABS tablet Take 1 tablet by mouth daily. Patient not taking: Reported on 03/02/2020 12/10/19   Murlean Iba, MD  pantoprazole (PROTONIX) 40 MG tablet Please take 40mg  (1 tablet) twice daily for 30 days and then 1 tablet daily thereafter. 03/05/20   Manuella Ghazi, Pratik D, DO  potassium chloride (KLOR-CON) 10 MEQ tablet Take 1 tablet (10 mEq total) by mouth 2 (two) times daily for 3 days. 12/09/19 12/12/19  Johnson, Clanford L, MD  thiamine 100 MG tablet Take 1 tablet (100 mg total) by mouth daily. 03/05/20 04/04/20  Manuella Ghazi, Pratik D, DO  thiamine 100 MG tablet Take 1 tablet (100 mg total) by mouth daily. 03/06/20   Manuella Ghazi, Pratik D, DO    Allergies    Lisinopril  Review of Systems   Review of Systems All other systems are reviewed and are negative for acute change except as noted in the HPI.  Physical Exam Updated Vital Signs BP 140/73   Pulse 93   Temp 100.2 F (37.9 C) (Oral)   Resp 16   Ht 5\' 6"  (1.676 m)   Wt 61.2 kg   SpO2 99%   BMI 21.79 kg/m   Physical Exam Vitals and nursing note reviewed.  Constitutional:      General: He is not in acute distress.    Appearance: He is not ill-appearing.  HENT:     Head: Normocephalic and atraumatic.     Right Ear: Tympanic membrane and external ear normal.     Left Ear: Tympanic membrane and external ear normal.     Nose: Nose normal.     Mouth/Throat:     Mouth: Mucous membranes are dry.     Pharynx: Oropharynx is clear.  Eyes:     General: No scleral icterus.       Right eye: No discharge.         Left eye: No discharge.     Extraocular Movements: Extraocular movements intact.     Conjunctiva/sclera: Conjunctivae normal.     Pupils: Pupils are equal, round, and reactive to light.  Neck:     Vascular: No JVD.  Cardiovascular:     Rate and Rhythm: Normal rate and regular rhythm.     Pulses: Normal pulses.          Radial pulses are 2+ on the right side and 2+ on the left side.     Heart sounds: Normal heart sounds.  Pulmonary:     Comments: Lungs clear to auscultation in all fields. Symmetric chest rise. No wheezing, rales, or rhonchi. Abdominal:  Comments: Abdomen is soft, non-distended, and non-tender in all quadrants. No rigidity, no guarding. No peritoneal signs.  Musculoskeletal:        General: Normal range of motion.     Cervical back: Normal range of motion.  Skin:    General: Skin is warm and dry.     Capillary Refill: Capillary refill takes less than 2 seconds.  Neurological:     Mental Status: He is oriented to person, place, and time.     GCS: GCS eye subscore is 4. GCS verbal subscore is 5. GCS motor subscore is 6.     Comments: Fluent speech, no facial droop.  Psychiatric:        Behavior: Behavior normal.     ED Results / Procedures / Treatments   Labs (all labs ordered are listed, but only abnormal results are displayed) Labs Reviewed  LIPASE, BLOOD - Abnormal; Notable for the following components:      Result Value   Lipase 655 (*)    All other components within normal limits  COMPREHENSIVE METABOLIC PANEL - Abnormal; Notable for the following components:   Sodium 132 (*)    Chloride 97 (*)    CO2 20 (*)    Glucose, Bld 137 (*)    BUN 35 (*)    Creatinine, Ser 2.44 (*)    Calcium 8.2 (*)    Albumin 3.0 (*)    AST 62 (*)    Total Bilirubin 2.9 (*)    GFR calc non Af Amer 26 (*)    GFR calc Af Amer 30 (*)    All other components within normal limits  CBC - Abnormal; Notable for the following components:   RBC 3.29 (*)    Hemoglobin 9.9 (*)     HCT 31.5 (*)    Platelets 46 (*)    All other components within normal limits  URINALYSIS, ROUTINE W REFLEX MICROSCOPIC    EKG None  Radiology No results found.  Procedures Procedures (including critical care time)  Medications Ordered in ED Medications  sodium chloride flush (NS) 0.9 % injection 3 mL (3 mLs Intravenous Not Given 03/15/20 1353)    ED Course  I have reviewed the triage vital signs and the nursing notes.  Pertinent labs & imaging results that were available during my care of the patient were reviewed by me and considered in my medical decision making (see chart for details).    MDM Rules/Calculators/A&P                      History provided by patient with additional history obtained from chart review.    .Patient seen and examined. Patient presents awake, alert, hemodynamically stable, non toxic, patient with low grade at 100.2. On exam he appears dehydrated with dry mucous membranes but otherwise well.  He has no abdominal tenderness, no peritoneal signs.  No CVA tenderness. Labs were collected in triage.  I viewed results. Lipase elevated at 655, this is significantly higher than previous..  No leukocytosis, hemoglobin of 9.9 appears improved compared to 10 days ago.  Patient does have thrombocytopenia with platelet count of 46, has history of low platelets but not this low, x10 days ago platelets were 115.  Patient with mild hyponatremia with sodium of 132.  He is slightly acidotic with bicarb of 20, he does have a normal anion gap.  BUN/creatinine is acutely elevated, today at 35/2.44 again compared to x 10 days ago when it was 6/1.29.  This is likely related to dehydration as he had had almost no p.o. intake and numerous episodes of diarrhea.  Total bilirubin is elevated at 2.9, GFR is low at 30.  Patient given liter of IV fluids.  He denies need for nausea or pain medicine.  CT abdomen pelvis ordered.  Patient's temperature was rechecked and he was found to be  febrile to 101.3.  Patient given Tylenol and started on metronidazole and rocephin for possible abdominal infection. Lactic acid and blood cultures collected. Lactic acid is within normal range. The patient was discussed with and seen by Dr. Roderic Palau who agrees with the treatment plan.  Patient care transferred to [facility]. [facility] Pa-C at the end of my shift pending CT A/P. Patient presentation, ED course, and plan of care discussed with review of all pertinent labs and imaging. Please see her note for further details regarding further ED course and disposition. Anticipate admission.    Portions of this note were generated with [employer]. Dictation errors may occur despite best attempts at proofreading.   Final Clinical Impression(s) / ED Diagnoses Final diagnoses:  None    Rx / DC Orders ED Discharge Orders    None       Flint Melter 03/15/20 1625    Milton Ferguson, MD 03/16/20 [phone]

## 2020-03-15 NOTE — ED Triage Notes (Signed)
Pt c/o diarrhea all weekend long and states he feels weak; pt states he has no appetite

## 2020-03-15 NOTE — ED Provider Notes (Signed)
Care assumed from [facility], PA-C at shift change with CT scan pending.   In brief, this patient is a 69 y.o. M past medical history of alcoholism, anemia, high cholesterol, hypertension presents for evaluation of diarrhea and generalized weakness x4 days.  He has had multiple episodes of nonbloody diarrhea.  He states that he has had decreased appetite and has not been able to tolerate much p.o.  He does report he drinks about 2 to 3 pints of liquor per week.  His last drink was about 4 days ago.  No fevers, chills, chest pain, abdominal pain.  Please see note from previous provider for full history/physical exam.  Physical Exam  BP (!) 147/85   Pulse 92   Temp 99.5 F (37.5 C) (Oral)   Resp 16   Ht 5\' 6"  (1.676 m)   Wt 61.2 kg   SpO2 99%   BMI 21.79 kg/m   Physical Exam  ED Course/Procedures     Procedures  MDM   PLAN: Patient will be admitted.  He has been started on Flagyl and Rocephin and cultures have been sent.  MDM: Patient febrile at 101.3.  Lactic was normal.  CBC shows no leukocytosis.  Hemoglobin stable at 9.9.  CMP showed elevation his creatinine at 35 and 2.44.  He had a CMP done 10 days ago which showed a creatinine of 1.29.  Lipase is elevated at 655.  He has had previous elevations lipase performed.  He had one done that was 11 days ago that was 117.  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  He has Pancreatic edema with irregular substantial dilatation of the feet reticulocyte duct of the tail of the pancreas.  He also has a lesion noted.  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  He has a 2 x 1 cm soft tissue lesion or fluid collection involving the posterior wall of the gastric fundus that appears to communicate with the pancreatic tail lesion.  He has evidence of hepatic steatosis.  He also has a 10 mm hypoattenuating lesion noted to the lower pole  of left kidney.  Discussed patient with Dr. Oneida Alar (GI) who recommends discussing with oncology regarding the thrombus.   Discussed patient with Dr. Delton Coombes (oncologist) who recommends that patient should be started on a heparin drip and then can transition to PO eliquis.    Patient does have history of GI bleed.  His platelets today are 46.  Will defer to hospitalist team.  Discussed with hospitalist.  Will accept patient for admission.  Updated patient on plan.  He is agreeable.  1. AKI (acute kidney injury) ([place])   2. Alcohol-induced acute pancreatitis, unspecified complication status     Portions of this note were generated with [employer]. Dictation errors may occur despite best attempts at proofreading.    Volanda Napoleon, PA-C 03/15/20 1846    Truddie Hidden, MD 03/15/20 2215

## 2020-03-15 NOTE — H&P (Signed)
[facility] History and Physical  Clifford [name] [id number] DOB: Nov 26, 1950 DOA: 03/15/2020  Referring physician: Dr. Karle Starch PCP: [name], FNP   Chief Complaint: weakness, diarrhea  HPI: Clifford [name] is a 69 y.o. male with medical hx notable for alcohol use disorder, prostate cancer, HTN, thrombocytopenia, prior GI bleed, who presents with weakness and diarrhea.  Patient reports that he presented to hospital due to feeling weak, diarrhea Denies feeling feverish Denies abdominal pain, bloody or tarry stools, emesis (though does endorse mild nausea), burning or pain with urination, rash Denies cough, SOB, chest pain presently Denies HA, vision changes  Reports he drinks 1 pint of gin daily, last drink was Thursday (four days prior) Denies use of any other substances Denies severe withdrawal from EtOH, denies ICU admissions or intubations Stopped drinking several days ago because he did not feel well  Wife Clifford [name] contacted by phone at [phone] Per wife he has seemed confused and "off" over the past several days However states this has happened in the past This time she attributed it to him not eating well over the past few days She states he has been taking all of his medications as prescribed She confirms no EtOH since last Thursday   Review of Systems:  Pertinent positives and negative per HPI, all others reviewed and negative   Past Medical History:  Diagnosis Date  . Alcoholism ([place])   . Anemia   . High cholesterol   . Hypertension   . Prostate cancer ([place])   . Seizures ([place])    Past Surgical History:  Procedure Laterality Date  . BIOPSY  03/03/2020   Procedure: BIOPSY;  Surgeon: Rogene Houston, MD;  [facility];  Service: Endoscopy;;  gastric  . CIRCUMCISION    . COLONOSCOPY N/A 09/13/2016   Procedure: COLONOSCOPY;  Surgeon: Rogene Houston, MD;  [facility];  Service: Endoscopy;  Laterality: N/A;   2:15  . COLONOSCOPY N/A 04/06/2018   Procedure: COLONOSCOPY;  Surgeon: Rogene Houston, MD;  [facility];  Service: Endoscopy;  Laterality: N/A;  . ESOPHAGOGASTRODUODENOSCOPY (EGD) WITH PROPOFOL N/A 03/03/2020   Procedure: ESOPHAGOGASTRODUODENOSCOPY (EGD) WITH PROPOFOL;  Surgeon: Rogene Houston, MD;  [facility];  Service: Endoscopy;  Laterality: N/A;  . FLEXIBLE SIGMOIDOSCOPY N/A 01/10/2018   Procedure: FLEXIBLE SIGMOIDOSCOPY;  Surgeon: Rogene Houston, MD;  [facility];  Service: Endoscopy;  Laterality: N/A;  1:55  . FLEXIBLE SIGMOIDOSCOPY N/A 06/08/2018   Procedure: FLEXIBLE SIGMOIDOSCOPY;  Surgeon: Danie Binder, MD;  [facility];  Service: Endoscopy;  Laterality: N/A;  . HEMORRHOID SURGERY N/A 04/09/2018   Procedure: HEMORRHOIDECTOMY;  Surgeon: Aviva Signs, MD;  Location: [facility];  Service: General;  Laterality: N/A;  . NO PAST SURGERIES    . POLYPECTOMY  04/06/2018   Procedure: POLYPECTOMY;  Surgeon: Rogene Houston, MD;  [facility];  Service: Endoscopy;;  sigmoid  . PROSTATE BIOPSY    . VIDEO ASSISTED THORACOSCOPY (VATS)/EMPYEMA Left 07/07/2018   Procedure: VIDEO ASSISTED THORACOSCOPY (VATS)/DRAINAGE OF EMPYEMA, DECORTICATION;  Surgeon: [name], MD;  Location: [facility];  Service: Thoracic;  Laterality: Left;   Social History:  reports that he has never smoked. He has never used smokeless tobacco. He reports current alcohol use. He reports that he does not use drugs.  Allergies  Allergen Reactions  . Lisinopril Swelling    Family History  Problem Relation Age of Onset  .  Alzheimer's disease Mother   . Hypertension Mother   . Hypertension Father   . Stroke Father   . Prostate cancer Father   . Hypertension Brother     Prior to Admission medications   Medication Sig Start Date End Date Taking? Authorizing Provider  folic acid (FOLVITE) 1 MG tablet Take 1 tablet (1 mg total) by mouth daily. 03/06/20  04/05/20 Yes Shah, Pratik D, DO  Magnesium Oxide 400 MG CAPS Take 1 capsule (400 mg total) by mouth daily. 03/05/20  Yes Shah, Pratik D, DO  pantoprazole (PROTONIX) 40 MG tablet Please take 40mg  (1 tablet) twice daily for 30 days and then 1 tablet daily thereafter. Patient taking differently: Take 40 mg by mouth 2 (two) times daily before a meal. Please take 40mg  (1 tablet) twice daily for 30 days and then 1 tablet daily thereafter. 03/05/20  Yes Shah, Pratik D, DO  thiamine 100 MG tablet Take 1 tablet (100 mg total) by mouth daily. 03/06/20  Yes Shah, Pratik D, DO  Multiple Vitamin (MULTIVITAMIN WITH MINERALS) TABS tablet Take 1 tablet by mouth daily. Patient not taking: Reported on 03/02/2020 12/10/19   Murlean Iba, MD   Physical Exam: Vitals:   03/15/20 1800 03/15/20 1900 03/15/20 1930 03/15/20 2100  BP: (!) 147/85 (!) 125/92 131/89 (!) 156/79  Pulse: 92   100  Resp: 16  16 16   Temp:      TempSrc:      SpO2: 99%  98% 95%  Weight:      Height:        Wt Readings from Last 3 Encounters:  03/15/20 61.2 kg  03/02/20 30 kg  02/07/20 65.8 kg    General:  Appears calm, NAD Eyes: PERRL, normal lids, irises & conjunctiva ENT: grossly normal hearing, lips & tongue Cardiovascular: RRR, no m/r/g. No LE edema. Respiratory: Mildly tachypneic. Focal crackles in mid to lower R lung. Abdomen: soft, ntnd, mildly enlarged liver Skin: no rash or induration seen on limited exam Musculoskeletal: grossly normal tone BUE/BLE Psychiatric: grossly normal mood and affect, speech fluent and appropriate, oriented to self but states place as "[facility] hospital" and unable to give year Neurologic: grossly non-focal.          Labs on Admission:  Basic Metabolic Panel: Recent Labs  Lab 03/15/20 1222  NA 132*  K 4.1  CL 97*  CO2 20*  GLUCOSE 137*  BUN 35*  CREATININE 2.44*  CALCIUM 8.2*   Liver Function Tests: Recent Labs  Lab 03/15/20 1222  AST 62*  ALT 26  ALKPHOS 71  BILITOT 2.9*   PROT 6.7  ALBUMIN 3.0*   Recent Labs  Lab 03/15/20 1222  LIPASE 655*   No results for input(s): AMMONIA in the last 168 hours. CBC: Recent Labs  Lab 03/15/20 1222  WBC 9.8  HGB 9.9*  HCT 31.5*  MCV 95.7  PLT 46*   Cardiac Enzymes: No results for input(s): CKTOTAL, CKMB, CKMBINDEX, TROPONINI in the last 168 hours.  BNP (last 3 results) No results for input(s): BNP in the last 8760 hours.  ProBNP (last 3 results) No results for input(s): PROBNP in the last 8760 hours.  CBG: No results for input(s): GLUCAP in the last 168 hours.  Radiological Exams on Admission: CT ABDOMEN PELVIS W CONTRAST  Result Date: 03/15/2020 CLINICAL DATA:  Diarrhea. Abdominal tenderness. Elevated lipase. History of prostate cancer. EXAM: CT ABDOMEN AND PELVIS WITH CONTRAST TECHNIQUE: Multidetector CT imaging of the abdomen and pelvis was  performed using the standard protocol following bolus administration of intravenous contrast. CONTRAST:  55mL OMNIPAQUE IOHEXOL 300 MG/ML  SOLN COMPARISON:  None. FINDINGS: Lower chest: Collapse/consolidative changes noted in the lower lobes bilaterally, left greater than right. No substantial pleural effusion. Hepatobiliary: The liver shows diffusely decreased attenuation suggesting fat deposition. No suspicious focal abnormality within the liver parenchyma. There is no evidence for gallstones, gallbladder wall thickening, or pericholecystic fluid. No intrahepatic or extrahepatic biliary dilation. Pancreas: Subtle peripancreatic edema evident with either irregular substantial dilatation of the pancreatic duct in the tail of pancreas versus cystic pancreatic lesion measuring on the order of 3.1 x 1.8 cm. Spleen: Edema noted in the splenic hilum. Spleen otherwise unremarkable. Adrenals/Urinary Tract: No adrenal nodule or mass. Right kidney unremarkable. 10 mm hypoattenuating lesion noted lower pole left kidney with attenuation too high to be a simple cyst. No evidence for  hydroureter. The urinary bladder appears normal for the degree of distention. Stomach/Bowel: Stomach is nondistended. 2.2 x 1.4 cm soft tissue lesion or fluid collection involving the posterior wall of the gastric fundus appears to communicate with the pancreatic tail lesion. Duodenum is normally positioned as is the ligament of Treitz. No small bowel wall thickening. No small bowel dilatation. The terminal ileum is normal. The appendix is best seen on coronal images and is unremarkable. No gross colonic mass. No colonic wall thickening. Vascular/Lymphatic: No abdominal aortic aneurysm. No abdominal aortic atherosclerotic calcification. There is no gastrohepatic or hepatoduodenal ligament lymphadenopathy. No retroperitoneal or mesenteric lymphadenopathy. 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 Splenic vein may well be occluded. Reproductive: Fiducial markers noted in the prostate gland. Other: No intraperitoneal free fluid. Musculoskeletal: No worrisome lytic or sclerotic osseous abnormality. IMPRESSION: 1. Thrombus is identified in the portosplenic confluence, involving the superior mesenteric vein and portal vein. Splenic vein may well be occluded. 2. Subtle peripancreatic edema with either irregular substantial dilatation of the pancreatic duct in the tail of pancreas versus ill-defined cystic or hypoenhancing pancreatic lesion measuring on the order of 3.1 x 1.8 cm. Imaging features raise concern for pancreatic neoplasm although this could simply represent pancreatitis with parenchymal pseudocyst. Abdominal MRI without and with contrast may prove helpful to further evaluate. 3. 2.2 x 1.4 cm soft tissue lesion or fluid collection involving the posterior wall of the gastric fundus appears to communicate with the pancreatic tail lesion. 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 4. Collapse/consolidative changes in the lower lobes bilaterally, left  greater than right. 5. Hepatic steatosis. 6. 10 mm hypoattenuating lesion lower pole left kidney with attenuation too high to be a simple cyst. This may be a cyst complicated by proteinaceous debris or hemorrhage, but neoplasm cannot be excluded. This could also be further evaluated the time of follow-up MRI. Electronically Signed   By: Misty Stanley M.D.   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 Discussed by ED with heme/onc who recommend anticoagulation. Patient is very complex and decision  to anticoagulate is finely balanced. Currently platelets are 46, see separate problem for discussion of etiology. Has long hx of GI bleeds with admission for the same only two weeks ago, however reassuringly had EGD with no varices on 03/03/2020 (though did have non bleeding gastric ulcers w/o stigmata of bleeding) and was placed on PPI therapy to which he and his wife endorse compliance. Discussed risks and benefits and he wishes to proceed with anticoagulation. Given some confusion present on my exam and noted by wife as well in setting of thrombocytopenia will obtain CT head to r/o intracranial bleeding prior to starting anticoagulation.  - start heparin gtt pending CT head result, plan of care signed out to Dr. Darrick Meigs  #Pancreatitis #?Pancreatic mass vs pseudocyst Lipase elevated to 655 and consistent imaging findings in setting of known EtOH use disorder. Some c/f  possible pancreatic mass w ?extension into gastric fundus vs pseudocyst on imaging as well. - LR @ 150 cc/hr - NPO, ADAT - MRI once AKI is resolved to avoid further kidney injury  #Sepsis Presenting with fever, mild tachycardia, CT showing consolidative changes at lung bases and CXR showing lower lobe findings (my read) c/w sepsis secondary to PNA.  - started on CTX and flagyl in ED> CTX+Azithromycin x5d - lactic acid normal on initial check and recheck - BCx x2 pending - UCx pending  #Thrombocytopenia Likely secondary to combination of EtOH use, liver disease and splenic sequestration in setting of new clot burden. Has been low over many years but prior nadir 88 in 2019. Infection/sepsis may also be playing a role. Possible new malignancy is a consideration as well. Unlikely HIT as he was not exposed to heparin during last admission on review of MAR. TTP/HUS unlikely given stable anemia and other more likely etiologies but should be considered if platelet count continues to drop. Low suspicion for DIC but check coags.  - trend closely - peripheral smear - check coags, fibrinogen - Heme consult in AM  #AKI Unclear baseline anywhere from 1-1.2 in the past, suspect hypovolemic, rehydrate per pancreatitis treatment and trend.   #Etoh Use Disorder Reports no hx of severe withdrawal, born out in review of chart. Patient and wife also report no EtOH for past four days, well outside window of severe withdrawal but will watch closely. - CIWA w ativan - cont home multivitamin, thiamine, folic acid  #Anemia Stable, known hx of chronic GI bleeds - PPI BID - trend CBC  #Hyponatremia Mild at 132, likely hypovolemic, trend.   #Transamnitis #Hyperbiliburinemia Mildly elevated AST c/w known EtOH use, mildly elevated bili of 2.9 with no evidence of gallstone disease, ?element of hypovoelmia vs sequelae of pancreatitis. Currently without pain and no acute imaging findings related to biliary  system, regardless will be obtaining MRI for further evaluation.   #Encephalopathy Patient appears confused though difficult to know what his baseline is, per wife it fluctuates. CT head obtained, read pending. May be secondary to sepsis vs pancreatitis vs withdrawal vs metabolic abnormalities. - treatment of predisposing factors as above - check TSH, B12 - UDS  #Renal mass Seen on CT A/P, cyst vs possible neoplasm per read, needs MRI follow up.    Code Status: Full code (not confirmed, c/w priors) DVT Prophylaxis: likely full dose AC pending CT head read Family Communication: Ottie Lindenbaum [phone] Disposition Plan: Inpatient  Time spent: 80 min  Clarnce Flock MD/[facility]

## 2020-03-16 DIAGNOSIS — D649 Anemia, unspecified: Secondary | ICD-10-CM

## 2020-03-16 DIAGNOSIS — I81 Portal vein thrombosis: Secondary | ICD-10-CM

## 2020-03-16 DIAGNOSIS — A419 Sepsis, unspecified organism: Secondary | ICD-10-CM | POA: Diagnosis not present

## 2020-03-16 DIAGNOSIS — F1020 Alcohol dependence, uncomplicated: Secondary | ICD-10-CM

## 2020-03-16 DIAGNOSIS — D696 Thrombocytopenia, unspecified: Secondary | ICD-10-CM

## 2020-03-16 DIAGNOSIS — G9341 Metabolic encephalopathy: Secondary | ICD-10-CM

## 2020-03-16 DIAGNOSIS — Z515 Encounter for palliative care: Secondary | ICD-10-CM

## 2020-03-16 DIAGNOSIS — N179 Acute kidney failure, unspecified: Secondary | ICD-10-CM | POA: Diagnosis not present

## 2020-03-16 DIAGNOSIS — K859 Acute pancreatitis without necrosis or infection, unspecified: Secondary | ICD-10-CM

## 2020-03-16 DIAGNOSIS — R197 Diarrhea, unspecified: Secondary | ICD-10-CM

## 2020-03-16 DIAGNOSIS — R6520 Severe sepsis without septic shock: Secondary | ICD-10-CM

## 2020-03-16 DIAGNOSIS — Z7189 Other specified counseling: Secondary | ICD-10-CM

## 2020-03-16 LAB — COMPREHENSIVE METABOLIC PANEL
ALT: 23 U/L (ref 0–44)
AST: 58 U/L — ABNORMAL HIGH (ref 15–41)
Albumin: 2.7 g/dL — ABNORMAL LOW (ref 3.5–5.0)
Alkaline Phosphatase: 70 U/L (ref 38–126)
Anion gap: 15 (ref 5–15)
BUN: 34 mg/dL — ABNORMAL HIGH (ref 8–23)
CO2: 19 mmol/L — ABNORMAL LOW (ref 22–32)
Calcium: 7.8 mg/dL — ABNORMAL LOW (ref 8.9–10.3)
Chloride: 102 mmol/L (ref 98–111)
Creatinine, Ser: 1.92 mg/dL — ABNORMAL HIGH (ref 0.61–1.24)
GFR calc Af Amer: 40 mL/min — ABNORMAL LOW (ref >60)
GFR calc non Af Amer: 35 mL/min — ABNORMAL LOW (ref >60)
Glucose, Bld: 105 mg/dL — ABNORMAL HIGH (ref 70–99)
Potassium: 4 mmol/L (ref 3.5–5.1)
Sodium: 136 mmol/L (ref 135–145)
Total Bilirubin: 2.5 mg/dL — ABNORMAL HIGH (ref 0.3–1.2)
Total Protein: 6.3 g/dL — ABNORMAL LOW (ref 6.5–8.1)

## 2020-03-16 LAB — BLOOD CULTURE ID PANEL (REFLEXED)

## 2020-03-16 LAB — LIPASE, BLOOD: Lipase: 820 U/L — ABNORMAL HIGH (ref 11–51)

## 2020-03-16 LAB — CBC
HCT: 31.1 % — ABNORMAL LOW (ref 39.0–52.0)
Hemoglobin: 9.5 g/dL — ABNORMAL LOW (ref 13.0–17.0)
MCH: 29.7 pg (ref 26.0–34.0)
MCHC: 30.5 g/dL (ref 30.0–36.0)
MCV: 97.2 fL (ref 80.0–100.0)
Platelets: 46 10*3/uL — ABNORMAL LOW (ref 150–400)
RBC: 3.2 MIL/uL — ABNORMAL LOW (ref 4.22–5.81)
RDW: 15.1 % (ref 11.5–15.5)
WBC: 9.2 10*3/uL (ref 4.0–10.5)
nRBC: 0 % (ref 0.0–0.2)

## 2020-03-16 LAB — PROTIME-INR
INR: 1.2 (ref 0.8–1.2)
Prothrombin Time: 15.2 seconds (ref 11.4–15.2)

## 2020-03-16 LAB — APTT: aPTT: 78 seconds — ABNORMAL HIGH (ref 24–36)

## 2020-03-16 LAB — TSH: TSH: 3.8 u[IU]/mL (ref 0.350–4.500)

## 2020-03-16 LAB — HEPARIN LEVEL (UNFRACTIONATED)
Heparin Unfractionated: 0.11 IU/mL — ABNORMAL LOW (ref 0.30–0.70)
Heparin Unfractionated: 0.14 IU/mL — ABNORMAL LOW (ref 0.30–0.70)

## 2020-03-16 LAB — GLUCOSE, CAPILLARY: Glucose-Capillary: 105 mg/dL — ABNORMAL HIGH (ref 70–99)

## 2020-03-16 LAB — VITAMIN B12: Vitamin B-12: 732 pg/mL (ref 180–914)

## 2020-03-16 MED ORDER — HYDROMORPHONE HCL 1 MG/ML IJ SOLN: 0.5000 mg | INTRAMUSCULAR | Status: DC | PRN

## 2020-03-16 MED ORDER — SODIUM CHLORIDE 0.9 % IV SOLN: 1.0000 g | Freq: Two times a day (BID) | INTRAVENOUS | Status: DC

## 2020-03-16 MED ORDER — HEPARIN (PORCINE) 25000 UT/250ML-% IV SOLN
1350.0000 [IU]/h | INTRAVENOUS | Status: AC
  Administered 2020-03-16: 850 [IU]/h via INTRAVENOUS
  Administered 2020-03-17 – 2020-03-18 (×2): 1100 [IU]/h via INTRAVENOUS
  Administered 2020-03-19: 23:00:00 1350 [IU]/h via INTRAVENOUS
  Administered 2020-03-19: 01:00:00 1100 [IU]/h via INTRAVENOUS
  Administered 2020-03-20 – 2020-03-22 (×3): 1350 [IU]/h via INTRAVENOUS
  Filled 2020-03-16 (×8): qty 250

## 2020-03-16 MED ORDER — SODIUM CHLORIDE 0.9 % IV SOLN
2.0000 g | INTRAVENOUS | Status: AC
  Administered 2020-03-16 – 2020-03-19 (×4): 2 g via INTRAVENOUS
  Filled 2020-03-16 (×4): qty 20

## 2020-03-16 NOTE — Consult Note (Signed)
[facility] Consultation Oncology  Name: Clifford [name]      MRN: [id number]    Location: A317/A317-01  Date: 03/16/2020 Time:5:09 PM   REFERRING PHYSICIAN: Dr. Wynetta Emery  REASON FOR CONSULT: Thrombocytopenia   DIAGNOSIS: Low platelet count secondary to sepsis  HISTORY OF PRESENT ILLNESS: Clifford [name] is a 69 year old African-American male who is seen in consultation today for further work-up and management of acute severe thrombocytopenia.  He presented to the hospital on 03/15/2020 with weakness and was found to have fever of 101.3.  His platelet count was low at 46 with a white count of 9.8 and hemoglobin 9.9.  Prior to that his platelet count was 115 on 03/05/2020.  He has history of alcohol abuse and his liver enzymes were elevated with elevated lipase.  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  Peripancreatic edema with irregular substantial dilation of the pancreatic duct in the tail of the pancreas versus ill-defined cystic or hypoenhancing pancreatic lesion measuring on the order of 3.1 x 1.8 cm.  Differential includes pancreatitis with parenchymal pseudocyst versus neoplasm.  2.2 x 1.4 cm soft tissue lesion/fluid collection involving the posterior wall of the gastric fundus appears to communicate with the pancreatic tail lesion.  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  Collapse/consolidative changes in the lower lobes bilaterally, left greater than right.  Hepatic steatosis was seen.  10 mm hypoattenuating lesion in the lower pole of the left kidney with attenuation too high to be a cyst.  He reports drinking a pint of gin every day for the past 5 years.  He was also recently admitted to the hospital with suspected GI bleed and had endoscopy done.  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  He has not reported any bleeding since the start of heparin.  He denies severe abdominal pain.  He thinks he might have lost some weight in the last month or so.  Lives at home with his wife.  He worked in a [employer].  He has been drinking alcohol since his 25s.  He was never smoker.  No family history of malignancies.  He reportedly had diarrhea since Thursday and has not had any alcoholic drink since Thursday of last week.  PAST MEDICAL HISTORY:   Past Medical History:  Diagnosis Date  . Alcoholism ([place])   . Anemia   . High cholesterol   . Hypertension   . Prostate cancer ([place])   . Seizures ([facility])     ALLERGIES: Allergies  Allergen Reactions  . Lisinopril Swelling      MEDICATIONS: I have reviewed the patient's current medications.     PAST SURGICAL HISTORY Past Surgical History:  Procedure Laterality Date  . BIOPSY  03/03/2020   Procedure: BIOPSY;  Surgeon: Rogene Houston, MD;  [facility];  Service: Endoscopy;;  gastric  . CIRCUMCISION    . COLONOSCOPY N/A 09/13/2016   Procedure: COLONOSCOPY;  Surgeon: Rogene Houston, MD;  [facility];  Service: Endoscopy;  Laterality: N/A;  2:15  . COLONOSCOPY N/A 04/06/2018   Procedure: COLONOSCOPY;  Surgeon: Rogene Houston, MD;  [facility];  Service: Endoscopy;  Laterality: N/A;  . ESOPHAGOGASTRODUODENOSCOPY (EGD) WITH PROPOFOL N/A 03/03/2020   Procedure: ESOPHAGOGASTRODUODENOSCOPY (EGD) WITH PROPOFOL;  Surgeon: Rogene Houston, MD;  Location: [facility]  END[facility];  Service: Endoscopy;  Laterality: N/A;  . FLEXIBLE SIGMOIDOSCOPY N/A 01/10/2018   Procedure: FLEXIBLE SIGMOIDOSCOPY;  Surgeon: Rogene Houston, MD;  [facility];  Service: Endoscopy;  Laterality: N/A;  1:55  . FLEXIBLE SIGMOIDOSCOPY N/A 06/08/2018   Procedure: FLEXIBLE SIGMOIDOSCOPY;  Surgeon: Danie Binder, MD;  Location[facility];  Service: Endoscopy;  Laterality: N/A;  . HEMORRHOID SURGERY N/A 04/09/2018   Procedure:  HEMORRHOIDECTOMY;  Surgeon: Aviva Signs, MD;  Location: [facility] ORS;  Service: General;  Laterality: N/A;  . NO PAST SURGERIES    . POLYPECTOMY  04/06/2018   Procedure: POLYPECTOMY;  Surgeon: Rogene Houston, MD;  Location: [facility];  Service: Endoscopy;;  sigmoid  . PROSTATE BIOPSY    . VIDEO ASSISTED THORACOSCOPY (VATS)/EMPYEMA Left 07/07/2018   Procedure: VIDEO ASSISTED THORACOSCOPY (VATS)/DRAINAGE OF EMPYEMA, DECORTICATION;  Surgeon: [name], MD;  Location: [facility] OR;  Service: Thoracic;  Laterality: Left;    FAMILY HISTORY: Family History  Problem Relation Age of Onset  . Alzheimer's disease Mother   . Hypertension Mother   . Hypertension Father   . Stroke Father   . Prostate cancer Father   . Hypertension Brother     SOCIAL HISTORY:  reports that he has never smoked. He has never used smokeless tobacco. He reports current alcohol use. He reports that he does not use drugs.  PERFORMANCE STATUS: The patient's performance status is 2 - Symptomatic, <50% confined to bed  PHYSICAL EXAM: Most Recent Vital Signs: Blood pressure 132/74, pulse 99, temperature 98.3 F (36.8 C), resp. rate 20, height 5\' 6"  (1.676 m), weight 135 lb (61.2 kg), SpO2 96 %. BP 132/74 (BP Location: Right Arm)   Pulse 99   Temp 98.3 F (36.8 C)   Resp 20   Ht 5\' 6"  (1.676 m)   Wt 135 lb (61.2 kg)   SpO2 96%   BMI 21.79 kg/m  General appearance: alert, cooperative and appears stated age Neck: no adenopathy and supple, symmetrical, trachea midline Lungs: Bilateral air entry with decreased breath sounds. Heart: regular rate and rhythm Abdomen: Soft, vague tenderness in the epigastric and left upper quadrant region.  No masses palpable. Extremities: No edema or cyanosis. Skin: Skin color, texture, turgor normal. No rashes or lesions Lymph nodes: Cervical, supraclavicular, and axillary nodes normal. Neurologic: Grossly normal  LABORATORY DATA:  Results for orders placed or performed during  the hospital encounter of 03/15/20 (from the past 48 hour(s))  Lipase, blood     Status: Abnormal   Collection Time: 03/15/20 12:22 PM  Result Value Ref Range   Lipase 655 (H) 11 - 51 U/L    Comment: RESULTS CONFIRMED BY MANUAL DILUTION Performed at [facility], [address] [place], [place] [address]   Comprehensive metabolic panel     Status: Abnormal   Collection Time: 03/15/20 12:22 PM  Result Value Ref Range   Sodium 132 (L) 135 - 145 mmol/L   Potassium 4.1 3.5 - 5.1 mmol/L   Chloride 97 (L) 98 - 111 mmol/L   CO2 20 (L) 22 - 32 mmol/L   Glucose, Bld 137 (H) 70 - 99 mg/dL    Comment: Glucose reference range applies only to samples taken after fasting for at least 8 hours.   BUN 35 (H) 8 - 23 mg/dL   Creatinine, Ser 2.44 (H) 0.61 - 1.24 mg/dL   Calcium 8.2 (L) 8.9 - 10.3 mg/dL   Total Protein 6.7 6.5 - 8.1 g/dL  Albumin 3.0 (L) 3.5 - 5.0 g/dL   AST 62 (H) 15 - 41 U/L   ALT 26 0 - 44 U/L   Alkaline Phosphatase 71 38 - 126 U/L   Total Bilirubin 2.9 (H) 0.3 - 1.2 mg/dL   GFR calc non Af Amer 26 (L) >60 mL/min   GFR calc Af Amer 30 (L) >60 mL/min   Anion gap 15 5 - 15    Comment: Performed at [facility], [address], [place], [place] [address]  CBC     Status: Abnormal   Collection Time: 03/15/20 12:22 PM  Result Value Ref Range   WBC 9.8 4.0 - 10.5 K/uL   RBC 3.29 (L) 4.22 - 5.81 MIL/uL   Hemoglobin 9.9 (L) 13.0 - 17.0 g/dL   HCT 31.5 (L) 39.0 - 52.0 %   MCV 95.7 80.0 - 100.0 fL   MCH 30.1 26.0 - 34.0 pg   MCHC 31.4 30.0 - 36.0 g/dL   RDW 15.2 11.5 - 15.5 %   Platelets 46 (L) 150 - 400 K/uL    Comment: SPECIMEN CHECKED FOR CLOTS PLATELET COUNT CONFIRMED BY SMEAR    nRBC 0.0 0.0 - 0.2 %    Comment: Performed at [facility], [address] [place],  [address]  Respiratory Panel by RT PCR (Flu A&B, Covid) - Nasopharyngeal Swab     Status: None   Collection Time: 03/15/20  3:52 PM   Specimen: Nasopharyngeal Swab  Result Value Ref Range   SARS Coronavirus  2 by RT PCR NEGATIVE NEGATIVE    Comment: (NOTE) SARS-CoV-2 target nucleic acids are NOT DETECTED. The SARS-CoV-2 RNA is generally detectable in upper respiratoy specimens during the acute phase of infection. The lowest concentration of SARS-CoV-2 viral copies this assay can detect is 131 copies/mL. A negative result does not preclude SARS-Cov-2 infection and should not be used as the sole basis for treatment or other patient management decisions. A negative result may occur with  improper specimen collection/handling, submission of specimen other than nasopharyngeal swab, presence of viral mutation(s) within the areas targeted by this assay, and inadequate number of viral copies (<131 copies/mL). A negative result must be combined with clinical observations, patient history, and epidemiological information. The expected result is Negative. Fact Sheet for Patients:  PinkCheek.be Fact Sheet for Healthcare Providers:  GravelBags.it This test is not yet ap proved or cleared by the [place] FDA and  has been authorized for detection and/or diagnosis of SARS-CoV-2 by FDA under an Emergency Use Authorization (EUA). This EUA will remain  in effect (meaning this test can be used) for the duration of the COVID-19 declaration under Section 564(b)(1) of the Act, 21 U.S.C. section 360bbb-3(b)(1), unless the authorization is terminated or revoked sooner.    Influenza A by PCR NEGATIVE NEGATIVE   Influenza B by PCR NEGATIVE NEGATIVE    Comment: (NOTE) The Xpert Xpress SARS-CoV-2/FLU/RSV assay is intended as an aid in  the diagnosis of influenza from Nasopharyngeal swab specimens and  should not be used as a sole basis for treatment. Nasal washings and  aspirates are unacceptable for Xpert Xpress SARS-CoV-2/FLU/RSV  testing. Fact Sheet for Patients: PinkCheek.be Fact Sheet for Healthcare  Providers: GravelBags.it This test is not yet approved or cleared by the [place] FDA and  has been authorized for detection and/or diagnosis of SARS-CoV-2 by  FDA under an Emergency Use Authorization (EUA). This EUA will remain  in effect (meaning this test can be used) for the duration  of the  Covid-19 declaration under Section 564(b)(1) of the Act, 21  U.S.C. section 360bbb-3(b)(1), unless the authorization is  terminated or revoked. Performed at [facility], [address], [place], [place] [address]   Lactic acid, plasma     Status: None   Collection Time: 03/15/20  3:52 PM  Result Value Ref Range   Lactic Acid, Venous 1.8 0.5 - 1.9 mmol/L    Comment: Performed at [facility], [address], [place], [place] [address]  Blood culture (routine x 2)     Status: None (Preliminary result)   Collection Time: 03/15/20  3:52 PM   Specimen: BLOOD RIGHT WRIST  Result Value Ref Range   Specimen Description      BLOOD RIGHT WRIST Performed at [facility], [address], [place], [place] [address]    Special Requests      BOTTLES DRAWN AEROBIC AND ANAEROBIC Blood Culture adequate volume Performed at [facility], [address] [place], [place] [address]    Culture  Setup Time      IN BOTH AEROBIC AND ANAEROBIC BOTTLES GRAM NEGATIVE RODS Gram Stain Report Called to,Read Back By and Verified With: T VILLALOBOS,RN @0433  03/16/20 MKELLY Organism ID to follow Performed at [facility], [place] [address], [place], [place] [address]    Culture GRAM NEGATIVE RODS    Report Status PENDING   Blood Culture ID Panel (Reflexed)     Status: Abnormal   Collection Time: 03/15/20  3:52 PM  Result Value Ref Range   Enterococcus species NOT DETECTED NOT DETECTED   Listeria monocytogenes NOT DETECTED NOT DETECTED   Staphylococcus species NOT DETECTED NOT DETECTED   Staphylococcus aureus (BCID) NOT DETECTED NOT DETECTED   Streptococcus species NOT DETECTED NOT  DETECTED   Streptococcus agalactiae NOT DETECTED NOT DETECTED   Streptococcus pneumoniae NOT DETECTED NOT DETECTED   Streptococcus pyogenes NOT DETECTED NOT DETECTED   Acinetobacter baumannii NOT DETECTED NOT DETECTED   Enterobacteriaceae species DETECTED (A) NOT DETECTED    Comment: Enterobacteriaceae represent a large family of gram-negative bacteria, not a single organism. CRITICAL RESULT CALLED TO, READ BACK BY AND VERIFIED WITH: Deborra Medina PharmD 9:10 03/16/20 (wilsonm)    Enterobacter cloacae complex NOT DETECTED NOT DETECTED   Escherichia coli NOT DETECTED NOT DETECTED   Klebsiella oxytoca NOT DETECTED NOT DETECTED   Klebsiella pneumoniae DETECTED (A) NOT DETECTED    Comment: CRITICAL RESULT CALLED TO, READ BACK BY AND VERIFIED WITH: Deborra Medina PharmD 9:10 03/16/20 (wilsonm)    Proteus species NOT DETECTED NOT DETECTED   Serratia marcescens NOT DETECTED NOT DETECTED   Carbapenem resistance NOT DETECTED NOT DETECTED   Haemophilus influenzae NOT DETECTED NOT DETECTED   Neisseria meningitidis NOT DETECTED NOT DETECTED   Pseudomonas aeruginosa NOT DETECTED NOT DETECTED   Candida albicans NOT DETECTED NOT DETECTED   Candida glabrata NOT DETECTED NOT DETECTED   Candida krusei NOT DETECTED NOT DETECTED   Candida parapsilosis NOT DETECTED NOT DETECTED   Candida tropicalis NOT DETECTED NOT DETECTED    Comment: Performed at [facility] [address] [place], [place] [address]  Blood culture (routine x 2)     Status: None (Preliminary result)   Collection Time: 03/15/20  3:53 PM   Specimen: BLOOD RIGHT HAND  Result Value Ref Range   Specimen Description      BLOOD RIGHT HAND BOTTLES DRAWN AEROBIC AND ANAEROBIC Performed at [facility], [address] [place],  [address]  Special Requests      Blood Culture adequate volume Performed at [facility], [address], [place], [place] [address]    Culture  Setup Time      IN BOTH AEROBIC AND ANAEROBIC BOTTLES GRAM  NEGATIVE RODS Gram Stain Report Called to,Read Back By and Verified With: T VILLALOBOS,RN @0432  03/16/20 MKELLY Performed at [facility], [address] [place], [place] [address]    Culture GRAM NEGATIVE RODS    Report Status PENDING   Lactic acid, plasma     Status: None   Collection Time: 03/15/20  5:57 PM  Result Value Ref Range   Lactic Acid, Venous 1.6 0.5 - 1.9 mmol/L    Comment: Performed at [facility], [address], [place], [place] [address]  CBC     Status: Abnormal   Collection Time: 03/15/20 10:23 PM  Result Value Ref Range   WBC 8.7 4.0 - 10.5 K/uL   RBC 2.91 (L) 4.22 - 5.81 MIL/uL   Hemoglobin 8.7 (L) 13.0 - 17.0 g/dL   HCT 27.4 (L) 39.0 - 52.0 %   MCV 94.2 80.0 - 100.0 fL   MCH 29.9 26.0 - 34.0 pg   MCHC 31.8 30.0 - 36.0 g/dL   RDW 15.0 11.5 - 15.5 %   Platelets 37 (L) 150 - 400 K/uL    Comment: CONSISTENT WITH PREVIOUS RESULT Immature Platelet Fraction may be clinically indicated, consider ordering this additional test [id number]    nRBC 0.0 0.0 - 0.2 %    Comment: Performed at [facility], [address], [place], [place] [address]  Protime-INR     Status: Abnormal   Collection Time: 03/15/20 10:23 PM  Result Value Ref Range   Prothrombin Time 15.8 (H) 11.4 - 15.2 seconds   INR 1.3 (H) 0.8 - 1.2    Comment: (NOTE) INR goal varies based on device and disease states. Performed at [facility], [address] [place], [place] [address]   APTT     Status: Abnormal   Collection Time: 03/15/20 10:23 PM  Result Value Ref Range   aPTT 45 (H) 24 - 36 seconds    Comment:        IF BASELINE aPTT IS ELEVATED, SUGGEST PATIENT RISK ASSESSMENT BE USED TO DETERMINE APPROPRIATE ANTICOAGULANT THERAPY. Performed at [facility], [address] [place], [place] [address]   TSH     Status: Abnormal   Collection Time: 03/15/20 10:23 PM  Result Value Ref Range   TSH 5.574 (H) 0.350 - 4.500 uIU/mL    Comment: Performed by a 3rd Generation assay with a functional  sensitivity of <=0.01 uIU/mL. Performed at [facility], [address], [place], [place] [address]   Vitamin B12     Status: None   Collection Time: 03/15/20 10:23 PM  Result Value Ref Range   Vitamin B-12 732 180 - 914 pg/mL    Comment: (NOTE) This assay is not validated for testing neonatal or myeloproliferative syndrome specimens for Vitamin B12 levels. Performed at [facility], [address], [place], [place] [address]   Fibrinogen     Status: None   Collection Time: 03/15/20 10:23 PM  Result Value Ref Range   Fibrinogen 390 210 - 475 mg/dL    Comment: Performed at [facility], [address] [place], [place] [address]  Save Smear     Status: None   Collection Time: 03/15/20 10:39 PM  Result Value Ref Range   Smear Review SMEAR STAINED AND AVAILABLE FOR  REVIEW     Comment: Performed at [facility], [address] [place], [place] [address]  Comprehensive metabolic panel     Status: Abnormal   Collection Time: 03/16/20  6:18 AM  Result Value Ref Range   Sodium 136 135 - 145 mmol/L   Potassium 4.0 3.5 - 5.1 mmol/L   Chloride 102 98 - 111 mmol/L   CO2 19 (L) 22 - 32 mmol/L   Glucose, Bld 105 (H) 70 - 99 mg/dL    Comment: Glucose reference range applies only to samples taken after fasting for at least 8 hours.   BUN 34 (H) 8 - 23 mg/dL   Creatinine, Ser 1.92 (H) 0.61 - 1.24 mg/dL   Calcium 7.8 (L) 8.9 - 10.3 mg/dL   Total Protein 6.3 (L) 6.5 - 8.1 g/dL   Albumin 2.7 (L) 3.5 - 5.0 g/dL   AST 58 (H) 15 - 41 U/L   ALT 23 0 - 44 U/L   Alkaline Phosphatase 70 38 - 126 U/L   Total Bilirubin 2.5 (H) 0.3 - 1.2 mg/dL   GFR calc non Af Amer 35 (L) >60 mL/min   GFR calc Af Amer 40 (L) >60 mL/min   Anion gap 15 5 - 15    Comment: Performed at [facility], [address], [place], [place] [address]  CBC     Status: Abnormal   Collection Time: 03/16/20  6:18 AM  Result Value Ref Range   WBC 9.2 4.0 - 10.5 K/uL   RBC 3.20 (L) 4.22 - 5.81 MIL/uL   Hemoglobin 9.5 (L) 13.0 - 17.0  g/dL   HCT 31.1 (L) 39.0 - 52.0 %   MCV 97.2 80.0 - 100.0 fL   MCH 29.7 26.0 - 34.0 pg   MCHC 30.5 30.0 - 36.0 g/dL   RDW 15.1 11.5 - 15.5 %   Platelets 46 (L) 150 - 400 K/uL    Comment: SPECIMEN CHECKED FOR CLOTS Immature Platelet Fraction may be clinically indicated, consider ordering this additional test [id number] PLATELET COUNT CONFIRMED BY SMEAR    nRBC 0.0 0.0 - 0.2 %    Comment: Performed at [facility], [address], [place], [place] [address]  Protime-INR     Status: None   Collection Time: 03/16/20  6:18 AM  Result Value Ref Range   Prothrombin Time 15.2 11.4 - 15.2 seconds   INR 1.2 0.8 - 1.2    Comment: (NOTE) INR goal varies based on device and disease states. Performed at [facility], [address], [place], [place] [address]   APTT     Status: Abnormal   Collection Time: 03/16/20  6:18 AM  Result Value Ref Range   aPTT 78 (H) 24 - 36 seconds    Comment:        IF BASELINE aPTT IS ELEVATED, SUGGEST PATIENT RISK ASSESSMENT BE USED TO DETERMINE APPROPRIATE ANTICOAGULANT THERAPY. Performed at [facility], [address], [place], [place] [address]   TSH     Status: None   Collection Time: 03/16/20  6:18 AM  Result Value Ref Range   TSH 3.800 0.350 - 4.500 uIU/mL    Comment: Performed by a 3rd Generation assay with a functional sensitivity of <=0.01 uIU/mL. Performed at [facility], [address], [place], [place] [address]   Lipase, blood     Status: Abnormal   Collection Time: 03/16/20  6:18 AM  Result Value Ref Range   Lipase 820 (H) 11 - 51 U/L  Comment: RESULTS CONFIRMED BY MANUAL DILUTION Performed at [facility], [address] [place], [place] [address]   Glucose, capillary     Status: Abnormal   Collection Time: 03/16/20  7:40 AM  Result Value Ref Range   Glucose-Capillary 105 (H) 70 - 99 mg/dL    Comment: Glucose reference range applies only to samples taken after fasting for at least 8 hours.  Heparin level (unfractionated)     Status:  Abnormal   Collection Time: 03/16/20  8:31 AM  Result Value Ref Range   Heparin Unfractionated 0.11 (L) 0.30 - 0.70 IU/mL    Comment: (NOTE) If heparin results are below expected values, and patient dosage has  been confirmed, suggest follow up testing of antithrombin III levels. Performed at [facility], [address], [place], [place] [address]   Heparin level (unfractionated)     Status: Abnormal   Collection Time: 03/16/20  4:07 PM  Result Value Ref Range   Heparin Unfractionated 0.14 (L) 0.30 - 0.70 IU/mL    Comment: (NOTE) If heparin results are below expected values, and patient dosage has  been confirmed, suggest follow up testing of antithrombin III levels. Performed at [facility], [address] [place], [place] [address]       RADIOGRAPHY: DG Chest 2 View  Result Date: 03/15/2020 CLINICAL DATA:  [place] EXAM: CHEST - 2 VIEW COMPARISON:  03/04/2020 FINDINGS: Heart is normal size. Left lower lobe atelectasis or infiltrate noted. No confluent opacity on the right. No effusions or acute bony abnormality. IMPRESSION: Left basilar atelectasis or infiltrate. Electronically Signed   By: Rolm Baptise M.D.   On: 03/15/2020 21:40   CT HEAD WO CONTRAST  Result Date: 03/15/2020 CLINICAL DATA:  Encephalopathy EXAM: CT HEAD WITHOUT CONTRAST TECHNIQUE: Contiguous axial images were obtained from the base of the skull through the vertex without intravenous contrast. COMPARISON:  None. FINDINGS: Brain: There is atrophy and chronic small vessel disease changes. No acute intracranial abnormality. Specifically, no hemorrhage, hydrocephalus, mass lesion, acute infarction, or significant intracranial injury. Vascular: No hyperdense vessel or unexpected calcification. Skull: No acute calvarial abnormality. Sinuses/Orbits: Visualized paranasal sinuses and mastoids clear. Orbital soft tissues unremarkable. Other: None IMPRESSION: Atrophy, chronic microvascular disease. No acute intracranial abnormality.  Electronically Signed   By: Rolm Baptise M.D.   On: 03/15/2020 21:39   CT ABDOMEN PELVIS W CONTRAST  Result Date: 03/15/2020 CLINICAL DATA:  Diarrhea. Abdominal tenderness. Elevated lipase. History of prostate cancer. EXAM: CT ABDOMEN AND PELVIS WITH CONTRAST TECHNIQUE: Multidetector CT imaging of the abdomen and pelvis was performed using the standard protocol following bolus administration of intravenous contrast. CONTRAST:  16mL OMNIPAQUE IOHEXOL 300 MG/ML  SOLN COMPARISON:  None. FINDINGS: Lower chest: Collapse/consolidative changes noted in the lower lobes bilaterally, left greater than right. No substantial pleural effusion. Hepatobiliary: The liver shows diffusely decreased attenuation suggesting fat deposition. No suspicious focal abnormality within the liver parenchyma. There is no evidence for gallstones, gallbladder wall thickening, or pericholecystic fluid. No intrahepatic or extrahepatic biliary dilation. Pancreas: Subtle peripancreatic edema evident with either irregular substantial dilatation of the pancreatic duct in the tail of pancreas versus cystic pancreatic lesion measuring on the order of 3.1 x 1.8 cm. Spleen: Edema noted in the splenic hilum. Spleen otherwise unremarkable. Adrenals/Urinary Tract: No adrenal nodule or mass. Right kidney unremarkable. 10 mm hypoattenuating lesion noted lower pole left kidney with attenuation too high to be a simple cyst. No evidence for hydroureter. The urinary bladder appears normal for the degree of  distention. Stomach/Bowel: Stomach is nondistended. 2.2 x 1.4 cm soft tissue lesion or fluid collection involving the posterior wall of the gastric fundus appears to communicate with the pancreatic tail lesion. Duodenum is normally positioned as is the ligament of Treitz. No small bowel wall thickening. No small bowel dilatation. The terminal ileum is normal. The appendix is best seen on coronal images and is unremarkable. No gross colonic mass. No colonic wall  thickening. Vascular/Lymphatic: No abdominal aortic aneurysm. No abdominal aortic atherosclerotic calcification. There is no gastrohepatic or hepatoduodenal ligament lymphadenopathy. No retroperitoneal or mesenteric lymphadenopathy. 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 Splenic vein may well be occluded. Reproductive: Fiducial markers noted in the prostate gland. Other: No intraperitoneal free fluid. Musculoskeletal: No worrisome lytic or sclerotic osseous abnormality. IMPRESSION: 1. Thrombus is identified in the portosplenic confluence, involving the superior mesenteric vein and portal vein. Splenic vein may well be occluded. 2. Subtle peripancreatic edema with either irregular substantial dilatation of the pancreatic duct in the tail of pancreas versus ill-defined cystic or hypoenhancing pancreatic lesion measuring on the order of 3.1 x 1.8 cm. Imaging features raise concern for pancreatic neoplasm although this could simply represent pancreatitis with parenchymal pseudocyst. Abdominal MRI without and with contrast may prove helpful to further evaluate. 3. 2.2 x 1.4 cm soft tissue lesion or fluid collection involving the posterior wall of the gastric fundus appears to communicate with the pancreatic tail lesion. 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 4. Collapse/consolidative changes in the lower lobes bilaterally, left greater than right. 5. Hepatic steatosis. 6. 10 mm hypoattenuating lesion lower pole left kidney with attenuation too high to be a simple cyst. This may be a cyst complicated by proteinaceous debris or hemorrhage, but neoplasm cannot be excluded. This could also be further evaluated the time of follow-up MRI. Electronically Signed   By: Misty Stanley M.D.   On: 03/15/2020 17:04        ASSESSMENT and PLAN:  1.  Acute severe thrombocytopenia: -Patient with known history of alcohol abuse.  Presented with weakness and  fevers.  Had a fever of 101.3 at presentation. -Platelet count was 115 on 03/05/2020.  Platelet count on presentation was 46.  B12 level was normal.  Recent gastric biopsy was negative for H. pylori. -Differential diagnosis includes bone marrow toxicity from EtOH, and consumption from sepsis. -We will review smear.  We will check for folic acid, methylmalonic acid and copper levels.  We will also check for hepatitis B and C serology. -CT scan of the abdomen and pelvis on 03/15/2020 showed edema in the splenic hilum but otherwise unremarkable in size.  2.  Splanchnic vein thrombosis: -CT scan of the abdomen and pelvis on 03/15/2020 showed thrombus identified in the portal splenic confluence, involving the superior mesenteric vein and portal vein.  Splenic vein may also be occluded. -Likely etiology is chronic pancreatitis versus mass. -He was started on heparin intravenously.  We will closely monitor his platelet counts.  3.  Possible pancreatic mass: -Ill-defined cystic or hypoenhancing pancreatic lesion measuring in the order of 3.1 x 1.8 cm versus irregular dilatation of the pancreatic duct in the tail of the pancreas seen. -Would recommend a abdominal MRI with and without contrast, can be done as outpatient.  We will also check CA 19-9 levels.  4.  Sepsis/pneumonia: -He denies any cough.  Presentation with temperature 101.3. -CT scan showed collapse/consolidative changes in the lower lobes bilaterally, left greater than right. -He is  on azithromycin and ceftriaxone.  5.  Normocytic anemia: -Presentation with hemoglobin 9.9 MCV 95.7. -Recommend checking ferritin, iron panel. 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  There might be also component of blood loss. -EGD on 03/03/2020 showed normal esophagus, 2 cm hiatal hernia, gastritis, nonbleeding gastric ulcers with no stigmata of bleeding, normal duodenal bulb and second part of duodenum.  All questions were answered.  The patient knows to call the clinic with any problems, questions or concerns. We can certainly see the patient much sooner if necessary.    Derek Jack

## 2020-03-16 NOTE — Progress Notes (Signed)
ANTICOAGULATION CONSULT NOTE - Initial Consult  Pharmacy Consult for heparin Indication: portal vein thrombosis  Allergies  Allergen Reactions  . Lisinopril Swelling    Patient Measurements: Height: 5\' 6"  (167.6 cm) Weight: 61.2 kg (135 lb) IBW/kg (Calculated) : 63.8 Heparin Dosing Weight: 61.2 kg  Vital Signs: Temp: 101.3 F (38.5 C) (05/03 2259) Temp Source: Oral (05/03 2259) BP: 143/72 (05/03 2259) Pulse Rate: 94 (05/03 2259)  Labs: Recent Labs    03/15/20 1222 03/15/20 2223  HGB 9.9* 8.7*  HCT 31.5* 27.4*  PLT 46* 37*  APTT  --  45*  LABPROT  --  15.8*  INR  --  1.3*  CREATININE 2.44*  --     Estimated Creatinine Clearance: 24.7 mL/min (A) (by C-G formula based on SCr of 2.44 mg/dL (H)).   Medical History: Past Medical History:  Diagnosis Date  . Alcoholism ([place])   . Anemia   . High cholesterol   . Hypertension   . Prostate cancer ([place])   . Seizures ([place])     Medications:  See medication history  Assessment: 69 yo man with portal vein thrombosis to start heparin therapy.  His PTLC is very low, 37K, and he has a h/o GI bleed.  Okay with heme/onc to start as CT head is negative for bleed.  He was not on anticoagulation PTA. Goal of Therapy:  Heparin level 0.3-0.7 units/ml target lower end of goal Monitor platelets by anticoagulation protocol: Yes   Plan:  Start heparin drip with out bolus at 850 units/hr (14 units/kg/hr) Check heparin level 6-8 hours after start Daily HL and CBC Monitor for bleeding complications  Erwin Nishiyama Poteet 03/16/2020,12:27 AM

## 2020-03-16 NOTE — Progress Notes (Signed)
PHARMACY - PHYSICIAN COMMUNICATION CRITICAL VALUE ALERT - BLOOD CULTURE IDENTIFICATION (BCID)  Clifford [name] is an 69 y.o. male who presented to [facility] on 03/15/2020 with a chief complaint of  weakness and diarrhea.  Assessment:   2 of 2 blood cultures with [place] Name of physician (or Provider) Contacted: Dr. Wynetta Emery  Current antibiotics: ceftriaxone/azithromycin  Changes to prescribed antibiotics recommended:    No changes needed at this time; waiting for ID  No results found for this or any previous visit.  Despina Pole 03/16/2020  8:32 AM

## 2020-03-16 NOTE — Consult Note (Addendum)
Consultation Note Date: 03/16/2020   Patient Name: Clifford [name]  DOB: 01/22/1951  MRN: [id number]  Age / Sex: 69 y.o., male  PCP: [name], FNP Referring Physician: Murlean Iba, MD  Reason for Consultation: Establishing goals of care  HPI/Patient Profile: 69 y.o. male  with past medical history of ETOH abuse, HTN, prostate cancer, prior GI bleeding, thrombocytopenia, recent admit with EGD 03/03/20 no varices admitted on 03/15/2020 with diarrhea and weakness. Patient found to have pancreatitis vs. Pancreatic mass vs. Pseudocyst on imaging with known ETOH use. Will need MRI once AKI resolved. Imaging also revealed portal vein thrombosis, superior mesenteric vein thrombosis, and ? splenic vein thrombosis, heparin gtt started. CT head negative for acute findings. Sepsis secondary to consolidative changes on CT, started on antibiotics for possible pneumonia. Thrombocytopenia likely secondary to ETOH use, liver disease, and splenic clot burden. ETOH use patient reports last drink four days prior to admit. No signs of withdrawal. GI consult pending. Palliative medicine consultation for goals of care.   Clinical Assessment and Goals of Care: PMT consult received, chart reviewed, discussed with care team, and met with patient at bedside. Mr. Clifford [name] is awake, alert, oriented and able to participate in discussion. He denies pain or discomfort. He is in good spirits this morning but asking for water. Explained NPO status.  Patient is married, wife of 26 years! They have three children and grandchildren. 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 Active drinker, reports last drink Thursday. He admits to pint of gin about every 2 days.   Discussed in detail events leading up to admission and course of hospital diagnoses, interventions, plan of care. Allowed patient to  answer questions regarding plan of care. Discussed importance of sobriety moving forward with multiple complications related to ETOH use. He admits he needs to 'try to stop.' He denies withdrawal symptoms.   Patient does not have a documented living will. Briefly reviewed AD packet and encouraged him to discuss with wife. At this point, patient does desire life-saving measures such as resuscitation/life support if necessary.   Explained severity of clinical situation and need for further testing to determine whether this is pancreatitis versus mass/cyst. Answered questions and reassured of ongoing support.   **No family at bedside during visit. Spoke with wife, Clifford [name] this afternoon to provide update on diagnoses, interventions, plan of care. Explained AD packet that was left at bedside. Reassured of ongoing support this admit. Wife appreciative and hopeful for any interventions necessary to 'get him back on track.'    SUMMARY OF RECOMMENDATIONS    Continue full code/full scope. Patient desires life-saving measures if necessary.  Pending GI consult.  Per admitting physician, will need MRI once AKI resolved to determine ? Pancreatic mass versus pseudocyst.  Ongoing palliative support this admission pending clinical course.   AD packet reviewed with patient and left at bedside.  Code Status/Advance Care Planning:  Full code  Symptom Management:   Per attending  Palliative  Prophylaxis:   Aspiration, Delirium Protocol, Oral Care and Turn Reposition  Additional Recommendations (Limitations, Scope, Preferences):  Full Scope Treatment  Psycho-social/Spiritual:   Desire for further Chaplaincy support: yes  Additional Recommendations: Caregiving  Support/Resources  Prognosis:   Unable to determine  Discharge Planning: To Be Determined      Primary Diagnoses: Present on Admission: . Alcoholism ([place]) . Essential hypertension . Malignant neoplasm of prostate ([place]) .  Thrombocytopenia ([place]) . AKI (acute kidney injury) ([place]) . Abnormal LFTs (liver function tests) . Hyponatremia . Pancreatitis   I have reviewed the medical record, interviewed the patient and family, and examined the patient. The following aspects are pertinent.  Past Medical History:  Diagnosis Date  . Alcoholism ([place])   . Anemia   . High cholesterol   . Hypertension   . Prostate cancer ([place])   . Seizures ([place])    Social History   Socioeconomic History  . Marital status: Married    Spouse name: Not on file  . Number of children: Not on file  . Years of education: Not on file  . Highest education level: Not on file  Occupational History  . Not on file  Tobacco Use  . Smoking status: Never Smoker  . Smokeless tobacco: Never Used  Substance and Sexual Activity  . Alcohol use: Yes    Comment: 1-2 bottles of Gin every few days  . Drug use: No  . Sexual activity: Not on file  Other Topics Concern  . Not on file  Social History Narrative  . Not on file   Social Determinants of Health   Financial Resource Strain:   . Difficulty of Paying Living Expenses:   Food Insecurity:   . Worried About Charity fundraiser in the Last Year:   . Arboriculturist in the Last Year:   Transportation Needs:   . Film/video editor (Medical):   Marland Kitchen Lack of Transportation (Non-Medical):   Physical Activity:   . Days of Exercise per Week:   . Minutes of Exercise per Session:   Stress:   . Feeling of Stress :   Social Connections:   . Frequency of Communication with Friends and Family:   . Frequency of Social Gatherings with Friends and Family:   . Attends Religious Services:   . Active Member of Clubs or Organizations:   . Attends Archivist Meetings:   Marland Kitchen Marital Status:    Family History  Problem Relation Age of Onset  . Alzheimer's disease Mother   . Hypertension Mother   . Hypertension Father   . Stroke Father   . Prostate cancer Father   . Hypertension Brother     Scheduled Meds: . folic acid  1 mg Oral Daily  . magnesium oxide  400 mg Oral Daily  . multivitamin with minerals  1 tablet Oral Daily  . pantoprazole  40 mg Oral BID AC  . sodium chloride flush  3 mL Intravenous Once  . thiamine  100 mg Oral Daily   Continuous Infusions: . cefTRIAXone (ROCEPHIN)  IV    . heparin 950 Units/hr (03/16/20 1026)  . lactated ringers 100 mL/hr at 03/16/20 1026   PRN Meds:.acetaminophen **OR** acetaminophen, HYDROmorphone (DILAUDID) injection, LORazepam **OR** LORazepam, polyethylene glycol, traZODone Medications Prior to Admission:  Prior to Admission medications   Medication Sig Start Date End Date Taking? Authorizing Provider  folic acid (FOLVITE) 1 MG tablet Take 1 tablet (1 mg total) by mouth daily. 03/06/20 04/05/20 Yes  Manuella Ghazi, Pratik D, DO  Magnesium Oxide 400 MG CAPS Take 1 capsule (400 mg total) by mouth daily. 03/05/20  Yes Shah, Pratik D, DO  pantoprazole (PROTONIX) 40 MG tablet Please take '40mg'$  (1 tablet) twice daily for 30 days and then 1 tablet daily thereafter. Patient taking differently: Take 40 mg by mouth 2 (two) times daily before a meal. Please take '40mg'$  (1 tablet) twice daily for 30 days and then 1 tablet daily thereafter. 03/05/20  Yes Shah, Pratik D, DO  thiamine 100 MG tablet Take 1 tablet (100 mg total) by mouth daily. 03/06/20  Yes Shah, Pratik D, DO  Multiple Vitamin (MULTIVITAMIN WITH MINERALS) TABS tablet Take 1 tablet by mouth daily. Patient not taking: Reported on 03/02/2020 12/10/19   Murlean Iba, MD   Allergies  Allergen Reactions  . Lisinopril Swelling   Review of Systems  Constitutional: Positive for activity change.  Gastrointestinal: Positive for diarrhea.  Neurological: Positive for weakness.   Physical Exam Vitals and nursing note reviewed.  Constitutional:      General: He is awake.  Pulmonary:     Effort: No tachypnea, accessory muscle usage or respiratory distress.  Abdominal:     Tenderness: There is no  abdominal tenderness.  Skin:    General: Skin is warm and dry.  Neurological:     Mental Status: He is alert and oriented to person, place, and time.  Psychiatric:        Attention and Perception: Attention normal.        Mood and Affect: Mood normal.        Cognition and Memory: Cognition normal.    Vital Signs: BP 120/70 (BP Location: Right Arm)   Pulse 84   Temp 98.8 F (37.1 C) (Oral)   Resp 16   Ht '5\' 6"'$  (1.676 m)   Wt 61.2 kg   SpO2 98%   BMI 21.79 kg/m  Pain Scale: 0-10   Pain Score: 0-No pain   SpO2: SpO2: 98 % O2 Device:SpO2: 98 % O2 Flow Rate: .   IO: Intake/output summary:   Intake/Output Summary (Last 24 hours) at 03/16/2020 1259 Last data filed at 03/16/2020 0400 Gross per 24 hour  Intake 2186.96 ml  Output --  Net 2186.96 ml    LBM: Last BM Date: 03/15/20 Baseline Weight: Weight: 61.2 kg Most recent weight: Weight: 61.2 kg     Palliative Assessment/Data: PPS 50%     Time In/Out: 1020-1050, 1250-1300 Time Total: 38mn Greater than 50%  of this time was spent counseling and coordinating care related to the above assessment and plan.  Signed by:  MIhor Dow DNP, FNP-C Palliative Medicine Team  Phone: [phone]: [phone]  Please contact Palliative Medicine Team phone at [phone] questions and concerns.  For individual provider: See AShea Evans

## 2020-03-16 NOTE — Progress Notes (Signed)
Signed out by Dr. Clayton Lefort to follow CT head before starting heparin.  CT head is negative.  Will start heparin as per initial recommendation by hematology for portal vein thrombosis, splenic vein thrombosis, superior mesenteric vein thrombosis. Plan start heparin protocol per pharmacy.

## 2020-03-16 NOTE — Progress Notes (Addendum)
ANTICOAGULATION CONSULT NOTE - Initial Consult  Pharmacy Consult for heparin Indication: portal vein thrombosis  Allergies  Allergen Reactions  . Lisinopril Swelling    Patient Measurements: Height: 5\' 6"  (167.6 cm) Weight: 61.2 kg (135 lb) IBW/kg (Calculated) : 63.8 Heparin Dosing Weight: 61.2 kg  Vital Signs: Temp: 98.3 F (36.8 C) (05/04 1635) BP: 132/74 (05/04 1635) Pulse Rate: 99 (05/04 1635)  Labs: Recent Labs    03/15/20 1222 03/15/20 1222 03/15/20 2223 03/16/20 0618 03/16/20 0831 03/16/20 1607  HGB 9.9*   < > 8.7* 9.5*  --   --   HCT 31.5*  --  27.4* 31.1*  --   --   PLT 46*  --  37* 46*  --   --   APTT  --   --  45* 78*  --   --   LABPROT  --   --  15.8* 15.2  --   --   INR  --   --  1.3* 1.2  --   --   HEPARINUNFRC  --   --   --   --  0.11* 0.14*  CREATININE 2.44*  --   --  1.92*  --   --    < > = values in this interval not displayed.    Estimated Creatinine Clearance: 31.4 mL/min (A) (by C-G formula based on SCr of 1.92 mg/dL (H)).    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  His PTLC is very low, 37K, and he has a h/o GI bleed.  Okay with heme/onc to start as CT head is negative for bleed.  He was not on anticoagulation PTA.  Goal of Therapy:  Heparin level 0.3-0.7 units/ml target lower end of goal Monitor platelets by anticoagulation protocol: Yes   03/16/20 1700 update HL: 0.14 IU/mL, sub-therapeutic on heparin at 950 units./hr CBC:   H/H: 9.5/31.1       Plates 46K RN reports that patient's urine is  pinkish/reddish in appearance Heme/Onc and internal medicine providers aware--> waiting for response   Plan:  Increase heparin infusion rate to  1100 units/hr (no bolus d/t hx of GIB)  Re-check heparin level 6-8 hours after  rate increase Daily HL and CBC Monitor for bleeding complications  Despina Pole 03/16/2020,5:01 PM

## 2020-03-16 NOTE — Consult Note (Addendum)
Referring Provider: Irwin Brakeman, MD Primary Care Physician:  [name], FNP Primary Gastroenterologist:  Dr. Laural Golden  Reason for Consultation:    Portal, splenic, superior mesenteric vein thrombosis. Abnormality to pancreatic tail.  HPI:   History is provided by patient and his wife Ms. Jeralyn Ruths who is at bedside.  Patient is 69 year old African-American male who is well-known to me from previous encounters was last seen about 2 weeks ago when he was hospitalized with melena and anemia.  He received 1 unit of PRBCs.  Esophagogastroduodenoscopy revealed small sliding hiatal hernia 4 small gastric ulcers without stigmata of bleed.  Gastric biopsy was negative for H. pylori.  He was also noted to have alcoholic hepatitis based on his blood work.  Hepatitis B surface antigen and HCV antibody were negative.  He was discharged on 03/05/2020. Patient presented to emergency room yesterday with history of copious nonbloody diarrhea and weakness.  According to his wife he had diarrhea on 4 days prior to admission and she gave him Imodium and his diarrhea resolved.  2 days ago he had at least 8 bowel movements when he passed large amount of mushy loose stool and felt very weak.  He did not experience nausea vomiting fever chills abdominal pain.  Patient states his baseline generally is 1-2 bowel movements per day.  Evaluation in emergency room revealed elevated BUN and creatinine.  Serum sodium was 132.  Serum potassium however was normal at 4.1.  Bilirubin was 2.9 with AP of 71 AST of 62 and ALT of 26.  Serum lipase was 655.  WBC was 8.7 H&H of 8.7 and 27.4 and platelet count was 30 7K.  Respiratory panel was negative.  Serum lactic acid level was normal at 1.6.  Blood cultures were obtained.  Chest CT did not reveal pulmonary infiltrates.  Head CT revealed cerebral atrophy consistent with chronic microvascular disease.  However abdominopelvic CT with contrast revealed thrombus at confluence  of portal vein extending into SMV and splenic vein, peripancreatic edema involving tail of pancreas along with 2 hypodensities concerning for cyst or dilated duct.  The study also revealed fatty liver.  CT also revealed collapse or consolidative changes in both lower lobes.  There was no ascites or mesenteric edema. Patient was admitted to hospitalist service and begun on azithromycin and metronidazole and ceftriaxone as well as IV heparin. Lab studies from this morning revealed serum lipase of 820, BUN of 34 and creatinine of 1.92. Bilirubin is 2.5, AST 58 and ALT is 23 with albumin of 2.7.  INR normal at 1.2. H&H 9.5 and hematocrit 31.1 and platelet count of 46K.  Patient states he had 2 loose stools today.  He did experience some cramping across mid abdomen before he came to the hospital.  He is not having any pain at this time.  He also denies nausea vomiting melena or rectal bleeding.  His wife states that he has not had any alcohol since 03/11/2020.  Patient states he is hungry.  He denies chest pain shortness of breath or heartburn.  His wife feels he may have lost 10 to 15 pounds this year but she is not sure.  Patient is retired.  He worked in [employer] for over 40 years.  He has 3 grownup sons. He has never smoked cigarettes but he has been drinking pint of gin every day until 5 days ago.   Past Medical History:  Diagnosis Date  . Alcoholism ([place])   . Anemia   .  High cholesterol   . Hypertension   . Prostate cancer ([place])   . Seizures ([place])     Past Surgical History:  Procedure Laterality Date  . BIOPSY  03/03/2020   Procedure: BIOPSY;  Surgeon: Rogene Houston, MD;  [facility];  Service: Endoscopy;;  gastric  . CIRCUMCISION    . COLONOSCOPY N/A 09/13/2016   Procedure: COLONOSCOPY;  Surgeon: Rogene Houston, MD;  [facility];  Service: Endoscopy;  Laterality: N/A;  2:15  . COLONOSCOPY N/A 04/06/2018   Procedure: COLONOSCOPY;  Surgeon: Rogene Houston, MD;  [facility];  Service: Endoscopy;  Laterality: N/A;  . ESOPHAGOGASTRODUODENOSCOPY (EGD) WITH PROPOFOL N/A 03/03/2020   Procedure: ESOPHAGOGASTRODUODENOSCOPY (EGD) WITH PROPOFOL;  Surgeon: Rogene Houston, MD;  [facility];  Service: Endoscopy;  Laterality: N/A;  . FLEXIBLE SIGMOIDOSCOPY N/A 01/10/2018   Procedure: FLEXIBLE SIGMOIDOSCOPY;  Surgeon: Rogene Houston, MD;  [facility];  Service: Endoscopy;  Laterality: N/A;  1:55  . FLEXIBLE SIGMOIDOSCOPY N/A 06/08/2018   Procedure: FLEXIBLE SIGMOIDOSCOPY;  Surgeon: Danie Binder, MD;  [facility];  Service: Endoscopy;  Laterality: N/A;  . HEMORRHOID SURGERY N/A 04/09/2018   Procedure: HEMORRHOIDECTOMY;  Surgeon: Aviva Signs, MD;  Location: [facility];  Service: General;  Laterality: N/A;  . NO PAST SURGERIES    . POLYPECTOMY  04/06/2018   Procedure: POLYPECTOMY;  Surgeon: Rogene Houston, MD;  [facility];  Service: Endoscopy;;  sigmoid  . PROSTATE BIOPSY    . VIDEO ASSISTED THORACOSCOPY (VATS)/EMPYEMA Left 07/07/2018   Procedure: VIDEO ASSISTED THORACOSCOPY (VATS)/DRAINAGE OF EMPYEMA, DECORTICATION;  Surgeon: [name], MD;  Location: [place] [facility];  Service: Thoracic;  Laterality: Left;    Prior to Admission medications   Medication Sig Start Date End Date Taking? Authorizing Provider  folic acid (FOLVITE) 1 MG tablet Take 1 tablet (1 mg total) by mouth daily. 03/06/20 04/05/20 Yes Shah, Pratik D, DO  Magnesium Oxide 400 MG CAPS Take 1 capsule (400 mg total) by mouth daily. 03/05/20  Yes Shah, Pratik D, DO  pantoprazole (PROTONIX) 40 MG tablet Please take 40mg  (1 tablet) twice daily for 30 days and then 1 tablet daily thereafter. Patient taking differently: Take 40 mg by mouth 2 (two) times daily before a meal. Please take 40mg  (1 tablet) twice daily for 30 days and then 1 tablet daily thereafter. 03/05/20  Yes Shah, Pratik D, DO  thiamine 100 MG tablet Take 1 tablet (100  mg total) by mouth daily. 03/06/20  Yes Shah, Pratik D, DO  Multiple Vitamin (MULTIVITAMIN WITH MINERALS) TABS tablet Take 1 tablet by mouth daily. Patient not taking: Reported on 03/02/2020 12/10/19   Murlean Iba, MD    Current Facility-Administered Medications  Medication Dose Route Frequency Provider Last Rate Last Admin  . acetaminophen (TYLENOL) tablet 325 mg  325 mg Oral Q6H PRN Clarnce Flock, MD   325 mg at 03/15/20 2306   Or  . acetaminophen (TYLENOL) suppository 325 mg  325 mg Rectal Q6H PRN Clarnce Flock, MD      . cefTRIAXone (ROCEPHIN) 2 g in sodium chloride 0.9 % 100 mL IVPB  2 g Intravenous Q24H Johnson, Clanford L, MD      . folic acid (FOLVITE) tablet 1 mg  1 mg Oral Daily Clarnce Flock, MD   1 mg at 03/16/20 1025  . heparin ADULT infusion 100 units/mL (25000 units/278mL sodium chloride 0.45%)  950 Units/hr Intravenous Continuous Johnson, Clanford L, MD 9.5 mL/hr at 03/16/20 1026 950 Units/hr at 03/16/20 1026  . HYDROmorphone (DILAUDID) injection 0.5-1 mg  0.5-1 mg Intravenous Q2H PRN Clarnce Flock, MD      . lactated ringers infusion   Intravenous Continuous Wynetta Emery, Clanford L, MD 100 mL/hr at 03/16/20 1026 Rate Change at 03/16/20 1026  . LORazepam (ATIVAN) tablet 1-4 mg  1-4 mg Oral Q1H PRN Clarnce Flock, MD       Or  . LORazepam (ATIVAN) injection 1-4 mg  1-4 mg Intravenous Q1H PRN Clarnce Flock, MD      . magnesium oxide (MAG-OX) tablet 400 mg  400 mg Oral Daily Clarnce Flock, MD   400 mg at 03/16/20 1025  . multivitamin with minerals tablet 1 tablet  1 tablet Oral Daily Clarnce Flock, MD   1 tablet at 03/16/20 1024  . pantoprazole (PROTONIX) EC tablet 40 mg  40 mg Oral BID AC Clarnce Flock, MD   40 mg at 03/16/20 1025  . polyethylene glycol (MIRALAX / GLYCOLAX) packet 17 g  17 g Oral Daily PRN Clarnce Flock, MD      . sodium chloride flush (NS) 0.9 % injection 3 mL  3 mL Intravenous Once Clarnce Flock, MD      .  thiamine tablet 100 mg  100 mg Oral Daily Clarnce Flock, MD   100 mg at 03/16/20 1025  . traZODone (DESYREL) tablet 50 mg  50 mg Oral QHS PRN Clarnce Flock, MD   50 mg at 03/15/20 2306    Allergies as of 03/15/2020 - Review Complete 03/15/2020  Allergen Reaction Noted  . Lisinopril Swelling 09/11/2016    Family History  Problem Relation Age of Onset  . Alzheimer's disease Mother   . Hypertension Mother   . Hypertension Father   . Stroke Father   . Prostate cancer Father   . Hypertension Brother     Social History   Socioeconomic History  . Marital status: Married    Spouse name: Not on file  . Number of children: Not on file  . Years of education: Not on file  . Highest education level: Not on file  Occupational History  . Not on file  Tobacco Use  . Smoking status: Never Smoker  . Smokeless tobacco: Never Used  Substance and Sexual Activity  . Alcohol use: Yes    Comment: 1-2 bottles of Gin every few days  . Drug use: No  . Sexual activity: Not on file  Other Topics Concern  . Not on file  Social History Narrative  . Not on file   Social Determinants of Health   Financial Resource Strain:   . Difficulty of Paying Living Expenses:   Food Insecurity:   . Worried About Charity fundraiser in the Last Year:   . Arboriculturist in the Last Year:   Transportation Needs:   . Film/video editor (Medical):   Marland Kitchen Lack of Transportation (Non-Medical):   Physical Activity:   . Days of Exercise per Week:   . Minutes of Exercise per Session:   Stress:   . Feeling of Stress :   Social Connections:   . Frequency of Communication with Friends and Family:   . Frequency of Social Gatherings with Friends and Family:   . Attends Religious Services:   . Active Member of Clubs or Organizations:   . Attends Archivist  Meetings:   Marland Kitchen Marital Status:   Intimate Partner Violence:   . Fear of Current or Ex-Partner:   . Emotionally Abused:   Marland Kitchen Physically  Abused:   . Sexually Abused:     Review of Systems: See HPI, otherwise normal ROS  Physical Exam: Temp:  [98.8 F (37.1 C)-101.3 F (38.5 C)] 98.8 F (37.1 C) (05/04 0258) Pulse Rate:  [84-100] 84 (05/04 0258) Resp:  [16-20] 16 (05/04 0258) BP: (120-164)/(66-92) 120/70 (05/04 0258) SpO2:  [95 %-99 %] 98 % (05/04 0258) Last BM Date: 03/15/20  Patient is alert and in no acute distress. Conjunctivae is somewhat pale and sclerae nonicteric. Oropharyngeal mucosa is normal. Few teeth are missing and remaining teeth in fair condition. Neck without masses or thyromegaly. Cardiac exam with regular rhythm normal S1 and S2.  No murmur gallop noted. Auscultation lungs reveal vesicular breath sounds bilaterally. Abdomen is symmetrical.  Bowel sounds are normal.  On palpation abdomen is soft.  Liver edge is easily palpable below RCM is mildly tender.  Spleen is not palpable.  No tenderness noted in epigastric or periumbilical region. No peripheral edema clubbing noted. Patient does not have tremors.   Lab Results: Recent Labs    03/15/20 1222 03/15/20 2223 03/16/20 0618  WBC 9.8 8.7 9.2  HGB 9.9* 8.7* 9.5*  HCT 31.5* 27.4* 31.1*  PLT 46* 37* 46*   BMET Recent Labs    03/15/20 1222 03/16/20 0618  NA 132* 136  K 4.1 4.0  CL 97* 102  CO2 20* 19*  GLUCOSE 137* 105*  BUN 35* 34*  CREATININE 2.44* 1.92*  CALCIUM 8.2* 7.8*   LFT Recent Labs    03/16/20 0618  PROT 6.3*  ALBUMIN 2.7*  AST 58*  ALT 23  ALKPHOS 70  BILITOT 2.5*   PT/INR Recent Labs    03/15/20 2223 03/16/20 0618  LABPROT 15.8* 15.2  INR 1.3* 1.2   Hepatitis Panel No results for input(s): HEPBSAG, HCVAB, HEPAIGM, HEPBIGM in the last 72 hours.  Studies/Results: DG Chest 2 View  Result Date: 03/15/2020 CLINICAL DATA:  [place] EXAM: CHEST - 2 VIEW COMPARISON:  03/04/2020 FINDINGS: Heart is normal size. Left lower lobe atelectasis or infiltrate noted. No confluent opacity on the right. No effusions or  acute bony abnormality. IMPRESSION: Left basilar atelectasis or infiltrate. Electronically Signed   By: Rolm Baptise M.D.   On: 03/15/2020 21:40   CT HEAD WO CONTRAST  Result Date: 03/15/2020 CLINICAL DATA:  Encephalopathy EXAM: CT HEAD WITHOUT CONTRAST TECHNIQUE: Contiguous axial images were obtained from the base of the skull through the vertex without intravenous contrast. COMPARISON:  None. FINDINGS: Brain: There is atrophy and chronic small vessel disease changes. No acute intracranial abnormality. Specifically, no hemorrhage, hydrocephalus, mass lesion, acute infarction, or significant intracranial injury. Vascular: No hyperdense vessel or unexpected calcification. Skull: No acute calvarial abnormality. Sinuses/Orbits: Visualized paranasal sinuses and mastoids clear. Orbital soft tissues unremarkable. Other: None IMPRESSION: Atrophy, chronic microvascular disease. No acute intracranial abnormality. Electronically Signed   By: Rolm Baptise M.D.   On: 03/15/2020 21:39   CT ABDOMEN PELVIS W CONTRAST  Result Date: 03/15/2020 CLINICAL DATA:  Diarrhea. Abdominal tenderness. Elevated lipase. History of prostate cancer. EXAM: CT ABDOMEN AND PELVIS WITH CONTRAST TECHNIQUE: Multidetector CT imaging of the abdomen and pelvis was performed using the standard protocol following bolus administration of intravenous contrast. CONTRAST:  36mL OMNIPAQUE IOHEXOL 300 MG/ML  SOLN COMPARISON:  None. FINDINGS: Lower chest: Collapse/consolidative changes noted in the lower lobes  bilaterally, left greater than right. No substantial pleural effusion. Hepatobiliary: The liver shows diffusely decreased attenuation suggesting fat deposition. No suspicious focal abnormality within the liver parenchyma. There is no evidence for gallstones, gallbladder wall thickening, or pericholecystic fluid. No intrahepatic or extrahepatic biliary dilation. Pancreas: Subtle peripancreatic edema evident with either irregular substantial dilatation of  the pancreatic duct in the tail of pancreas versus cystic pancreatic lesion measuring on the order of 3.1 x 1.8 cm. Spleen: Edema noted in the splenic hilum. Spleen otherwise unremarkable. Adrenals/Urinary Tract: No adrenal nodule or mass. Right kidney unremarkable. 10 mm hypoattenuating lesion noted lower pole left kidney with attenuation too high to be a simple cyst. No evidence for hydroureter. The urinary bladder appears normal for the degree of distention. Stomach/Bowel: Stomach is nondistended. 2.2 x 1.4 cm soft tissue lesion or fluid collection involving the posterior wall of the gastric fundus appears to communicate with the pancreatic tail lesion. Duodenum is normally positioned as is the ligament of Treitz. No small bowel wall thickening. No small bowel dilatation. The terminal ileum is normal. The appendix is best seen on coronal images and is unremarkable. No gross colonic mass. No colonic wall thickening. Vascular/Lymphatic: No abdominal aortic aneurysm. No abdominal aortic atherosclerotic calcification. There is no gastrohepatic or hepatoduodenal ligament lymphadenopathy. No retroperitoneal or mesenteric lymphadenopathy. 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 Splenic vein may well be occluded. Reproductive: Fiducial markers noted in the prostate gland. Other: No intraperitoneal free fluid. Musculoskeletal: No worrisome lytic or sclerotic osseous abnormality. IMPRESSION: 1. Thrombus is identified in the portosplenic confluence, involving the superior mesenteric vein and portal vein. Splenic vein may well be occluded. 2. Subtle peripancreatic edema with either irregular substantial dilatation of the pancreatic duct in the tail of pancreas versus ill-defined cystic or hypoenhancing pancreatic lesion measuring on the order of 3.1 x 1.8 cm. Imaging features raise concern for pancreatic neoplasm although this could simply represent  pancreatitis with parenchymal pseudocyst. Abdominal MRI without and with contrast may prove helpful to further evaluate. 3. 2.2 x 1.4 cm soft tissue lesion or fluid collection involving the posterior wall of the gastric fundus appears to communicate with the pancreatic tail lesion. 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 4. Collapse/consolidative changes in the lower lobes bilaterally, left greater than right. 5. Hepatic steatosis. 6. 10 mm hypoattenuating lesion lower pole left kidney with attenuation too high to be a simple cyst. This may be a cyst complicated by proteinaceous debris or hemorrhage, but neoplasm cannot be excluded. This could also be further evaluated the time of follow-up MRI. Electronically Signed   By: Misty Stanley M.D.   On: 03/15/2020 17:04   CT reviewed with Dr. Lavonia Dana.  No recent study for comparison.  Assessment;  69 year old African-American male with history of alcoholic hepatitis peptic ulcer disease anemia and thrombocytopenia who presents with copious nonbloody diarrhea and discovered to have thrombus involving splenic, superior mesenteric vein and portal vein who also has pancreatic abnormality involving the tail of pancreas. GI issues can be summarized as below.  #1.  Thrombus involving orderliness and has SMV and splenic vein has to be treated as an acute finding although he does not have abdominal pain.  Patient is on heparin infusion.  Etiology would appear to be either chronic pancreatitis or pancreatic neoplasm.  I would favor chronic pancreatitis.  He does not have liver contour to suggest cirrhosis which he may which would also increase her risk of portal vein  thrombosis.  #2.  Changes involving tail of pancreas most likely due to chronic pancreatitis.  He now has biochemical and CT evidence of acute pancreatitis as well.  He has never been diagnosed or treated for pancreatitis but he could have had episodes in the past given long history of alcohol abuse.  I  suspect to be dealing with small pseudocysts.  Cystic pancreatic neoplasm remains in differential diagnosis.  #3.  Peptic ulcer disease.  He was diagnosed with peptic ulcer disease/gastric ulcers about 2 weeks ago when he presented with GI bleed.  He has been on PPI for 2 weeks risk of bleed below with anticoagulation.  #4.  Alcoholic hepatitis.  He does not have CT criterion of cirrhosis but he certainly is at risk unless he quits drinking alcohol.  This issue discussed with his wife who is at bedside.  #5.  Anemia anemia appears to be multifactorial but primarily due to chronic disease.  #6.  Thrombocytopenia.  His platelet count is lower than it has been.  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  #7.  Chest CT suggestive of pneumonia.  Patient is on azithromycin and ceftriaxone.  #8.  Nonbloody diarrhea.  Need to rule out infection.  #9.  Acute on chronic kidney injury secondary to dehydration.  Recommendations;  Increase IV fluid rate to 125 mm/h Continue pantoprazole to 40 mg p.o. twice daily. Continue n.p.o. status except p.o. medications. CBC metabolic 7 and serum lipase in a.m. CA 19-9. GI pathogen panel and stool C. difficile testing for for A and B toxin. Patient will be reevaluated in a.m. and further recommendations made.   LOS: 1 day   Linnea Todisco  03/16/2020, 1:47 PM

## 2020-03-16 NOTE — Progress Notes (Signed)
CRITICAL VALUE ALERT  Critical Value:  Aerobic & anaerobic bottles gram negative rods   Date & Time Notied:  03/16/2020 at [facility]  Provider Notified: Dr. Darrick Meigs   Orders Received/Actions taken: no new orders given at this time

## 2020-03-16 NOTE — Progress Notes (Signed)
[place] OF CARE NOTE   03/16/2020 4:04 PM  Clifford [name] was seen and examined.  The H&P by the admitting provider, orders, imaging was reviewed.  GI, palliative and hematology consults requested.  Blood cultures positive and will continue ceftriaxone IV pending ID and sensitivities.  Please see new orders.  I spoke with GI Dr. Laural Golden about plan of care.    Vitals:   03/15/20 2259 03/16/20 0258  BP: (!) 143/72 120/70  Pulse: 94 84  Resp: 20 16  Temp: (!) 101.3 F (38.5 C) 98.8 F (37.1 C)  SpO2: 98% 98%    Results for orders placed or performed during the hospital encounter of 03/15/20  Respiratory Panel by RT PCR (Flu A&B, Covid) - Nasopharyngeal Swab   Specimen: Nasopharyngeal Swab  Result Value Ref Range   SARS Coronavirus 2 by RT PCR NEGATIVE NEGATIVE   Influenza A by PCR NEGATIVE NEGATIVE   Influenza B by PCR NEGATIVE NEGATIVE  Blood culture (routine x 2)   Specimen: BLOOD RIGHT WRIST  Result Value Ref Range   Specimen Description      BLOOD RIGHT WRIST Performed at [facility], [address] [place], [place] [address]    Special Requests      BOTTLES DRAWN AEROBIC AND ANAEROBIC Blood Culture adequate volume Performed at [facility], [address], [place], [place] [address]    Culture  Setup Time      IN BOTH AEROBIC AND ANAEROBIC BOTTLES GRAM NEGATIVE RODS Gram Stain Report Called to,Read Back By and Verified With: T VILLALOBOS,RN @0433  03/16/20 MKELLY Organism ID to follow Performed at [facility] [address] [place], [place] [address]    Culture GRAM NEGATIVE RODS    Report Status PENDING   Blood culture (routine x 2)   Specimen: BLOOD RIGHT HAND  Result Value Ref Range   Specimen Description      BLOOD RIGHT HAND BOTTLES DRAWN AEROBIC AND ANAEROBIC Performed at [facility], [address], [place], [place] [address]    Special Requests      Blood Culture adequate volume Performed at [facility], [address] [place], [place]  [address]    Culture  Setup Time      IN BOTH AEROBIC AND ANAEROBIC BOTTLES GRAM NEGATIVE RODS Gram Stain Report Called to,Read Back By and Verified With: T Manson Passey @0432  03/16/20 MKELLY Performed at [facility], [address], [place], [place] [address]    Culture GRAM NEGATIVE RODS    Report Status PENDING   Blood Culture ID Panel (Reflexed)  Result Value Ref Range   Enterococcus species NOT DETECTED NOT DETECTED   Listeria monocytogenes NOT DETECTED NOT DETECTED   Staphylococcus species NOT DETECTED NOT DETECTED   Staphylococcus aureus (BCID) NOT DETECTED NOT DETECTED   Streptococcus species NOT DETECTED NOT DETECTED   Streptococcus agalactiae NOT DETECTED NOT DETECTED   Streptococcus pneumoniae NOT DETECTED NOT DETECTED   Streptococcus pyogenes NOT DETECTED NOT DETECTED   Acinetobacter baumannii NOT DETECTED NOT DETECTED   Enterobacteriaceae species DETECTED (A) NOT DETECTED   Enterobacter cloacae complex NOT DETECTED NOT DETECTED   Escherichia coli NOT DETECTED NOT DETECTED   Klebsiella oxytoca NOT DETECTED NOT DETECTED   Klebsiella pneumoniae DETECTED (A) NOT DETECTED   Proteus species NOT DETECTED NOT DETECTED   Serratia marcescens NOT DETECTED NOT DETECTED   Carbapenem resistance NOT DETECTED NOT DETECTED   Haemophilus influenzae NOT DETECTED NOT DETECTED   Neisseria meningitidis NOT DETECTED NOT DETECTED  Pseudomonas aeruginosa NOT DETECTED NOT DETECTED   Candida albicans NOT DETECTED NOT DETECTED   Candida glabrata NOT DETECTED NOT DETECTED   Candida krusei NOT DETECTED NOT DETECTED   Candida parapsilosis NOT DETECTED NOT DETECTED   Candida tropicalis NOT DETECTED NOT DETECTED  Lipase, blood  Result Value Ref Range   Lipase 655 (H) 11 - 51 U/L  Comprehensive metabolic panel  Result Value Ref Range   Sodium 132 (L) 135 - 145 mmol/L   Potassium 4.1 3.5 - 5.1 mmol/L   Chloride 97 (L) 98 - 111 mmol/L   CO2 20 (L) 22 - 32 mmol/L   Glucose, Bld 137 (H) 70 - 99  mg/dL   BUN 35 (H) 8 - 23 mg/dL   Creatinine, Ser 2.44 (H) 0.61 - 1.24 mg/dL   Calcium 8.2 (L) 8.9 - 10.3 mg/dL   Total Protein 6.7 6.5 - 8.1 g/dL   Albumin 3.0 (L) 3.5 - 5.0 g/dL   AST 62 (H) 15 - 41 U/L   ALT 26 0 - 44 U/L   Alkaline Phosphatase 71 38 - 126 U/L   Total Bilirubin 2.9 (H) 0.3 - 1.2 mg/dL   GFR calc non Af Amer 26 (L) >60 mL/min   GFR calc Af Amer 30 (L) >60 mL/min   Anion gap 15 5 - 15  CBC  Result Value Ref Range   WBC 9.8 4.0 - 10.5 K/uL   RBC 3.29 (L) 4.22 - 5.81 MIL/uL   Hemoglobin 9.9 (L) 13.0 - 17.0 g/dL   HCT 31.5 (L) 39.0 - 52.0 %   MCV 95.7 80.0 - 100.0 fL   MCH 30.1 26.0 - 34.0 pg   MCHC 31.4 30.0 - 36.0 g/dL   RDW 15.2 11.5 - 15.5 %   Platelets 46 (L) 150 - 400 K/uL   nRBC 0.0 0.0 - 0.2 %  Lactic acid, plasma  Result Value Ref Range   Lactic Acid, Venous 1.8 0.5 - 1.9 mmol/L  Lactic acid, plasma  Result Value Ref Range   Lactic Acid, Venous 1.6 0.5 - 1.9 mmol/L  CBC  Result Value Ref Range   WBC 8.7 4.0 - 10.5 K/uL   RBC 2.91 (L) 4.22 - 5.81 MIL/uL   Hemoglobin 8.7 (L) 13.0 - 17.0 g/dL   HCT 27.4 (L) 39.0 - 52.0 %   MCV 94.2 80.0 - 100.0 fL   MCH 29.9 26.0 - 34.0 pg   MCHC 31.8 30.0 - 36.0 g/dL   RDW 15.0 11.5 - 15.5 %   Platelets 37 (L) 150 - 400 K/uL   nRBC 0.0 0.0 - 0.2 %  Protime-INR  Result Value Ref Range   Prothrombin Time 15.8 (H) 11.4 - 15.2 seconds   INR 1.3 (H) 0.8 - 1.2  APTT  Result Value Ref Range   aPTT 45 (H) 24 - 36 seconds  Save Smear  Result Value Ref Range   Smear Review SMEAR STAINED AND AVAILABLE FOR REVIEW   TSH  Result Value Ref Range   TSH 5.574 (H) 0.350 - 4.500 uIU/mL  Comprehensive metabolic panel  Result Value Ref Range   Sodium 136 135 - 145 mmol/L   Potassium 4.0 3.5 - 5.1 mmol/L   Chloride 102 98 - 111 mmol/L   CO2 19 (L) 22 - 32 mmol/L   Glucose, Bld 105 (H) 70 - 99 mg/dL   BUN 34 (H) 8 - 23 mg/dL   Creatinine, Ser 1.92 (H) 0.61 - 1.24 mg/dL   Calcium 7.8 (  L) 8.9 - 10.3 mg/dL   Total Protein  6.3 (L) 6.5 - 8.1 g/dL   Albumin 2.7 (L) 3.5 - 5.0 g/dL   AST 58 (H) 15 - 41 U/L   ALT 23 0 - 44 U/L   Alkaline Phosphatase 70 38 - 126 U/L   Total Bilirubin 2.5 (H) 0.3 - 1.2 mg/dL   GFR calc non Af Amer 35 (L) >60 mL/min   GFR calc Af Amer 40 (L) >60 mL/min   Anion gap 15 5 - 15  CBC  Result Value Ref Range   WBC 9.2 4.0 - 10.5 K/uL   RBC 3.20 (L) 4.22 - 5.81 MIL/uL   Hemoglobin 9.5 (L) 13.0 - 17.0 g/dL   HCT 31.1 (L) 39.0 - 52.0 %   MCV 97.2 80.0 - 100.0 fL   MCH 29.7 26.0 - 34.0 pg   MCHC 30.5 30.0 - 36.0 g/dL   RDW 15.1 11.5 - 15.5 %   Platelets 46 (L) 150 - 400 K/uL   nRBC 0.0 0.0 - 0.2 %  Protime-INR  Result Value Ref Range   Prothrombin Time 15.2 11.4 - 15.2 seconds   INR 1.2 0.8 - 1.2  APTT  Result Value Ref Range   aPTT 78 (H) 24 - 36 seconds  TSH  Result Value Ref Range   TSH 3.800 0.350 - 4.500 uIU/mL  Vitamin B12  Result Value Ref Range   Vitamin B-12 732 180 - 914 pg/mL  Fibrinogen  Result Value Ref Range   Fibrinogen 390 210 - 475 mg/dL  Heparin level (unfractionated)  Result Value Ref Range   Heparin Unfractionated 0.11 (L) 0.30 - 0.70 IU/mL  Glucose, capillary  Result Value Ref Range   Glucose-Capillary 105 (H) 70 - 99 mg/dL  Lipase, blood  Result Value Ref Range   Lipase 820 (H) 11 - 51 U/L   C. Wynetta Emery, MD [facility]   03/15/2020  1:33 PM How to contact the [facility] Attending or Consulting provider [place] or covering provider during after hours [place], for this patient?  1. Check the care team in [facility] and look for a) attending/consulting [facility] provider listed and b) the [facility] team listed 2. Log into [email or web address] and use [place]'s universal password to access. If you do not have the password, please contact the hospital operator. 3. Locate the [facility] provider you are looking for under [facility] and page to a number that you can be directly reached. 4. If you still have difficulty reaching the provider, please page the [facility] (Director on  Call) for the Hospitalists listed on amion for assistance.

## 2020-03-16 NOTE — Progress Notes (Addendum)
ANTICOAGULATION CONSULT NOTE - Initial Consult  Pharmacy Consult for heparin Indication: portal vein thrombosis  Allergies  Allergen Reactions  . Lisinopril Swelling    Patient Measurements: Height: 5\' 6"  (167.6 cm) Weight: 61.2 kg (135 lb) IBW/kg (Calculated) : 63.8 Heparin Dosing Weight: 61.2 kg  Vital Signs: Temp: 98.8 F (37.1 C) (05/04 0258) Temp Source: Oral (05/04 0258) BP: 120/70 (05/04 0258) Pulse Rate: 84 (05/04 0258)  Labs: Recent Labs    03/15/20 1222 03/15/20 1222 03/15/20 2223 03/16/20 0618 03/16/20 0831  HGB 9.9*   < > 8.7* 9.5*  --   HCT 31.5*  --  27.4* 31.1*  --   PLT 46*  --  37* 46*  --   APTT  --   --  45* 78*  --   LABPROT  --   --  15.8* 15.2  --   INR  --   --  1.3* 1.2  --   HEPARINUNFRC  --   --   --   --  0.11*  CREATININE 2.44*  --   --  1.92*  --    < > = values in this interval not displayed.    Estimated Creatinine Clearance: 31.4 mL/min (A) (by C-G formula based on SCr of 1.92 mg/dL (H)).    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  His PTLC is very low, 37K, and he has a h/o GI bleed.  Okay with heme/onc to start as CT head is negative for bleed.  He was not on anticoagulation PTA.  Goal of Therapy:  Heparin level 0.3-0.7 units/ml target lower end of goal Monitor platelets by anticoagulation protocol: Yes   03/16/20 0930 update HL: 0.11 IU/mL, sub-therapeutic on heparin at 850 units./hr CBC:   H/H: 9.5/31.1       Plates 46K RN reports no bleeding complications or issues with infusion site   Plan:  Increase heparin infusion rate to   950 units/hr   Re-check heparin level 6-8 hours after  rate increase Daily HL and CBC Monitor for bleeding complications  Despina Pole 03/16/2020,9:44 AM

## 2020-03-17 DIAGNOSIS — E871 Hypo-osmolality and hyponatremia: Secondary | ICD-10-CM

## 2020-03-17 DIAGNOSIS — A4159 Other Gram-negative sepsis: Secondary | ICD-10-CM

## 2020-03-17 DIAGNOSIS — A414 Sepsis due to anaerobes: Secondary | ICD-10-CM

## 2020-03-17 LAB — HEPATITIS B CORE ANTIBODY, IGM: Hep B C IgM: NONREACTIVE

## 2020-03-17 LAB — CBC
HCT: 26.6 % — ABNORMAL LOW (ref 39.0–52.0)
Hemoglobin: 8.3 g/dL — ABNORMAL LOW (ref 13.0–17.0)
MCH: 29.4 pg (ref 26.0–34.0)
MCHC: 31.2 g/dL (ref 30.0–36.0)
MCV: 94.3 fL (ref 80.0–100.0)
Platelets: 36 10*3/uL — ABNORMAL LOW (ref 150–400)
RBC: 2.82 MIL/uL — ABNORMAL LOW (ref 4.22–5.81)
RDW: 15.3 % (ref 11.5–15.5)
WBC: 10.2 10*3/uL (ref 4.0–10.5)
nRBC: 0 % (ref 0.0–0.2)

## 2020-03-17 LAB — HEPATITIS B SURFACE ANTIGEN: Hepatitis B Surface Ag: NONREACTIVE

## 2020-03-17 LAB — BASIC METABOLIC PANEL
Anion gap: 13 (ref 5–15)
BUN: 31 mg/dL — ABNORMAL HIGH (ref 8–23)
CO2: 16 mmol/L — ABNORMAL LOW (ref 22–32)
Calcium: 7.3 mg/dL — ABNORMAL LOW (ref 8.9–10.3)
Chloride: 104 mmol/L (ref 98–111)
Creatinine, Ser: 1.76 mg/dL — ABNORMAL HIGH (ref 0.61–1.24)
GFR calc Af Amer: 45 mL/min — ABNORMAL LOW (ref >60)
GFR calc non Af Amer: 39 mL/min — ABNORMAL LOW (ref >60)
Glucose, Bld: 107 mg/dL — ABNORMAL HIGH (ref 70–99)
Potassium: 3.4 mmol/L — ABNORMAL LOW (ref 3.5–5.1)
Sodium: 133 mmol/L — ABNORMAL LOW (ref 135–145)

## 2020-03-17 LAB — CANCER ANTIGEN 19-9: CA 19-9: 53 U/mL — ABNORMAL HIGH (ref 0–35)

## 2020-03-17 LAB — C DIFFICILE QUICK SCREEN W PCR REFLEX
C Diff antigen: NEGATIVE
C Diff interpretation: NOT DETECTED
C Diff toxin: NEGATIVE

## 2020-03-17 LAB — FOLATE: Folate: 22.6 ng/mL (ref >5.9)

## 2020-03-17 LAB — IRON AND TIBC
Iron: 7 ug/dL — ABNORMAL LOW (ref 45–182)
Saturation Ratios: 3 % — ABNORMAL LOW (ref 17.9–39.5)
TIBC: 276 ug/dL (ref 250–450)
UIBC: 269 ug/dL

## 2020-03-17 LAB — AMYLASE: Amylase: 519 U/L — ABNORMAL HIGH (ref 28–100)

## 2020-03-17 LAB — LACTATE DEHYDROGENASE: LDH: 177 U/L (ref 98–192)

## 2020-03-17 LAB — LIPASE, BLOOD: Lipase: 1236 U/L — ABNORMAL HIGH (ref 11–51)

## 2020-03-17 LAB — FERRITIN: Ferritin: 76 ng/mL (ref 24–336)

## 2020-03-17 LAB — HEPARIN LEVEL (UNFRACTIONATED)
Heparin Unfractionated: 0.39 IU/mL (ref 0.30–0.70)
Heparin Unfractionated: 0.44 IU/mL (ref 0.30–0.70)

## 2020-03-17 LAB — HEPATITIS B SURFACE ANTIBODY,QUALITATIVE: Hep B S Ab: NONREACTIVE

## 2020-03-17 MED ORDER — POTASSIUM CHLORIDE 20 MEQ/15ML (10%) PO SOLN
20.0000 meq | Freq: Once | ORAL | Status: AC
  Administered 2020-03-17: 20 meq via ORAL
  Filled 2020-03-17: qty 30

## 2020-03-17 NOTE — Progress Notes (Signed)
Subjective:  Patient denies chest pain shortness of breath or cough.  He says he still passing liquid stools but small amount.  He denies melena or rectal bleeding.  He also denies abdominal pain.  He states he is hungry.  Dr. Carles Collet has ordered clear liquid meal and he is looking forward to it.  Objective: Blood pressure (!) 143/85, pulse 94, temperature (!) 100.9 F (38.3 C), temperature source Oral, resp. rate 16, height '5\' 6"'$  (1.676 m), weight 61.2 kg, SpO2 96 %. Patient is alert and in no acute distress. He does not have tremors. Cardiac exam with regular rhythm normal S1 and S2.  No murmur gallop noted. Auscultation lungs reveal vesicular breath sounds bilaterally.  Do not hear any bronchial breathing. Abdomen is full bowel sounds are normal.  On palpation abdomen is soft.  He has mild tenderness in left upper quadrant on deep palpation.  Liver edge is palpable and somewhat firm and not tender. No LE edema noted.  Labs/studies Results:  CBC Latest Ref Rng & Units 03/17/2020 03/16/2020 03/15/2020  WBC 4.0 - 10.5 K/uL 10.2 9.2 8.7  Hemoglobin 13.0 - 17.0 g/dL 8.3(L) 9.5(L) 8.7(L)  Hematocrit 39.0 - 52.0 % 26.6(L) 31.1(L) 27.4(L)  Platelets 150 - 400 K/uL 36(L) 46(L) 37(L)    CMP Latest Ref Rng & Units 03/17/2020 03/16/2020 03/15/2020  Glucose 70 - 99 mg/dL 107(H) 105(H) 137(H)  BUN 8 - 23 mg/dL 31(H) 34(H) 35(H)  Creatinine 0.61 - 1.24 mg/dL 1.76(H) 1.92(H) 2.44(H)  Sodium 135 - 145 mmol/L 133(L) 136 132(L)  Potassium 3.5 - 5.1 mmol/L 3.4(L) 4.0 4.1  Chloride 98 - 111 mmol/L 104 102 97(L)  CO2 22 - 32 mmol/L 16(L) 19(L) 20(L)  Calcium 8.9 - 10.3 mg/dL 7.3(L) 7.8(L) 8.2(L)  Total Protein 6.5 - 8.1 g/dL - 6.3(L) 6.7  Total Bilirubin 0.3 - 1.2 mg/dL - 2.5(H) 2.9(H)  Alkaline Phos 38 - 126 U/L - 70 71  AST 15 - 41 U/L - 58(H) 62(H)  ALT 0 - 44 U/L - 23 26    Hepatic Function Latest Ref Rng & Units 03/16/2020 03/15/2020 03/05/2020  Total Protein 6.5 - 8.1 g/dL 6.3(L) 6.7 6.5  Albumin 3.5 - 5.0  g/dL 2.7(L) 3.0(L) 3.2(L)  AST 15 - 41 U/L 58(H) 62(H) 70(H)  ALT 0 - 44 U/L '23 26 29  '$ Alk Phosphatase 38 - 126 U/L 70 71 84  Total Bilirubin 0.3 - 1.2 mg/dL 2.5(H) 2.9(H) 1.2  Bilirubin, Direct 0.0 - 0.2 mg/dL - - -    Serum lipase 1236.  It was 820 yesterday  C. difficile testing negative for antigen and toxin.  CA 19-9 is 53(0-35)  Assessment:  #1.  Portal/splenic and SMV thrombosis.  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  He possibly has experienced pancreatitis in the past without a definite diagnosis.  He is on heparin infusion.  He will be transitioned to oral agent prior to discharge.  #2.  Pancreatitis.  Suspect pancreatic pseudocyst in the tail of pancreas.  No prior study for comparison.  Serum lipase is going up.  He does not have abdominal pain.  On palpation he has mild tenderness at LLQ on deep palpation.  Therefore agree with starting patient on clear liquids. Will check serum amylase amylase today and in a.m. along with serum lipase. CA 19-9 with insignificant elevation which is reassuring.  #3.  History of peptic ulcer disease.  Patient is on double dose PPI.  No evidence of GI bleed.  #4.  Klebsiella sepsis.  Blood cultures positive from 2 days ago.  Source would appear to be pneumonia given that he has lower lobe consolidation.  Surprisingly he has no respiratory symptoms.  #5.  Diarrhea.  He remains with loose stools.  C. difficile testing negative.  Will test for other infections.  #6.  Anemia.  Anemia appears to be predominantly due to chronic disease made worse by acute illness.  He was recently hospitalized for upper GI bleed secondary to gastric ulcers.  So far no evidence of GI bleed.  #7.  Acute on chronic kidney disease.  Renal function is improving.  #8.  Thrombocytopenia is stable.  Appreciate Dr. Tomie China note.  He feels acute drop in platelet count secondary to sepsis.  Venous thrombosis may also be contributing.  Recommendations:  We will decrease  IV fluids to 75 mL/h since patient begun on clear liquids. GI pathogen panel. Serum amylase and lipase in a.m. Also asked lab to run serum lipase if any serum lab from this morning.

## 2020-03-17 NOTE — Progress Notes (Signed)
ANTICOAGULATION CONSULT NOTE -   Pharmacy Consult for heparin Indication: portal vein thrombosis  Allergies  Allergen Reactions  . Lisinopril Swelling    Patient Measurements: Height: 5\' 6"  (167.6 cm) Weight: 61.2 kg (135 lb) IBW/kg (Calculated) : 63.8 Heparin Dosing Weight: 61.2 kg  Vital Signs: Temp: 100.4 F (38 C) (05/05 0505) Temp Source: Oral (05/05 0505) BP: 131/71 (05/05 0505) Pulse Rate: 91 (05/05 0505)  Labs: Recent Labs    03/15/20 1222 03/15/20 1222 03/15/20 2223 03/15/20 2223 03/16/20 0618 03/16/20 0831 03/16/20 1607 03/17/20 0131 03/17/20 0807  HGB 9.9*   < > 8.7*   < > 9.5*  --   --  8.3*  --   HCT 31.5*   < > 27.4*  --  31.1*  --   --  26.6*  --   PLT 46*   < > 37*  --  46*  --   --  36*  --   APTT  --   --  45*  --  78*  --   --   --   --   LABPROT  --   --  15.8*  --  15.2  --   --   --   --   INR  --   --  1.3*  --  1.2  --   --   --   --   HEPARINUNFRC  --   --   --   --   --    < > 0.14* 0.39 0.44  CREATININE 2.44*  --   --   --  1.92*  --   --  1.76*  --    < > = values in this interval not displayed.    Estimated Creatinine Clearance: 34.3 mL/min (A) (by C-G formula based on SCr of 1.76 mg/dL (H)).    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  His PTLC is very low, 37K, and he has a h/o GI bleed.  Okay with heme/onc to start as CT head is negative for bleed.  He was not on anticoagulation PTA.  HL 0.44- therapeutic  Goal of Therapy:  Heparin level 0.3-0.7 units/ml target lower end of goal Monitor platelets by anticoagulation protocol: Yes     Plan:  Continue heparin infusion rate at 1100 units/hr  heparin level daily Daily HL and CBC Monitor for bleeding complications  Margot Ables, PharmD Clinical Pharmacist 03/17/2020 8:57 AM

## 2020-03-17 NOTE — Progress Notes (Signed)
PROGRESS NOTE  Clifford [name] [id number] DOB: [birth year] DOA: 03/15/2020 PCP: [name], FNP  Brief History:  69 year old male with a history of prostate cancer, alcohol abuse, thrombocytopenia, hypertension, hyperlipidemia, GI bleed presenting with generalized weakness and diarrhea.  He denied any fevers, chills but has some dyspnea on exertion.  There is no nausea or vomiting.  There is no hematochezia or melena.  The patient normally drinks 1 pint of gin on a daily basis with the last drink on 03/11/2020.  His wife had stated that the patient had been confused over the last several days prior to admission.  He has had poor oral intake.  CT of the abdomen and pelvis showed thrombus in the SMV and portal vein as well as splenic vein.  Hematology was consulted.  The patient was started on IV heparin.  There was subtle peripancreatic edema with dilatation of the pancreatic duct in the tail with a hypoenhancing lesion.  There was concern for neoplasm versus pseudocyst versus pancreatitis.  There was bilateral lower lobe consolidation.  Assessment/Plan: Sepsis -Present at the time of admission -Secondary to bacteremia pneumonia -Lactic acid peaked 1.8 -Continue ceftriaxone  Klebsiella bacteremia -Continue ceftriaxone 2 g daily pending final culture data -Source is likely pneumonia  Lobar pneumonia -Continue ceftriaxone pending culture data -Stable on room air  Portal vein and SMV thrombosis -Continue IV heparin -Appreciate hematology consult -Monitor platelet counts -Splenic vein may also be occluded  Diarrhea -C. difficile negative -Stool pathogen panel  Acute on chronic renal failure--CKD stage IIIa -Secondary to sepsis and volume depletion -Baseline creatinine 1.0-1.3 -Presented with serum creatinine 2.44 -Continue IV fluids  Thrombocytopenia -Multifactorial including myelosuppression from his alcohol with acute worsening secondary to  sepsis  Pancreatic tail hypodensity/mass -Appreciate GI consult--suspect the changes are likely due to chronic pancreatitis with small pseudocysts although cystic pancreatic neoplasm remains in the differential diagnosis  Alcohol abuse -Alcohol withdrawal protocol  GOC  -full code, full scope of care    Disposition Plan: Patient From: Home D/C Place: Home- 2-3  Days Barriers: Not Clinically Stable--not tolerating diet, on IV abx awaiting susceptibility  Family Communication:   Spouse updated at bedside 5/5  Consultants:  GI, hematology, palliative  Code Status:  FULL   DVT Prophylaxis:  IV Heparin    Procedures: As Listed in Progress Note Above  Antibiotics: Ceftriaxone 5/3>>>     Subjective: Patient denies fevers, chills, headache, chest pain, dyspnea, nausea, vomiting, diarrhea, abdominal pain, dysuria, hematuria, hematochezia, and melena.   Objective: Vitals:   03/16/20 1635 03/16/20 2114 03/17/20 0505 03/17/20 1300  BP: 132/74 132/73 131/71 (!) 143/85  Pulse: 99 91 91 94  Resp: 20 20 18 16   Temp: 98.3 F (36.8 C) 99.7 F (37.6 C) (!) 100.4 F (38 C) (!) 100.9 F (38.3 C)  TempSrc:   Oral Oral  SpO2: 96% 98% 96% 96%  Weight:      Height:        Intake/Output Summary (Last 24 hours) at 03/17/2020 1555 Last data filed at 03/17/2020 0449 Gross per 24 hour  Intake 2757.6 ml  Output 950 ml  Net 1807.6 ml   Weight change:  Exam:   General:  Pt is alert, follows commands appropriately, not in acute distress  HEENT: No icterus, No thrush, No neck mass, [place]/AT  Cardiovascular: RRR, S1/S2, no rubs, no gallops  Respiratory: bibasilar crackles, no wheeze  Abdomen: Soft/+BS, non tender, non distended, no guarding  Extremities: No edema, No lymphangitis, No petechiae, No rashes, no synovitis   Data Reviewed: I have personally reviewed following labs and imaging studies Basic Metabolic Panel: Recent Labs  Lab 03/15/20 1222 03/16/20 0618  03/17/20 0131  NA 132* 136 133*  K 4.1 4.0 3.4*  CL 97* 102 104  CO2 20* 19* 16*  GLUCOSE 137* 105* 107*  BUN 35* 34* 31*  CREATININE 2.44* 1.92* 1.76*  CALCIUM 8.2* 7.8* 7.3*   Liver Function Tests: Recent Labs  Lab 03/15/20 1222 03/16/20 0618  AST 62* 58*  ALT 26 23  ALKPHOS 71 70  BILITOT 2.9* 2.5*  PROT 6.7 6.3*  ALBUMIN 3.0* 2.7*   Recent Labs  Lab 03/15/20 1222 03/16/20 0618 03/17/20 0131  LIPASE 655* 820* 1,236*   No results for input(s): AMMONIA in the last 168 hours. Coagulation Profile: Recent Labs  Lab 03/15/20 2223 03/16/20 0618  INR 1.3* 1.2   CBC: Recent Labs  Lab 03/15/20 1222 03/15/20 2223 03/16/20 0618 03/17/20 0131  WBC 9.8 8.7 9.2 10.2  HGB 9.9* 8.7* 9.5* 8.3*  HCT 31.5* 27.4* 31.1* 26.6*  MCV 95.7 94.2 97.2 94.3  PLT 46* 37* 46* 36*   Cardiac Enzymes: No results for input(s): CKTOTAL, CKMB, CKMBINDEX, TROPONINI in the last 168 hours. BNP: Invalid input(s): POCBNP CBG: Recent Labs  Lab 03/16/20 0740  GLUCAP 105*   HbA1C: No results for input(s): HGBA1C in the last 72 hours. Urine analysis:    Component Value Date/Time   COLORURINE YELLOW 03/04/2020 1609   APPEARANCEUR CLEAR 03/04/2020 1609   LABSPEC 1.015 03/04/2020 1609   PHURINE 5.0 03/04/2020 1609   GLUCOSEU NEGATIVE 03/04/2020 1609   HGBUR SMALL (A) 03/04/2020 1609   BILIRUBINUR NEGATIVE 03/04/2020 1609   KETONESUR NEGATIVE 03/04/2020 1609   PROTEINUR 30 (A) 03/04/2020 1609   NITRITE NEGATIVE 03/04/2020 1609   LEUKOCYTESUR NEGATIVE 03/04/2020 1609   Sepsis Labs: @LABRCNTIP (procalcitonin:4,lacticidven:4) ) Recent Results (from the past 240 hour(s))  Respiratory Panel by RT PCR (Flu A&B, Covid) - Nasopharyngeal Swab     Status: None   Collection Time: 03/15/20  3:52 PM   Specimen: Nasopharyngeal Swab  Result Value Ref Range Status   SARS Coronavirus 2 by RT PCR NEGATIVE NEGATIVE Final    Comment: (NOTE) SARS-CoV-2 target nucleic acids are NOT DETECTED. The  SARS-CoV-2 RNA is generally detectable in upper respiratoy specimens during the acute phase of infection. The lowest concentration of SARS-CoV-2 viral copies this assay can detect is 131 copies/mL. A negative result does not preclude SARS-Cov-2 infection and should not be used as the sole basis for treatment or other patient management decisions. A negative result may occur with  improper specimen collection/handling, submission of specimen other than nasopharyngeal swab, presence of viral mutation(s) within the areas targeted by this assay, and inadequate number of viral copies (<131 copies/mL). A negative result must be combined with clinical observations, patient history, and epidemiological information. The expected result is Negative. Fact Sheet for Patients:  PinkCheek.be Fact Sheet for Healthcare Providers:  GravelBags.it This test is not yet ap proved or cleared by the [place] FDA and  has been authorized for detection and/or diagnosis of SARS-CoV-2 by FDA under an Emergency Use Authorization (EUA). This EUA will remain  in effect (meaning this test can be used) for the duration of the COVID-19 declaration under Section 564(b)(1) of the Act, 21 U.S.C. section 360bbb-3(b)(1), unless the authorization is terminated or revoked sooner.    Influenza A by PCR NEGATIVE NEGATIVE Final  Influenza B by PCR NEGATIVE NEGATIVE Final    Comment: (NOTE) The Xpert Xpress SARS-CoV-2/FLU/RSV assay is intended as an aid in  the diagnosis of influenza from Nasopharyngeal swab specimens and  should not be used as a sole basis for treatment. Nasal washings and  aspirates are unacceptable for Xpert Xpress SARS-CoV-2/FLU/RSV  testing. Fact Sheet for Patients: PinkCheek.be Fact Sheet for Healthcare Providers: GravelBags.it This test is not yet approved or cleared by the [place] FDA and  has been authorized for detection and/or diagnosis of SARS-CoV-2 by  FDA under an Emergency Use Authorization (EUA). This EUA will remain  in effect (meaning this test can be used) for the duration of the  Covid-19 declaration under Section 564(b)(1) of the Act, 21  U.S.C. section 360bbb-3(b)(1), unless the authorization is  terminated or revoked. Performed at [facility], [address] [place], [place] [address]   Blood culture (routine x 2)     Status: Abnormal (Preliminary result)   Collection Time: 03/15/20  3:52 PM   Specimen: BLOOD RIGHT WRIST  Result Value Ref Range Status   Specimen Description   Final    BLOOD RIGHT WRIST Performed at [facility], [address] [place], [place] [address]    Special Requests   Final    BOTTLES DRAWN AEROBIC AND ANAEROBIC Blood Culture adequate volume Performed at [facility], [address], [place], [place] [address]    Culture  Setup Time   Final    IN BOTH AEROBIC AND ANAEROBIC BOTTLES GRAM NEGATIVE RODS Gram Stain Report Called to,Read Back By and Verified With: T VILLALOBOS,RN @0433  03/16/20 MKELLY    Culture (A)  Final    KLEBSIELLA PNEUMONIAE SUSCEPTIBILITIES TO FOLLOW Performed at [facility], [address], [place], [place] [address]    Report Status PENDING  Incomplete  Blood Culture ID Panel (Reflexed)     Status: Abnormal   Collection Time: 03/15/20  3:52 PM  Result Value Ref Range Status   Enterococcus species NOT DETECTED NOT DETECTED Final   Listeria monocytogenes NOT DETECTED NOT DETECTED Final   Staphylococcus species NOT DETECTED NOT DETECTED Final   Staphylococcus aureus (BCID) NOT DETECTED NOT DETECTED Final   Streptococcus species NOT DETECTED NOT DETECTED Final   Streptococcus agalactiae NOT DETECTED NOT DETECTED Final   Streptococcus pneumoniae NOT DETECTED NOT DETECTED Final   Streptococcus pyogenes NOT DETECTED NOT DETECTED Final   Acinetobacter baumannii NOT DETECTED NOT DETECTED  Final   Enterobacteriaceae species DETECTED (A) NOT DETECTED Final    Comment: Enterobacteriaceae represent a large family of gram-negative bacteria, not a single organism. CRITICAL RESULT CALLED TO, READ BACK BY AND VERIFIED WITH: Deborra Medina PharmD 9:10 03/16/20 (wilsonm)    Enterobacter cloacae complex NOT DETECTED NOT DETECTED Final   Escherichia coli NOT DETECTED NOT DETECTED Final   Klebsiella oxytoca NOT DETECTED NOT DETECTED Final   Klebsiella pneumoniae DETECTED (A) NOT DETECTED Final    Comment: CRITICAL RESULT CALLED TO, READ BACK BY AND VERIFIED WITH: Deborra Medina PharmD 9:10 03/16/20 (wilsonm)    Proteus species NOT DETECTED NOT DETECTED Final   Serratia marcescens NOT DETECTED NOT DETECTED Final   Carbapenem resistance NOT DETECTED NOT DETECTED Final   Haemophilus influenzae NOT DETECTED NOT DETECTED Final   Neisseria meningitidis NOT DETECTED NOT DETECTED Final   Pseudomonas aeruginosa NOT DETECTED NOT DETECTED Final   Candida albicans NOT DETECTED NOT DETECTED Final   Candida glabrata NOT DETECTED NOT DETECTED Final   Candida  krusei NOT DETECTED NOT DETECTED Final   Candida parapsilosis NOT DETECTED NOT DETECTED Final   Candida tropicalis NOT DETECTED NOT DETECTED Final    Comment: Performed at [facility], [place] [address], [place], [place] [address]  Blood culture (routine x 2)     Status: Abnormal (Preliminary result)   Collection Time: 03/15/20  3:53 PM   Specimen: BLOOD RIGHT HAND  Result Value Ref Range Status   Specimen Description   Final    BLOOD RIGHT HAND BOTTLES DRAWN AEROBIC AND ANAEROBIC Performed at [facility], [address], [place], [place] [address]    Special Requests   Final    Blood Culture adequate volume Performed at [facility], [address] [place], [place] [address]    Culture  Setup Time   Final    IN BOTH AEROBIC AND ANAEROBIC BOTTLES GRAM NEGATIVE RODS Gram Stain Report Called to,Read Back By and Verified With: T VILLALOBOS,RN @0432   03/16/20 [facility] Performed at [facility], [address] [place], [place] [address]    Culture KLEBSIELLA PNEUMONIAE (A)  Final   Report Status PENDING  Incomplete  C Difficile Quick Screen w PCR reflex     Status: None   Collection Time: 03/16/20  2:37 PM   Specimen: STOOL  Result Value Ref Range Status   C Diff antigen NEGATIVE NEGATIVE Final   C Diff toxin NEGATIVE NEGATIVE Final   C Diff interpretation No C. difficile detected.  Final    Comment: Performed at [facility], [address], [place], [place] [address]     Scheduled Meds: . folic acid  1 mg Oral Daily  . magnesium oxide  400 mg Oral Daily  . multivitamin with minerals  1 tablet Oral Daily  . pantoprazole  40 mg Oral BID AC  . sodium chloride flush  3 mL Intravenous Once  . thiamine  100 mg Oral Daily   Continuous Infusions: . cefTRIAXone (ROCEPHIN)  IV 2 g (03/16/20 1703)  . heparin 1,100 Units/hr (03/17/20 0246)  . lactated ringers 125 mL/hr at 03/17/20 0242    Procedures/Studies: DG Chest 2 View  Result Date: 03/15/2020 CLINICAL DATA:  [place] EXAM: CHEST - 2 VIEW COMPARISON:  03/04/2020 FINDINGS: Heart is normal size. Left lower lobe atelectasis or infiltrate noted. No confluent opacity on the right. No effusions or acute bony abnormality. IMPRESSION: Left basilar atelectasis or infiltrate. Electronically Signed   By: Rolm Baptise M.D.   On: 03/15/2020 21:40   CT HEAD WO CONTRAST  Result Date: 03/15/2020 CLINICAL DATA:  Encephalopathy EXAM: CT HEAD WITHOUT CONTRAST TECHNIQUE: Contiguous axial images were obtained from the base of the skull through the vertex without intravenous contrast. COMPARISON:  None. FINDINGS: Brain: There is atrophy and chronic small vessel disease changes. No acute intracranial abnormality. Specifically, no hemorrhage, hydrocephalus, mass lesion, acute infarction, or significant intracranial injury. Vascular: No hyperdense vessel or unexpected calcification. Skull: No acute calvarial  abnormality. Sinuses/Orbits: Visualized paranasal sinuses and mastoids clear. Orbital soft tissues unremarkable. Other: None IMPRESSION: Atrophy, chronic microvascular disease. No acute intracranial abnormality. Electronically Signed   By: Rolm Baptise M.D.   On: 03/15/2020 21:39   CT ABDOMEN PELVIS W CONTRAST  Result Date: 03/15/2020 CLINICAL DATA:  Diarrhea. Abdominal tenderness. Elevated lipase. History of prostate cancer. EXAM: CT ABDOMEN AND PELVIS WITH CONTRAST TECHNIQUE: Multidetector CT imaging of the abdomen and pelvis was performed using the standard protocol following bolus administration of intravenous contrast. CONTRAST:  27mL OMNIPAQUE IOHEXOL 300 MG/ML  SOLN COMPARISON:  None. FINDINGS: Lower chest: Collapse/consolidative changes noted in the lower lobes bilaterally, left greater than right. No substantial pleural effusion. Hepatobiliary: The liver shows diffusely decreased attenuation suggesting fat deposition. No suspicious focal abnormality within the liver parenchyma. There is no evidence for gallstones, gallbladder wall thickening, or pericholecystic fluid. No intrahepatic or extrahepatic biliary dilation. Pancreas: Subtle peripancreatic edema evident with either irregular substantial dilatation of the pancreatic duct in the tail of pancreas versus cystic pancreatic lesion measuring on the order of 3.1 x 1.8 cm. Spleen: Edema noted in the splenic hilum. Spleen otherwise unremarkable. Adrenals/Urinary Tract: No adrenal nodule or mass. Right kidney unremarkable. 10 mm hypoattenuating lesion noted lower pole left kidney with attenuation too high to be a simple cyst. No evidence for hydroureter. The urinary bladder appears normal for the degree of distention. Stomach/Bowel: Stomach is nondistended. 2.2 x 1.4 cm soft tissue lesion or fluid collection involving the posterior wall of the gastric fundus appears to communicate with the pancreatic tail lesion. Duodenum is normally positioned as is the  ligament of Treitz. No small bowel wall thickening. No small bowel dilatation. The terminal ileum is normal. The appendix is best seen on coronal images and is unremarkable. No gross colonic mass. No colonic wall thickening. Vascular/Lymphatic: No abdominal aortic aneurysm. No abdominal aortic atherosclerotic calcification. There is no gastrohepatic or hepatoduodenal ligament lymphadenopathy. No retroperitoneal or mesenteric lymphadenopathy. 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 Splenic vein may well be occluded. Reproductive: Fiducial markers noted in the prostate gland. Other: No intraperitoneal free fluid. Musculoskeletal: No worrisome lytic or sclerotic osseous abnormality. IMPRESSION: 1. Thrombus is identified in the portosplenic confluence, involving the superior mesenteric vein and portal vein. Splenic vein may well be occluded. 2. Subtle peripancreatic edema with either irregular substantial dilatation of the pancreatic duct in the tail of pancreas versus ill-defined cystic or hypoenhancing pancreatic lesion measuring on the order of 3.1 x 1.8 cm. Imaging features raise concern for pancreatic neoplasm although this could simply represent pancreatitis with parenchymal pseudocyst. Abdominal MRI without and with contrast may prove helpful to further evaluate. 3. 2.2 x 1.4 cm soft tissue lesion or fluid collection involving the posterior wall of the gastric fundus appears to communicate with the pancreatic tail lesion. 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 4. Collapse/consolidative changes in the lower lobes bilaterally, left greater than right. 5. Hepatic steatosis. 6. 10 mm hypoattenuating lesion lower pole left kidney with attenuation too high to be a simple cyst. This may be a cyst complicated by proteinaceous debris or hemorrhage, but neoplasm cannot be excluded. This could also be further evaluated the time of follow-up MRI.  Electronically Signed   By: Misty Stanley M.D.   On: 03/15/2020 17:04   DG Chest Port 1 View  Result Date: 03/04/2020 CLINICAL DATA:  Fever, generalized weakness. EXAM: PORTABLE CHEST 1 VIEW COMPARISON:  Chest x-rays dated 02/07/2020 and 08/13/2018. FINDINGS: Heart size and mediastinal contours are stable. Lungs are clear. No pleural effusion is seen. Osseous structures about the chest are unremarkable. IMPRESSION: No active disease. No evidence of pneumonia or pulmonary edema. Electronically Signed   By: Franki Cabot M.D.   On: 03/04/2020 14:54    Orson Eva, DO  [facility]  If 7PM-7AM, please contact night-coverage [email or web address] Password [email or web address] 03/17/2020, 3:55 PM   LOS: 2 days

## 2020-03-17 NOTE — Care Management Important Message (Signed)
Important Message  Patient Details  Name: Clifford [name] MRN: [id number] Date of Birth: [birth year]   Medicare Important Message Given:  Yes     [name] 03/17/2020, 3:04 PM

## 2020-03-17 NOTE — Progress Notes (Signed)
ANTICOAGULATION CONSULT NOTE   Pharmacy Consult for heparin Indication: portal vein thrombosis    Assessment: 69 yo man with portal vein thrombosis to start heparin therapy.  His PTLC is very low, 37K, and he has a h/o GI bleed.  Okay with heme/onc to start as CT head is negative for bleed.  He was not on anticoagulation PTA. Heparin level 0.39 units/ml   Goal of Therapy:  Heparin level 0.3-0.7 units/ml target lower end of goal Monitor platelets by anticoagulation protocol: Yes   Plan:  Continue heparin infusion at 1100 units/hr (no bolus d/t hx of GIB)  Re-check heparin level 6-8 hours to confirm Daily HL and CBC Monitor for bleeding complications  Lauralee Evener, Ivana Nicastro Poteet 03/17/2020,2:03 AM

## 2020-03-18 DIAGNOSIS — K859 Acute pancreatitis without necrosis or infection, unspecified: Secondary | ICD-10-CM

## 2020-03-18 DIAGNOSIS — R945 Abnormal results of liver function studies: Secondary | ICD-10-CM

## 2020-03-18 DIAGNOSIS — A414 Sepsis due to anaerobes: Secondary | ICD-10-CM

## 2020-03-18 DIAGNOSIS — I10 Essential (primary) hypertension: Secondary | ICD-10-CM

## 2020-03-18 LAB — CBC
HCT: 23.1 % — ABNORMAL LOW (ref 39.0–52.0)
Hemoglobin: 7.4 g/dL — ABNORMAL LOW (ref 13.0–17.0)
MCH: 29.7 pg (ref 26.0–34.0)
MCHC: 32 g/dL (ref 30.0–36.0)
MCV: 92.8 fL (ref 80.0–100.0)
Platelets: 54 10*3/uL — ABNORMAL LOW (ref 150–400)
RBC: 2.49 MIL/uL — ABNORMAL LOW (ref 4.22–5.81)
RDW: 15.5 % (ref 11.5–15.5)
WBC: 9.1 10*3/uL (ref 4.0–10.5)
nRBC: 0.4 % — ABNORMAL HIGH (ref 0.0–0.2)

## 2020-03-18 LAB — COMPREHENSIVE METABOLIC PANEL
ALT: 16 U/L (ref 0–44)
AST: 48 U/L — ABNORMAL HIGH (ref 15–41)
Albumin: 2 g/dL — ABNORMAL LOW (ref 3.5–5.0)
Alkaline Phosphatase: 70 U/L (ref 38–126)
Anion gap: 13 (ref 5–15)
BUN: 27 mg/dL — ABNORMAL HIGH (ref 8–23)
CO2: 19 mmol/L — ABNORMAL LOW (ref 22–32)
Calcium: 7.5 mg/dL — ABNORMAL LOW (ref 8.9–10.3)
Chloride: 103 mmol/L (ref 98–111)
Creatinine, Ser: 1.45 mg/dL — ABNORMAL HIGH (ref 0.61–1.24)
GFR calc Af Amer: 57 mL/min — ABNORMAL LOW (ref >60)
GFR calc non Af Amer: 49 mL/min — ABNORMAL LOW (ref >60)
Glucose, Bld: 111 mg/dL — ABNORMAL HIGH (ref 70–99)
Potassium: 3.1 mmol/L — ABNORMAL LOW (ref 3.5–5.1)
Sodium: 135 mmol/L (ref 135–145)
Total Bilirubin: 1.6 mg/dL — ABNORMAL HIGH (ref 0.3–1.2)
Total Protein: 5.3 g/dL — ABNORMAL LOW (ref 6.5–8.1)

## 2020-03-18 LAB — CULTURE, BLOOD (ROUTINE X 2)
Special Requests: ADEQUATE
Special Requests: ADEQUATE

## 2020-03-18 LAB — LIPASE, BLOOD: Lipase: 563 U/L — ABNORMAL HIGH (ref 11–51)

## 2020-03-18 LAB — HEPARIN INDUCED PLATELET AB (HIT ANTIBODY): Heparin Induced Plt Ab: 0.115 OD (ref 0.000–0.400)

## 2020-03-18 LAB — AMYLASE: Amylase: 520 U/L — ABNORMAL HIGH (ref 28–100)

## 2020-03-18 LAB — HEPARIN LEVEL (UNFRACTIONATED): Heparin Unfractionated: 0.34 IU/mL (ref 0.30–0.70)

## 2020-03-18 LAB — PROCALCITONIN: Procalcitonin: 4.78 ng/mL

## 2020-03-18 MED ORDER — POTASSIUM CHLORIDE 20 MEQ/15ML (10%) PO SOLN
40.0000 meq | Freq: Once | ORAL | Status: AC
  Administered 2020-03-18: 40 meq via ORAL
  Filled 2020-03-18: qty 30

## 2020-03-18 MED ORDER — LOPERAMIDE HCL 2 MG PO CAPS
2.0000 mg | ORAL_CAPSULE | Freq: Two times a day (BID) | ORAL | Status: DC
  Administered 2020-03-18 – 2020-03-20 (×4): 2 mg via ORAL
  Filled 2020-03-18 (×5): qty 1

## 2020-03-18 NOTE — Consult Note (Signed)
[facility] Progress Note  Name: Clifford [name]      MRN: [id number]    Location: A317/A317-01  Date: 03/18/2020 Time:6:21 PM   Subjective: Interval History:Clifford [name] was seen today.  He is lying in bed.  Does not report any major abdominal pain.  Denies any nausea or vomiting.  Reportedly had bowel movement today with loose stools.  Denies any bleeding per rectum or melena.  No nosebleeds or hematuria reported.  Objective: Vital signs in last 24 hours: Temp:  [99.6 F (37.6 C)-101.1 F (38.4 C)] 100.5 F (38.1 C) (05/06 1321) Pulse Rate:  [88-98] 98 (05/06 1321) Resp:  [18-20] 18 (05/06 1321) BP: (133-149)/(77-85) 149/85 (05/06 1321) SpO2:  [94 %-97 %] 97 % (05/06 1321)    Intake/Output from previous day: 05/05 0800 - 05/06 0759 In: 240 [P.O.:240] Out: -     Intake/Output this shift: Total I/O In: -  Out: 300 [Urine:300]   PHYSICAL EXAM: BP (!) 149/85 (BP Location: Right Arm)   Pulse 98   Temp (!) 100.5 F (38.1 C) (Oral)   Resp 18   Ht 5\' 6"  (1.676 m)   Wt 135 lb (61.2 kg)   SpO2 97%   BMI 21.79 kg/m  General appearance: alert, cooperative and appears stated age Lungs: clear to auscultation bilaterally Heart: regular rate and rhythm Abdomen: Soft, nontender.  No masses or palpable organomegaly. Extremities: No edema or cyanosis. Skin: No skin rashes or ecchymosis. Lymph nodes: Cervical, supraclavicular, and axillary nodes normal. Neurologic: Grossly normal   Studies/Results: Results for orders placed or performed during the hospital encounter of 03/15/20 (from the past 48 hour(s))  Lipase, blood     Status: Abnormal   Collection Time: 03/17/20  1:31 AM  Result Value Ref Range   Lipase 1,236 (H) 11 - 51 U/L    Comment: RESULTS CONFIRMED BY MANUAL DILUTION Performed at [facility], [address], [place], [place] [address]   CBC     Status: Abnormal   Collection Time: 03/17/20  1:31 AM  Result Value Ref Range   WBC 10.2 4.0 -  10.5 K/uL   RBC 2.82 (L) 4.22 - 5.81 MIL/uL   Hemoglobin 8.3 (L) 13.0 - 17.0 g/dL   HCT 26.6 (L) 39.0 - 52.0 %   MCV 94.3 80.0 - 100.0 fL   MCH 29.4 26.0 - 34.0 pg   MCHC 31.2 30.0 - 36.0 g/dL   RDW 15.3 11.5 - 15.5 %   Platelets 36 (L) 150 - 400 K/uL    Comment: CONSISTENT WITH PREVIOUS RESULT Immature Platelet Fraction may be clinically indicated, consider ordering this additional test [id number]    nRBC 0.0 0.0 - 0.2 %    Comment: Performed at [facility], [address], [place], [place] [id number]  Basic metabolic panel     Status: Abnormal   Collection Time: 03/17/20  1:31 AM  Result Value Ref Range   Sodium 133 (L) 135 - 145 mmol/L   Potassium 3.4 (L) 3.5 - 5.1 mmol/L   Chloride 104 98 - 111 mmol/L   CO2 16 (L) 22 - 32 mmol/L   Glucose, Bld 107 (H) 70 - 99 mg/dL    Comment: Glucose reference range applies only to samples taken after fasting for at least 8 hours.   BUN 31 (H) 8 - 23 mg/dL   Creatinine, Ser 1.76 (H) 0.61 - 1.24 mg/dL   Calcium 7.3 (L) 8.9 - 10.3 mg/dL   GFR calc non Af  Amer 39 (L) >60 mL/min   GFR calc Af Amer 45 (L) >60 mL/min   Anion gap 13 5 - 15    Comment: Performed at [facility], [address] [place], [place] [address]  Lactate dehydrogenase     Status: None   Collection Time: 03/17/20  1:31 AM  Result Value Ref Range   LDH 177 98 - 192 U/L    Comment: Performed at [facility], [address], [place], [place] [address]  Folate     Status: None   Collection Time: 03/17/20  1:31 AM  Result Value Ref Range   Folate 22.6 >5.9 ng/mL    Comment: Performed at [facility], [address], [place], [place] [address]  Hepatitis B surface antibody     Status: None   Collection Time: 03/17/20  1:31 AM  Result Value Ref Range   Hep B S Ab NON REACTIVE NON REACTIVE    Comment: (NOTE) Inconsistent with immunity, less than 10 mIU/mL. Performed at [facility], [place] [address] [place], [place] [address]   Hepatitis B surface antigen     Status:  None   Collection Time: 03/17/20  1:31 AM  Result Value Ref Range   Hepatitis B Surface Ag NON REACTIVE NON REACTIVE    Comment: Performed at [facility] [address], [place], [place] [address]  Hepatitis B core antibody, IgM     Status: None   Collection Time: 03/17/20  1:31 AM  Result Value Ref Range   Hep B C IgM NON REACTIVE NON REACTIVE    Comment: Performed at [facility] [address], [place], [place] [address]  Heparin induced platelet Ab (HIT antibody)     Status: None   Collection Time: 03/17/20  1:31 AM  Result Value Ref Range   Heparin Induced Plt Ab 0.115 0.000 - 0.400 OD    Comment: (NOTE) Performed At: [facility] [place], [place] [id number] Rush Farmer MD [id number]   Ferritin     Status: None   Collection Time: 03/17/20  1:31 AM  Result Value Ref Range   Ferritin 76 24 - 336 ng/mL    Comment: Performed at [facility], [address] [place], [place] [address]  Iron and TIBC     Status: Abnormal   Collection Time: 03/17/20  1:31 AM  Result Value Ref Range   Iron 7 (L) 45 - 182 ug/dL   TIBC 276 250 - 450 ug/dL   Saturation Ratios 3 (L) 17.9 - 39.5 %   UIBC 269 ug/dL    Comment: Performed at [facility], [address] [place], [place] [address]  Heparin level (unfractionated)     Status: None   Collection Time: 03/17/20  1:31 AM  Result Value Ref Range   Heparin Unfractionated 0.39 0.30 - 0.70 IU/mL    Comment: (NOTE) If heparin results are below expected values, and patient dosage has  been confirmed, suggest follow up testing of antithrombin III levels. Performed at [facility], [address] [place],  [address]   Heparin level (unfractionated)     Status: None   Collection Time: 03/17/20  8:07 AM  Result Value Ref Range   Heparin Unfractionated 0.44 0.30 - 0.70 IU/mL    Comment: (NOTE) If heparin results are below expected values, and patient dosage has  been confirmed, suggest follow up  testing of antithrombin III levels. Performed at [facility], [address],  [place], [place] [address]   Amylase     Status: Abnormal   Collection Time: 03/17/20  8:07 AM  Result Value Ref Range   Amylase 519 (H) 28 - 100 U/L    Comment: Performed at [facility], [address], [place], [place] [address]  CBC     Status: Abnormal   Collection Time: 03/18/20  4:44 AM  Result Value Ref Range   WBC 9.1 4.0 - 10.5 K/uL   RBC 2.49 (L) 4.22 - 5.81 MIL/uL   Hemoglobin 7.4 (L) 13.0 - 17.0 g/dL   HCT 23.1 (L) 39.0 - 52.0 %   MCV 92.8 80.0 - 100.0 fL   MCH 29.7 26.0 - 34.0 pg   MCHC 32.0 30.0 - 36.0 g/dL   RDW 15.5 11.5 - 15.5 %   Platelets 54 (L) 150 - 400 K/uL    Comment: SPECIMEN CHECKED FOR CLOTS DELTA CHECK NOTED Immature Platelet Fraction may be clinically indicated, consider ordering this additional test [id number]    nRBC 0.4 (H) 0.0 - 0.2 %    Comment: Performed at [facility], [address], [place], [place] [address]  Comprehensive metabolic panel     Status: Abnormal   Collection Time: 03/18/20  4:44 AM  Result Value Ref Range   Sodium 135 135 - 145 mmol/L   Potassium 3.1 (L) 3.5 - 5.1 mmol/L   Chloride 103 98 - 111 mmol/L   CO2 19 (L) 22 - 32 mmol/L   Glucose, Bld 111 (H) 70 - 99 mg/dL    Comment: Glucose reference range applies only to samples taken after fasting for at least 8 hours.   BUN 27 (H) 8 - 23 mg/dL   Creatinine, Ser 1.45 (H) 0.61 - 1.24 mg/dL   Calcium 7.5 (L) 8.9 - 10.3 mg/dL   Total Protein 5.3 (L) 6.5 - 8.1 g/dL   Albumin 2.0 (L) 3.5 - 5.0 g/dL   AST 48 (H) 15 - 41 U/L   ALT 16 0 - 44 U/L   Alkaline Phosphatase 70 38 - 126 U/L   Total Bilirubin 1.6 (H) 0.3 - 1.2 mg/dL   GFR calc non Af Amer 49 (L) >60 mL/min   GFR calc Af Amer 57 (L) >60 mL/min   Anion gap 13 5 - 15    Comment: Performed at [facility], [address], [place], [place] [address]  Amylase     Status: Abnormal   Collection Time: 03/18/20  4:44 AM  Result Value Ref Range    Amylase 520 (H) 28 - 100 U/L    Comment: Performed at [facility], [address] [place], [place] [address]  Lipase, blood     Status: Abnormal   Collection Time: 03/18/20  4:44 AM  Result Value Ref Range   Lipase 563 (H) 11 - 51 U/L    Comment: RESULTS CONFIRMED BY MANUAL DILUTION Performed at [facility], [address], [place], [place] [address]   Culture, blood (Routine X 2) w Reflex to ID Panel     Status: None (Preliminary result)   Collection Time: 03/18/20  7:26 AM   Specimen: BLOOD RIGHT HAND  Result Value Ref Range   Specimen Description BLOOD RIGHT HAND    Special Requests      BOTTLES DRAWN AEROBIC AND ANAEROBIC Blood Culture results may not be optimal due to an inadequate volume of blood received in culture bottles Performed at [facility], [address], [place], [place] [address]    Culture PENDING  Report Status PENDING   Culture, blood (Routine X 2) w Reflex to ID Panel     Status: None (Preliminary result)   Collection Time: 03/18/20  7:26 AM   Specimen: BLOOD LEFT HAND  Result Value Ref Range   Specimen Description BLOOD LEFT HAND    Special Requests      BOTTLES DRAWN AEROBIC AND ANAEROBIC Blood Culture adequate volume Performed at [facility], [address], [place], [place] [address]    Culture PENDING    Report Status PENDING   Procalcitonin - Baseline     Status: None   Collection Time: 03/18/20  7:26 AM  Result Value Ref Range   Procalcitonin 4.78 ng/mL    Comment:        Interpretation: PCT > 2 ng/mL: Systemic infection (sepsis) is likely, unless other causes are known. (NOTE)       Sepsis PCT Algorithm           Lower Respiratory Tract                                      Infection PCT Algorithm    ----------------------------     ----------------------------         PCT < 0.25 ng/mL                PCT < 0.10 ng/mL         Strongly encourage             Strongly discourage   discontinuation of antibiotics    initiation of antibiotics     ----------------------------     -----------------------------       PCT 0.25 - 0.50 ng/mL            PCT 0.10 - 0.25 ng/mL               OR       >80% decrease in PCT            Discourage initiation of                                            antibiotics      Encourage discontinuation           of antibiotics    ----------------------------     -----------------------------         PCT >= 0.50 ng/mL              PCT 0.26 - 0.50 ng/mL               AND       <80% decrease in PCT              Encourage initiation of                                             antibiotics       Encourage continuation           of antibiotics    ----------------------------     -----------------------------        PCT >= 0.50 ng/mL  PCT > 0.50 ng/mL               AND         increase in PCT                  Strongly encourage                                      initiation of antibiotics    Strongly encourage escalation           of antibiotics                                     -----------------------------                                           PCT <= 0.25 ng/mL                                                 OR                                        > 80% decrease in PCT                                     Discontinue / Do not initiate                                             antibiotics Performed at [facility], [address], [place], [place] [address]   Heparin level (unfractionated)     Status: None   Collection Time: 03/18/20  8:07 AM  Result Value Ref Range   Heparin Unfractionated 0.34 0.30 - 0.70 IU/mL    Comment: (NOTE) If heparin results are below expected values, and patient dosage has  been confirmed, suggest follow up testing of antithrombin III levels. Performed at [facility], [address] [place],  [address]    No results found.   MEDICATIONS: I have reviewed the patient's current medications.     Assessment/Plan:  1.  Acute severe  thrombocytopenia: -Etiology thought to be from Klebsiella pneumonia sepsis. -Folic acid is normal.  B12 was normal.  Methylmalonic acid and copper levels are pending.  Hepatitis B and C serology was negative.  HIT antibody was negative. -CT abdomen and pelvis on 03/15/2020 showed edema in the splenic hilum, but otherwise unremarkable spleen size. -Platelet count today improved to 54. -If the platelet count stays 50k and without evidence of bleeding, may transition him to direct oral anticoagulant.  2.  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  Splenic vein may also be occluded. -Likely etiology is chronic pancreatitis versus mass. -Currently on IV heparin.  May transition to direct oral anticoagulant if platelet count remains stable above 50 K for the next day or 2.  3.  Normocytic anemia: -This is from combination of CKD and iron deficiency. -EGD on 03/03/2020 showed normal esophagus, 2 cm hiatal hernia, gastritis, nonbleeding gastric ulcers with no stigmata of bleeding, normal duodenal bulb and second part of the duodenum. -Iron panel on 03/18/2020 shows a percent saturation of 3 and a ferritin of 76. -Today hemoglobin dropped to 7.4. -Consider Feraheme infusion x2.  4.  Klebsiella pneumonia bacteremia: -He is on ceftriaxone 2 g daily.  Source is likely pneumonia. -Blood cultures were repeated today.  5.  Possible pancreatic mass: -Ill-defined cystic or hypoenhancing pancreatic lesion measuring 3.1 x 1.8 cm.  Differential includes irregular dilation of the pancreatic duct in the tail of the pancreas. -Other differential includes pseudocyst from chronic pancreatitis. -CA 19-9 levels are pending. -Follow-up with MRI of the abdomen with and without contrast as outpatient.   All questions were answered. The patient knows to call the clinic with any problems, questions or  concerns. We can certainly see the patient much sooner if necessary.    Derek Jack

## 2020-03-18 NOTE — Progress Notes (Signed)
ANTICOAGULATION CONSULT NOTE -   Pharmacy Consult for heparin Indication: portal vein thrombosis  Allergies  Allergen Reactions  . Lisinopril Swelling    Patient Measurements: Height: 5\' 6"  (167.6 cm) Weight: 61.2 kg (135 lb) IBW/kg (Calculated) : 63.8 Heparin Dosing Weight: 61.2 kg  Vital Signs: Temp: 99.6 F (37.6 C) (05/06 0657) Temp Source: Oral (05/06 0657) BP: 133/80 (05/06 0657) Pulse Rate: 88 (05/06 0657)  Labs: Recent Labs    03/15/20 2223 03/15/20 2223 03/16/20 0618 03/16/20 0831 03/17/20 0131 03/17/20 0807 03/18/20 0444 03/18/20 0807  HGB 8.7*   < > 9.5*   < > 8.3*  --  7.4*  --   HCT 27.4*   < > 31.1*  --  26.6*  --  23.1*  --   PLT 37*   < > 46*  --  36*  --  54*  --   APTT 45*  --  78*  --   --   --   --   --   LABPROT 15.8*  --  15.2  --   --   --   --   --   INR 1.3*  --  1.2  --   --   --   --   --   HEPARINUNFRC  --   --   --    < > 0.39 0.44  --  0.34  CREATININE  --   --  1.92*  --  1.76*  --  1.45*  --    < > = values in this interval not displayed.    Estimated Creatinine Clearance: 41.6 mL/min (A) (by C-G formula based on SCr of 1.45 mg/dL (H)).    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   he has a h/o GI bleed.  Okay with heme/onc to start as CT head is negative for bleed.  He was not on anticoagulation PTA.  HL 0.34- therapeutic Platelets 54 hgb 7.4  Goal of Therapy:  Heparin level 0.3-0.7 units/ml target lower end of goal Monitor platelets by anticoagulation protocol: Yes     Plan:  Continue heparin infusion rate at 1100 units/hr  heparin level daily Daily HL and CBC Monitor for bleeding complications  Margot Ables, PharmD Clinical Pharmacist 03/18/2020 8:47 AM

## 2020-03-18 NOTE — Progress Notes (Signed)
Subjective:  Patient states only time he has abdominal pain is when he takes a deep breath.  Pain is primarily in left lower quadrant.  He denies nausea vomiting or chest pain.  Patient remains with diarrhea.  Rectal tube placed because he was incontinent of loose stool.  No melena or rectal bleeding reported.  Objective: Blood pressure (!) 149/85, pulse 98, temperature (!) 100.5 F (38.1 C), temperature source Oral, resp. rate 18, height '5\' 6"'$  (1.676 m), weight 61.2 kg, SpO2 97 %. Patient is alert and in no acute distress. He does not have tremors. Cardiac exam with regular rhythm normal S1 and S2.  No murmur gallop noted. Auscultation lungs reveal vesicular breath sounds bilaterally.  Do not hear any bronchial breathing. Abdomen is full.  Bowel sounds are hyperactive.  On palpation abdomen is soft.  He has mild tenderness below the left costal margin on deep palpation.  Liver edge is palpable and is firm but not tender. No peripheral edema noted.  Labs/studies Results:   CBC Latest Ref Rng & Units 03/18/2020 03/17/2020 03/16/2020  WBC 4.0 - 10.5 K/uL 9.1 10.2 9.2  Hemoglobin 13.0 - 17.0 g/dL 7.4(L) 8.3(L) 9.5(L)  Hematocrit 39.0 - 52.0 % 23.1(L) 26.6(L) 31.1(L)  Platelets 150 - 400 K/uL 54(L) 36(L) 46(L)    CMP Latest Ref Rng & Units 03/18/2020 03/17/2020 03/16/2020  Glucose 70 - 99 mg/dL 111(H) 107(H) 105(H)  BUN 8 - 23 mg/dL 27(H) 31(H) 34(H)  Creatinine 0.61 - 1.24 mg/dL 1.45(H) 1.76(H) 1.92(H)  Sodium 135 - 145 mmol/L 135 133(L) 136  Potassium 3.5 - 5.1 mmol/L 3.1(L) 3.4(L) 4.0  Chloride 98 - 111 mmol/L 103 104 102  CO2 22 - 32 mmol/L 19(L) 16(L) 19(L)  Calcium 8.9 - 10.3 mg/dL 7.5(L) 7.3(L) 7.8(L)  Total Protein 6.5 - 8.1 g/dL 5.3(L) - 6.3(L)  Total Bilirubin 0.3 - 1.2 mg/dL 1.6(H) - 2.5(H)  Alkaline Phos 38 - 126 U/L 70 - 70  AST 15 - 41 U/L 48(H) - 58(H)  ALT 0 - 44 U/L 16 - 23    Hepatic Function Latest Ref Rng & Units 03/18/2020 03/16/2020 03/15/2020  Total Protein 6.5 - 8.1 g/dL  5.3(L) 6.3(L) 6.7  Albumin 3.5 - 5.0 g/dL 2.0(L) 2.7(L) 3.0(L)  AST 15 - 41 U/L 48(H) 58(H) 62(H)  ALT 0 - 44 U/L '16 23 26  '$ Alk Phosphatase 38 - 126 U/L 70 70 71  Total Bilirubin 0.3 - 1.2 mg/dL 1.6(H) 2.5(H) 2.9(H)  Bilirubin, Direct 0.0 - 0.2 mg/dL - - -     Serum amylase 520.  It was 519 yesterday. Serum lipase 563.  It was 1236 yesterday  C. difficile testing negative for antigen and toxin.  GI pathogen panel is pending.   Assessment:  #1.  Portal/splenic and SMV thrombosis.  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  He possibly has experienced pancreatitis in the past without a definite diagnosis.  Day 3 on heparin infusion.  He will be transitioned to oral agent prior to discharge.  #2.  Pancreatitis.  Suspect acute on chronic pancreatitis.  Significant drop in serum lipase in the last 24 hours.  Serum amylase remains mildly elevated and may be a clue that he has pseudocyst and set of cystic lesion or tumor.  He is tolerating clear liquids.  Will advance diet to full liquids and see how he does.  #3.  History of peptic ulcer disease.  Peptic ulcer disease was diagnosed about 2 weeks ago when he presented with melena and anemia.  H. pylori serology was negative.  Patient is on double dose PPI.   #4.  Klebsiella sepsis.  Blood cultures positive from 2 days ago.  Source would appear to be pneumonia given that he has lower lobe consolidation.  Surprisingly he has no respiratory symptoms.  #5.  Diarrhea.  Patient remains with diarrhea.  Stool testing negative for C. difficile.  GI pathogen panel is pending.  He was incontinent and therefore rectal tube placed.  He will benefit from low-dose loperamide.  #6.  Anemia.  Patient's H&H is gradually drifting down.  No evidence of overt GI bleed.  He may need PRBC if hemoglobin drops any further.  #7.  Acute on chronic kidney disease.  Renal function may be back to his baseline.  #8.  Thrombocytopenia is stable.  Platelet count has improved over  the last 2 days.  #9.  Alcoholic liver disease.  Based on imaging studies and blood work he has alcoholic hepatitis.  He is on multivitamin, folic acid and thiamine.  #10.  Malnutrition.  Secondary to acute and chronic illness.  Serum albumin remains low.  Recommendations:  Loperamide 2 mg p.o. now and then twice daily for total of 6 doses. Advance diet to full liquids. Repeat lab in a.m. to include CBC metabolic 7 and serum lipase.

## 2020-03-18 NOTE — Progress Notes (Signed)
PROGRESS NOTE  Clifford [name] [id number] DOB: [birth year] DOA: 03/15/2020 PCP: [name], FNP   Brief History:  69 year old male with a history of prostate cancer, alcohol abuse, thrombocytopenia, hypertension, hyperlipidemia, GI bleed presenting with generalized weakness and diarrhea.  He denied any fevers, chills but has some dyspnea on exertion.  There is no nausea or vomiting.  There is no hematochezia or melena.  The patient normally drinks 1 pint of gin on a daily basis with the last drink on 03/11/2020.  His wife had stated that the patient had been confused over the last several days prior to admission.  He has had poor oral intake.  CT of the abdomen and pelvis showed thrombus in the SMV and portal vein as well as splenic vein.  Hematology was consulted.  The patient was started on IV heparin.  There was subtle peripancreatic edema with dilatation of the pancreatic duct in the tail with a hypoenhancing lesion.  There was concern for neoplasm versus pseudocyst versus pancreatitis.  There was bilateral lower lobe consolidation.  Assessment/Plan: Sepsis -Present at the time of admission -Secondary to bacteremia and pneumonia -Lactic acid peaked 1.8 -PCT 4.78 -lactic 1.8 -Continue ceftriaxone 2 grams daily  Klebsiella bacteremia -Continue ceftriaxone 2 g daily pending final culture data -Source is  Pneumonia -recurrent fever likely due to pancreatitis -5/6 repeat blood culture  Lobar pneumonia -Continue ceftriaxone  -Stable on room air  Portal vein and SMV thrombosis -Continue IV heparin -Appreciate hematology consult -Monitor platelet counts -Splenic vein may also be occluded -obtain echo  Diarrhea -C. difficile negative -Stool pathogen panel--pending  Acute on chronic renal failure--CKD stage IIIa -Secondary to sepsis and volume depletion -Baseline creatinine 1.0-1.3 -Presented with serum creatinine 2.44 -Continue IV fluids  Acute on  chronic pancreatitis -tolerating clears>>full liquid -diet advancement per GI -check lipid panel -lipase peaked 1236 -continue IVF  Thrombocytopenia -Multifactorial including myelosuppression from his alcohol with acute worsening secondary to sepsis and acute clot  Pancreatic tail hypodensity/mass -Appreciate GI consult--suspect the changes are likely due to chronic pancreatitis with small pseudocysts although cystic pancreatic neoplasm remains in the differential diagnosis  Alcohol abuse -Alcohol withdrawal protocol -no signs of withdrawal  GOC  -full code, full scope of care    Disposition Plan: Patient From: Home D/C Place: Home- 2-3  Days Barriers: Not Clinically Stable--not tolerating diet, persistent fever on IV abx; renal function not at baseline  Family Communication:   Spouse updated at bedside 5/5  Consultants:  GI, hematology, palliative  Code Status:  FULL   DVT Prophylaxis:  IV Heparin    Procedures: As Listed in Progress Note Above  Antibiotics: Ceftriaxone 5/3>>>   Subjective: Patient denies fevers, chills, headache, chest pain, dyspnea, nausea, vomiting,abdominal pain, dysuria, hematuria, hematochezia, and melena.  Continues to have loose stool   Objective: Vitals:   03/17/20 2005 03/18/20 0003 03/18/20 0657 03/18/20 1321  BP:  140/77 133/80 (!) 149/85  Pulse:  92 88 98  Resp:  20 18 18   Temp:  (!) 101.1 F (38.4 C) 99.6 F (37.6 C) (!) 100.5 F (38.1 C)  TempSrc:  Oral Oral Oral  SpO2: 96% 95% 94% 97%  Weight:      Height:        Intake/Output Summary (Last 24 hours) at 03/18/2020 1731 Last data filed at 03/18/2020 1321 Gross per 24 hour  Intake 240 ml  Output 300 ml  Net -60 ml   Weight  change:  Exam:   General:  Pt is alert, follows commands appropriately, not in acute distress  HEENT: No icterus, No thrush, No neck mass, [place]/AT  Cardiovascular: RRR, S1/S2, no rubs, no gallops  Respiratory: bibasilar  crackles.  No wheeze  Abdomen: Soft/+BS, epigastric tender, non distended, no guarding  Extremities: No edema, No lymphangitis, No petechiae, No rashes, no synovitis   Data Reviewed: I have personally reviewed following labs and imaging studies Basic Metabolic Panel: Recent Labs  Lab 03/15/20 1222 03/16/20 0618 03/17/20 0131 03/18/20 0444  NA 132* 136 133* 135  K 4.1 4.0 3.4* 3.1*  CL 97* 102 104 103  CO2 20* 19* 16* 19*  GLUCOSE 137* 105* 107* 111*  BUN 35* 34* 31* 27*  CREATININE 2.44* 1.92* 1.76* 1.45*  CALCIUM 8.2* 7.8* 7.3* 7.5*   Liver Function Tests: Recent Labs  Lab 03/15/20 1222 03/16/20 0618 03/18/20 0444  AST 62* 58* 48*  ALT 26 23 16   ALKPHOS 71 70 70  BILITOT 2.9* 2.5* 1.6*  PROT 6.7 6.3* 5.3*  ALBUMIN 3.0* 2.7* 2.0*   Recent Labs  Lab 03/15/20 1222 03/16/20 0618 03/17/20 0131 03/17/20 0807 03/18/20 0444  LIPASE 655* 820* 1,236*  --  563*  AMYLASE  --   --   --  519* 520*   No results for input(s): AMMONIA in the last 168 hours. Coagulation Profile: Recent Labs  Lab 03/15/20 2223 03/16/20 0618  INR 1.3* 1.2   CBC: Recent Labs  Lab 03/15/20 1222 03/15/20 2223 03/16/20 0618 03/17/20 0131 03/18/20 0444  WBC 9.8 8.7 9.2 10.2 9.1  HGB 9.9* 8.7* 9.5* 8.3* 7.4*  HCT 31.5* 27.4* 31.1* 26.6* 23.1*  MCV 95.7 94.2 97.2 94.3 92.8  PLT 46* 37* 46* 36* 54*   Cardiac Enzymes: No results for input(s): CKTOTAL, CKMB, CKMBINDEX, TROPONINI in the last 168 hours. BNP: Invalid input(s): POCBNP CBG: Recent Labs  Lab 03/16/20 0740  GLUCAP 105*   HbA1C: No results for input(s): HGBA1C in the last 72 hours. Urine analysis:    Component Value Date/Time   COLORURINE YELLOW 03/04/2020 1609   APPEARANCEUR CLEAR 03/04/2020 1609   LABSPEC 1.015 03/04/2020 1609   PHURINE 5.0 03/04/2020 1609   GLUCOSEU NEGATIVE 03/04/2020 1609   HGBUR SMALL (A) 03/04/2020 1609   BILIRUBINUR NEGATIVE 03/04/2020 1609   KETONESUR NEGATIVE 03/04/2020 1609    PROTEINUR 30 (A) 03/04/2020 1609   NITRITE NEGATIVE 03/04/2020 1609   LEUKOCYTESUR NEGATIVE 03/04/2020 1609   Sepsis Labs: @LABRCNTIP (procalcitonin:4,lacticidven:4) ) Recent Results (from the past 240 hour(s))  Respiratory Panel by RT PCR (Flu A&B, Covid) - Nasopharyngeal Swab     Status: None   Collection Time: 03/15/20  3:52 PM   Specimen: Nasopharyngeal Swab  Result Value Ref Range Status   SARS Coronavirus 2 by RT PCR NEGATIVE NEGATIVE Final    Comment: (NOTE) SARS-CoV-2 target nucleic acids are NOT DETECTED. The SARS-CoV-2 RNA is generally detectable in upper respiratoy specimens during the acute phase of infection. The lowest concentration of SARS-CoV-2 viral copies this assay can detect is 131 copies/mL. A negative result does not preclude SARS-Cov-2 infection and should not be used as the sole basis for treatment or other patient management decisions. A negative result may occur with  improper specimen collection/handling, submission of specimen other than nasopharyngeal swab, presence of viral mutation(s) within the areas targeted by this assay, and inadequate number of viral copies (<131 copies/mL). A negative result must be combined with clinical observations, patient history, and epidemiological information. The  expected result is Negative. Fact Sheet for Patients:  PinkCheek.be Fact Sheet for Healthcare Providers:  GravelBags.it This test is not yet ap proved or cleared by the [place] FDA and  has been authorized for detection and/or diagnosis of SARS-CoV-2 by FDA under an Emergency Use Authorization (EUA). This EUA will remain  in effect (meaning this test can be used) for the duration of the COVID-19 declaration under Section 564(b)(1) of the Act, 21 U.S.C. section 360bbb-3(b)(1), unless the authorization is terminated or revoked sooner.    Influenza A by PCR NEGATIVE NEGATIVE Final   Influenza B by  PCR NEGATIVE NEGATIVE Final    Comment: (NOTE) The Xpert Xpress SARS-CoV-2/FLU/RSV assay is intended as an aid in  the diagnosis of influenza from Nasopharyngeal swab specimens and  should not be used as a sole basis for treatment. Nasal washings and  aspirates are unacceptable for Xpert Xpress SARS-CoV-2/FLU/RSV  testing. Fact Sheet for Patients: PinkCheek.be Fact Sheet for Healthcare Providers: GravelBags.it This test is not yet approved or cleared by the [place] FDA and  has been authorized for detection and/or diagnosis of SARS-CoV-2 by  FDA under an Emergency Use Authorization (EUA). This EUA will remain  in effect (meaning this test can be used) for the duration of the  Covid-19 declaration under Section 564(b)(1) of the Act, 21  U.S.C. section 360bbb-3(b)(1), unless the authorization is  terminated or revoked. Performed at [facility], [address] [place], [place] [address]   Blood culture (routine x 2)     Status: Abnormal   Collection Time: 03/15/20  3:52 PM   Specimen: BLOOD RIGHT WRIST  Result Value Ref Range Status   Specimen Description   Final    BLOOD RIGHT WRIST Performed at [facility], [address], [place], [place] [address]    Special Requests   Final    BOTTLES DRAWN AEROBIC AND ANAEROBIC Blood Culture adequate volume Performed at [facility], [address] [place], [place] [address]    Culture  Setup Time   Final    IN BOTH AEROBIC AND ANAEROBIC BOTTLES GRAM NEGATIVE RODS Gram Stain Report Called to,Read Back By and Verified With: T VILLALOBOS,RN @0433  03/16/20 MKELLY    Culture KLEBSIELLA PNEUMONIAE (A)  Final   Report Status 03/18/2020 FINAL  Final   Organism ID, Bacteria KLEBSIELLA PNEUMONIAE  Final      Susceptibility   Klebsiella pneumoniae - MIC*    AMPICILLIN >=32 RESISTANT Resistant     CEFAZOLIN 16 SENSITIVE Sensitive     CEFEPIME <=1 SENSITIVE Sensitive     CEFTAZIDIME  <=1 SENSITIVE Sensitive     CEFTRIAXONE <=1 SENSITIVE Sensitive     CIPROFLOXACIN <=0.25 SENSITIVE Sensitive     GENTAMICIN <=1 SENSITIVE Sensitive     IMIPENEM <=0.25 SENSITIVE Sensitive     TRIMETH/SULFA <=20 SENSITIVE Sensitive     AMPICILLIN/SULBACTAM >=32 RESISTANT Resistant     PIP/TAZO 32 INTERMEDIATE Intermediate     * KLEBSIELLA PNEUMONIAE  Blood Culture ID Panel (Reflexed)     Status: Abnormal   Collection Time: 03/15/20  3:52 PM  Result Value Ref Range Status   Enterococcus species NOT DETECTED NOT DETECTED Final   Listeria monocytogenes NOT DETECTED NOT DETECTED Final   Staphylococcus species NOT DETECTED NOT DETECTED Final   Staphylococcus aureus (BCID) NOT DETECTED NOT DETECTED Final   Streptococcus species NOT DETECTED NOT DETECTED Final   Streptococcus agalactiae NOT DETECTED NOT DETECTED Final   Streptococcus pneumoniae NOT DETECTED NOT DETECTED  Final   Streptococcus pyogenes NOT DETECTED NOT DETECTED Final   Acinetobacter baumannii NOT DETECTED NOT DETECTED Final   Enterobacteriaceae species DETECTED (A) NOT DETECTED Final    Comment: Enterobacteriaceae represent a large family of gram-negative bacteria, not a single organism. CRITICAL RESULT CALLED TO, READ BACK BY AND VERIFIED WITH: Deborra Medina PharmD 9:10 03/16/20 ([email or web address])    Enterobacter cloacae complex NOT DETECTED NOT DETECTED Final   Escherichia coli NOT DETECTED NOT DETECTED Final   Klebsiella oxytoca NOT DETECTED NOT DETECTED Final   Klebsiella pneumoniae DETECTED (A) NOT DETECTED Final    Comment: CRITICAL RESULT CALLED TO, READ BACK BY AND VERIFIED WITH: Deborra Medina PharmD 9:10 03/16/20 ([email or web address])    Proteus species NOT DETECTED NOT DETECTED Final   Serratia marcescens NOT DETECTED NOT DETECTED Final   Carbapenem resistance NOT DETECTED NOT DETECTED Final   Haemophilus influenzae NOT DETECTED NOT DETECTED Final   Neisseria meningitidis NOT DETECTED NOT DETECTED Final   Pseudomonas aeruginosa NOT DETECTED NOT  DETECTED Final   Candida albicans NOT DETECTED NOT DETECTED Final   Candida glabrata NOT DETECTED NOT DETECTED Final   Candida krusei NOT DETECTED NOT DETECTED Final   Candida parapsilosis NOT DETECTED NOT DETECTED Final   Candida tropicalis NOT DETECTED NOT DETECTED Final    Comment: Performed at [facility], [place] [address] [place], [place] [address]  Blood culture (routine x 2)     Status: Abnormal   Collection Time: 03/15/20  3:53 PM   Specimen: BLOOD RIGHT HAND  Result Value Ref Range Status   Specimen Description   Final    BLOOD RIGHT HAND BOTTLES DRAWN AEROBIC AND ANAEROBIC Performed at [facility], [address] [place], [place] [address]    Special Requests   Final    Blood Culture adequate volume Performed at [facility], [address] [place], [place] [address]    Culture  Setup Time   Final    IN BOTH AEROBIC AND ANAEROBIC BOTTLES GRAM NEGATIVE RODS Gram Stain Report Called to,Read Back By and Verified With: T VILLALOBOS,RN @0432  03/16/20 [facility] Performed at [facility], [address], [place], [place] [address]    Culture (A)  Final    KLEBSIELLA PNEUMONIAE SUSCEPTIBILITIES PERFORMED ON PREVIOUS CULTURE WITHIN THE LAST 5 DAYS. Performed at [facility], [place] [address] [place], [place] [address]    Report Status 03/18/2020 FINAL  Final  C Difficile Quick Screen w PCR reflex     Status: None   Collection Time: 03/16/20  2:37 PM   Specimen: STOOL  Result Value Ref Range Status   C Diff antigen NEGATIVE NEGATIVE Final   C Diff toxin NEGATIVE NEGATIVE Final   C Diff interpretation No C. difficile detected.  Final    Comment: Performed at [facility], [address], [place], [place] [address]  Culture, blood (Routine X 2) w Reflex to ID Panel     Status: None (Preliminary result)   Collection Time: 03/18/20  7:26 AM   Specimen: BLOOD RIGHT HAND  Result Value Ref Range Status   Specimen Description BLOOD RIGHT HAND  Final   Special Requests    Final    BOTTLES DRAWN AEROBIC AND ANAEROBIC Blood Culture results may not be optimal due to an inadequate volume of blood received in culture bottles Performed at [facility], [address] [place], [place] [address]    Culture PENDING  Incomplete   Report Status PENDING  Incomplete  Culture, blood (Routine  X 2) w Reflex to ID Panel     Status: None (Preliminary result)   Collection Time: 03/18/20  7:26 AM   Specimen: BLOOD LEFT HAND  Result Value Ref Range Status   Specimen Description BLOOD LEFT HAND  Final   Special Requests   Final    BOTTLES DRAWN AEROBIC AND ANAEROBIC Blood Culture adequate volume Performed at [facility], [address], [place], [place] [address]    Culture PENDING  Incomplete   Report Status PENDING  Incomplete     Scheduled Meds: . folic acid  1 mg Oral Daily  . magnesium oxide  400 mg Oral Daily  . multivitamin with minerals  1 tablet Oral Daily  . pantoprazole  40 mg Oral BID AC  . sodium chloride flush  3 mL Intravenous Once  . thiamine  100 mg Oral Daily   Continuous Infusions: . cefTRIAXone (ROCEPHIN)  IV 2 g (03/18/20 1654)  . heparin 1,100 Units/hr (03/18/20 0140)  . lactated ringers 75 mL/hr at 03/18/20 1235    Procedures/Studies: DG Chest 2 View  Result Date: 03/15/2020 CLINICAL DATA:  [place] EXAM: CHEST - 2 VIEW COMPARISON:  03/04/2020 FINDINGS: Heart is normal size. Left lower lobe atelectasis or infiltrate noted. No confluent opacity on the right. No effusions or acute bony abnormality. IMPRESSION: Left basilar atelectasis or infiltrate. Electronically Signed   By: Rolm Baptise M.D.   On: 03/15/2020 21:40   CT HEAD WO CONTRAST  Result Date: 03/15/2020 CLINICAL DATA:  Encephalopathy EXAM: CT HEAD WITHOUT CONTRAST TECHNIQUE: Contiguous axial images were obtained from the base of the skull through the vertex without intravenous contrast. COMPARISON:  None. FINDINGS: Brain: There is atrophy and chronic small vessel disease changes. No acute  intracranial abnormality. Specifically, no hemorrhage, hydrocephalus, mass lesion, acute infarction, or significant intracranial injury. Vascular: No hyperdense vessel or unexpected calcification. Skull: No acute calvarial abnormality. Sinuses/Orbits: Visualized paranasal sinuses and mastoids clear. Orbital soft tissues unremarkable. Other: None IMPRESSION: Atrophy, chronic microvascular disease. No acute intracranial abnormality. Electronically Signed   By: Rolm Baptise M.D.   On: 03/15/2020 21:39   CT ABDOMEN PELVIS W CONTRAST  Result Date: 03/15/2020 CLINICAL DATA:  Diarrhea. Abdominal tenderness. Elevated lipase. History of prostate cancer. EXAM: CT ABDOMEN AND PELVIS WITH CONTRAST TECHNIQUE: Multidetector CT imaging of the abdomen and pelvis was performed using the standard protocol following bolus administration of intravenous contrast. CONTRAST:  17mL OMNIPAQUE IOHEXOL 300 MG/ML  SOLN COMPARISON:  None. FINDINGS: Lower chest: Collapse/consolidative changes noted in the lower lobes bilaterally, left greater than right. No substantial pleural effusion. Hepatobiliary: The liver shows diffusely decreased attenuation suggesting fat deposition. No suspicious focal abnormality within the liver parenchyma. There is no evidence for gallstones, gallbladder wall thickening, or pericholecystic fluid. No intrahepatic or extrahepatic biliary dilation. Pancreas: Subtle peripancreatic edema evident with either irregular substantial dilatation of the pancreatic duct in the tail of pancreas versus cystic pancreatic lesion measuring on the order of 3.1 x 1.8 cm. Spleen: Edema noted in the splenic hilum. Spleen otherwise unremarkable. Adrenals/Urinary Tract: No adrenal nodule or mass. Right kidney unremarkable. 10 mm hypoattenuating lesion noted lower pole left kidney with attenuation too high to be a simple cyst. No evidence for hydroureter. The urinary bladder appears normal for the degree of distention. Stomach/Bowel:  Stomach is nondistended. 2.2 x 1.4 cm soft tissue lesion or fluid collection involving the posterior wall of the gastric fundus appears to communicate with the pancreatic tail lesion. Duodenum is normally positioned as is  the ligament of Treitz. No small bowel wall thickening. No small bowel dilatation. The terminal ileum is normal. The appendix is best seen on coronal images and is unremarkable. No gross colonic mass. No colonic wall thickening. Vascular/Lymphatic: No abdominal aortic aneurysm. No abdominal aortic atherosclerotic calcification. There is no gastrohepatic or hepatoduodenal ligament lymphadenopathy. No retroperitoneal or mesenteric lymphadenopathy. 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 Splenic vein may well be occluded. Reproductive: Fiducial markers noted in the prostate gland. Other: No intraperitoneal free fluid. Musculoskeletal: No worrisome lytic or sclerotic osseous abnormality. IMPRESSION: 1. Thrombus is identified in the portosplenic confluence, involving the superior mesenteric vein and portal vein. Splenic vein may well be occluded. 2. Subtle peripancreatic edema with either irregular substantial dilatation of the pancreatic duct in the tail of pancreas versus ill-defined cystic or hypoenhancing pancreatic lesion measuring on the order of 3.1 x 1.8 cm. Imaging features raise concern for pancreatic neoplasm although this could simply represent pancreatitis with parenchymal pseudocyst. Abdominal MRI without and with contrast may prove helpful to further evaluate. 3. 2.2 x 1.4 cm soft tissue lesion or fluid collection involving the posterior wall of the gastric fundus appears to communicate with the pancreatic tail lesion. 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 4. Collapse/consolidative changes in the lower lobes bilaterally, left greater than right. 5. Hepatic steatosis. 6. 10 mm hypoattenuating lesion lower pole left  kidney with attenuation too high to be a simple cyst. This may be a cyst complicated by proteinaceous debris or hemorrhage, but neoplasm cannot be excluded. This could also be further evaluated the time of follow-up MRI. Electronically Signed   By: Misty Stanley M.D.   On: 03/15/2020 17:04   DG Chest Port 1 View  Result Date: 03/04/2020 CLINICAL DATA:  Fever, generalized weakness. EXAM: PORTABLE CHEST 1 VIEW COMPARISON:  Chest x-rays dated 02/07/2020 and 08/13/2018. FINDINGS: Heart size and mediastinal contours are stable. Lungs are clear. No pleural effusion is seen. Osseous structures about the chest are unremarkable. IMPRESSION: No active disease. No evidence of pneumonia or pulmonary edema. Electronically Signed   By: Franki Cabot M.D.   On: 03/04/2020 14:54    Orson Eva, DO  [facility]  If 7PM-7AM, please contact night-coverage [email or web address] Password [email or web address] 03/18/2020, 5:31 PM   LOS: 3 days

## 2020-03-18 NOTE — Progress Notes (Signed)
Daily Progress Note   Patient Name: Clifford [name]       Date: 03/18/2020 DOB: February 14, 1951  Age: 69 y.o. MRN#: [id number] Attending Physician: Orson Eva, MD Primary Care Physician: [name], FNP Admit Date: 03/15/2020  Reason for Consultation/Follow-up: Establishing goals of care  Subjective: Patient awake, alert, oriented and in good spirits this afternoon. Denies pain or discomfort. Tolerating clears.  GOC:  Wife at bedside. Reviewed events leading up to admission and course of hospitalization in detail including diagnoses, interventions, plan of care, recommendations from specialists. Answered questions regarding care plan.   Discussed importance of sobriety moving forward and options for AA/telehealth visits. Wife shares that her and family have encouraged him to quit drinking in the past and that this is 'up to him' to follow through with sobriety. Patient shares his hope to remain sober and that he knows what to do. Wife and patient understand he will continue to have complications if ongoing ETOH use.  Explored if wife had questions regarding AD packet left at bedside. She denies questions. She mentions that she knows Ambers's wishes if critically ill and unable to make decisions for himself but she does not go in to detail regarding his wishes. At this point, patient desires life-saving measures if necessary to prolong life.   Therapeutic listening. Emotional/spiritual support provided. Answered questions to the best of my ability.   Length of Stay: 3  Current Medications: Scheduled Meds:  . folic acid  1 mg Oral Daily  . magnesium oxide  400 mg Oral Daily  . multivitamin with minerals  1 tablet Oral Daily  . pantoprazole  40 mg Oral BID AC  . sodium chloride flush  3 mL  Intravenous Once  . thiamine  100 mg Oral Daily    Continuous Infusions: . cefTRIAXone (ROCEPHIN)  IV 2 g (03/17/20 1648)  . heparin 1,100 Units/hr (03/18/20 0140)  . lactated ringers 75 mL/hr at 03/18/20 1235    PRN Meds: acetaminophen **OR** acetaminophen, HYDROmorphone (DILAUDID) injection, LORazepam **OR** LORazepam, polyethylene glycol, traZODone  Physical Exam Vitals and nursing note reviewed.  Constitutional:      General: He is awake.  HENT:     Head: Normocephalic and atraumatic.  Pulmonary:     Effort: No tachypnea, accessory muscle usage or respiratory distress.  Abdominal:  Tenderness: There is no abdominal tenderness.  Neurological:     Mental Status: He is alert and oriented to person, place, and time.            Vital Signs: BP (!) 149/85 (BP Location: Right Arm)   Pulse 98   Temp (!) 100.5 F (38.1 C) (Oral)   Resp 18   Ht 5\' 6"  (1.676 m)   Wt 61.2 kg   SpO2 97%   BMI 21.79 kg/m  SpO2: SpO2: 97 % O2 Device: O2 Device: Room Air O2 Flow Rate:    Intake/output summary:   Intake/Output Summary (Last 24 hours) at 03/18/2020 1627 Last data filed at 03/18/2020 1321 Gross per 24 hour  Intake 240 ml  Output 300 ml  Net -60 ml   LBM: Last BM Date: 03/18/20 Baseline Weight: Weight: 61.2 kg Most recent weight: Weight: 61.2 kg       Palliative Assessment/Data: PPS 40%      Patient Active Problem List   Diagnosis Date Noted  . Klebsiella sepsis ([place]) 03/17/2020  . Palliative care by specialist   . Goals of care, counseling/discussion   . Pancreatitis, recurrent 03/15/2020  . Acute metabolic encephalopathy [id number]  . Hyperbilirubinemia 03/15/2020  . 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 Pancreatitis 03/15/2020  . Symptomatic anemia 03/02/2020  . UGI bleed 03/02/2020  . Hypomagnesemia 12/09/2019  . Normocytic anemia 12/08/2019  . Positive fecal occult blood test 12/08/2019  . Abnormal LFTs (liver function tests) 12/08/2019  . Alcoholic  ketosis [id number]  . Hyponatremia   . Weakness 04/18/2019  . Metabolic acidosis [id number]  . AKI (acute kidney injury) ([place]) 04/18/2019  . Sepsis ([place]) 07/05/2018  . Bleeding hemorrhoid   . Thrombocytopenia ([place]) 04/08/2018  . Malignant neoplasm of prostate ([place]) 12/04/2016  . Anemia in other chronic diseases classified elsewhere 07/11/2016  . Alcoholism ([place]) 07/04/2016  . Seizures ([place]) 07/04/2016  . Hyperlipidemia 07/04/2016  . Essential hypertension 07/04/2016    Palliative Care Assessment & Plan   Patient Profile: 69 y.o. male  with past medical history of ETOH abuse, HTN, prostate cancer, prior GI bleeding, thrombocytopenia, recent admit with EGD 03/03/20 no varices admitted on 03/15/2020 with diarrhea and weakness. Patient found to have pancreatitis vs. Pancreatic mass vs. Pseudocyst on imaging with known ETOH use. Will need MRI once AKI resolved. Imaging also revealed portal vein thrombosis, superior mesenteric vein thrombosis, and ? splenic vein thrombosis, heparin gtt started. CT head negative for acute findings. Sepsis secondary to consolidative changes on CT, started on antibiotics for possible pneumonia. Thrombocytopenia likely secondary to ETOH use, liver disease, and splenic clot burden. ETOH use patient reports last drink four days prior to admit. No signs of withdrawal. GI and hematology following. Palliative medicine consultation for goals of care.   Assessment: Sepsis Klebsiella bacteremia Lobar pneumonia Pancreatitis Portal vein and SMV thrombosis Acute on chronic renal failure, CKD stage IIIa Thrombocytopenia Pancreatic tail hypodensity/mass ETOH use  Recommendations/Plan:  Continue full code/full scope treatment. Patient desires life-saving measures if necessary.   Ongoing GI/hemotology support  AD packet left with patient and wife. They are not ready to complete.   Encouraged and discussed importance of sobriety. Patient hopeful to maintain sobriety  moving forward.   May benefit from outpatient palliative referral.   Code Status: FULL   Code Status Orders  (From admission, onward)         Start     Ordered   03/15/20 2253  Full code  Continuous  03/15/20 2252        Code Status History    Date Active Date Inactive Code Status Order ID Comments User Context   03/02/2020 2101 03/05/2020 1701 Full Code [id number]  Bethena Roys, MD Inpatient   12/08/2019 2000 12/09/2019 1947 Full Code [id number]  Reubin Milan, MD ED   04/18/2019 2329 04/20/2019 1714 Full Code [id number]  Truett Mainland, DO Inpatient   07/05/2018 1844 07/12/2018 1646 Full Code [id number]  Erline Hau, MD Inpatient   06/06/2018 2253 06/09/2018 1604 Full Code [id number]  Oswald Hillock, MD Inpatient   04/05/2018 2051 04/09/2018 1654 Full Code [id number]  Truett Mainland, DO Inpatient   Advance Care Planning Activity       Prognosis:   Unable to determine  Discharge Planning:  To Be Determined  Care plan was discussed with RN, patient, wife  Thank you for allowing the Palliative Medicine Team to assist in the care of this patient.   Time In: 1425 Time Out: 1455 Total Time 30 Prolonged Time Billed no      Greater than 50%  of this time was spent counseling and coordinating care related to the above assessment and plan.  Ihor Dow, DNP, FNP-C Palliative Medicine Team  Phone: [phone] Fax: [phone]  Please contact Palliative Medicine Team phone at [phone] for questions and concerns.

## 2020-03-19 ENCOUNTER — Inpatient Hospital Stay (HOSPITAL_COMMUNITY): Payer: Medicare Other

## 2020-03-19 DIAGNOSIS — I340 Nonrheumatic mitral (valve) insufficiency: Secondary | ICD-10-CM

## 2020-03-19 DIAGNOSIS — I361 Nonrheumatic tricuspid (valve) insufficiency: Secondary | ICD-10-CM

## 2020-03-19 LAB — GASTROINTESTINAL PANEL BY PCR, STOOL (REPLACES STOOL CULTURE)

## 2020-03-19 LAB — HEPATIC FUNCTION PANEL
ALT: 15 U/L (ref 0–44)
AST: 38 U/L (ref 15–41)
Albumin: 1.9 g/dL — ABNORMAL LOW (ref 3.5–5.0)
Alkaline Phosphatase: 68 U/L (ref 38–126)
Bilirubin, Direct: 0.5 mg/dL — ABNORMAL HIGH (ref 0.0–0.2)
Indirect Bilirubin: 0.6 mg/dL (ref 0.3–0.9)
Total Bilirubin: 1.1 mg/dL (ref 0.3–1.2)
Total Protein: 5.1 g/dL — ABNORMAL LOW (ref 6.5–8.1)

## 2020-03-19 LAB — CBC
HCT: 23.8 % — ABNORMAL LOW (ref 39.0–52.0)
Hemoglobin: 7.5 g/dL — ABNORMAL LOW (ref 13.0–17.0)
MCH: 29 pg (ref 26.0–34.0)
MCHC: 31.5 g/dL (ref 30.0–36.0)
MCV: 91.9 fL (ref 80.0–100.0)
Platelets: 103 10*3/uL — ABNORMAL LOW (ref 150–400)
RBC: 2.59 MIL/uL — ABNORMAL LOW (ref 4.22–5.81)
RDW: 15.7 % — ABNORMAL HIGH (ref 11.5–15.5)
WBC: 10.4 10*3/uL (ref 4.0–10.5)
nRBC: 0.3 % — ABNORMAL HIGH (ref 0.0–0.2)

## 2020-03-19 LAB — LIPID PANEL
Cholesterol: 89 mg/dL (ref 0–200)
HDL: <10 mg/dL — ABNORMAL LOW (ref >40)
Triglycerides: 117 mg/dL (ref <150)
VLDL: 23 mg/dL (ref 0–40)

## 2020-03-19 LAB — BASIC METABOLIC PANEL
Anion gap: 11 (ref 5–15)
BUN: 19 mg/dL (ref 8–23)
CO2: 20 mmol/L — ABNORMAL LOW (ref 22–32)
Calcium: 7.3 mg/dL — ABNORMAL LOW (ref 8.9–10.3)
Chloride: 104 mmol/L (ref 98–111)
Creatinine, Ser: 1.28 mg/dL — ABNORMAL HIGH (ref 0.61–1.24)
GFR calc Af Amer: >60 mL/min (ref >60)
GFR calc non Af Amer: 57 mL/min — ABNORMAL LOW (ref >60)
Glucose, Bld: 116 mg/dL — ABNORMAL HIGH (ref 70–99)
Potassium: 3.4 mmol/L — ABNORMAL LOW (ref 3.5–5.1)
Sodium: 135 mmol/L (ref 135–145)

## 2020-03-19 LAB — HEPARIN LEVEL (UNFRACTIONATED)
Heparin Unfractionated: 0.21 IU/mL — ABNORMAL LOW (ref 0.30–0.70)
Heparin Unfractionated: 0.27 IU/mL — ABNORMAL LOW (ref 0.30–0.70)

## 2020-03-19 LAB — ECHOCARDIOGRAM COMPLETE
Height: 66 in
Weight: 2160 oz

## 2020-03-19 LAB — COPPER, SERUM: Copper: 131 ug/dL (ref 69–132)

## 2020-03-19 LAB — LIPASE, BLOOD: Lipase: 927 U/L — ABNORMAL HIGH (ref 11–51)

## 2020-03-19 MED ORDER — PANCRELIPASE (LIP-PROT-AMYL) 12000-38000 UNITS PO CPEP
48000.0000 [IU] | ORAL_CAPSULE | Freq: Three times a day (TID) | ORAL | Status: DC
  Administered 2020-03-19 – 2020-03-20 (×3): 48000 [IU] via ORAL
  Filled 2020-03-19 (×4): qty 4

## 2020-03-19 MED ORDER — SODIUM CHLORIDE 0.9 % IV SOLN
2.0000 g | INTRAVENOUS | Status: DC
  Administered 2020-03-20 – 2020-03-22 (×3): 2 g via INTRAVENOUS
  Filled 2020-03-19 (×5): qty 20

## 2020-03-19 NOTE — Progress Notes (Addendum)
ANTICOAGULATION CONSULT NOTE  Pharmacy Consult for Heparin Indication: portal vein thrombosis  Allergies  Allergen Reactions  . Lisinopril Swelling    Patient Measurements: Height: 5\' 6"  (167.6 cm) Weight: 61.2 kg (135 lb) IBW/kg (Calculated) : 63.8 Heparin Dosing Weight: 61.2 kg  Vital Signs: Temp: 99 F (37.2 C) (05/07 1300) Temp Source: Oral (05/07 1300) BP: 136/77 (05/07 1300) Pulse Rate: 80 (05/07 1300)  Labs: Recent Labs    03/17/20 0131 03/17/20 0807 03/18/20 0444 03/18/20 0807 03/19/20 0508 03/19/20 0845 03/19/20 1817  HGB 8.3*  --  7.4*  --  7.5*  --   --   HCT 26.6*  --  23.1*  --  23.8*  --   --   PLT 36*  --  54*  --  103*  --   --   HEPARINUNFRC 0.39   < >  --  0.34  --  0.21* 0.27*  CREATININE 1.76*  --  1.45*  --  1.28*  --   --    < > = values in this interval not displayed.    Estimated Creatinine Clearance: 47.1 mL/min (A) (by C-G formula based on SCr of 1.28 mg/dL (H)).  Assessment: 69 yr old man with portal vein thrombosis; pharmacy was consulted to start heparin therapy.  Pt has hx of GI bleed; Hematology/Oncology okay with starting heparin, as head CT is negative for bleed. Pt was no on anticoagulation PTA.    Heparin level ~7 hrs after heparin infusion was increased to 1250 units/hr was 0.27 units/ml, which is below the goal range for this pt. H/H 7.5/23.8 (stable), platelets 103 (improved). Per RN, no issues with IV or bleeding observed.  Goal of Therapy:  Heparin level 0.3-0.7 units/ml (target lower end of goal) Monitor platelets by anticoagulation protocol: Yes   Plan:  Increase heparin infusion rate to 1350 units/hr  Check heparin level in ~7 hours Monitor daily HL and CBC Monitor for bleeding complications Follow-up transition to oral anticoagulation  Gillermina Hu, PharmD, BCPS, [facility] Clinical Pharmacist 03/19/2020 7:11 PM

## 2020-03-19 NOTE — Progress Notes (Signed)
Subjective:  Patient states he is glad that the rectal tube has been removed.  He states his stool is not loose.  It is now mushy.  He thinks he has had 2 bowel movements today. He denies chest pain or shortness of breath.  He has not experienced nausea or vomiting with full liquids.  He still has left upper quadrant pain when he takes a deep breath.  He says pain is not as pronounced as it was yesterday.  Objective: Blood pressure 136/77, pulse 80, temperature 99 F (37.2 C), temperature source Oral, resp. rate 19, height '5\' 6"'$  (1.676 m), weight 61.2 kg, SpO2 95 %. Patient is alert and in no acute distress.  Cardiac exam with regular rhythm normal S1 and S2.  No murmur gallop noted. Auscultation lungs reveal vesicular breath sounds bilaterally.   Abdomen is full.  Bowel sounds are normal.  On palpation abdomen is soft.  He has mild tenderness below the left costal margin on deep palpation.  Liver edge is palpable and is firm but not tender. No peripheral edema noted.  Labs/studies Results:   CBC Latest Ref Rng & Units 03/19/2020 03/18/2020 03/17/2020  WBC 4.0 - 10.5 K/uL 10.4 9.1 10.2  Hemoglobin 13.0 - 17.0 g/dL 7.5(L) 7.4(L) 8.3(L)  Hematocrit 39.0 - 52.0 % 23.8(L) 23.1(L) 26.6(L)  Platelets 150 - 400 K/uL 103(L) 54(L) 36(L)    CMP Latest Ref Rng & Units 03/19/2020 03/18/2020 03/17/2020  Glucose 70 - 99 mg/dL 116(H) 111(H) 107(H)  BUN 8 - 23 mg/dL 19 27(H) 31(H)  Creatinine 0.61 - 1.24 mg/dL 1.28(H) 1.45(H) 1.76(H)  Sodium 135 - 145 mmol/L 135 135 133(L)  Potassium 3.5 - 5.1 mmol/L 3.4(L) 3.1(L) 3.4(L)  Chloride 98 - 111 mmol/L 104 103 104  CO2 22 - 32 mmol/L 20(L) 19(L) 16(L)  Calcium 8.9 - 10.3 mg/dL 7.3(L) 7.5(L) 7.3(L)  Total Protein 6.5 - 8.1 g/dL 5.1(L) 5.3(L) -  Total Bilirubin 0.3 - 1.2 mg/dL 1.1 1.6(H) -  Alkaline Phos 38 - 126 U/L 68 70 -  AST 15 - 41 U/L 38 48(H) -  ALT 0 - 44 U/L 15 16 -    Hepatic Function Latest Ref Rng & Units 03/19/2020 03/18/2020 03/16/2020  Total Protein  6.5 - 8.1 g/dL 5.1(L) 5.3(L) 6.3(L)  Albumin 3.5 - 5.0 g/dL 1.9(L) 2.0(L) 2.7(L)  AST 15 - 41 U/L 38 48(H) 58(H)  ALT 0 - 44 U/L '15 16 23  '$ Alk Phosphatase 38 - 126 U/L 68 70 70  Total Bilirubin 0.3 - 1.2 mg/dL 1.1 1.6(H) 2.5(H)  Bilirubin, Direct 0.0 - 0.2 mg/dL 0.5(H) - -     Serum amylase 520.  It was 519 yesterday. Serum lipase 563.  It was 1236 yesterday  C. difficile testing negative for antigen and toxin.  GI pathogen panel is pending.   Assessment:  #1.  Portal/splenic and SMV thrombosis.  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  He possibly has experienced pancreatitis in the past without a definite diagnosis.  Day 4 on heparin infusion.  He will be transitioned to oral agent prior to discharge.  #2.  Pancreatitis.  Suspect acute on chronic pancreatitis.  Patient is tolerating full liquids.  Serum lipase remains elevated.  Abdominal examination does not reveal increasing tenderness or distention.  #3.  History of peptic ulcer disease.  Peptic ulcer disease was diagnosed about 2 1/2 weeks ago when he presented with melena and anemia.  H. pylori serology was negative.  Patient is on double dose PPI.   #  4.  Klebsiella sepsis.  Blood cultures positive from 3 days ago.  Source would appear to be pneumonia given that he has lower lobe consolidation.    #5.  Diarrhea.  GI pathogen panel is still pending.  Diarrhea has slowed down with loperamide.  #6.  Anemia.  H&H has dropped significantly since admission.  Some of the drop has to be due to rehydration.  Hemoglobin has stabilized around 7-1/2 over the last 24 hours.  He will need blood transfusion if hemoglobin drops below 7 g or if he experiences overt GI bleed.  #7.  Acute on chronic kidney disease.  Renal function continuing to improve.  #8.  Thrombocytopenia.  Significant improvement in platelet count of the last 24 hours.  This is most likely due to return of marrow function.  #9.  Alcoholic liver disease.  Based on imaging  studies and blood work he has alcoholic hepatitis.  He is on multivitamin, folic acid and thiamine.   Recommendations:  Advance diet to heart healthy diet. Begin pancreatic enzyme supplement. CBC metabolic 7 and serum lipase in a.m. Follow-up abdominal pelvic CT prior to discharge.  Please note patient's condition discussed with Mrs. Jeralyn Ruths who is at bedside.

## 2020-03-19 NOTE — Progress Notes (Signed)
2 D echo completed 

## 2020-03-19 NOTE — Progress Notes (Signed)
PROGRESS NOTE  Clifford [name] [id number] DOB: 02/26/1951 DOA: 03/15/2020 PCP: [name], FNP  Brief History: 69 year old male with a history of prostate cancer, alcohol abuse, thrombocytopenia, hypertension, hyperlipidemia, GI bleed presenting with generalized weakness and diarrhea. He denied any fevers, chills but has some dyspnea on exertion. There is no nausea or vomiting. There is no hematochezia or melena. The patient normally drinks 1 pint of gin on a daily basis with the last drink on 03/11/2020. His wife had stated that the patient had been confused over the last several days prior to admission. He has had poor oral intake. CT of the abdomen and pelvis showed thrombus in the SMV and portal vein as well as splenic vein. Hematology was consulted. The patient was started on IV heparin. There was subtle peripancreatic edema with dilatation of the pancreatic duct in the tail with a hypoenhancing lesion. There was concern for neoplasm versus pseudocyst versus pancreatitis. There was bilateral lower lobe consolidation.  Assessment/Plan: Sepsis -Present at the time of admission -Secondary to bacteremia and pneumonia -Lactic acid peaked 1.8 -PCT 4.78 -lactic 1.8 -Continue ceftriaxone 2 grams daily  Klebsiella bacteremia -Continue ceftriaxone 2 g daily pending final culture data -Source is  Pneumonia -recurrent fever likely due to pancreatitis -5/6 repeat blood culture--neg  Lobar pneumonia -Continue ceftriaxone  -Stable on room air  Portal vein and SMV thrombosis -Continue IV heparin -Appreciate hematology consult -Monitor platelet counts -Splenic vein may also be occluded -obtain echo--EF 55-60%, no WMA, mild MR/TR, no thrombi  Diarrhea -C. difficile negative -Stool pathogen panel--neg -Suspect this is likely due to pancreatic insufficiency -Creon started by GI  Acute on chronic renal failure--CKD stage IIIa -Secondary to sepsis and  volume depletion -Baseline creatinine 1.0-1.3 -Presented with serum creatinine 2.44 -Continue IV fluids  Acute on chronic pancreatitis -tolerating clears>>full liquid -diet advancement per GI -check lipid panel--triglycerides 117 -lipase peaked 1236 -continue IVF -Creon added by GI  Thrombocytopenia -Multifactorial including myelosuppression from his alcohol with acute worsening secondary to sepsis and acute clot -improving with tx of infection -HIT panel neg  Pancreatic tail hypodensity/mass -Appreciate GI consult--suspect the changes are likely due to chronic pancreatitis with small pseudocysts although cystic pancreatic neoplasm remains in the differential diagnosis  Alcohol abuse -Alcohol withdrawal protocol -no signs of withdrawal  GOC  -full code, full scope of care    Disposition Plan: Patient From: Home D/C Place: Home- 2-3 Days Barriers: Not Clinically Stable--not tolerating diet, persistent fever on IV abx; rising lipase  Family Communication:Spouse updatedat bedside 5/5  Consultants:GI, hematology, palliative  Code Status: FULL   DVT Prophylaxis:IVHeparin    Procedures: As Listed in Progress Note Above  Antibiotics: Ceftriaxone 5/3>>>     Subjective: Pt states abd pain is improving.  No n/v.  Having some loose stools, but slowing down.  Denies cp, sob, hematochezia, melena, dysuria  Objective: Vitals:   03/18/20 2018 03/18/20 2114 03/19/20 0540 03/19/20 1300  BP:  139/79 (!) 141/81 136/77  Pulse:  88 85 80  Resp:  20 20 19   Temp:  100.2 F (37.9 C) 99.2 F (37.3 C) 99 F (37.2 C)  TempSrc:   Oral Oral  SpO2: 93% 96% 97% 95%  Weight:      Height:        Intake/Output Summary (Last 24 hours) at 03/19/2020 1700 Last data filed at 03/19/2020 1100 Gross per 24 hour  Intake 5745.04 ml  Output 200 ml  Net  5545.04 ml   Weight change:  Exam:   General:  Pt is alert, follows commands appropriately, not in  acute distress  HEENT: No icterus, No thrush, No neck mass, Leon/AT  Cardiovascular: RRR, S1/S2, no rubs, no gallops  Respiratory: Bibasilar crackles but no wheezing.  Good air movement  Abdomen: Soft/+BS, non tender, non distended, no guarding  Extremities: Trace lower extremity edema, No lymphangitis, No petechiae, No rashes, no synovitis   Data Reviewed: I have personally reviewed following labs and imaging studies Basic Metabolic Panel: Recent Labs  Lab 03/15/20 1222 03/16/20 0618 03/17/20 0131 03/18/20 0444 03/19/20 0508  NA 132* 136 133* 135 135  K 4.1 4.0 3.4* 3.1* 3.4*  CL 97* 102 104 103 104  CO2 20* 19* 16* 19* 20*  GLUCOSE 137* 105* 107* 111* 116*  BUN 35* 34* 31* 27* 19  CREATININE 2.44* 1.92* 1.76* 1.45* 1.28*  CALCIUM 8.2* 7.8* 7.3* 7.5* 7.3*   Liver Function Tests: Recent Labs  Lab 03/15/20 1222 03/16/20 0618 03/18/20 0444 03/19/20 0508  AST 62* 58* 48* 38  ALT 26 23 16 15   ALKPHOS 71 70 70 68  BILITOT 2.9* 2.5* 1.6* 1.1  PROT 6.7 6.3* 5.3* 5.1*  ALBUMIN 3.0* 2.7* 2.0* 1.9*   Recent Labs  Lab 03/15/20 1222 03/16/20 0618 03/17/20 0131 03/17/20 0807 03/18/20 0444 03/19/20 0508  LIPASE 655* 820* 1,236*  --  563* 927*  AMYLASE  --   --   --  519* 520*  --    No results for input(s): AMMONIA in the last 168 hours. Coagulation Profile: Recent Labs  Lab 03/15/20 2223 03/16/20 0618  INR 1.3* 1.2   CBC: Recent Labs  Lab 03/15/20 2223 03/16/20 0618 03/17/20 0131 03/18/20 0444 03/19/20 0508  WBC 8.7 9.2 10.2 9.1 10.4  HGB 8.7* 9.5* 8.3* 7.4* 7.5*  HCT 27.4* 31.1* 26.6* 23.1* 23.8*  MCV 94.2 97.2 94.3 92.8 91.9  PLT 37* 46* 36* 54* 103*   Cardiac Enzymes: No results for input(s): CKTOTAL, CKMB, CKMBINDEX, TROPONINI in the last 168 hours. BNP: Invalid input(s): POCBNP CBG: Recent Labs  Lab 03/16/20 0740  GLUCAP 105*   HbA1C: No results for input(s): HGBA1C in the last 72 hours. Urine analysis:    Component Value Date/Time    COLORURINE YELLOW 03/04/2020 1609   APPEARANCEUR CLEAR 03/04/2020 1609   LABSPEC 1.015 03/04/2020 1609   PHURINE 5.0 03/04/2020 1609   GLUCOSEU NEGATIVE 03/04/2020 1609   HGBUR SMALL (A) 03/04/2020 1609   BILIRUBINUR NEGATIVE 03/04/2020 1609   KETONESUR NEGATIVE 03/04/2020 1609   PROTEINUR 30 (A) 03/04/2020 1609   NITRITE NEGATIVE 03/04/2020 1609   LEUKOCYTESUR NEGATIVE 03/04/2020 1609   Sepsis Labs: @LABRCNTIP (procalcitonin:4,lacticidven:4) ) Recent Results (from the past 240 hour(s))  Respiratory Panel by RT PCR (Flu A&B, Covid) - Nasopharyngeal Swab     Status: None   Collection Time: 03/15/20  3:52 PM   Specimen: Nasopharyngeal Swab  Result Value Ref Range Status   SARS Coronavirus 2 by RT PCR NEGATIVE NEGATIVE Final    Comment: (NOTE) SARS-CoV-2 target nucleic acids are NOT DETECTED. The SARS-CoV-2 RNA is generally detectable in upper respiratoy specimens during the acute phase of infection. The lowest concentration of SARS-CoV-2 viral copies this assay can detect is 131 copies/mL. A negative result does not preclude SARS-Cov-2 infection and should not be used as the sole basis for treatment or other patient management decisions. A negative result may occur with  improper specimen collection/handling, submission of specimen other than nasopharyngeal  swab, presence of viral mutation(s) within the areas targeted by this assay, and inadequate number of viral copies (<131 copies/mL). A negative result must be combined with clinical observations, patient history, and epidemiological information. The expected result is Negative. Fact Sheet for Patients:  PinkCheek.be Fact Sheet for Healthcare Providers:  GravelBags.it This test is not yet ap proved or cleared by the [place] FDA and  has been authorized for detection and/or diagnosis of SARS-CoV-2 by FDA under an Emergency Use Authorization (EUA). This EUA will  remain  in effect (meaning this test can be used) for the duration of the COVID-19 declaration under Section 564(b)(1) of the Act, 21 U.S.C. section 360bbb-3(b)(1), unless the authorization is terminated or revoked sooner.    Influenza A by PCR NEGATIVE NEGATIVE Final   Influenza B by PCR NEGATIVE NEGATIVE Final    Comment: (NOTE) The Xpert Xpress SARS-CoV-2/FLU/RSV assay is intended as an aid in  the diagnosis of influenza from Nasopharyngeal swab specimens and  should not be used as a sole basis for treatment. Nasal washings and  aspirates are unacceptable for Xpert Xpress SARS-CoV-2/FLU/RSV  testing. Fact Sheet for Patients: PinkCheek.be Fact Sheet for Healthcare Providers: GravelBags.it This test is not yet approved or cleared by the [place] FDA and  has been authorized for detection and/or diagnosis of SARS-CoV-2 by  FDA under an Emergency Use Authorization (EUA). This EUA will remain  in effect (meaning this test can be used) for the duration of the  Covid-19 declaration under Section 564(b)(1) of the Act, 21  U.S.C. section 360bbb-3(b)(1), unless the authorization is  terminated or revoked. Performed at [facility], [address], [place], [place] [address]   Blood culture (routine x 2)     Status: Abnormal   Collection Time: 03/15/20  3:52 PM   Specimen: BLOOD RIGHT WRIST  Result Value Ref Range Status   Specimen Description   Final    BLOOD RIGHT WRIST Performed at [facility], [address], [place], [place] [address]    Special Requests   Final    BOTTLES DRAWN AEROBIC AND ANAEROBIC Blood Culture adequate volume Performed at [facility], [address], [place],  [address]    Culture  Setup Time   Final    IN BOTH AEROBIC AND ANAEROBIC BOTTLES GRAM NEGATIVE RODS Gram Stain Report Called to,Read Back By and Verified With: T VILLALOBOS,RN @0433  03/16/20 MKELLY    Culture KLEBSIELLA PNEUMONIAE  (A)  Final   Report Status 03/18/2020 FINAL  Final   Organism ID, Bacteria KLEBSIELLA PNEUMONIAE  Final      Susceptibility   Klebsiella pneumoniae - MIC*    AMPICILLIN >=32 RESISTANT Resistant     CEFAZOLIN 16 SENSITIVE Sensitive     CEFEPIME <=1 SENSITIVE Sensitive     CEFTAZIDIME <=1 SENSITIVE Sensitive     CEFTRIAXONE <=1 SENSITIVE Sensitive     CIPROFLOXACIN <=0.25 SENSITIVE Sensitive     GENTAMICIN <=1 SENSITIVE Sensitive     IMIPENEM <=0.25 SENSITIVE Sensitive     TRIMETH/SULFA <=20 SENSITIVE Sensitive     AMPICILLIN/SULBACTAM >=32 RESISTANT Resistant     PIP/TAZO 32 INTERMEDIATE Intermediate     * KLEBSIELLA PNEUMONIAE  Blood Culture ID Panel (Reflexed)     Status: Abnormal   Collection Time: 03/15/20  3:52 PM  Result Value Ref Range Status   Enterococcus species NOT DETECTED NOT DETECTED Final   Listeria monocytogenes NOT DETECTED NOT DETECTED Final   Staphylococcus species NOT DETECTED NOT DETECTED Final  Staphylococcus aureus (BCID) NOT DETECTED NOT DETECTED Final   Streptococcus species NOT DETECTED NOT DETECTED Final   Streptococcus agalactiae NOT DETECTED NOT DETECTED Final   Streptococcus pneumoniae NOT DETECTED NOT DETECTED Final   Streptococcus pyogenes NOT DETECTED NOT DETECTED Final   Acinetobacter baumannii NOT DETECTED NOT DETECTED Final   Enterobacteriaceae species DETECTED (A) NOT DETECTED Final    Comment: Enterobacteriaceae represent a large family of gram-negative bacteria, not a single organism. CRITICAL RESULT CALLED TO, READ BACK BY AND VERIFIED WITH: Deborra Medina PharmD 9:10 03/16/20 (wilsonm)    Enterobacter cloacae complex NOT DETECTED NOT DETECTED Final   Escherichia coli NOT DETECTED NOT DETECTED Final   Klebsiella oxytoca NOT DETECTED NOT DETECTED Final   Klebsiella pneumoniae DETECTED (A) NOT DETECTED Final    Comment: CRITICAL RESULT CALLED TO, READ BACK BY AND VERIFIED WITH: Deborra Medina PharmD 9:10 03/16/20 ([email or web address])    Proteus species NOT DETECTED  NOT DETECTED Final   Serratia marcescens NOT DETECTED NOT DETECTED Final   Carbapenem resistance NOT DETECTED NOT DETECTED Final   Haemophilus influenzae NOT DETECTED NOT DETECTED Final   Neisseria meningitidis NOT DETECTED NOT DETECTED Final   Pseudomonas aeruginosa NOT DETECTED NOT DETECTED Final   Candida albicans NOT DETECTED NOT DETECTED Final   Candida glabrata NOT DETECTED NOT DETECTED Final   Candida krusei NOT DETECTED NOT DETECTED Final   Candida parapsilosis NOT DETECTED NOT DETECTED Final   Candida tropicalis NOT DETECTED NOT DETECTED Final    Comment: Performed at [facility], [place] [address], [place], [place] [address]  Blood culture (routine x 2)     Status: Abnormal   Collection Time: 03/15/20  3:53 PM   Specimen: BLOOD RIGHT HAND  Result Value Ref Range Status   Specimen Description   Final    BLOOD RIGHT HAND BOTTLES DRAWN AEROBIC AND ANAEROBIC Performed at [facility] [address] [place], [place] [address]    Special Requests   Final    Blood Culture adequate volume Performed at [facility], [address] [place], [place] [address]    Culture  Setup Time   Final    IN BOTH AEROBIC AND ANAEROBIC BOTTLES GRAM NEGATIVE RODS Gram Stain Report Called to,Read Back By and Verified With: T VILLALOBOS,RN @0432  03/16/20 [facility] Performed at [facility], [address], [place], [place] [address]    Culture (A)  Final    KLEBSIELLA PNEUMONIAE SUSCEPTIBILITIES PERFORMED ON PREVIOUS CULTURE WITHIN THE LAST 5 DAYS. Performed at [facility], [place] [address], [place], [place] [address]    Report Status 03/18/2020 FINAL  Final  C Difficile Quick Screen w PCR reflex     Status: None   Collection Time: 03/16/20  2:37 PM   Specimen: STOOL  Result Value Ref Range Status   C Diff antigen NEGATIVE NEGATIVE Final   C Diff toxin NEGATIVE NEGATIVE Final   C Diff interpretation No C. difficile detected.  Final    Comment: Performed at [facility], [address] [place], [place] [address]  Gastrointestinal Panel by PCR , Stool     Status: None   Collection Time: 03/17/20  5:19 PM   Specimen: Stool  Result Value Ref Range Status   Campylobacter species NOT DETECTED NOT DETECTED Final   Plesimonas shigelloides NOT DETECTED NOT DETECTED Final   Salmonella species NOT DETECTED NOT DETECTED Final   Yersinia enterocolitica NOT DETECTED NOT DETECTED Final   Vibrio species NOT DETECTED NOT DETECTED Final  Vibrio cholerae NOT DETECTED NOT DETECTED Final   Enteroaggregative E coli (EAEC) NOT DETECTED NOT DETECTED Final   Enteropathogenic E coli (EPEC) NOT DETECTED NOT DETECTED Final   Enterotoxigenic E coli (ETEC) NOT DETECTED NOT DETECTED Final   Shiga like toxin producing E coli (STEC) NOT DETECTED NOT DETECTED Final   Shigella/Enteroinvasive E coli (EIEC) NOT DETECTED NOT DETECTED Final   Cryptosporidium NOT DETECTED NOT DETECTED Final   Cyclospora cayetanensis NOT DETECTED NOT DETECTED Final   Entamoeba histolytica NOT DETECTED NOT DETECTED Final   Giardia lamblia NOT DETECTED NOT DETECTED Final   Adenovirus F40/41 NOT DETECTED NOT DETECTED Final   Astrovirus NOT DETECTED NOT DETECTED Final   Norovirus GI/GII NOT DETECTED NOT DETECTED Final   Rotavirus A NOT DETECTED NOT DETECTED Final   Sapovirus (I, II, IV, and V) NOT DETECTED NOT DETECTED Final    Comment: Performed at [facility], [address] [place], [place] [address]  Culture, blood (Routine X 2) w Reflex to ID Panel     Status: None (Preliminary result)   Collection Time: 03/18/20  7:26 AM   Specimen: BLOOD RIGHT HAND  Result Value Ref Range Status   Specimen Description BLOOD RIGHT HAND  Final   Special Requests   Final    BOTTLES DRAWN AEROBIC AND ANAEROBIC Blood Culture results may not be optimal due to an inadequate volume of blood received in culture bottles   Culture   Final    NO GROWTH < 24 HOURS Performed at [facility], [address] [place], [place] [address]     Report Status PENDING  Incomplete  Culture, blood (Routine X 2) w Reflex to ID Panel     Status: None (Preliminary result)   Collection Time: 03/18/20  7:26 AM   Specimen: BLOOD LEFT HAND  Result Value Ref Range Status   Specimen Description BLOOD LEFT HAND  Final   Special Requests   Final    BOTTLES DRAWN AEROBIC AND ANAEROBIC Blood Culture adequate volume   Culture   Final    NO GROWTH < 24 HOURS Performed at [facility], [address] [place], [place] [address]    Report Status PENDING  Incomplete     Scheduled Meds: . folic acid  1 mg Oral Daily  . lipase/protease/amylase  48,000 Units Oral TID WC  . loperamide  2 mg Oral BID AC  . magnesium oxide  400 mg Oral Daily  . multivitamin with minerals  1 tablet Oral Daily  . pantoprazole  40 mg Oral BID AC  . sodium chloride flush  3 mL Intravenous Once  . thiamine  100 mg Oral Daily   Continuous Infusions: . cefTRIAXone (ROCEPHIN)  IV Stopped (03/18/20 1724)  . heparin 1,250 Units/hr (03/19/20 1127)  . lactated ringers 75 mL/hr at 03/19/20 0600    Procedures/Studies: DG Chest 2 View  Result Date: 03/15/2020 CLINICAL DATA:  [place] EXAM: CHEST - 2 VIEW COMPARISON:  03/04/2020 FINDINGS: Heart is normal size. Left lower lobe atelectasis or infiltrate noted. No confluent opacity on the right. No effusions or acute bony abnormality. IMPRESSION: Left basilar atelectasis or infiltrate. Electronically Signed   By: Rolm Baptise M.D.   On: 03/15/2020 21:40   CT HEAD WO CONTRAST  Result Date: 03/15/2020 CLINICAL DATA:  Encephalopathy EXAM: CT HEAD WITHOUT CONTRAST TECHNIQUE: Contiguous axial images were obtained from the base of the skull through the vertex without intravenous contrast. COMPARISON:  None. FINDINGS: Brain: There is atrophy and chronic  small vessel disease changes. No acute intracranial abnormality. Specifically, no hemorrhage, hydrocephalus, mass lesion, acute infarction, or significant intracranial injury. Vascular: No  hyperdense vessel or unexpected calcification. Skull: No acute calvarial abnormality. Sinuses/Orbits: Visualized paranasal sinuses and mastoids clear. Orbital soft tissues unremarkable. Other: None IMPRESSION: Atrophy, chronic microvascular disease. No acute intracranial abnormality. Electronically Signed   By: Rolm Baptise M.D.   On: 03/15/2020 21:39   CT ABDOMEN PELVIS W CONTRAST  Result Date: 03/15/2020 CLINICAL DATA:  Diarrhea. Abdominal tenderness. Elevated lipase. History of prostate cancer. EXAM: CT ABDOMEN AND PELVIS WITH CONTRAST TECHNIQUE: Multidetector CT imaging of the abdomen and pelvis was performed using the standard protocol following bolus administration of intravenous contrast. CONTRAST:  46mL OMNIPAQUE IOHEXOL 300 MG/ML  SOLN COMPARISON:  None. FINDINGS: Lower chest: Collapse/consolidative changes noted in the lower lobes bilaterally, left greater than right. No substantial pleural effusion. Hepatobiliary: The liver shows diffusely decreased attenuation suggesting fat deposition. No suspicious focal abnormality within the liver parenchyma. There is no evidence for gallstones, gallbladder wall thickening, or pericholecystic fluid. No intrahepatic or extrahepatic biliary dilation. Pancreas: Subtle peripancreatic edema evident with either irregular substantial dilatation of the pancreatic duct in the tail of pancreas versus cystic pancreatic lesion measuring on the order of 3.1 x 1.8 cm. Spleen: Edema noted in the splenic hilum. Spleen otherwise unremarkable. Adrenals/Urinary Tract: No adrenal nodule or mass. Right kidney unremarkable. 10 mm hypoattenuating lesion noted lower pole left kidney with attenuation too high to be a simple cyst. No evidence for hydroureter. The urinary bladder appears normal for the degree of distention. Stomach/Bowel: Stomach is nondistended. 2.2 x 1.4 cm soft tissue lesion or fluid collection involving the posterior wall of the gastric fundus appears to communicate  with the pancreatic tail lesion. Duodenum is normally positioned as is the ligament of Treitz. No small bowel wall thickening. No small bowel dilatation. The terminal ileum is normal. The appendix is best seen on coronal images and is unremarkable. No gross colonic mass. No colonic wall thickening. Vascular/Lymphatic: No abdominal aortic aneurysm. No abdominal aortic atherosclerotic calcification. There is no gastrohepatic or hepatoduodenal ligament lymphadenopathy. No retroperitoneal or mesenteric lymphadenopathy. 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 Splenic vein may well be occluded. Reproductive: Fiducial markers noted in the prostate gland. Other: No intraperitoneal free fluid. Musculoskeletal: No worrisome lytic or sclerotic osseous abnormality. IMPRESSION: 1. Thrombus is identified in the portosplenic confluence, involving the superior mesenteric vein and portal vein. Splenic vein may well be occluded. 2. Subtle peripancreatic edema with either irregular substantial dilatation of the pancreatic duct in the tail of pancreas versus ill-defined cystic or hypoenhancing pancreatic lesion measuring on the order of 3.1 x 1.8 cm. Imaging features raise concern for pancreatic neoplasm although this could simply represent pancreatitis with parenchymal pseudocyst. Abdominal MRI without and with contrast may prove helpful to further evaluate. 3. 2.2 x 1.4 cm soft tissue lesion or fluid collection involving the posterior wall of the gastric fundus appears to communicate with the pancreatic tail lesion. 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 4. Collapse/consolidative changes in the lower lobes bilaterally, left greater than right. 5. Hepatic steatosis. 6. 10 mm hypoattenuating lesion lower pole left kidney with attenuation too high to be a simple cyst. This may be a cyst complicated by proteinaceous debris or hemorrhage, but neoplasm cannot be  excluded. This could also be further evaluated the time of follow-up MRI. Electronically Signed   By: Misty Stanley M.D.   On:  03/15/2020 17:04   DG Chest Port 1 View  Result Date: 03/04/2020 CLINICAL DATA:  Fever, generalized weakness. EXAM: PORTABLE CHEST 1 VIEW COMPARISON:  Chest x-rays dated 02/07/2020 and 08/13/2018. FINDINGS: Heart size and mediastinal contours are stable. Lungs are clear. No pleural effusion is seen. Osseous structures about the chest are unremarkable. IMPRESSION: No active disease. No evidence of pneumonia or pulmonary edema. Electronically Signed   By: Franki Cabot M.D.   On: 03/04/2020 14:54   ECHOCARDIOGRAM COMPLETE  Result Date: 03/19/2020    ECHOCARDIOGRAM REPORT   Patient Name:   Clifford [name] Date of Exam: 03/19/2020 Medical Rec #:  [id number]         Height:       66.0 in Accession #:    [id number]        Weight:       135.0 lb Date of Birth:  04-Feb-1951          BSA:          1.692 m Patient Age:    26 years          BP:           141/81 mmHg Patient Gender: M                 HR:           85 bpm. Exam Location:  [facility] Procedure: 2D Echo Indications:    Mesenteric thrombosis ([place]) [id number]  History:        Patient has no prior history of Echocardiogram examinations.                 Risk Factors:Hypertension, Dyslipidemia and Non-Smoker.                 Alcoholism, Sepsis.  Sonographer:    Leavy Cella RDCS (AE) Referring Phys: [phone] Mazey Mantell IMPRESSIONS  1. Left ventricular ejection fraction, by estimation, is 55 to 60%. The left ventricle has normal function. The left ventricle has no regional wall motion abnormalities. Left ventricular diastolic parameters were normal.  2. Right ventricular systolic function is low normal. The right ventricular size is normal. Tricuspid regurgitation signal is inadequate for assessing PA pressure.  3. The mitral valve is grossly normal. Mild mitral valve regurgitation.  4. The aortic valve is tricuspid. Aortic valve regurgitation  is not visualized.  5. The inferior vena cava is normal in size with greater than 50% respiratory variability, suggesting right atrial pressure of 3 mmHg. FINDINGS  Left Ventricle: Left ventricular ejection fraction, by estimation, is 55 to 60%. The left ventricle has normal function. The left ventricle has no regional wall motion abnormalities. The left ventricular internal cavity size was normal in size. There is  no left ventricular hypertrophy. Left ventricular diastolic parameters were normal. Right Ventricle: The right ventricular size is normal. No increase in right ventricular wall thickness. Right ventricular systolic function is low normal. Tricuspid regurgitation signal is inadequate for assessing PA pressure. Left Atrium: Left atrial size was normal in size. Right Atrium: Right atrial size was normal in size. Pericardium: Trivial pericardial effusion is present. The pericardial effusion is posterior to the left ventricle. Mitral Valve: The mitral valve is grossly normal. Mild mitral valve regurgitation. Tricuspid Valve: The tricuspid valve is grossly normal. Tricuspid valve regurgitation is mild. Aortic Valve: The aortic valve is tricuspid. Aortic valve regurgitation is not visualized. Pulmonic Valve: The pulmonic valve was grossly normal. Pulmonic valve regurgitation is trivial. Aorta: The aortic  root is normal in size and structure. Venous: The inferior vena cava is normal in size with greater than 50% respiratory variability, suggesting right atrial pressure of 3 mmHg. IAS/Shunts: No atrial level shunt detected by color flow Doppler. Rozann Lesches MD Electronically signed by Rozann Lesches MD Signature Date/Time: 03/19/2020/11:01:10 AM    Final     Orson Eva, DO  [facility]  If 7PM-7AM, please contact night-coverage [email or web address] Password [email or web address] 03/19/2020, 5:00 PM   LOS: 4 days

## 2020-03-19 NOTE — Care Management Important Message (Signed)
Important Message  Patient Details  Name: Clifford [name] MRN: [id number] Date of Birth: 03-09-1951   Medicare Important Message Given:  Yes     [name] 03/19/2020, 3:42 PM

## 2020-03-19 NOTE — Progress Notes (Addendum)
ANTICOAGULATION CONSULT NOTE -   Pharmacy Consult for heparin Indication: portal vein thrombosis  Allergies  Allergen Reactions  . Lisinopril Swelling    Patient Measurements: Height: 5\' 6"  (167.6 cm) Weight: 61.2 kg (135 lb) IBW/kg (Calculated) : 63.8 Heparin Dosing Weight: 61.2 kg  Vital Signs: Temp: 99.2 F (37.3 C) (05/07 0540) Temp Source: Oral (05/07 0540) BP: 141/81 (05/07 0540) Pulse Rate: 85 (05/07 0540)  Labs: Recent Labs    03/17/20 0131 03/17/20 0131 03/17/20 [id number] 03/18/20 0444 03/18/20 0807 03/19/20 0508 03/19/20 0845  HGB 8.3*   < >  --  7.4*  --  7.5*  --   HCT 26.6*  --   --  23.1*  --  23.8*  --   PLT 36*  --   --  54*  --  103*  --   HEPARINUNFRC 0.39   < > 0.44  --  0.34  --  0.21*  CREATININE 1.76*  --   --  1.45*  --  1.28*  --    < > = values in this interval not displayed.    Estimated Creatinine Clearance: 47.1 mL/min (A) (by C-G formula based on SCr of 1.28 mg/dL (H)).    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   he has a h/o GI bleed.  Okay with heme/onc to start as CT head is negative for bleed.  He was not on anticoagulation PTA.  HL 0.21- subtherapeutic Platelets 103 hgb 7.5  Goal of Therapy:  Heparin level 0.3-0.7 units/ml target lower end of goal Monitor platelets by anticoagulation protocol: Yes     Plan:  Increase heparin infusion rate to 1250 units/hr  heparin level in 8 hours. Daily HL and CBC Monitor for bleeding complications Follow-up transition to oral anticoagulation  Margot Ables, PharmD Clinical Pharmacist 03/19/2020 11:03 AM

## 2020-03-20 DIAGNOSIS — K852 Alcohol induced acute pancreatitis without necrosis or infection: Secondary | ICD-10-CM

## 2020-03-20 LAB — URINALYSIS, ROUTINE W REFLEX MICROSCOPIC
Bacteria, UA: NONE SEEN
Bilirubin Urine: NEGATIVE
Glucose, UA: NEGATIVE mg/dL
Ketones, ur: NEGATIVE mg/dL
Leukocytes,Ua: NEGATIVE
Nitrite: NEGATIVE
Protein, ur: 30 mg/dL — AB
Specific Gravity, Urine: 1.012 (ref 1.005–1.030)
pH: 6 (ref 5.0–8.0)

## 2020-03-20 LAB — CBC
HCT: 22 % — ABNORMAL LOW (ref 39.0–52.0)
Hemoglobin: 7 g/dL — ABNORMAL LOW (ref 13.0–17.0)
MCH: 29.7 pg (ref 26.0–34.0)
MCHC: 31.8 g/dL (ref 30.0–36.0)
MCV: 93.2 fL (ref 80.0–100.0)
Platelets: 143 10*3/uL — ABNORMAL LOW (ref 150–400)
RBC: 2.36 MIL/uL — ABNORMAL LOW (ref 4.22–5.81)
RDW: 15.9 % — ABNORMAL HIGH (ref 11.5–15.5)
WBC: 11.4 10*3/uL — ABNORMAL HIGH (ref 4.0–10.5)
nRBC: 0.2 % (ref 0.0–0.2)

## 2020-03-20 LAB — COMPREHENSIVE METABOLIC PANEL
ALT: 14 U/L (ref 0–44)
AST: 33 U/L (ref 15–41)
Albumin: 1.8 g/dL — ABNORMAL LOW (ref 3.5–5.0)
Alkaline Phosphatase: 66 U/L (ref 38–126)
Anion gap: 8 (ref 5–15)
BUN: 15 mg/dL (ref 8–23)
CO2: 20 mmol/L — ABNORMAL LOW (ref 22–32)
Calcium: 7.1 mg/dL — ABNORMAL LOW (ref 8.9–10.3)
Chloride: 104 mmol/L (ref 98–111)
Creatinine, Ser: 1.24 mg/dL (ref 0.61–1.24)
GFR calc Af Amer: >60 mL/min (ref >60)
GFR calc non Af Amer: 59 mL/min — ABNORMAL LOW (ref >60)
Glucose, Bld: 121 mg/dL — ABNORMAL HIGH (ref 70–99)
Potassium: 3.4 mmol/L — ABNORMAL LOW (ref 3.5–5.1)
Sodium: 132 mmol/L — ABNORMAL LOW (ref 135–145)
Total Bilirubin: 1.1 mg/dL (ref 0.3–1.2)
Total Protein: 4.9 g/dL — ABNORMAL LOW (ref 6.5–8.1)

## 2020-03-20 LAB — RAPID URINE DRUG SCREEN, HOSP PERFORMED
Amphetamines: NOT DETECTED
Barbiturates: NOT DETECTED
Benzodiazepines: NOT DETECTED
Cocaine: NOT DETECTED
Opiates: NOT DETECTED
Tetrahydrocannabinol: NOT DETECTED

## 2020-03-20 LAB — PREPARE RBC (CROSSMATCH)

## 2020-03-20 LAB — HEPARIN LEVEL (UNFRACTIONATED)
Heparin Unfractionated: 0.38 IU/mL (ref 0.30–0.70)
Heparin Unfractionated: 0.41 IU/mL (ref 0.30–0.70)

## 2020-03-20 LAB — LIPASE, BLOOD: Lipase: 659 U/L — ABNORMAL HIGH (ref 11–51)

## 2020-03-20 MED ORDER — POTASSIUM CHLORIDE CRYS ER 20 MEQ PO TBCR
20.0000 meq | EXTENDED_RELEASE_TABLET | Freq: Once | ORAL | Status: AC
  Administered 2020-03-20: 16:00:00 20 meq via ORAL
  Filled 2020-03-20: qty 1

## 2020-03-20 MED ORDER — LOPERAMIDE HCL 2 MG PO CAPS
2.0000 mg | ORAL_CAPSULE | Freq: Three times a day (TID) | ORAL | Status: DC
  Administered 2020-03-20 – 2020-03-23 (×9): 2 mg via ORAL
  Filled 2020-03-20 (×9): qty 1

## 2020-03-20 MED ORDER — SODIUM CHLORIDE 0.9% IV SOLUTION: Freq: Once | INTRAVENOUS | Status: AC

## 2020-03-20 MED ORDER — PANCRELIPASE (LIP-PROT-AMYL) 36000-114000 UNITS PO CPEP
72000.0000 [IU] | ORAL_CAPSULE | Freq: Three times a day (TID) | ORAL | Status: DC
  Administered 2020-03-20 – 2020-03-23 (×9): 72000 [IU] via ORAL
  Filled 2020-03-20 (×9): qty 2

## 2020-03-20 NOTE — Progress Notes (Signed)
PROGRESS NOTE  Clifford [name] [id number] DOB: 04/21/1951 DOA: 03/15/2020 PCP: [name], FNP  Brief History: 69 year old male with a history of prostate cancer, alcohol abuse, thrombocytopenia, hypertension, hyperlipidemia, GI bleed presenting with generalized weakness and diarrhea. He denied any fevers, chills but has some dyspnea on exertion. There is no nausea or vomiting. There is no hematochezia or melena. The patient normally drinks 1 pint of gin on a daily basis with the last drink on 03/11/2020. His wife had stated that the patient had been confused over the last several days prior to admission. He has had poor oral intake. CT of the abdomen and pelvis showed thrombus in the SMV and portal vein as well as splenic vein. Hematology was consulted. The patient was started on IV heparin. There was subtle peripancreatic edema with dilatation of the pancreatic duct in the tail with a hypoenhancing lesion. There was concern for neoplasm versus pseudocyst versus pancreatitis. There was bilateral lower lobe consolidation.  Assessment/Plan: Sepsis -Present at the time of admission -Secondary to bacteremiaandpneumonia -Lactic acid peaked 1.8 -PCT 4.78 -lactic 1.8 -Continue ceftriaxone2 grams daily -sepsis physiology resolved  Klebsiella bacteremia -Continue ceftriaxone 2 g daily pending final culture data -Source is Pneumonia -recurrent fever likely due to pancreatitis -5/6 repeat blood culture--neg  Lobar pneumonia -Continue ceftriaxone  -Stable on room air  Portal vein and SMV thrombosis -Continue IV heparin -transition to po NOAC if no further invasive procedures planned -Appreciate hematology consult -Monitor platelet counts-->improving -Splenic vein may also be occluded -obtain echo--EF 55-60%, no WMA, mild MR/TR, no thrombi  Diarrhea -C. difficile negative -Stool pathogen panel--neg -Suspect this is likely due to pancreatic  insufficiency -Creon started by GI  Acute on chronic renal failure--CKD stage IIIa -Secondary to sepsis and volume depletion -Baseline creatinine 1.0-1.3 -Presented with serum creatinine 2.44 -Continue IV fluids  Acute on chronic pancreatitis -tolerating clears>>full liquid -diet advancement per GI -check lipid panel--triglycerides 117 -lipase peaked 1236 -continue IVF -Creon added by GI  Thrombocytopenia -Multifactorial including myelosuppression from his alcohol with acute worsening secondary to sepsisand acute clot -improving with tx of infection -HIT panel neg  Pancreatic tail hypodensity/mass -Appreciate GI consult--suspect the changes are likely due to chronic pancreatitis with small pseudocysts although cystic pancreatic neoplasm remains in the differential diagnosis  Deconditioning -PT eval-->SNF  Acute on chronic anemia -transfuse 2 units PRBC -FOBT -suspect dilution  Alcohol abuse -Alcohol withdrawal protocol -no signs of withdrawal  Hypokalemia -replete  GOC  -full code, full scope of care    Disposition Plan: Patient From: Home D/C Place: SNF when cleared by GI Barriers: Not Clinically Stable--not tolerating diet,rising lipase; GI plans continued work up;  Drop in Hgb requiring PRBC tranfusion  Family Communication:Spouse updatedat bedside 5/8  Consultants:GI, hematology, palliative  Code Status: FULL   DVT Prophylaxis:IVHeparin    Procedures: As Listed in Progress Note Above  Antibiotics: Ceftriaxone 5/3>>>    Total time spent 35 minutes.  Greater than 50% spent face to face counseling and coordinating care.   Subjective: Pt feels weak. Patient denies fevers, chills, headache, chest pain, dyspnea, nausea, vomiting, diarrhea, abdominal pain, dysuria, hematuria, hematochezia, and melena.   Objective: Vitals:   03/19/20 1300 03/19/20 2204 03/20/20 0613 03/20/20 0800  BP: 136/77 138/78 131/76 131/76   Pulse: 80 82 84 84  Resp: 19 20 20    Temp: 99 F (37.2 C) 99.5 F (37.5 C) 98.8 F (37.1 C)   TempSrc: Oral  SpO2: 95% 99% 97%   Weight:      Height:        Intake/Output Summary (Last 24 hours) at 03/20/2020 1313 Last data filed at 03/20/2020 1030 Gross per 24 hour  Intake 360 ml  Output 600 ml  Net -240 ml   Weight change:  Exam:   General:  Pt is alert, follows commands appropriately, not in acute distress  HEENT: No icterus, No thrush, No neck mass, [place]/AT  Cardiovascular: RRR, S1/S2, no rubs, no gallops  Respiratory: CTA bilaterally, no wheezing, no crackles, no rhonchi  Abdomen: Soft/+BS, non tender, non distended, no guarding  Extremities: No edema, No lymphangitis, No petechiae, No rashes, no synovitis   Data Reviewed: I have personally reviewed following labs and imaging studies Basic Metabolic Panel: Recent Labs  Lab 03/16/20 0618 03/17/20 0131 03/18/20 0444 03/19/20 0508 03/20/20 0242  NA 136 133* 135 135 132*  K 4.0 3.4* 3.1* 3.4* 3.4*  CL 102 104 103 104 104  CO2 19* 16* 19* 20* 20*  GLUCOSE 105* 107* 111* 116* 121*  BUN 34* 31* 27* 19 15  CREATININE 1.92* 1.76* 1.45* 1.28* 1.24  CALCIUM 7.8* 7.3* 7.5* 7.3* 7.1*   Liver Function Tests: Recent Labs  Lab 03/15/20 1222 03/16/20 0618 03/18/20 0444 03/19/20 0508 03/20/20 0242  AST 62* 58* 48* 38 33  ALT 26 23 16 15 14   ALKPHOS 71 70 70 68 66  BILITOT 2.9* 2.5* 1.6* 1.1 1.1  PROT 6.7 6.3* 5.3* 5.1* 4.9*  ALBUMIN 3.0* 2.7* 2.0* 1.9* 1.8*   Recent Labs  Lab 03/16/20 0618 03/17/20 0131 03/17/20 0807 03/18/20 0444 03/19/20 0508 03/20/20 0242  LIPASE 820* 1,236*  --  563* 927* 659*  AMYLASE  --   --  519* 520*  --   --    No results for input(s): AMMONIA in the last 168 hours. Coagulation Profile: Recent Labs  Lab 03/15/20 2223 03/16/20 0618  INR 1.3* 1.2   CBC: Recent Labs  Lab 03/16/20 0618 03/17/20 0131 03/18/20 0444 03/19/20 0508 03/20/20 0242  WBC 9.2 10.2 9.1  10.4 11.4*  HGB 9.5* 8.3* 7.4* 7.5* 7.0*  HCT 31.1* 26.6* 23.1* 23.8* 22.0*  MCV 97.2 94.3 92.8 91.9 93.2  PLT 46* 36* 54* 103* 143*   Cardiac Enzymes: No results for input(s): CKTOTAL, CKMB, CKMBINDEX, TROPONINI in the last 168 hours. BNP: Invalid input(s): POCBNP CBG: Recent Labs  Lab 03/16/20 0740  GLUCAP 105*   HbA1C: No results for input(s): HGBA1C in the last 72 hours. Urine analysis:    Component Value Date/Time   COLORURINE YELLOW 03/04/2020 1609   APPEARANCEUR CLEAR 03/04/2020 1609   LABSPEC 1.015 03/04/2020 1609   PHURINE 5.0 03/04/2020 1609   GLUCOSEU NEGATIVE 03/04/2020 1609   HGBUR SMALL (A) 03/04/2020 1609   BILIRUBINUR NEGATIVE 03/04/2020 1609   KETONESUR NEGATIVE 03/04/2020 1609   PROTEINUR 30 (A) 03/04/2020 1609   NITRITE NEGATIVE 03/04/2020 1609   LEUKOCYTESUR NEGATIVE 03/04/2020 1609   Sepsis Labs: @LABRCNTIP (procalcitonin:4,lacticidven:4) ) Recent Results (from the past 240 hour(s))  Respiratory Panel by RT PCR (Flu A&B, Covid) - Nasopharyngeal Swab     Status: None   Collection Time: 03/15/20  3:52 PM   Specimen: Nasopharyngeal Swab  Result Value Ref Range Status   SARS Coronavirus 2 by RT PCR NEGATIVE NEGATIVE Final    Comment: (NOTE) SARS-CoV-2 target nucleic acids are NOT DETECTED. The SARS-CoV-2 RNA is generally detectable in upper respiratoy specimens during the acute phase of infection. The lowest  concentration of SARS-CoV-2 viral copies this assay can detect is 131 copies/mL. A negative result does not preclude SARS-Cov-2 infection and should not be used as the sole basis for treatment or other patient management decisions. A negative result may occur with  improper specimen collection/handling, submission of specimen other than nasopharyngeal swab, presence of viral mutation(s) within the areas targeted by this assay, and inadequate number of viral copies (<131 copies/mL). A negative result must be combined with clinical observations,  patient history, and epidemiological information. The expected result is Negative. Fact Sheet for Patients:  PinkCheek.be Fact Sheet for Healthcare Providers:  GravelBags.it This test is not yet ap proved or cleared by the [place] FDA and  has been authorized for detection and/or diagnosis of SARS-CoV-2 by FDA under an Emergency Use Authorization (EUA). This EUA will remain  in effect (meaning this test can be used) for the duration of the COVID-19 declaration under Section 564(b)(1) of the Act, 21 U.S.C. section 360bbb-3(b)(1), unless the authorization is terminated or revoked sooner.    Influenza A by PCR NEGATIVE NEGATIVE Final   Influenza B by PCR NEGATIVE NEGATIVE Final    Comment: (NOTE) The Xpert Xpress SARS-CoV-2/FLU/RSV assay is intended as an aid in  the diagnosis of influenza from Nasopharyngeal swab specimens and  should not be used as a sole basis for treatment. Nasal washings and  aspirates are unacceptable for Xpert Xpress SARS-CoV-2/FLU/RSV  testing. Fact Sheet for Patients: PinkCheek.be Fact Sheet for Healthcare Providers: GravelBags.it This test is not yet approved or cleared by the [place] FDA and  has been authorized for detection and/or diagnosis of SARS-CoV-2 by  FDA under an Emergency Use Authorization (EUA). This EUA will remain  in effect (meaning this test can be used) for the duration of the  Covid-19 declaration under Section 564(b)(1) of the Act, 21  U.S.C. section 360bbb-3(b)(1), unless the authorization is  terminated or revoked. Performed at [facility], [address], [place], [place] [address]   Blood culture (routine x 2)     Status: Abnormal   Collection Time: 03/15/20  3:52 PM   Specimen: BLOOD RIGHT WRIST  Result Value Ref Range Status   Specimen Description   Final    BLOOD RIGHT WRIST Performed at [facility], [address], [place],  [address]    Special Requests   Final    BOTTLES DRAWN AEROBIC AND ANAEROBIC Blood Culture adequate volume Performed at [facility], [address], [place],  [address]    Culture  Setup Time   Final    IN BOTH AEROBIC AND ANAEROBIC BOTTLES GRAM NEGATIVE RODS Gram Stain Report Called to,Read Back By and Verified With: T VILLALOBOS,RN @0433  03/16/20 MKELLY    Culture KLEBSIELLA PNEUMONIAE (A)  Final   Report Status 03/18/2020 FINAL  Final   Organism ID, Bacteria KLEBSIELLA PNEUMONIAE  Final      Susceptibility   Klebsiella pneumoniae - MIC*    AMPICILLIN >=32 RESISTANT Resistant     CEFAZOLIN 16 SENSITIVE Sensitive     CEFEPIME <=1 SENSITIVE Sensitive     CEFTAZIDIME <=1 SENSITIVE Sensitive     CEFTRIAXONE <=1 SENSITIVE Sensitive     CIPROFLOXACIN <=0.25 SENSITIVE Sensitive     GENTAMICIN <=1 SENSITIVE Sensitive     IMIPENEM <=0.25 SENSITIVE Sensitive     TRIMETH/SULFA <=20 SENSITIVE Sensitive     AMPICILLIN/SULBACTAM >=32 RESISTANT Resistant     PIP/TAZO 32 INTERMEDIATE Intermediate     * KLEBSIELLA PNEUMONIAE  Blood  Culture ID Panel (Reflexed)     Status: Abnormal   Collection Time: 03/15/20  3:52 PM  Result Value Ref Range Status   Enterococcus species NOT DETECTED NOT DETECTED Final   Listeria monocytogenes NOT DETECTED NOT DETECTED Final   Staphylococcus species NOT DETECTED NOT DETECTED Final   Staphylococcus aureus (BCID) NOT DETECTED NOT DETECTED Final   Streptococcus species NOT DETECTED NOT DETECTED Final   Streptococcus agalactiae NOT DETECTED NOT DETECTED Final   Streptococcus pneumoniae NOT DETECTED NOT DETECTED Final   Streptococcus pyogenes NOT DETECTED NOT DETECTED Final   Acinetobacter baumannii NOT DETECTED NOT DETECTED Final   Enterobacteriaceae species DETECTED (A) NOT DETECTED Final    Comment: Enterobacteriaceae represent a large family of gram-negative bacteria, not a single organism. CRITICAL RESULT CALLED TO,  READ BACK BY AND VERIFIED WITH: Deborra Medina PharmD 9:10 03/16/20 ([email or web address])    Enterobacter cloacae complex NOT DETECTED NOT DETECTED Final   Escherichia coli NOT DETECTED NOT DETECTED Final   Klebsiella oxytoca NOT DETECTED NOT DETECTED Final   Klebsiella pneumoniae DETECTED (A) NOT DETECTED Final    Comment: CRITICAL RESULT CALLED TO, READ BACK BY AND VERIFIED WITH: Deborra Medina PharmD 9:10 03/16/20 ([email or web address])    Proteus species NOT DETECTED NOT DETECTED Final   Serratia marcescens NOT DETECTED NOT DETECTED Final   Carbapenem resistance NOT DETECTED NOT DETECTED Final   Haemophilus influenzae NOT DETECTED NOT DETECTED Final   Neisseria meningitidis NOT DETECTED NOT DETECTED Final   Pseudomonas aeruginosa NOT DETECTED NOT DETECTED Final   Candida albicans NOT DETECTED NOT DETECTED Final   Candida glabrata NOT DETECTED NOT DETECTED Final   Candida krusei NOT DETECTED NOT DETECTED Final   Candida parapsilosis NOT DETECTED NOT DETECTED Final   Candida tropicalis NOT DETECTED NOT DETECTED Final    Comment: Performed at [facility], [place] [address], [place], [place] [address]  Blood culture (routine x 2)     Status: Abnormal   Collection Time: 03/15/20  3:53 PM   Specimen: BLOOD RIGHT HAND  Result Value Ref Range Status   Specimen Description   Final    BLOOD RIGHT HAND BOTTLES DRAWN AEROBIC AND ANAEROBIC Performed at [facility], [address], [place], [place] [address]    Special Requests   Final    Blood Culture adequate volume Performed at [facility], [address], [place], [place] [address]    Culture  Setup Time   Final    IN BOTH AEROBIC AND ANAEROBIC BOTTLES GRAM NEGATIVE RODS Gram Stain Report Called to,Read Back By and Verified With: T VILLALOBOS,RN @0432  03/16/20 [facility] Performed at [facility], [address] [place], [place] [address]    Culture (A)  Final    KLEBSIELLA PNEUMONIAE SUSCEPTIBILITIES PERFORMED ON PREVIOUS CULTURE WITHIN THE LAST 5 DAYS. Performed at  [facility], [place] [address], [place], [place] [address]    Report Status 03/18/2020 FINAL  Final  C Difficile Quick Screen w PCR reflex     Status: None   Collection Time: 03/16/20  2:37 PM   Specimen: STOOL  Result Value Ref Range Status   C Diff antigen NEGATIVE NEGATIVE Final   C Diff toxin NEGATIVE NEGATIVE Final   C Diff interpretation No C. difficile detected.  Final    Comment: Performed at [facility], [address] [place], [place] [address]  Gastrointestinal Panel by PCR , Stool     Status: None   Collection Time: 03/17/20  5:19 PM  Specimen: Stool  Result Value Ref Range Status   Campylobacter species NOT DETECTED NOT DETECTED Final   Plesimonas shigelloides NOT DETECTED NOT DETECTED Final   Salmonella species NOT DETECTED NOT DETECTED Final   Yersinia enterocolitica NOT DETECTED NOT DETECTED Final   Vibrio species NOT DETECTED NOT DETECTED Final   Vibrio cholerae NOT DETECTED NOT DETECTED Final   Enteroaggregative E coli (EAEC) NOT DETECTED NOT DETECTED Final   Enteropathogenic E coli (EPEC) NOT DETECTED NOT DETECTED Final   Enterotoxigenic E coli (ETEC) NOT DETECTED NOT DETECTED Final   Shiga like toxin producing E coli (STEC) NOT DETECTED NOT DETECTED Final   Shigella/Enteroinvasive E coli (EIEC) NOT DETECTED NOT DETECTED Final   Cryptosporidium NOT DETECTED NOT DETECTED Final   Cyclospora cayetanensis NOT DETECTED NOT DETECTED Final   Entamoeba histolytica NOT DETECTED NOT DETECTED Final   Giardia lamblia NOT DETECTED NOT DETECTED Final   Adenovirus F40/41 NOT DETECTED NOT DETECTED Final   Astrovirus NOT DETECTED NOT DETECTED Final   Norovirus GI/GII NOT DETECTED NOT DETECTED Final   Rotavirus A NOT DETECTED NOT DETECTED Final   Sapovirus (I, II, IV, and V) NOT DETECTED NOT DETECTED Final    Comment: Performed at [facility], [place] [place], [place] [address]  Culture, blood (Routine X 2) w Reflex to ID Panel     Status: None  (Preliminary result)   Collection Time: 03/18/20  7:26 AM   Specimen: BLOOD RIGHT HAND  Result Value Ref Range Status   Specimen Description BLOOD RIGHT HAND  Final   Special Requests   Final    BOTTLES DRAWN AEROBIC AND ANAEROBIC Blood Culture results may not be optimal due to an inadequate volume of blood received in culture bottles   Culture   Final    NO GROWTH 2 DAYS Performed at [facility], [address] [place], [place] [address]    Report Status PENDING  Incomplete  Culture, blood (Routine X 2) w Reflex to ID Panel     Status: None (Preliminary result)   Collection Time: 03/18/20  7:26 AM   Specimen: BLOOD LEFT HAND  Result Value Ref Range Status   Specimen Description BLOOD LEFT HAND  Final   Special Requests   Final    BOTTLES DRAWN AEROBIC AND ANAEROBIC Blood Culture adequate volume   Culture   Final    NO GROWTH 2 DAYS Performed at [facility], [address], [place], [place] [address]    Report Status PENDING  Incomplete     Scheduled Meds: . folic acid  1 mg Oral Daily  . lipase/protease/amylase  48,000 Units Oral TID WC  . loperamide  2 mg Oral BID AC  . magnesium oxide  400 mg Oral Daily  . multivitamin with minerals  1 tablet Oral Daily  . pantoprazole  40 mg Oral BID AC  . sodium chloride flush  3 mL Intravenous Once  . thiamine  100 mg Oral Daily   Continuous Infusions: . cefTRIAXone (ROCEPHIN)  IV    . heparin 1,350 Units/hr (03/19/20 2317)  . lactated ringers 75 mL/hr at 03/19/20 1757    Procedures/Studies: DG Chest 2 View  Result Date: 03/15/2020 CLINICAL DATA:  [place] EXAM: CHEST - 2 VIEW COMPARISON:  03/04/2020 FINDINGS: Heart is normal size. Left lower lobe atelectasis or infiltrate noted. No confluent opacity on the right. No effusions or acute bony abnormality. IMPRESSION: Left basilar atelectasis or infiltrate. Electronically Signed   By: Rolm Baptise M.D.  On: 03/15/2020 21:40   CT HEAD WO CONTRAST  Result Date: 03/15/2020 CLINICAL DATA:   Encephalopathy EXAM: CT HEAD WITHOUT CONTRAST TECHNIQUE: Contiguous axial images were obtained from the base of the skull through the vertex without intravenous contrast. COMPARISON:  None. FINDINGS: Brain: There is atrophy and chronic small vessel disease changes. No acute intracranial abnormality. Specifically, no hemorrhage, hydrocephalus, mass lesion, acute infarction, or significant intracranial injury. Vascular: No hyperdense vessel or unexpected calcification. Skull: No acute calvarial abnormality. Sinuses/Orbits: Visualized paranasal sinuses and mastoids clear. Orbital soft tissues unremarkable. Other: None IMPRESSION: Atrophy, chronic microvascular disease. No acute intracranial abnormality. Electronically Signed   By: Rolm Baptise M.D.   On: 03/15/2020 21:39   CT ABDOMEN PELVIS W CONTRAST  Result Date: 03/15/2020 CLINICAL DATA:  Diarrhea. Abdominal tenderness. Elevated lipase. History of prostate cancer. EXAM: CT ABDOMEN AND PELVIS WITH CONTRAST TECHNIQUE: Multidetector CT imaging of the abdomen and pelvis was performed using the standard protocol following bolus administration of intravenous contrast. CONTRAST:  44mL OMNIPAQUE IOHEXOL 300 MG/ML  SOLN COMPARISON:  None. FINDINGS: Lower chest: Collapse/consolidative changes noted in the lower lobes bilaterally, left greater than right. No substantial pleural effusion. Hepatobiliary: The liver shows diffusely decreased attenuation suggesting fat deposition. No suspicious focal abnormality within the liver parenchyma. There is no evidence for gallstones, gallbladder wall thickening, or pericholecystic fluid. No intrahepatic or extrahepatic biliary dilation. Pancreas: Subtle peripancreatic edema evident with either irregular substantial dilatation of the pancreatic duct in the tail of pancreas versus cystic pancreatic lesion measuring on the order of 3.1 x 1.8 cm. Spleen: Edema noted in the splenic hilum. Spleen otherwise unremarkable. Adrenals/Urinary  Tract: No adrenal nodule or mass. Right kidney unremarkable. 10 mm hypoattenuating lesion noted lower pole left kidney with attenuation too high to be a simple cyst. No evidence for hydroureter. The urinary bladder appears normal for the degree of distention. Stomach/Bowel: Stomach is nondistended. 2.2 x 1.4 cm soft tissue lesion or fluid collection involving the posterior wall of the gastric fundus appears to communicate with the pancreatic tail lesion. Duodenum is normally positioned as is the ligament of Treitz. No small bowel wall thickening. No small bowel dilatation. The terminal ileum is normal. The appendix is best seen on coronal images and is unremarkable. No gross colonic mass. No colonic wall thickening. Vascular/Lymphatic: No abdominal aortic aneurysm. No abdominal aortic atherosclerotic calcification. There is no gastrohepatic or hepatoduodenal ligament lymphadenopathy. No retroperitoneal or mesenteric lymphadenopathy. 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 Splenic vein may well be occluded. Reproductive: Fiducial markers noted in the prostate gland. Other: No intraperitoneal free fluid. Musculoskeletal: No worrisome lytic or sclerotic osseous abnormality. IMPRESSION: 1. Thrombus is identified in the portosplenic confluence, involving the superior mesenteric vein and portal vein. Splenic vein may well be occluded. 2. Subtle peripancreatic edema with either irregular substantial dilatation of the pancreatic duct in the tail of pancreas versus ill-defined cystic or hypoenhancing pancreatic lesion measuring on the order of 3.1 x 1.8 cm. Imaging features raise concern for pancreatic neoplasm although this could simply represent pancreatitis with parenchymal pseudocyst. Abdominal MRI without and with contrast may prove helpful to further evaluate. 3. 2.2 x 1.4 cm soft tissue lesion or fluid collection involving the posterior wall of  the gastric fundus appears to communicate with the pancreatic tail lesion. 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 4. Collapse/consolidative changes in the lower lobes bilaterally, left greater than right. 5. Hepatic steatosis. 6. 10 mm hypoattenuating lesion lower pole  left kidney with attenuation too high to be a simple cyst. This may be a cyst complicated by proteinaceous debris or hemorrhage, but neoplasm cannot be excluded. This could also be further evaluated the time of follow-up MRI. Electronically Signed   By: Misty Stanley M.D.   On: 03/15/2020 17:04   DG Chest Port 1 View  Result Date: 03/04/2020 CLINICAL DATA:  Fever, generalized weakness. EXAM: PORTABLE CHEST 1 VIEW COMPARISON:  Chest x-rays dated 02/07/2020 and 08/13/2018. FINDINGS: Heart size and mediastinal contours are stable. Lungs are clear. No pleural effusion is seen. Osseous structures about the chest are unremarkable. IMPRESSION: No active disease. No evidence of pneumonia or pulmonary edema. Electronically Signed   By: Franki Cabot M.D.   On: 03/04/2020 14:54   ECHOCARDIOGRAM COMPLETE  Result Date: 03/19/2020    ECHOCARDIOGRAM REPORT   Patient Name:   Clifford [name] Date of Exam: 03/19/2020 Medical Rec #:  [id number]         Height:       66.0 in Accession #:    [id number]        Weight:       135.0 lb Date of Birth:  Mar 15, 1951          BSA:          1.692 m Patient Age:    24 years          BP:           141/81 mmHg Patient Gender: M                 HR:           85 bpm. Exam Location:  [facility] Procedure: 2D Echo Indications:    Mesenteric thrombosis ([place]) [id number]  History:        Patient has no prior history of Echocardiogram examinations.                 Risk Factors:Hypertension, Dyslipidemia and Non-Smoker.                 Alcoholism, Sepsis.  Sonographer:    Leavy Cella RDCS (AE) Referring Phys: [phone] Renetta Suman IMPRESSIONS  1. Left ventricular ejection fraction, by estimation, is 55 to 60%. The left ventricle has normal  function. The left ventricle has no regional wall motion abnormalities. Left ventricular diastolic parameters were normal.  2. Right ventricular systolic function is low normal. The right ventricular size is normal. Tricuspid regurgitation signal is inadequate for assessing PA pressure.  3. The mitral valve is grossly normal. Mild mitral valve regurgitation.  4. The aortic valve is tricuspid. Aortic valve regurgitation is not visualized.  5. The inferior vena cava is normal in size with greater than 50% respiratory variability, suggesting right atrial pressure of 3 mmHg. FINDINGS  Left Ventricle: Left ventricular ejection fraction, by estimation, is 55 to 60%. The left ventricle has normal function. The left ventricle has no regional wall motion abnormalities. The left ventricular internal cavity size was normal in size. There is  no left ventricular hypertrophy. Left ventricular diastolic parameters were normal. Right Ventricle: The right ventricular size is normal. No increase in right ventricular wall thickness. Right ventricular systolic function is low normal. Tricuspid regurgitation signal is inadequate for assessing PA pressure. Left Atrium: Left atrial size was normal in size. Right Atrium: Right atrial size was normal in size. Pericardium: Trivial pericardial effusion is present. The pericardial effusion is posterior to the left ventricle. Mitral Valve: The mitral  valve is grossly normal. Mild mitral valve regurgitation. Tricuspid Valve: The tricuspid valve is grossly normal. Tricuspid valve regurgitation is mild. Aortic Valve: The aortic valve is tricuspid. Aortic valve regurgitation is not visualized. Pulmonic Valve: The pulmonic valve was grossly normal. Pulmonic valve regurgitation is trivial. Aorta: The aortic root is normal in size and structure. Venous: The inferior vena cava is normal in size with greater than 50% respiratory variability, suggesting right atrial pressure of 3 mmHg. IAS/Shunts: No  atrial level shunt detected by color flow Doppler. Rozann Lesches MD Electronically signed by Rozann Lesches MD Signature Date/Time: 03/19/2020/11:01:10 AM    Final     Orson Eva, DO  [facility]  If 7PM-7AM, please contact night-coverage [email or web address] Password [email or web address] 03/20/2020, 1:13 PM   LOS: 5 days

## 2020-03-20 NOTE — Progress Notes (Signed)
CSW received a message from pt's provider stating family has agreed to placement and provided verbal permission for pt's referral to be sent out via the hub.  Per chart review pt's family states pt has been experiencing confusion.  CSW called and spoke to pt's wife Queshawn Simcik at ph: [phone] and pt's wife stated that she isd planning on taking pt home with [facility] PT/OT at D/C and that she, the pt and the pt's sons are not interested in placement at this time.  CSW stated that [facility] CM/CSW Dept would be standing by should pt(pt's family) change his mind and to please feel free to call back with questions and concerns today or tomorrow to [phone].  Pt's wife voiced understanding that the pt would likely be in the hospital for some time prior to D/C.  Pt's wife was appreciative and thanked the CSW.  EDP/RN updated.  CSW will continue to follow for D/C needs.  Alphonse Guild. Alleene Stoy  MSW, LCSW, LCAS, CSI Transitions of Care Clinical Social Worker Care Coordination Department Ph: [phone]

## 2020-03-20 NOTE — Progress Notes (Signed)
ANTICOAGULATION CONSULT NOTE  Pharmacy Consult for Heparin Indication: portal vein thrombosis  Allergies  Allergen Reactions  . Lisinopril Swelling    Patient Measurements: Height: 5\' 6"  (167.6 cm) Weight: 61.2 kg (135 lb) IBW/kg (Calculated) : 63.8 Heparin Dosing Weight: 61.2 kg  Vital Signs: Temp: 98.8 F (37.1 C) (05/08 0613) BP: 131/76 (05/08 0613) Pulse Rate: 84 (05/08 0613)  Labs: Recent Labs    03/18/20 0444 03/18/20 0807 03/19/20 0508 03/19/20 0845 03/19/20 1817 03/20/20 0242  HGB 7.4*   < > 7.5*  --   --  7.0*  HCT 23.1*  --  23.8*  --   --  22.0*  PLT 54*  --  103*  --   --  143*  HEPARINUNFRC  --    < >  --  0.21* 0.27* 0.38  CREATININE 1.45*  --  1.28*  --   --  1.24   < > = values in this interval not displayed.    Estimated Creatinine Clearance: 48.7 mL/min (by C-G formula based on SCr of 1.24 mg/dL).  Assessment: 69 yr old man with portal vein thrombosis; pharmacy was consulted to start heparin therapy.  Pt has hx of GI bleed; Hematology/Oncology okay with starting heparin, as head CT is negative for bleed. Pt was no on anticoagulation PTA.    Heparin level 0.38 - therapeutic H/H 7.0/22 , platelets 143 (improved).  Goal of Therapy:  Heparin level 0.3-0.7 units/ml (target lower end of goal) Monitor platelets by anticoagulation protocol: Yes   Plan:  Continue heparin infusion rate at 1350 units/hr  Check heparin level in 6-8 hours Monitor daily HL and CBC Monitor for bleeding complications Follow-up transition to oral anticoagulation  Margot Ables, PharmD Clinical Pharmacist 03/20/2020 7:50 AM

## 2020-03-20 NOTE — Progress Notes (Signed)
ANTICOAGULATION CONSULT NOTE  Pharmacy Consult for Heparin Indication: portal vein thrombosis  Allergies  Allergen Reactions  . Lisinopril Swelling    Patient Measurements: Height: 5\' 6"  (167.6 cm) Weight: 61.2 kg (135 lb) IBW/kg (Calculated) : 63.8 Heparin Dosing Weight: 61.2 kg  Vital Signs: Temp: 98.8 F (37.1 C) (05/08 0613) BP: 131/76 (05/08 0613) Pulse Rate: 84 (05/08 0613)  Labs: Recent Labs    03/18/20 0444 03/18/20 0807 03/19/20 0508 03/19/20 0845 03/19/20 1817 03/20/20 0242 03/20/20 1200  HGB 7.4*   < > 7.5*  --   --  7.0*  --   HCT 23.1*  --  23.8*  --   --  22.0*  --   PLT 54*  --  103*  --   --  143*  --   HEPARINUNFRC  --    < >  --    < > 0.27* 0.38 0.41  CREATININE 1.45*  --  1.28*  --   --  1.24  --    < > = values in this interval not displayed.    Estimated Creatinine Clearance: 48.7 mL/min (by C-G formula based on SCr of 1.24 mg/dL).  Assessment: 69 yr old man with portal vein thrombosis; pharmacy was consulted to start heparin therapy.  Pt has hx of GI bleed; Hematology/Oncology okay with starting heparin, as head CT is negative for bleed. Pt was no on anticoagulation PTA.    Heparin level 0.41 - therapeutic H/H 7.0/22 , platelets 143 (improved).  Goal of Therapy:  Heparin level 0.3-0.7 units/ml (target lower end of goal) Monitor platelets by anticoagulation protocol: Yes   Plan:  Continue heparin infusion rate at 1350 units/hr  Monitor daily HL and CBC daily Monitor for bleeding complications Follow-up transition to oral anticoagulation  Margot Ables, PharmD Clinical Pharmacist 03/20/2020 12:48 PM

## 2020-03-20 NOTE — Evaluation (Addendum)
Physical Therapy Evaluation Patient Details Name: Clifford [name] MRN: [id number] DOB: Apr 10, 1951 Today's Date: 03/20/2020   History of Present Illness  Clifford [name] is a 69 y.o. male with medical history significant for prostate cancer, alcoholism.Patient presented to the ED with complaints of about 1 week of black and tarry stools, dizziness when standing with generalized weakness.  He denies abdominal pain, no vomiting.  He denies NSAID use.    Clinical Impression  Patient performed bed mobility and transfers with assistance and use of RW to improve balance. Patient with some unsteadiness on feet. Once patient in standing noted runny stools and took patient to the bathroom. Patient with fatigue so returned to bed following bathroom use. Patient required assistance to return to the bed and to be situated. Returned to room with nursing staff following this to assist with helping to clean the patient and replace the linens on the bed. Patient would benefit from continued skilled physical therapy while in the hospital and at recommended venue below prior to returning home.     Follow Up Recommendations SNF    Equipment Recommendations  None recommended by PT    Recommendations for Other Services       Precautions / Restrictions Precautions Precautions: Fall Restrictions Weight Bearing Restrictions: No      Mobility  Bed Mobility Overal bed mobility: Needs Assistance Bed Mobility: Supine to Sit     Supine to sit: Min assist     General bed mobility comments: increased time, labored movement  Transfers Overall transfer level: Needs assistance Equipment used: Rolling walker (2 wheeled) Transfers: Sit to/from [facility] Sit to Stand: Min assist Stand pivot transfers: Min assist       General transfer comment: Requires UE support using RW for safety  Ambulation/Gait Ambulation/Gait assistance: Min assist Gait Distance (Feet): 15 Feet(x2) Assistive  device: Rolling walker (2 wheeled) Gait Pattern/deviations: Decreased step length - left;Decreased stance time - right;Decreased stride length Gait velocity: decreased   General Gait Details: slow cadence with unsteadiness on feet limited secondary to fatigue  Stairs            Wheelchair Mobility    Modified Rankin (Stroke Patients Only)       Balance Overall balance assessment: Needs assistance Sitting-balance support: Feet supported;No upper extremity supported Sitting balance-Leahy Scale: Good Sitting balance - Comments: seated at EOB   Standing balance support: During functional activity;Bilateral upper extremity supported Standing balance-Leahy Scale: Fair Standing balance comment: fair/good using RW                             Pertinent Vitals/Pain Pain Assessment: No/denies pain    Home Living Family/patient expects to be discharged to:: Private residence Living Arrangements: Spouse/significant other Available Help at Discharge: Family;Available 24 hours/day Type of Home: House Home Access: Stairs to enter Entrance Stairs-Rails: Right;Left;Can reach both Entrance Stairs-Number of Steps: 4-5 Home Layout: Able to live on main level with bedroom/bathroom;Laundry or work area in basement;Two level Home Equipment: Walker - 2 wheels;Cane - single point;Bedside commode;Wheelchair - manual;Shower seat      Prior Function Level of Independence: Independent               Hand Dominance        Extremity/Trunk Assessment   Upper Extremity Assessment Upper Extremity Assessment: Generalized weakness    Lower Extremity Assessment Lower Extremity Assessment: Generalized weakness    Cervical / Trunk Assessment Cervical /  Trunk Assessment: Normal  Communication   Communication: No difficulties  Cognition Arousal/Alertness: Awake/alert Behavior During Therapy: WFL for tasks assessed/performed Overall Cognitive Status: Within Functional  Limits for tasks assessed                                        General Comments      Exercises     Assessment/Plan    PT Assessment Patient needs continued PT services  PT Problem List Decreased strength;Decreased activity tolerance;Decreased balance;Decreased mobility       PT Treatment Interventions Balance training;Gait training;Stair training;Functional mobility training;Therapeutic activities;Therapeutic exercise;Patient/family education    PT Goals (Current goals can be found in the Care Plan section)  Acute Rehab PT Goals Patient Stated Goal: Recieve more therapy prior to discharge home PT Goal Formulation: With patient Time For Goal Achievement: 03/27/20 Potential to Achieve Goals: Fair    Frequency Min 3X/week   Barriers to discharge        Co-evaluation               AM-PAC PT "6 Clicks" Mobility  Outcome Measure Help needed turning from your back to your side while in a flat bed without using bedrails?: A Lot Help needed moving from lying on your back to sitting on the side of a flat bed without using bedrails?: A Lot Help needed moving to and from a bed to a chair (including a wheelchair)?: A Little Help needed standing up from a chair using your arms (e.g., wheelchair or bedside chair)?: A Lot Help needed to walk in hospital room?: A Little Help needed climbing 3-5 steps with a railing? : A Lot 6 Click Score: 14    End of Session Equipment Utilized During Treatment: Gait belt Activity Tolerance: Patient tolerated treatment well;Patient limited by fatigue Patient left: in bed;with call bell/phone within reach;with bed alarm set Nurse Communication: Mobility status;Other (comment)(Called Nursing twice for assistance with cleaning, due to unable to leave patient alone. They did not arrive while with patient, so notified after that patient may require further assistance due to runny stool.) PT Visit Diagnosis: Unsteadiness on feet  (R26.81);Other abnormalities of gait and mobility (R26.89);Muscle weakness (generalized) (M62.81)    Time: [id number] PT Time Calculation (min) (ACUTE ONLY): 30 min   Charges:   PT Evaluation $PT Eval Moderate Complexity: 1 Mod         Clarene Critchley PT, DPT 10:18 AM, 03/20/20 [phone]

## 2020-03-20 NOTE — Progress Notes (Signed)
   Assessment/Plan: ADMITTED WITH ACUTE PANCREATITIS/VENOUS THROMBOSIS. CLINICALLY IMPROVED BUT CONTINUES WITH LOOSE STOOLS. Hb DOWN, NO BRBPR OR MELENA.  PLAN : 1. SUPPORTIVE CARE. 2. INCREASE CREON TO 72K WITH MEALS. 3. CHANGE TO LACTOSE FREE/LOW FAT DIET. 4. INCREASE IMODIUM QAC TID    Subjective: Since last evaluated the patient HE DENIES NAUSEA/VOMTIING/ABDOMINAL PAIN. HAVING LOOSE STOOLS AND SOME FECAL INCONTINENCE.   Objective: Vital signs in last 24 hours: Vitals:   03/20/20 0613 03/20/20 0800  BP: 131/76 131/76  Pulse: 84 84  Resp: 20   Temp: 98.8 F (37.1 C)   SpO2: 97%    General appearance: alert, cooperative and no distress Resp: clear to auscultation bilaterally Cardio: regular rate and rhythm GI: soft, non-tender; bowel sounds normal;   Lab Results:  K 3.4 Cr 1.24 LIPASE 659 Hb 7.0 MCV 93.2 PT CT 143    Studies/Results: No results found.  Medications: I have reviewed the patient's current medications.

## 2020-03-20 NOTE — Plan of Care (Signed)
  Problem: Acute Rehab PT Goals(only PT should resolve) Goal: Pt will Roll Supine to Side Outcome: Progressing Flowsheets (Taken 03/20/2020 1031) Pt will Roll Supine to Side: with modified independence Goal: Patient Will Transfer Sit To/From Stand Outcome: Progressing Flowsheets (Taken 03/20/2020 1031) Patient will transfer sit to/from stand: with modified independence Goal: Pt Will Transfer Bed To Chair/Chair To Bed Outcome: Progressing Flowsheets (Taken 03/20/2020 1031) Pt will Transfer Bed to Chair/Chair to Bed: with modified independence Goal: Pt Will Ambulate Outcome: Progressing Flowsheets (Taken 03/20/2020 1031) Pt will Ambulate:  75 feet  with least restrictive assistive device Goal: Pt Will Go Up/Down Stairs Outcome: Progressing Flowsheets (Taken 03/20/2020 1031) Pt will Go Up / Down Stairs:  3-5 stairs  with modified independence   Clarene Critchley PT, DPT 10:33 AM, 03/20/20 [phone]

## 2020-03-21 LAB — BASIC METABOLIC PANEL
Anion gap: 12 (ref 5–15)
BUN: 11 mg/dL (ref 8–23)
CO2: 18 mmol/L — ABNORMAL LOW (ref 22–32)
Calcium: 7.3 mg/dL — ABNORMAL LOW (ref 8.9–10.3)
Chloride: 104 mmol/L (ref 98–111)
Creatinine, Ser: 1.14 mg/dL (ref 0.61–1.24)
GFR calc Af Amer: >60 mL/min (ref >60)
GFR calc non Af Amer: >60 mL/min (ref >60)
Glucose, Bld: 106 mg/dL — ABNORMAL HIGH (ref 70–99)
Potassium: 3.4 mmol/L — ABNORMAL LOW (ref 3.5–5.1)
Sodium: 134 mmol/L — ABNORMAL LOW (ref 135–145)

## 2020-03-21 LAB — TYPE AND SCREEN
ABO/RH(D): A POS
Antibody Screen: NEGATIVE
Unit division: 0
Unit division: 0

## 2020-03-21 LAB — CBC
HCT: 29.5 % — ABNORMAL LOW (ref 39.0–52.0)
Hemoglobin: 9.5 g/dL — ABNORMAL LOW (ref 13.0–17.0)
MCH: 29.3 pg (ref 26.0–34.0)
MCHC: 32.2 g/dL (ref 30.0–36.0)
MCV: 91 fL (ref 80.0–100.0)
Platelets: 156 10*3/uL (ref 150–400)
RBC: 3.24 MIL/uL — ABNORMAL LOW (ref 4.22–5.81)
RDW: 16.3 % — ABNORMAL HIGH (ref 11.5–15.5)
WBC: 12.6 10*3/uL — ABNORMAL HIGH (ref 4.0–10.5)
nRBC: 0 % (ref 0.0–0.2)

## 2020-03-21 LAB — BPAM RBC
Blood Product Expiration Date: 202105292359
Blood Product Expiration Date: 202105292359
ISSUE DATE / TIME: 202105081740
ISSUE DATE / TIME: 202105082121
Unit Type and Rh: 6200
Unit Type and Rh: 6200

## 2020-03-21 LAB — OCCULT BLOOD X 1 CARD TO LAB, STOOL: Fecal Occult Bld: NEGATIVE

## 2020-03-21 LAB — MAGNESIUM: Magnesium: 1.2 mg/dL — ABNORMAL LOW (ref 1.7–2.4)

## 2020-03-21 LAB — HEPARIN LEVEL (UNFRACTIONATED): Heparin Unfractionated: 0.31 IU/mL (ref 0.30–0.70)

## 2020-03-21 MED ORDER — POTASSIUM CHLORIDE CRYS ER 20 MEQ PO TBCR
20.0000 meq | EXTENDED_RELEASE_TABLET | Freq: Once | ORAL | Status: AC
  Administered 2020-03-21: 18:00:00 20 meq via ORAL
  Filled 2020-03-21: qty 1

## 2020-03-21 MED ORDER — MAGNESIUM SULFATE 2 GM/50ML IV SOLN
2.0000 g | Freq: Once | INTRAVENOUS | Status: AC
  Administered 2020-03-21: 2 g via INTRAVENOUS
  Filled 2020-03-21: qty 50

## 2020-03-21 NOTE — Progress Notes (Addendum)
PROGRESS NOTE  MARKEITH BORDONARO [id number] DOB: August 03, 1951 DOA: 03/15/2020 PCP: [name], FNP  Brief History: 69 year old male with a history of prostate cancer, alcohol abuse, thrombocytopenia, hypertension, hyperlipidemia, GI bleed presenting with generalized weakness and diarrhea. He denied any fevers, chills but has some dyspnea on exertion. There is no nausea or vomiting. There is no hematochezia or melena. The patient normally drinks 1 pint of gin on a daily basis with the last drink on 03/11/2020. His wife had stated that the patient had been confused over the last several days prior to admission. He has had poor oral intake. CT of the abdomen and pelvis showed thrombus in the SMV and portal vein as well as splenic vein. Hematology was consulted. The patient was started on IV heparin. There was subtle peripancreatic edema with dilatation of the pancreatic duct in the tail with a hypoenhancing lesion. There was concern for neoplasm versus pseudocyst versus pancreatitis. There was bilateral lower lobe consolidation.  Blood cultures showed Klebsiella pneumoniae.  The patient was continued on ceftriaxone 2 grams daily.  GI was consulted to assist with his acute on chronic pancreatitis.  Assessment/Plan: Sepsis -Present at the time of admission -Secondary to bacteremiaandpneumonia -Lactic acid peaked 1.8 -PCT 4.78 -lactic 1.8 -Continue ceftriaxone2 grams daily -sepsis physiology resolved  Klebsiella bacteremia -Continue ceftriaxone 2 g daily pending final culture data -Source is Pneumonia -recurrent fever likely due to pancreatitis -5/6 repeat blood culture--neg  Lobar pneumonia -Continue ceftriaxone  -Stable on room air  Portal vein and SMV thrombosis -Continue IV heparin -transition to po NOAC if no further invasive procedures planned -Appreciate hematology consult -Monitor platelet counts-->improving -Splenic vein may also be  occluded -obtain echo--EF 55-60%, no WMA, mild MR/TR, no thrombi  Diarrhea -C. difficile negative -Stool pathogen panel--neg -Suspect this is likely due to pancreatic insufficiency -Creon and loperamide started by GI  Acute on chronic renal failure--CKD stage IIIa -Secondary to sepsis and volume depletion -Baseline creatinine 1.0-1.3 -Presented with serum creatinine 2.44 -Continue IV fluids-->improved  Acute on chronic pancreatitis -tolerating clears>>full liquid>>>heart healthy -diet advancement per GI -check lipid panel--triglycerides 117 -lipase peaked 1236 -continue IVF>>>saline lock -Creon added by GI  Thrombocytopenia -Multifactorial including myelosuppression from his alcohol with acute worsening secondary to sepsisand acute clot -improving with tx of infection -HIT panel neg -am CBC  Pancreatic tail hypodensity/mass -Appreciate GI consult--suspect the changes are likely due to chronic pancreatitis with small pseudocysts although cystic pancreatic neoplasm remains in the differential diagnosis  Deconditioning -PT eval-->SNF  Acute on chronic anemia -transfused 2 units PRBC 5/8 -FOBT -suspect dilution  Alcohol abuse -Alcohol withdrawal protocol -no signs of withdrawal  Hypokalemia -replete  Hypomagnesemia -replete-->mag remains 1.2 -give IV Mag  GOC  -full code, full scope of care    Disposition Plan: Patient From: Home D/C Place: Home when cleared by GI--refuses SNF Barriers: Not Clinically Stable--electrolyte derangement. GI work up not finished; on IV abx  Family Communication:Spouse updatedat bedside 5/8  Consultants:GI, hematology, palliative  Code Status: FULL   DVT Prophylaxis:IVHeparin    Procedures: As Listed in Progress Note Above  Antibiotics: Ceftriaxone 5/3>>>      Subjective: Patient denies fevers, chills, headache, chest pain, dyspnea, nausea, vomiting, diarrhea, abdominal pain,  dysuria, hematuria, hematochezia, and melena.   Objective: Vitals:   03/20/20 2147 03/21/20 0015 03/21/20 0519 03/21/20 1353  BP: (!) 154/87 139/76 (!) 147/83 (!) 143/83  Pulse: 77 75 75 73  Resp: 16 16  18 16  Temp: 99.8 F (37.7 C) 99.7 F (37.6 C) 99.3 F (37.4 C) 98.8 F (37.1 C)  TempSrc: Oral Oral Oral Oral  SpO2: 97% 97% 97% 96%  Weight:      Height:        Intake/Output Summary (Last 24 hours) at 03/21/2020 1604 Last data filed at 03/21/2020 1411 Gross per 24 hour  Intake 2314.14 ml  Output --  Net 2314.14 ml   Weight change:  Exam:   General:  Pt is alert, follows commands appropriately, not in acute distress  HEENT: No icterus, No thrush, No neck mass, [place]/AT  Cardiovascular: RRR, S1/S2, no rubs, no gallops  Respiratory: bibasilar crackles. No wheeze  Abdomen: Soft/+BS, non tender, non distended, no guarding  Extremities: trace LE edema, No lymphangitis, No petechiae, No rashes, no synovitis   Data Reviewed: I have personally reviewed following labs and imaging studies Basic Metabolic Panel: Recent Labs  Lab 03/17/20 0131 03/18/20 0444 03/19/20 0508 03/20/20 0242 03/21/20 0607  NA 133* 135 135 132* 134*  K 3.4* 3.1* 3.4* 3.4* 3.4*  CL 104 103 104 104 104  CO2 16* 19* 20* 20* 18*  GLUCOSE 107* 111* 116* 121* 106*  BUN 31* 27* 19 15 11   CREATININE 1.76* 1.45* 1.28* 1.24 1.14  CALCIUM 7.3* 7.5* 7.3* 7.1* 7.3*  MG  --   --   --   --  1.2*   Liver Function Tests: Recent Labs  Lab 03/15/20 1222 03/16/20 0618 03/18/20 0444 03/19/20 0508 03/20/20 0242  AST 62* 58* 48* 38 33  ALT 26 23 16 15 14   ALKPHOS 71 70 70 68 66  BILITOT 2.9* 2.5* 1.6* 1.1 1.1  PROT 6.7 6.3* 5.3* 5.1* 4.9*  ALBUMIN 3.0* 2.7* 2.0* 1.9* 1.8*   Recent Labs  Lab 03/16/20 0618 03/17/20 0131 03/17/20 0807 03/18/20 0444 03/19/20 0508 03/20/20 0242  LIPASE 820* 1,236*  --  563* 927* 659*  AMYLASE  --   --  519* 520*  --   --    No results for input(s): AMMONIA in the  last 168 hours. Coagulation Profile: Recent Labs  Lab 03/15/20 2223 03/16/20 0618  INR 1.3* 1.2   CBC: Recent Labs  Lab 03/17/20 0131 03/18/20 0444 03/19/20 0508 03/20/20 0242 03/21/20 0607  WBC 10.2 9.1 10.4 11.4* 12.6*  HGB 8.3* 7.4* 7.5* 7.0* 9.5*  HCT 26.6* 23.1* 23.8* 22.0* 29.5*  MCV 94.3 92.8 91.9 93.2 91.0  PLT 36* 54* 103* 143* 156   Cardiac Enzymes: No results for input(s): CKTOTAL, CKMB, CKMBINDEX, TROPONINI in the last 168 hours. BNP: Invalid input(s): POCBNP CBG: Recent Labs  Lab 03/16/20 0740  GLUCAP 105*   HbA1C: No results for input(s): HGBA1C in the last 72 hours. Urine analysis:    Component Value Date/Time   COLORURINE YELLOW 03/20/2020 1700   APPEARANCEUR CLEAR 03/20/2020 1700   LABSPEC 1.012 03/20/2020 1700   PHURINE 6.0 03/20/2020 1700   GLUCOSEU NEGATIVE 03/20/2020 1700   HGBUR SMALL (A) 03/20/2020 1700   BILIRUBINUR NEGATIVE 03/20/2020 1700   KETONESUR NEGATIVE 03/20/2020 1700   PROTEINUR 30 (A) 03/20/2020 1700   NITRITE NEGATIVE 03/20/2020 1700   LEUKOCYTESUR NEGATIVE 03/20/2020 1700   Sepsis Labs: @LABRCNTIP (procalcitonin:4,lacticidven:4) ) Recent Results (from the past 240 hour(s))  Respiratory Panel by RT PCR (Flu A&B, Covid) - Nasopharyngeal Swab     Status: None   Collection Time: 03/15/20  3:52 PM   Specimen: Nasopharyngeal Swab  Result Value Ref Range Status   SARS  Coronavirus 2 by RT PCR NEGATIVE NEGATIVE Final    Comment: (NOTE) SARS-CoV-2 target nucleic acids are NOT DETECTED. The SARS-CoV-2 RNA is generally detectable in upper respiratoy specimens during the acute phase of infection. The lowest concentration of SARS-CoV-2 viral copies this assay can detect is 131 copies/mL. A negative result does not preclude SARS-Cov-2 infection and should not be used as the sole basis for treatment or other patient management decisions. A negative result may occur with  improper specimen collection/handling, submission of specimen  other than nasopharyngeal swab, presence of viral mutation(s) within the areas targeted by this assay, and inadequate number of viral copies (<131 copies/mL). A negative result must be combined with clinical observations, patient history, and epidemiological information. The expected result is Negative. Fact Sheet for Patients:  PinkCheek.be Fact Sheet for Healthcare Providers:  GravelBags.it This test is not yet ap proved or cleared by the [place] FDA and  has been authorized for detection and/or diagnosis of SARS-CoV-2 by FDA under an Emergency Use Authorization (EUA). This EUA will remain  in effect (meaning this test can be used) for the duration of the COVID-19 declaration under Section 564(b)(1) of the Act, 21 U.S.C. section 360bbb-3(b)(1), unless the authorization is terminated or revoked sooner.    Influenza A by PCR NEGATIVE NEGATIVE Final   Influenza B by PCR NEGATIVE NEGATIVE Final    Comment: (NOTE) The Xpert Xpress SARS-CoV-2/FLU/RSV assay is intended as an aid in  the diagnosis of influenza from Nasopharyngeal swab specimens and  should not be used as a sole basis for treatment. Nasal washings and  aspirates are unacceptable for Xpert Xpress SARS-CoV-2/FLU/RSV  testing. Fact Sheet for Patients: PinkCheek.be Fact Sheet for Healthcare Providers: GravelBags.it This test is not yet approved or cleared by the [place] FDA and  has been authorized for detection and/or diagnosis of SARS-CoV-2 by  FDA under an Emergency Use Authorization (EUA). This EUA will remain  in effect (meaning this test can be used) for the duration of the  Covid-19 declaration under Section 564(b)(1) of the Act, 21  U.S.C. section 360bbb-3(b)(1), unless the authorization is  terminated or revoked. Performed at [facility], [address] [place], [place] [address]   Blood  culture (routine x 2)     Status: Abnormal   Collection Time: 03/15/20  3:52 PM   Specimen: BLOOD RIGHT WRIST  Result Value Ref Range Status   Specimen Description   Final    BLOOD RIGHT WRIST Performed at [facility], [address], [place], [place] [address]    Special Requests   Final    BOTTLES DRAWN AEROBIC AND ANAEROBIC Blood Culture adequate volume Performed at [facility], [address], [place], [place] [address]    Culture  Setup Time   Final    IN BOTH AEROBIC AND ANAEROBIC BOTTLES GRAM NEGATIVE RODS Gram Stain Report Called to,Read Back By and Verified With: T VILLALOBOS,RN @0433  03/16/20 MKELLY    Culture KLEBSIELLA PNEUMONIAE (A)  Final   Report Status 03/18/2020 FINAL  Final   Organism ID, Bacteria KLEBSIELLA PNEUMONIAE  Final      Susceptibility   Klebsiella pneumoniae - MIC*    AMPICILLIN >=32 RESISTANT Resistant     CEFAZOLIN 16 SENSITIVE Sensitive     CEFEPIME <=1 SENSITIVE Sensitive     CEFTAZIDIME <=1 SENSITIVE Sensitive     CEFTRIAXONE <=1 SENSITIVE Sensitive     CIPROFLOXACIN <=0.25 SENSITIVE Sensitive     GENTAMICIN <=1 SENSITIVE Sensitive  IMIPENEM <=0.25 SENSITIVE Sensitive     TRIMETH/SULFA <=20 SENSITIVE Sensitive     AMPICILLIN/SULBACTAM >=32 RESISTANT Resistant     PIP/TAZO 32 INTERMEDIATE Intermediate     * KLEBSIELLA PNEUMONIAE  Blood Culture ID Panel (Reflexed)     Status: Abnormal   Collection Time: 03/15/20  3:52 PM  Result Value Ref Range Status   Enterococcus species NOT DETECTED NOT DETECTED Final   Listeria monocytogenes NOT DETECTED NOT DETECTED Final   Staphylococcus species NOT DETECTED NOT DETECTED Final   Staphylococcus aureus (BCID) NOT DETECTED NOT DETECTED Final   Streptococcus species NOT DETECTED NOT DETECTED Final   Streptococcus agalactiae NOT DETECTED NOT DETECTED Final   Streptococcus pneumoniae NOT DETECTED NOT DETECTED Final   Streptococcus pyogenes NOT DETECTED NOT DETECTED Final   Acinetobacter baumannii NOT  DETECTED NOT DETECTED Final   Enterobacteriaceae species DETECTED (A) NOT DETECTED Final    Comment: Enterobacteriaceae represent a large family of gram-negative bacteria, not a single organism. CRITICAL RESULT CALLED TO, READ BACK BY AND VERIFIED WITH: Deborra Medina PharmD 9:10 03/16/20 ([email or web address])    Enterobacter cloacae complex NOT DETECTED NOT DETECTED Final   Escherichia coli NOT DETECTED NOT DETECTED Final   Klebsiella oxytoca NOT DETECTED NOT DETECTED Final   Klebsiella pneumoniae DETECTED (A) NOT DETECTED Final    Comment: CRITICAL RESULT CALLED TO, READ BACK BY AND VERIFIED WITH: Deborra Medina PharmD 9:10 03/16/20 ([email or web address])    Proteus species NOT DETECTED NOT DETECTED Final   Serratia marcescens NOT DETECTED NOT DETECTED Final   Carbapenem resistance NOT DETECTED NOT DETECTED Final   Haemophilus influenzae NOT DETECTED NOT DETECTED Final   Neisseria meningitidis NOT DETECTED NOT DETECTED Final   Pseudomonas aeruginosa NOT DETECTED NOT DETECTED Final   Candida albicans NOT DETECTED NOT DETECTED Final   Candida glabrata NOT DETECTED NOT DETECTED Final   Candida krusei NOT DETECTED NOT DETECTED Final   Candida parapsilosis NOT DETECTED NOT DETECTED Final   Candida tropicalis NOT DETECTED NOT DETECTED Final    Comment: Performed at [facility], [place] [address], [place], [place] [address]  Blood culture (routine x 2)     Status: Abnormal   Collection Time: 03/15/20  3:53 PM   Specimen: BLOOD RIGHT HAND  Result Value Ref Range Status   Specimen Description   Final    BLOOD RIGHT HAND BOTTLES DRAWN AEROBIC AND ANAEROBIC Performed at [facility], [address], [place], [place] [address]    Special Requests   Final    Blood Culture adequate volume Performed at [facility], [address] [place], [place] [address]    Culture  Setup Time   Final    IN BOTH AEROBIC AND ANAEROBIC BOTTLES GRAM NEGATIVE RODS Gram Stain Report Called to,Read Back By and Verified With: T VILLALOBOS,RN @0432   03/16/20 MKELLY Performed at [facility], [address], [place], [place] [address]    Culture (A)  Final    KLEBSIELLA PNEUMONIAE SUSCEPTIBILITIES PERFORMED ON PREVIOUS CULTURE WITHIN THE LAST 5 DAYS. Performed at [facility], [place] [address], [place],  [address]    Report Status 03/18/2020 FINAL  Final  C Difficile Quick Screen w PCR reflex     Status: None   Collection Time: 03/16/20  2:37 PM   Specimen: STOOL  Result Value Ref Range Status   C Diff antigen NEGATIVE NEGATIVE Final   C Diff toxin NEGATIVE NEGATIVE Final   C Diff interpretation No C. difficile detected.  Final  Comment: Performed at [facility], [address] [place], [place] [address]  Gastrointestinal Panel by PCR , Stool     Status: None   Collection Time: 03/17/20  5:19 PM   Specimen: Stool  Result Value Ref Range Status   Campylobacter species NOT DETECTED NOT DETECTED Final   Plesimonas shigelloides NOT DETECTED NOT DETECTED Final   Salmonella species NOT DETECTED NOT DETECTED Final   Yersinia enterocolitica NOT DETECTED NOT DETECTED Final   Vibrio species NOT DETECTED NOT DETECTED Final   Vibrio cholerae NOT DETECTED NOT DETECTED Final   Enteroaggregative E coli (EAEC) NOT DETECTED NOT DETECTED Final   Enteropathogenic E coli (EPEC) NOT DETECTED NOT DETECTED Final   Enterotoxigenic E coli (ETEC) NOT DETECTED NOT DETECTED Final   Shiga like toxin producing E coli (STEC) NOT DETECTED NOT DETECTED Final   Shigella/Enteroinvasive E coli (EIEC) NOT DETECTED NOT DETECTED Final   Cryptosporidium NOT DETECTED NOT DETECTED Final   Cyclospora cayetanensis NOT DETECTED NOT DETECTED Final   Entamoeba histolytica NOT DETECTED NOT DETECTED Final   Giardia lamblia NOT DETECTED NOT DETECTED Final   Adenovirus F40/41 NOT DETECTED NOT DETECTED Final   Astrovirus NOT DETECTED NOT DETECTED Final   Norovirus GI/GII NOT DETECTED NOT DETECTED Final   Rotavirus A NOT DETECTED NOT DETECTED Final   Sapovirus  (I, II, IV, and V) NOT DETECTED NOT DETECTED Final    Comment: Performed at [facility], [address] [place], [place] [address]  Culture, blood (Routine X 2) w Reflex to ID Panel     Status: None (Preliminary result)   Collection Time: 03/18/20  7:26 AM   Specimen: BLOOD RIGHT HAND  Result Value Ref Range Status   Specimen Description BLOOD RIGHT HAND  Final   Special Requests   Final    BOTTLES DRAWN AEROBIC AND ANAEROBIC Blood Culture results may not be optimal due to an inadequate volume of blood received in culture bottles   Culture   Final    NO GROWTH 3 DAYS Performed at [facility], [address], [place], [place] [address]    Report Status PENDING  Incomplete  Culture, blood (Routine X 2) w Reflex to ID Panel     Status: None (Preliminary result)   Collection Time: 03/18/20  7:26 AM   Specimen: BLOOD LEFT HAND  Result Value Ref Range Status   Specimen Description BLOOD LEFT HAND  Final   Special Requests   Final    BOTTLES DRAWN AEROBIC AND ANAEROBIC Blood Culture adequate volume   Culture   Final    NO GROWTH 3 DAYS Performed at [facility], [address], [place], [place] [address]    Report Status PENDING  Incomplete     Scheduled Meds: . folic acid  1 mg Oral Daily  . lipase/protease/amylase  72,000 Units Oral TID WC  . loperamide  2 mg Oral TID AC  . magnesium oxide  400 mg Oral Daily  . multivitamin with minerals  1 tablet Oral Daily  . pantoprazole  40 mg Oral BID AC  . potassium chloride  20 mEq Oral Once  . sodium chloride flush  3 mL Intravenous Once  . thiamine  100 mg Oral Daily   Continuous Infusions: . cefTRIAXone (ROCEPHIN)  IV 2 g (03/20/20 1610)  . heparin 1,350 Units/hr (03/20/20 1958)  . lactated ringers 75 mL/hr at 03/21/20 1157  . magnesium sulfate bolus IVPB      Procedures/Studies: DG Chest 2 View  Result Date: 03/15/2020 CLINICAL DATA:  [place] EXAM: CHEST - 2 VIEW COMPARISON:  03/04/2020 FINDINGS: Heart is normal size.  Left lower lobe atelectasis or infiltrate noted. No confluent opacity on the right. No effusions or acute bony abnormality. IMPRESSION: Left basilar atelectasis or infiltrate. Electronically Signed   By: Rolm Baptise M.D.   On: 03/15/2020 21:40   CT HEAD WO CONTRAST  Result Date: 03/15/2020 CLINICAL DATA:  Encephalopathy EXAM: CT HEAD WITHOUT CONTRAST TECHNIQUE: Contiguous axial images were obtained from the base of the skull through the vertex without intravenous contrast. COMPARISON:  None. FINDINGS: Brain: There is atrophy and chronic small vessel disease changes. No acute intracranial abnormality. Specifically, no hemorrhage, hydrocephalus, mass lesion, acute infarction, or significant intracranial injury. Vascular: No hyperdense vessel or unexpected calcification. Skull: No acute calvarial abnormality. Sinuses/Orbits: Visualized paranasal sinuses and mastoids clear. Orbital soft tissues unremarkable. Other: None IMPRESSION: Atrophy, chronic microvascular disease. No acute intracranial abnormality. Electronically Signed   By: Rolm Baptise M.D.   On: 03/15/2020 21:39   CT ABDOMEN PELVIS W CONTRAST  Result Date: 03/15/2020 CLINICAL DATA:  Diarrhea. Abdominal tenderness. Elevated lipase. History of prostate cancer. EXAM: CT ABDOMEN AND PELVIS WITH CONTRAST TECHNIQUE: Multidetector CT imaging of the abdomen and pelvis was performed using the standard protocol following bolus administration of intravenous contrast. CONTRAST:  6mL OMNIPAQUE IOHEXOL 300 MG/ML  SOLN COMPARISON:  None. FINDINGS: Lower chest: Collapse/consolidative changes noted in the lower lobes bilaterally, left greater than right. No substantial pleural effusion. Hepatobiliary: The liver shows diffusely decreased attenuation suggesting fat deposition. No suspicious focal abnormality within the liver parenchyma. There is no evidence for gallstones, gallbladder wall thickening, or pericholecystic fluid. No intrahepatic or extrahepatic biliary  dilation. Pancreas: Subtle peripancreatic edema evident with either irregular substantial dilatation of the pancreatic duct in the tail of pancreas versus cystic pancreatic lesion measuring on the order of 3.1 x 1.8 cm. Spleen: Edema noted in the splenic hilum. Spleen otherwise unremarkable. Adrenals/Urinary Tract: No adrenal nodule or mass. Right kidney unremarkable. 10 mm hypoattenuating lesion noted lower pole left kidney with attenuation too high to be a simple cyst. No evidence for hydroureter. The urinary bladder appears normal for the degree of distention. Stomach/Bowel: Stomach is nondistended. 2.2 x 1.4 cm soft tissue lesion or fluid collection involving the posterior wall of the gastric fundus appears to communicate with the pancreatic tail lesion. Duodenum is normally positioned as is the ligament of Treitz. No small bowel wall thickening. No small bowel dilatation. The terminal ileum is normal. The appendix is best seen on coronal images and is unremarkable. No gross colonic mass. No colonic wall thickening. Vascular/Lymphatic: No abdominal aortic aneurysm. No abdominal aortic atherosclerotic calcification. There is no gastrohepatic or hepatoduodenal ligament lymphadenopathy. No retroperitoneal or mesenteric lymphadenopathy. 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 Splenic vein may well be occluded. Reproductive: Fiducial markers noted in the prostate gland. Other: No intraperitoneal free fluid. Musculoskeletal: No worrisome lytic or sclerotic osseous abnormality. IMPRESSION: 1. Thrombus is identified in the portosplenic confluence, involving the superior mesenteric vein and portal vein. Splenic vein may well be occluded. 2. Subtle peripancreatic edema with either irregular substantial dilatation of the pancreatic duct in the tail of pancreas versus ill-defined cystic or hypoenhancing pancreatic lesion measuring on the order of 3.1 x  1.8 cm. Imaging features raise concern for pancreatic neoplasm although this could simply represent pancreatitis with parenchymal pseudocyst. Abdominal MRI without and with contrast may prove helpful  to further evaluate. 3. 2.2 x 1.4 cm soft tissue lesion or fluid collection involving the posterior wall of the gastric fundus appears to communicate with the pancreatic tail lesion. 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 4. Collapse/consolidative changes in the lower lobes bilaterally, left greater than right. 5. Hepatic steatosis. 6. 10 mm hypoattenuating lesion lower pole left kidney with attenuation too high to be a simple cyst. This may be a cyst complicated by proteinaceous debris or hemorrhage, but neoplasm cannot be excluded. This could also be further evaluated the time of follow-up MRI. Electronically Signed   By: Misty Stanley M.D.   On: 03/15/2020 17:04   DG Chest Port 1 View  Result Date: 03/04/2020 CLINICAL DATA:  Fever, generalized weakness. EXAM: PORTABLE CHEST 1 VIEW COMPARISON:  Chest x-rays dated 02/07/2020 and 08/13/2018. FINDINGS: Heart size and mediastinal contours are stable. Lungs are clear. No pleural effusion is seen. Osseous structures about the chest are unremarkable. IMPRESSION: No active disease. No evidence of pneumonia or pulmonary edema. Electronically Signed   By: Franki Cabot M.D.   On: 03/04/2020 14:54   ECHOCARDIOGRAM COMPLETE  Result Date: 03/19/2020    ECHOCARDIOGRAM REPORT   Patient Name:   CANDIDO ZUKOSKI Date of Exam: 03/19/2020 Medical Rec #:  [id number]         Height:       66.0 in Accession #:    [id number]        Weight:       135.0 lb Date of Birth:  1951/04/05          BSA:          1.692 m Patient Age:    79 years          BP:           141/81 mmHg Patient Gender: M                 HR:           85 bpm. Exam Location:  [facility] Procedure: 2D Echo Indications:    Mesenteric thrombosis ([place]) [id number]  History:        Patient has no prior history of Echocardiogram  examinations.                 Risk Factors:Hypertension, Dyslipidemia and Non-Smoker.                 Alcoholism, Sepsis.  Sonographer:    Leavy Cella RDCS (AE) Referring Phys: [id number] Sherin Murdoch IMPRESSIONS  1. Left ventricular ejection fraction, by estimation, is 55 to 60%. The left ventricle has normal function. The left ventricle has no regional wall motion abnormalities. Left ventricular diastolic parameters were normal.  2. Right ventricular systolic function is low normal. The right ventricular size is normal. Tricuspid regurgitation signal is inadequate for assessing PA pressure.  3. The mitral valve is grossly normal. Mild mitral valve regurgitation.  4. The aortic valve is tricuspid. Aortic valve regurgitation is not visualized.  5. The inferior vena cava is normal in size with greater than 50% respiratory variability, suggesting right atrial pressure of 3 mmHg. FINDINGS  Left Ventricle: Left ventricular ejection fraction, by estimation, is 55 to 60%. The left ventricle has normal function. The left ventricle has no regional wall motion abnormalities. The left ventricular internal cavity size was normal in size. There is  no left ventricular hypertrophy. Left ventricular diastolic parameters were normal. Right Ventricle: The right ventricular size is normal. No increase in right  ventricular wall thickness. Right ventricular systolic function is low normal. Tricuspid regurgitation signal is inadequate for assessing PA pressure. Left Atrium: Left atrial size was normal in size. Right Atrium: Right atrial size was normal in size. Pericardium: Trivial pericardial effusion is present. The pericardial effusion is posterior to the left ventricle. Mitral Valve: The mitral valve is grossly normal. Mild mitral valve regurgitation. Tricuspid Valve: The tricuspid valve is grossly normal. Tricuspid valve regurgitation is mild. Aortic Valve: The aortic valve is tricuspid. Aortic valve regurgitation is not visualized.  Pulmonic Valve: The pulmonic valve was grossly normal. Pulmonic valve regurgitation is trivial. Aorta: The aortic root is normal in size and structure. Venous: The inferior vena cava is normal in size with greater than 50% respiratory variability, suggesting right atrial pressure of 3 mmHg. IAS/Shunts: No atrial level shunt detected by color flow Doppler. Rozann Lesches MD Electronically signed by Rozann Lesches MD Signature Date/Time: 03/19/2020/11:01:10 AM    Final     Orson Eva, DO  [facility]  If 7PM-7AM, please contact night-coverage [email or web address] Password [email or web address] 03/21/2020, 4:04 PM   LOS: 6 days

## 2020-03-21 NOTE — Progress Notes (Signed)
ANTICOAGULATION CONSULT NOTE  Pharmacy Consult for Heparin Indication: portal vein thrombosis  Allergies  Allergen Reactions  . Lisinopril Swelling    Patient Measurements: Height: 5\' 6"  (167.6 cm) Weight: 61.2 kg (135 lb) IBW/kg (Calculated) : 63.8 Heparin Dosing Weight: 61.2 kg  Vital Signs: Temp: 99.3 F (37.4 C) (05/09 0519) Temp Source: Oral (05/09 0519) BP: 147/83 (05/09 0519) Pulse Rate: 75 (05/09 0519)  Labs: Recent Labs    03/19/20 0508 03/19/20 0845 03/20/20 0242 03/20/20 1200 03/21/20 0607  HGB 7.5*   < > 7.0*  --  9.5*  HCT 23.8*  --  22.0*  --  29.5*  PLT 103*  --  143*  --  156  HEPARINUNFRC  --    < > 0.38 0.41 0.31  CREATININE 1.28*  --  1.24  --  1.14   < > = values in this interval not displayed.    Estimated Creatinine Clearance: 52.9 mL/min (by C-G formula based on SCr of 1.14 mg/dL).  Assessment: 69 yr old man with portal vein thrombosis; pharmacy was consulted to start heparin therapy.  Pt has hx of GI bleed; Hematology/Oncology okay with starting heparin, as head CT is negative for bleed. Pt was no on anticoagulation PTA.    Heparin level 0.31 - therapeutic hgb 9.5 , platelets 156 (improved).  Goal of Therapy:  Heparin level 0.3-0.7 units/ml (target lower end of goal) Monitor platelets by anticoagulation protocol: Yes   Plan:  Continue heparin infusion rate at 1350 units/hr  Monitor daily HL and CBC daily Monitor for bleeding complications Follow-up transition to oral anticoagulation  Margot Ables, PharmD Clinical Pharmacist 03/21/2020 8:24 AM

## 2020-03-22 ENCOUNTER — Inpatient Hospital Stay (HOSPITAL_COMMUNITY): Payer: Medicare Other

## 2020-03-22 LAB — CBC
HCT: 30.3 % — ABNORMAL LOW (ref 39.0–52.0)
Hemoglobin: 9.8 g/dL — ABNORMAL LOW (ref 13.0–17.0)
MCH: 29.2 pg (ref 26.0–34.0)
MCHC: 32.3 g/dL (ref 30.0–36.0)
MCV: 90.2 fL (ref 80.0–100.0)
Platelets: 171 10*3/uL (ref 150–400)
RBC: 3.36 MIL/uL — ABNORMAL LOW (ref 4.22–5.81)
RDW: 16.5 % — ABNORMAL HIGH (ref 11.5–15.5)
WBC: 12.8 10*3/uL — ABNORMAL HIGH (ref 4.0–10.5)
nRBC: 0 % (ref 0.0–0.2)

## 2020-03-22 LAB — ALBUMIN: Albumin: 1.9 g/dL — ABNORMAL LOW (ref 3.5–5.0)

## 2020-03-22 LAB — URINE CULTURE: Culture: NO GROWTH

## 2020-03-22 LAB — METHYLMALONIC ACID, SERUM: Methylmalonic Acid, Quantitative: 66 nmol/L (ref 0–378)

## 2020-03-22 LAB — HEPARIN LEVEL (UNFRACTIONATED): Heparin Unfractionated: 0.36 IU/mL (ref 0.30–0.70)

## 2020-03-22 MED ORDER — APIXABAN 5 MG PO TABS: 5.0000 mg | ORAL_TABLET | Freq: Two times a day (BID) | ORAL | Status: DC

## 2020-03-22 MED ORDER — APIXABAN 5 MG PO TABS
10.0000 mg | ORAL_TABLET | Freq: Two times a day (BID) | ORAL | Status: DC
  Administered 2020-03-22 – 2020-03-23 (×2): 10 mg via ORAL
  Filled 2020-03-22 (×2): qty 2

## 2020-03-22 MED ORDER — IOHEXOL 300 MG/ML  SOLN
100.0000 mL | Freq: Once | INTRAMUSCULAR | Status: AC | PRN
  Administered 2020-03-22: 100 mL via INTRAVENOUS

## 2020-03-22 MED ORDER — FUROSEMIDE 10 MG/ML IJ SOLN
40.0000 mg | Freq: Once | INTRAMUSCULAR | Status: AC
  Administered 2020-03-22: 18:00:00 40 mg via INTRAVENOUS
  Filled 2020-03-22: qty 4

## 2020-03-22 MED ORDER — ALBUMIN HUMAN 25 % IV SOLN
25.0000 g | Freq: Once | INTRAVENOUS | Status: AC
  Administered 2020-03-22: 25 g via INTRAVENOUS
  Filled 2020-03-22: qty 100

## 2020-03-22 MED ORDER — POTASSIUM CHLORIDE CRYS ER 20 MEQ PO TBCR: 20.0000 meq | EXTENDED_RELEASE_TABLET | Freq: Once | ORAL | Status: DC

## 2020-03-22 NOTE — Progress Notes (Signed)
[facility] for Heparin--> apixaban Indication: portal vein thrombosis  Allergies  Allergen Reactions  . Lisinopril Swelling    Patient Measurements: Height: 5\' 6"  (167.6 cm) Weight: 61.2 kg (135 lb) IBW/kg (Calculated) : 63.8 Heparin Dosing Weight: 61.2 kg  Vital Signs: Temp: 98.5 F (36.9 C) (05/10 1337) Temp Source: Oral (05/10 0534) BP: 141/84 (05/10 1337) Pulse Rate: 79 (05/10 1337)  Labs: Recent Labs    03/20/20 0242 03/20/20 0242 03/20/20 1200 03/21/20 0607 03/22/20 0514  HGB 7.0*   < >  --  9.5* 9.8*  HCT 22.0*  --   --  29.5* 30.3*  PLT 143*  --   --  156 171  HEPARINUNFRC 0.38   < > 0.41 0.31 0.36  CREATININE 1.24  --   --  1.14  --    < > = values in this interval not displayed.    Estimated Creatinine Clearance: 52.9 mL/min (by C-G formula based on SCr of 1.14 mg/dL).  Assessment: 69 yr old man with portal vein thrombosis; pharmacy was consulted to start heparin therapy.  Pt has hx of GI bleed; Hematology/Oncology okay with starting heparin, as head CT is negative for bleed. Pt was not on anticoagulation PTA.   Transition heparin to po apixaban now  Goal of Therapy:  Heparin level 0.3-0.7 units/ml (target lower end of goal) Monitor platelets by anticoagulation protocol: Yes   Plan:  D/C heparin  Start eliquis 10mg  po bid for 7 days, then 5mg  po BID Monitor for bleeding complications Educate on eliquis  Isac Sarna, BS Vena Austria, BCPS Clinical Pharmacist Pager [phone] 03/22/2020 4:10 PM

## 2020-03-22 NOTE — Progress Notes (Signed)
ANTICOAGULATION CONSULT NOTE  Pharmacy Consult for Heparin Indication: portal vein thrombosis  Allergies  Allergen Reactions  . Lisinopril Swelling    Patient Measurements: Height: 5\' 6"  (167.6 cm) Weight: 61.2 kg (135 lb) IBW/kg (Calculated) : 63.8 Heparin Dosing Weight: 61.2 kg  Vital Signs: Temp: 98.9 F (37.2 C) (05/10 0534) Temp Source: Oral (05/10 0534) BP: 148/81 (05/10 0534) Pulse Rate: 76 (05/10 0534)  Labs: Recent Labs    03/20/20 0242 03/20/20 0242 03/20/20 1200 03/21/20 0607 03/22/20 0514  HGB 7.0*   < >  --  9.5* 9.8*  HCT 22.0*  --   --  29.5* 30.3*  PLT 143*  --   --  156 171  HEPARINUNFRC 0.38   < > 0.41 0.31 0.36  CREATININE 1.24  --   --  1.14  --    < > = values in this interval not displayed.    Estimated Creatinine Clearance: 52.9 mL/min (by C-G formula based on SCr of 1.14 mg/dL).  Assessment: 69 yr old man with portal vein thrombosis; pharmacy was consulted to start heparin therapy.  Pt has hx of GI bleed; Hematology/Oncology okay with starting heparin, as head CT is negative for bleed. Pt was no on anticoagulation PTA.    Heparin level 0.36 - therapeutic hgb 9.8 , platelets 171 (improved).  Goal of Therapy:  Heparin level 0.3-0.7 units/ml (target lower end of goal) Monitor platelets by anticoagulation protocol: Yes   Plan:  Continue heparin infusion rate at 1350 units/hr  Monitor daily HL and CBC daily Monitor for bleeding complications Follow-up transition to oral anticoagulation  Margot Ables, PharmD Clinical Pharmacist 03/22/2020 7:38 AM

## 2020-03-22 NOTE — TOC Progression Note (Signed)
Transition of Care [facility]) - Progression Note    Patient Details  Name: Clifford [name] MRN: [id number] Date of Birth: 09-Apr-1951  Transition of Care [facility]) CM/SW Hart, LCSW Phone Number: 03/22/2020, 5:51 PM  Clinical Narrative: TOC received consult for HH/DME needs as patient is declining SNF placement. CSW spoke with patient's wife, Spenser Moczygemba, per patient's request to discuss DME as patient is already active with [facility] for [facility]. Per wife, patient has the DME he needs at home such as a shower chair and walker and they do not anticipate needing additional DME at this time.  Readmission Risk Interventions Readmission Risk Prevention Plan 04/20/2019  Transportation Screening Complete  PCP or Specialist Appt within 5-7 Days Complete  Home Care Screening Complete  Medication Review (RN CM) Complete  Some recent data might be hidden

## 2020-03-22 NOTE — Progress Notes (Signed)
Discussed case with Dr. Laural Golden.  After reviewing CT abd, Dr. Laural Golden requested IV lasix to be ordered  DTat

## 2020-03-22 NOTE — Progress Notes (Signed)
CT images reviewed and results reviewed with Dr. Shanon Brow Tat. Bilateral pleural effusions more since the last study. Small amount of ascites and abdominal wall edema which is new. 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 Thrombus in portal vein and SMV appears to have decreased in size Fatty liver.  Suspect fluid third spacing is primarily due to hypoalbuminemia and not necessarily due to pancreatitis. Discussed with Dr. Carles Collet patient will get IV albumin followed by Lasix today and tomorrow. We will check labs in a.m.

## 2020-03-22 NOTE — Care Management Important Message (Signed)
Important Message  Patient Details  Name: Clifford [name] MRN: [id number] Date of Birth: [birth year]   Medicare Important Message Given:  Yes     [name] 03/22/2020, 4:07 PM

## 2020-03-22 NOTE — Discharge Summary (Signed)
Physician Discharge Summary  Clifford [name] [id number] DOB: Mar 15, 1951 DOA: 03/15/2020  PCP: Clifford Faster, FNP  Admit date: 03/15/2020 Discharge date: 03/23/20  Admitted From: Home Disposition:  Home--refuses SNF  Recommendations for Outpatient Follow-up:  1. Follow up with PCP in 1-2 weeks 2. Please obtain BMP/CBC in one week    Home Health:  yes Equipment/Devices: HHPT, RN, SW, Aide Discharge Condition: Stable  CODE STATUS: FULL Diet recommendation: Heart Healthy  Brief/Interim Summary: 69 year old male with a history of prostate cancer, alcohol abuse, thrombocytopenia, hypertension, hyperlipidemia, GI bleed presenting with generalized weakness and diarrhea. He denied any fevers, chills but has some dyspnea on exertion. There is no nausea or vomiting. There is no hematochezia or melena. The patient normally drinks 1 pint of gin on a daily basis with the last drink on 03/11/2020. His wife had stated that the patient had been confused over the last several days prior to admission. He has had poor oral intake. CT of the abdomen and pelvis showed thrombus in the SMV and portal vein as well as splenic vein. Hematology was consulted. The patient was started on IV heparin. There was subtle peripancreatic edema with dilatation of the pancreatic duct in the tail with a hypoenhancing lesion. There was concern for neoplasm versus pseudocyst versus pancreatitis. There was bilateral lower lobe consolidation.Blood cultures showed Klebsiella pneumoniae. The patient was continued on ceftriaxone 2 grams daily. GI was consulted to assist with his acute on chronic pancreatitis.  He was placed on bowel rest and started on IV fluids.  He gradually improved and his diet was gradually advanced.  03/22/20--repeat CT--Decreased size of small pseudocysts in pancreatic tail.  Stable splenic vein thrombosis, with nonocclusive thrombus again seen in the superior mesenteric vein and main portal  vein.  New mild ascites and body wall edema.   Increased bilateral pleural effusions.  GI cleared him for d/c  Discharge Diagnoses:  Sepsis -Present at the time of admission -Secondary to bacteremiaandpneumonia -Lactic acid peaked 1.8 -PCT 4.78 -lactic 1.8 -Continue ceftriaxone2 grams daily -sepsis physiology resolved  Klebsiella bacteremia -Continue ceftriaxone 2 g daily pending final culture data -Source is Pneumonia -recurrent fever likely due to pancreatitis -5/6 repeat blood culture--neg -2 more days of cipro after d/c  Lobar pneumonia -Continue ceftriaxone  -Stable on room air  Portal vein and SMV thrombosis -Continue IV heparin>>>apixaban -transition to po NOAC if no further invasive procedures planned -Appreciate hematology consult -Monitor platelet counts-->improving -Splenic vein may also be occluded -obtain echo--EF 55-60%, no WMA, mild MR/TR, no thrombi  Diarrhea -C. difficile negative -Stool pathogen panel--neg -Suspect this is likely due to pancreatic insufficiency -Creonand loperamidestarted by GI  Acute on chronic renal failure--CKD stage IIIa -Secondary to sepsis and volume depletion -Baseline creatinine 1.0-1.3 -Presented with serum creatinine 2.44 -Continue IV fluids-->improved  Acute on chronic pancreatitis -tolerating clears>>full liquid>>>heart healthy -diet advancement per GI -check lipid panel--triglycerides 117 -lipase peaked 1236 -continue IVF>>>saline lock -Creon added by GI -5/10--repeat CT--Decreased size of small pseudocysts in pancreatic tail.  Stable splenic vein thrombosis, with nonocclusive thrombus again seen in the superior mesenteric vein and main portal vein.  New mild ascites and body wall edema.   Increased bilateral pleural effusions  Pleural effusions/ascites -due to hypoalbuminemia/pancreatitis and suspected liver cirrhosis -received IV lasix during hospitalization -d/c home with lasix po and  spironolactone -stable on RA  Thrombocytopenia -Multifactorial including myelosuppression from his alcohol with acute worsening secondary to sepsisand acute clot -improving with tx of infection -HIT panel neg -am  CBC  Pancreatic tail hypodensity/mass -Appreciate GI consult--suspect the changes are likely due to chronic pancreatitis with small pseudocysts although cystic pancreatic neoplasm remains in the differential diagnosis  Deconditioning -PT eval-->SNF  Acute on chronic anemia -transfused2 units PRBC 5/8 -FOBT -suspect dilution  Alcohol abuse -Alcohol withdrawal protocol -no signs of withdrawal  Hypokalemia -replete  Hypomagnesemia -replete-->mag remains 1.2 -give IV Mag  GOC  -full code, full scope of care    Discharge Instructions   Allergies as of 03/23/2020      Reactions   Lisinopril Swelling      Medication List    STOP taking these medications   Magnesium Oxide 400 MG Caps Replaced by: magnesium oxide 400 (241.3 Mg) MG tablet   multivitamin with minerals Tabs tablet     TAKE these medications   apixaban 5 MG Tabs tablet Commonly known as: ELIQUIS Take 2 tablets (10 mg total) by mouth 2 (two) times daily. Then 1 tablet (5mg ) two times daily starting 03/29/20 Start taking on: Mar 29, 2020   ciprofloxacin 500 MG tablet Commonly known as: CIPRO Take 1 tablet (500 mg total) by mouth 2 (two) times daily.   folic acid 1 MG tablet Commonly known as: FOLVITE Take 1 tablet (1 mg total) by mouth daily.   furosemide 40 MG tablet Commonly known as: LASIX Take 1 tablet (40 mg total) by mouth daily.   lipase/protease/amylase 36000 UNITS Cpep capsule Commonly known as: CREON Take 2 capsules (72,000 Units total) by mouth 3 (three) times daily with meals.   magnesium oxide 400 (241.3 Mg) MG tablet Commonly known as: MAG-OX Take 1 tablet (400 mg total) by mouth 2 (two) times daily. Replaces: Magnesium Oxide 400 MG Caps   pantoprazole  40 MG tablet Commonly known as: PROTONIX Please take 40mg  (1 tablet) twice daily for 30 days and then 1 tablet daily thereafter. What changed:   how much to take  how to take this  when to take this   spironolactone 25 MG tablet Commonly known as: ALDACTONE Take 0.5 tablets (12.5 mg total) by mouth daily.   thiamine 100 MG tablet Take 1 tablet (100 mg total) by mouth daily.       Allergies  Allergen Reactions  . Lisinopril Swelling    Consultations:  GI--Rehman; palliative med   Procedures/Studies: DG Chest 2 View  Result Date: 03/15/2020 CLINICAL DATA:  Rales EXAM: CHEST - 2 VIEW COMPARISON:  03/04/2020 FINDINGS: Heart is normal size. Left lower lobe atelectasis or infiltrate noted. No confluent opacity on the right. No effusions or acute bony abnormality. IMPRESSION: Left basilar atelectasis or infiltrate. Electronically Signed   By: Rolm Baptise M.D.   On: 03/15/2020 21:40   CT HEAD WO CONTRAST  Result Date: 03/15/2020 CLINICAL DATA:  Encephalopathy EXAM: CT HEAD WITHOUT CONTRAST TECHNIQUE: Contiguous axial images were obtained from the base of the skull through the vertex without intravenous contrast. COMPARISON:  None. FINDINGS: Brain: There is atrophy and chronic small vessel disease changes. No acute intracranial abnormality. Specifically, no hemorrhage, hydrocephalus, mass lesion, acute infarction, or significant intracranial injury. Vascular: No hyperdense vessel or unexpected calcification. Skull: No acute calvarial abnormality. Sinuses/Orbits: Visualized paranasal sinuses and mastoids clear. Orbital soft tissues unremarkable. Other: None IMPRESSION: Atrophy, chronic microvascular disease. No acute intracranial abnormality. Electronically Signed   By: Rolm Baptise M.D.   On: 03/15/2020 21:39   CT ABDOMEN PELVIS W CONTRAST  Result Date: 03/22/2020 CLINICAL DATA:  Recurrent pancreatitis. Alcohol misuse. Diarrhea. Weakness. EXAM: CT ABDOMEN  AND PELVIS WITH CONTRAST  TECHNIQUE: Multidetector CT imaging of the abdomen and pelvis was performed using the standard protocol following bolus administration of intravenous contrast. CONTRAST:  170mL OMNIPAQUE IOHEXOL 300 MG/ML  SOLN COMPARISON:  03/15/2020 FINDINGS: Lower Chest: Increase size of small bilateral pleural effusions and bibasilar atelectasis. Hepatobiliary: No hepatic masses identified. Moderate to severe diffuse hepatic steatosis again seen. Gallbladder is unremarkable. No evidence of biliary ductal dilatation. Pancreas: Mild acute pancreatitis shows no significant change compared to previous study. Mild pancreatic ductal dilatation is seen. A few small cystic areas are seen in the pancreatic tail, largest measuring 1.8 cm in maximum diameter compared to 2.3 cm previously. These are consistent with small pseudocysts. No new or enlarging fluid collections seen. Spleen: Within normal limits in size and appearance. Adrenals/Urinary Tract: No masses identified. No evidence of ureteral calculi or hydronephrosis. Stomach/Bowel: No evidence of obstruction, bowel wall thickening, or other inflammatory process. Vascular/Lymphatic: No pathologically enlarged lymph nodes. Splenic vein thrombosis is again seen. Nonocclusive thrombus is again seen within the superior mesenteric vein and main portal vein. No abdominal aortic aneurysm. Reproductive:  No mass or other significant abnormality. Other: Mild ascites is new since previous study. Increased body wall edema also noted. Musculoskeletal:  No suspicious bone lesions identified. IMPRESSION: 1. No significant change in mild acute pancreatitis. 2. Decreased size of small pseudocysts in pancreatic tail. 3. Stable splenic vein thrombosis, with nonocclusive thrombus again seen in the superior mesenteric vein and main portal vein. 4. New mild ascites and body wall edema. 5. Increased bilateral pleural effusions and bibasilar atelectasis. 6. Stable hepatic steatosis. Electronically Signed    By: Marlaine Hind M.D.   On: 03/22/2020 17:10   CT ABDOMEN PELVIS W CONTRAST  Result Date: 03/15/2020 CLINICAL DATA:  Diarrhea. Abdominal tenderness. Elevated lipase. History of prostate cancer. EXAM: CT ABDOMEN AND PELVIS WITH CONTRAST TECHNIQUE: Multidetector CT imaging of the abdomen and pelvis was performed using the standard protocol following bolus administration of intravenous contrast. CONTRAST:  70mL OMNIPAQUE IOHEXOL 300 MG/ML  SOLN COMPARISON:  None. FINDINGS: Lower chest: Collapse/consolidative changes noted in the lower lobes bilaterally, left greater than right. No substantial pleural effusion. Hepatobiliary: The liver shows diffusely decreased attenuation suggesting fat deposition. No suspicious focal abnormality within the liver parenchyma. There is no evidence for gallstones, gallbladder wall thickening, or pericholecystic fluid. No intrahepatic or extrahepatic biliary dilation. Pancreas: Subtle peripancreatic edema evident with either irregular substantial dilatation of the pancreatic duct in the tail of pancreas versus cystic pancreatic lesion measuring on the order of 3.1 x 1.8 cm. Spleen: Edema noted in the splenic hilum. Spleen otherwise unremarkable. Adrenals/Urinary Tract: No adrenal nodule or mass. Right kidney unremarkable. 10 mm hypoattenuating lesion noted lower pole left kidney with attenuation too high to be a simple cyst. No evidence for hydroureter. The urinary bladder appears normal for the degree of distention. Stomach/Bowel: Stomach is nondistended. 2.2 x 1.4 cm soft tissue lesion or fluid collection involving the posterior wall of the gastric fundus appears to communicate with the pancreatic tail lesion. Duodenum is normally positioned as is the ligament of Treitz. No small bowel wall thickening. No small bowel dilatation. The terminal ileum is normal. The appendix is best seen on coronal images and is unremarkable. No gross colonic mass. No colonic wall thickening.  Vascular/Lymphatic: No abdominal aortic aneurysm. No abdominal aortic atherosclerotic calcification. There is no gastrohepatic or hepatoduodenal ligament lymphadenopathy. No retroperitoneal or mesenteric lymphadenopathy. Thrombus is identified in the portal splenic confluence, involving the superior  mesenteric vein and portal vein. Splenic vein may well be occluded. Reproductive: Fiducial markers noted in the prostate gland. Other: No intraperitoneal free fluid. Musculoskeletal: No worrisome lytic or sclerotic osseous abnormality. IMPRESSION: 1. Thrombus is identified in the portosplenic confluence, involving the superior mesenteric vein and portal vein. Splenic vein may well be occluded. 2. Subtle peripancreatic edema with either irregular substantial dilatation of the pancreatic duct in the tail of pancreas versus ill-defined cystic or hypoenhancing pancreatic lesion measuring on the order of 3.1 x 1.8 cm. Imaging features raise concern for pancreatic neoplasm although this could simply represent pancreatitis with parenchymal pseudocyst. Abdominal MRI without and with contrast may prove helpful to further evaluate. 3. 2.2 x 1.4 cm soft tissue lesion or fluid collection involving the posterior wall of the gastric fundus appears to communicate with the pancreatic tail lesion. 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 4. Collapse/consolidative changes in the lower lobes bilaterally, left greater than right. 5. Hepatic steatosis. 6. 10 mm hypoattenuating lesion lower pole left kidney with attenuation too high to be a simple cyst. This may be a cyst complicated by proteinaceous debris or hemorrhage, but neoplasm cannot be excluded. This could also be further evaluated the time of follow-up MRI. Electronically Signed   By: Misty Stanley M.D.   On: 03/15/2020 17:04   DG Chest Port 1 View  Result Date: 03/04/2020 CLINICAL DATA:  Fever, generalized weakness. EXAM: PORTABLE CHEST 1 VIEW COMPARISON:  Chest x-rays dated  02/07/2020 and 08/13/2018. FINDINGS: Heart size and mediastinal contours are stable. Lungs are clear. No pleural effusion is seen. Osseous structures about the chest are unremarkable. IMPRESSION: No active disease. No evidence of pneumonia or pulmonary edema. Electronically Signed   By: Franki Cabot M.D.   On: 03/04/2020 14:54   ECHOCARDIOGRAM COMPLETE  Result Date: 03/19/2020    ECHOCARDIOGRAM REPORT   Patient Name:   Clifford [name] Date of Exam: 03/19/2020 Medical Rec #:  [id number]         Height:       66.0 in Accession #:    [id number]        Weight:       135.0 lb Date of Birth:  1950/12/20          BSA:          1.692 m Patient Age:    69 years          BP:           141/81 mmHg Patient Gender: M                 HR:           85 bpm. Exam Location:  [facility] Procedure: 2D Echo Indications:    Mesenteric thrombosis ([place]) [id number]  History:        Patient has no prior history of Echocardiogram examinations.                 Risk Factors:Hypertension, Dyslipidemia and Non-Smoker.                 Alcoholism, Sepsis.  Sonographer:    Leavy Cella RDCS (AE) Referring Phys: [phone] Miquel Lamson IMPRESSIONS  1. Left ventricular ejection fraction, by estimation, is 55 to 60%. The left ventricle has normal function. The left ventricle has no regional wall motion abnormalities. Left ventricular diastolic parameters were normal.  2. Right ventricular systolic function is low normal. The right ventricular size is normal. Tricuspid regurgitation signal is inadequate for assessing  PA pressure.  3. The mitral valve is grossly normal. Mild mitral valve regurgitation.  4. The aortic valve is tricuspid. Aortic valve regurgitation is not visualized.  5. The inferior vena cava is normal in size with greater than 50% respiratory variability, suggesting right atrial pressure of 3 mmHg. FINDINGS  Left Ventricle: Left ventricular ejection fraction, by estimation, is 55 to 60%. The left ventricle has normal function. The left  ventricle has no regional wall motion abnormalities. The left ventricular internal cavity size was normal in size. There is  no left ventricular hypertrophy. Left ventricular diastolic parameters were normal. Right Ventricle: The right ventricular size is normal. No increase in right ventricular wall thickness. Right ventricular systolic function is low normal. Tricuspid regurgitation signal is inadequate for assessing PA pressure. Left Atrium: Left atrial size was normal in size. Right Atrium: Right atrial size was normal in size. Pericardium: Trivial pericardial effusion is present. The pericardial effusion is posterior to the left ventricle. Mitral Valve: The mitral valve is grossly normal. Mild mitral valve regurgitation. Tricuspid Valve: The tricuspid valve is grossly normal. Tricuspid valve regurgitation is mild. Aortic Valve: The aortic valve is tricuspid. Aortic valve regurgitation is not visualized. Pulmonic Valve: The pulmonic valve was grossly normal. Pulmonic valve regurgitation is trivial. Aorta: The aortic root is normal in size and structure. Venous: The inferior vena cava is normal in size with greater than 50% respiratory variability, suggesting right atrial pressure of 3 mmHg. IAS/Shunts: No atrial level shunt detected by color flow Doppler. Rozann Lesches MD Electronically signed by Rozann Lesches MD Signature Date/Time: 03/19/2020/11:01:10 AM    Final         Discharge Exam: Vitals:   03/22/20 1948 03/23/20 0449  BP: 137/79 (!) 155/92  Pulse: 78 80  Resp: 18 20  Temp: 99 F (37.2 C) 98.5 F (36.9 C)  SpO2: 98% 100%   Vitals:   03/22/20 1337 03/22/20 1438 03/22/20 1948 03/23/20 0449  BP: (!) 141/84  137/79 (!) 155/92  Pulse: 79  78 80  Resp: 18  18 20   Temp: 98.5 F (36.9 C)  99 F (37.2 C) 98.5 F (36.9 C)  TempSrc:   Oral Oral  SpO2: 97% 98% 98% 100%  Weight:      Height:        General: Pt is alert, awake, not in acute distress Cardiovascular: RRR, S1/S2 +, no  rubs, no gallops Respiratory: bibasilar rales. No wheeze Abdominal: Soft, NT, ND, bowel sounds + Extremities: trace LE edema, no cyanosis   The results of significant diagnostics from this hospitalization (including imaging, microbiology, ancillary and laboratory) are listed below for reference.    Significant Diagnostic Studies: DG Chest 2 View  Result Date: 03/15/2020 CLINICAL DATA:  Rales EXAM: CHEST - 2 VIEW COMPARISON:  03/04/2020 FINDINGS: Heart is normal size. Left lower lobe atelectasis or infiltrate noted. No confluent opacity on the right. No effusions or acute bony abnormality. IMPRESSION: Left basilar atelectasis or infiltrate. Electronically Signed   By: Rolm Baptise M.D.   On: 03/15/2020 21:40   CT HEAD WO CONTRAST  Result Date: 03/15/2020 CLINICAL DATA:  Encephalopathy EXAM: CT HEAD WITHOUT CONTRAST TECHNIQUE: Contiguous axial images were obtained from the base of the skull through the vertex without intravenous contrast. COMPARISON:  None. FINDINGS: Brain: There is atrophy and chronic small vessel disease changes. No acute intracranial abnormality. Specifically, no hemorrhage, hydrocephalus, mass lesion, acute infarction, or significant intracranial injury. Vascular: No hyperdense vessel or unexpected calcification. Skull: No acute  calvarial abnormality. Sinuses/Orbits: Visualized paranasal sinuses and mastoids clear. Orbital soft tissues unremarkable. Other: None IMPRESSION: Atrophy, chronic microvascular disease. No acute intracranial abnormality. Electronically Signed   By: Rolm Baptise M.D.   On: 03/15/2020 21:39   CT ABDOMEN PELVIS W CONTRAST  Result Date: 03/22/2020 CLINICAL DATA:  Recurrent pancreatitis. Alcohol misuse. Diarrhea. Weakness. EXAM: CT ABDOMEN AND PELVIS WITH CONTRAST TECHNIQUE: Multidetector CT imaging of the abdomen and pelvis was performed using the standard protocol following bolus administration of intravenous contrast. CONTRAST:  181mL OMNIPAQUE IOHEXOL 300  MG/ML  SOLN COMPARISON:  03/15/2020 FINDINGS: Lower Chest: Increase size of small bilateral pleural effusions and bibasilar atelectasis. Hepatobiliary: No hepatic masses identified. Moderate to severe diffuse hepatic steatosis again seen. Gallbladder is unremarkable. No evidence of biliary ductal dilatation. Pancreas: Mild acute pancreatitis shows no significant change compared to previous study. Mild pancreatic ductal dilatation is seen. A few small cystic areas are seen in the pancreatic tail, largest measuring 1.8 cm in maximum diameter compared to 2.3 cm previously. These are consistent with small pseudocysts. No new or enlarging fluid collections seen. Spleen: Within normal limits in size and appearance. Adrenals/Urinary Tract: No masses identified. No evidence of ureteral calculi or hydronephrosis. Stomach/Bowel: No evidence of obstruction, bowel wall thickening, or other inflammatory process. Vascular/Lymphatic: No pathologically enlarged lymph nodes. Splenic vein thrombosis is again seen. Nonocclusive thrombus is again seen within the superior mesenteric vein and main portal vein. No abdominal aortic aneurysm. Reproductive:  No mass or other significant abnormality. Other: Mild ascites is new since previous study. Increased body wall edema also noted. Musculoskeletal:  No suspicious bone lesions identified. IMPRESSION: 1. No significant change in mild acute pancreatitis. 2. Decreased size of small pseudocysts in pancreatic tail. 3. Stable splenic vein thrombosis, with nonocclusive thrombus again seen in the superior mesenteric vein and main portal vein. 4. New mild ascites and body wall edema. 5. Increased bilateral pleural effusions and bibasilar atelectasis. 6. Stable hepatic steatosis. Electronically Signed   By: Marlaine Hind M.D.   On: 03/22/2020 17:10   CT ABDOMEN PELVIS W CONTRAST  Result Date: 03/15/2020 CLINICAL DATA:  Diarrhea. Abdominal tenderness. Elevated lipase. History of prostate cancer.  EXAM: CT ABDOMEN AND PELVIS WITH CONTRAST TECHNIQUE: Multidetector CT imaging of the abdomen and pelvis was performed using the standard protocol following bolus administration of intravenous contrast. CONTRAST:  98mL OMNIPAQUE IOHEXOL 300 MG/ML  SOLN COMPARISON:  None. FINDINGS: Lower chest: Collapse/consolidative changes noted in the lower lobes bilaterally, left greater than right. No substantial pleural effusion. Hepatobiliary: The liver shows diffusely decreased attenuation suggesting fat deposition. No suspicious focal abnormality within the liver parenchyma. There is no evidence for gallstones, gallbladder wall thickening, or pericholecystic fluid. No intrahepatic or extrahepatic biliary dilation. Pancreas: Subtle peripancreatic edema evident with either irregular substantial dilatation of the pancreatic duct in the tail of pancreas versus cystic pancreatic lesion measuring on the order of 3.1 x 1.8 cm. Spleen: Edema noted in the splenic hilum. Spleen otherwise unremarkable. Adrenals/Urinary Tract: No adrenal nodule or mass. Right kidney unremarkable. 10 mm hypoattenuating lesion noted lower pole left kidney with attenuation too high to be a simple cyst. No evidence for hydroureter. The urinary bladder appears normal for the degree of distention. Stomach/Bowel: Stomach is nondistended. 2.2 x 1.4 cm soft tissue lesion or fluid collection involving the posterior wall of the gastric fundus appears to communicate with the pancreatic tail lesion. Duodenum is normally positioned as is the ligament of Treitz. No small bowel wall thickening. No small  bowel dilatation. The terminal ileum is normal. The appendix is best seen on coronal images and is unremarkable. No gross colonic mass. No colonic wall thickening. Vascular/Lymphatic: No abdominal aortic aneurysm. No abdominal aortic atherosclerotic calcification. There is no gastrohepatic or hepatoduodenal ligament lymphadenopathy. No retroperitoneal or mesenteric  lymphadenopathy. 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 Splenic vein may well be occluded. Reproductive: Fiducial markers noted in the prostate gland. Other: No intraperitoneal free fluid. Musculoskeletal: No worrisome lytic or sclerotic osseous abnormality. IMPRESSION: 1. Thrombus is identified in the portosplenic confluence, involving the superior mesenteric vein and portal vein. Splenic vein may well be occluded. 2. Subtle peripancreatic edema with either irregular substantial dilatation of the pancreatic duct in the tail of pancreas versus ill-defined cystic or hypoenhancing pancreatic lesion measuring on the order of 3.1 x 1.8 cm. Imaging features raise concern for pancreatic neoplasm although this could simply represent pancreatitis with parenchymal pseudocyst. Abdominal MRI without and with contrast may prove helpful to further evaluate. 3. 2.2 x 1.4 cm soft tissue lesion or fluid collection involving the posterior wall of the gastric fundus appears to communicate with the pancreatic tail lesion. 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 4. Collapse/consolidative changes in the lower lobes bilaterally, left greater than right. 5. Hepatic steatosis. 6. 10 mm hypoattenuating lesion lower pole left kidney with attenuation too high to be a simple cyst. This may be a cyst complicated by proteinaceous debris or hemorrhage, but neoplasm cannot be excluded. This could also be further evaluated the time of follow-up MRI. Electronically Signed   By: Misty Stanley M.D.   On: 03/15/2020 17:04   DG Chest Port 1 View  Result Date: 03/04/2020 CLINICAL DATA:  Fever, generalized weakness. EXAM: PORTABLE CHEST 1 VIEW COMPARISON:  Chest x-rays dated 02/07/2020 and 08/13/2018. FINDINGS: Heart size and mediastinal contours are stable. Lungs are clear. No pleural effusion is seen. Osseous structures about the chest are unremarkable. IMPRESSION: No active  disease. No evidence of pneumonia or pulmonary edema. Electronically Signed   By: Franki Cabot M.D.   On: 03/04/2020 14:54   ECHOCARDIOGRAM COMPLETE  Result Date: 03/19/2020    ECHOCARDIOGRAM REPORT   Patient Name:   CRAIGE RZEPKA Date of Exam: 03/19/2020 Medical Rec #:  [id number]         Height:       66.0 in Accession #:    [id number]        Weight:       135.0 lb Date of Birth:  10-02-1951          BSA:          1.692 m Patient Age:    39 years          BP:           141/81 mmHg Patient Gender: M                 HR:           85 bpm. 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                 Risk Factors:Hypertension, Dyslipidemia and Non-Smoker.                 Alcoholism, Sepsis.  Sonographer:    Leavy Cella RDCS (AE)  Referring Phys: [id number] Amylah Will IMPRESSIONS  1. Left ventricular ejection fraction, by estimation, is 55 to 60%. The left ventricle has normal function. The left ventricle has no regional wall motion abnormalities. Left ventricular diastolic parameters were normal.  2. Right ventricular systolic function is low normal. The right ventricular size is normal. Tricuspid regurgitation signal is inadequate for assessing PA pressure.  3. The mitral valve is grossly normal. Mild mitral valve regurgitation.  4. The aortic valve is tricuspid. Aortic valve regurgitation is not visualized.  5. The inferior vena cava is normal in size with greater than 50% respiratory variability, suggesting right atrial pressure of 3 mmHg. FINDINGS  Left Ventricle: Left ventricular ejection fraction, by estimation, is 55 to 60%. The left ventricle has normal function. The left ventricle has no regional wall motion abnormalities. The left ventricular internal cavity size was normal in size. There is  no left ventricular hypertrophy. Left ventricular diastolic parameters were normal.  Right Ventricle: The right ventricular size is normal. No increase in right ventricular wall thickness. Right ventricular systolic function is low normal. Tricuspid regurgitation signal is inadequate for assessing PA pressure. Left Atrium: Left atrial size was normal in size. Right Atrium: Right atrial size was normal in size. Pericardium: Trivial pericardial effusion is present. The pericardial effusion is posterior to the left ventricle. Mitral Valve: The mitral valve is grossly normal. Mild mitral valve regurgitation. Tricuspid Valve: The tricuspid valve is grossly normal. Tricuspid valve regurgitation is mild. Aortic Valve: The aortic valve is tricuspid. Aortic valve regurgitation is not visualized. Pulmonic Valve: The pulmonic valve was grossly normal. Pulmonic valve regurgitation is trivial. Aorta: The aortic root is normal in size and structure. Venous: The inferior vena cava is normal in size with greater than 50% respiratory variability, suggesting right atrial pressure of 3 mmHg. IAS/Shunts: No atrial level shunt detected by color flow Doppler. Rozann Lesches MD Electronically signed by Rozann Lesches MD Signature Date/Time: 03/19/2020/11:01:10 AM    Final      Microbiology: Recent Results (from the past 240 hour(s))  Respiratory Panel by RT PCR (Flu A&B, Covid) - Nasopharyngeal Swab     Status: None   Collection Time: 03/15/20  3:52 PM   Specimen: Nasopharyngeal Swab  Result Value Ref Range Status   SARS Coronavirus 2 by RT PCR NEGATIVE NEGATIVE Final    Comment: (NOTE) SARS-CoV-2 target nucleic acids are NOT DETECTED. The SARS-CoV-2 RNA is generally detectable in upper respiratoy specimens during the acute phase of infection. The lowest concentration of SARS-CoV-2 viral copies this assay can detect is 131 copies/mL. A negative result does not preclude SARS-Cov-2 infection and should not be used as the sole basis for treatment or other patient management decisions. A negative result may  occur with  improper specimen collection/handling, submission of specimen other than nasopharyngeal swab, presence of viral mutation(s) within the areas targeted by this assay, and inadequate number of viral copies (<131 copies/mL). A negative result must be combined with clinical observations, patient history, and epidemiological information. The expected result is Negative. Fact Sheet for Patients:  PinkCheek.be Fact Sheet for Healthcare Providers:  GravelBags.it This test is not yet ap proved or cleared by the [place] FDA and  has been authorized for detection and/or diagnosis of SARS-CoV-2 by FDA under an Emergency Use Authorization (EUA). This EUA will remain  in effect (meaning this test can be used) for the duration of the COVID-19 declaration under Section 564(b)(1) of the Act, 21 U.S.C. section 360bbb-3(b)(1), unless the authorization  is terminated or revoked sooner.    Influenza A by PCR NEGATIVE NEGATIVE Final   Influenza B by PCR NEGATIVE NEGATIVE Final    Comment: (NOTE) The Xpert Xpress SARS-CoV-2/FLU/RSV assay is intended as an aid in  the diagnosis of influenza from Nasopharyngeal swab specimens and  should not be used as a sole basis for treatment. Nasal washings and  aspirates are unacceptable for Xpert Xpress SARS-CoV-2/FLU/RSV  testing. Fact Sheet for Patients: PinkCheek.be Fact Sheet for Healthcare Providers: GravelBags.it This test is not yet approved or cleared by the [place] FDA and  has been authorized for detection and/or diagnosis of SARS-CoV-2 by  FDA under an Emergency Use Authorization (EUA). This EUA will remain  in effect (meaning this test can be used) for the duration of the  Covid-19 declaration under Section 564(b)(1) of the Act, 21  U.S.C. section 360bbb-3(b)(1), unless the authorization is  terminated or  revoked. Performed at [facility], [address], [place], [place] [address]   Blood culture (routine x 2)     Status: Abnormal   Collection Time: 03/15/20  3:52 PM   Specimen: BLOOD RIGHT WRIST  Result Value Ref Range Status   Specimen Description   Final    BLOOD RIGHT WRIST Performed at [facility], [address], [place], [place] [address]    Special Requests   Final    BOTTLES DRAWN AEROBIC AND ANAEROBIC Blood Culture adequate volume Performed at [facility], [address], [place], [place] [address]    Culture  Setup Time   Final    IN BOTH AEROBIC AND ANAEROBIC BOTTLES GRAM NEGATIVE RODS Gram Stain Report Called to,Read Back By and Verified With: T VILLALOBOS,RN @0433  03/16/20 MKELLY    Culture KLEBSIELLA PNEUMONIAE (A)  Final   Report Status 03/18/2020 FINAL  Final   Organism ID, Bacteria KLEBSIELLA PNEUMONIAE  Final      Susceptibility   Klebsiella pneumoniae - MIC*    AMPICILLIN >=32 RESISTANT Resistant     CEFAZOLIN 16 SENSITIVE Sensitive     CEFEPIME <=1 SENSITIVE Sensitive     CEFTAZIDIME <=1 SENSITIVE Sensitive     CEFTRIAXONE <=1 SENSITIVE Sensitive     CIPROFLOXACIN <=0.25 SENSITIVE Sensitive     GENTAMICIN <=1 SENSITIVE Sensitive     IMIPENEM <=0.25 SENSITIVE Sensitive     TRIMETH/SULFA <=20 SENSITIVE Sensitive     AMPICILLIN/SULBACTAM >=32 RESISTANT Resistant     PIP/TAZO 32 INTERMEDIATE Intermediate     * KLEBSIELLA PNEUMONIAE  Blood Culture ID Panel (Reflexed)     Status: Abnormal   Collection Time: 03/15/20  3:52 PM  Result Value Ref Range Status   Enterococcus species NOT DETECTED NOT DETECTED Final   Listeria monocytogenes NOT DETECTED NOT DETECTED Final   Staphylococcus species NOT DETECTED NOT DETECTED Final   Staphylococcus aureus (BCID) NOT DETECTED NOT DETECTED Final   Streptococcus species NOT DETECTED NOT DETECTED Final   Streptococcus agalactiae NOT DETECTED NOT DETECTED Final   Streptococcus pneumoniae NOT DETECTED NOT DETECTED Final    Streptococcus pyogenes NOT DETECTED NOT DETECTED Final   Acinetobacter baumannii NOT DETECTED NOT DETECTED Final   Enterobacteriaceae species DETECTED (A) NOT DETECTED Final    Comment: Enterobacteriaceae represent a large family of gram-negative bacteria, not a single organism. CRITICAL RESULT CALLED TO, READ BACK BY AND VERIFIED WITH: Deborra Medina PharmD 9:10 03/16/20 (wilsonm)    Enterobacter cloacae complex NOT DETECTED NOT DETECTED Final   Escherichia coli NOT DETECTED NOT DETECTED Final   Klebsiella  oxytoca NOT DETECTED NOT DETECTED Final   Klebsiella pneumoniae DETECTED (A) NOT DETECTED Final    Comment: CRITICAL RESULT CALLED TO, READ BACK BY AND VERIFIED WITH: Deborra Medina PharmD 9:10 03/16/20 ([email or web address])    Proteus species NOT DETECTED NOT DETECTED Final   Serratia marcescens NOT DETECTED NOT DETECTED Final   Carbapenem resistance NOT DETECTED NOT DETECTED Final   Haemophilus influenzae NOT DETECTED NOT DETECTED Final   Neisseria meningitidis NOT DETECTED NOT DETECTED Final   Pseudomonas aeruginosa NOT DETECTED NOT DETECTED Final   Candida albicans NOT DETECTED NOT DETECTED Final   Candida glabrata NOT DETECTED NOT DETECTED Final   Candida krusei NOT DETECTED NOT DETECTED Final   Candida parapsilosis NOT DETECTED NOT DETECTED Final   Candida tropicalis NOT DETECTED NOT DETECTED Final    Comment: Performed at [facility], [place] [address], [place], [place] [address]  Blood culture (routine x 2)     Status: Abnormal   Collection Time: 03/15/20  3:53 PM   Specimen: BLOOD RIGHT HAND  Result Value Ref Range Status   Specimen Description   Final    BLOOD RIGHT HAND BOTTLES DRAWN AEROBIC AND ANAEROBIC Performed at [facility], [address], [place], [place] [address]    Special Requests   Final    Blood Culture adequate volume Performed at [facility], [address], [place], [place] [address]    Culture  Setup Time   Final    IN BOTH AEROBIC AND ANAEROBIC BOTTLES GRAM NEGATIVE  RODS Gram Stain Report Called to,Read Back By and Verified With: T Manson Passey @0432  03/16/20 [facility] Performed at [facility], [address], [place], [place] [address]    Culture (A)  Final    KLEBSIELLA PNEUMONIAE SUSCEPTIBILITIES PERFORMED ON PREVIOUS CULTURE WITHIN THE LAST 5 DAYS. Performed at [facility], [place] [address], [place], [place] [address]    Report Status 03/18/2020 FINAL  Final  C Difficile Quick Screen w PCR reflex     Status: None   Collection Time: 03/16/20  2:37 PM   Specimen: STOOL  Result Value Ref Range Status   C Diff antigen NEGATIVE NEGATIVE Final   C Diff toxin NEGATIVE NEGATIVE Final   C Diff interpretation No C. difficile detected.  Final    Comment: Performed at [facility], [address], [place], [place] [address]  Gastrointestinal Panel by PCR , Stool     Status: None   Collection Time: 03/17/20  5:19 PM   Specimen: Stool  Result Value Ref Range Status   Campylobacter species NOT DETECTED NOT DETECTED Final   Plesimonas shigelloides NOT DETECTED NOT DETECTED Final   Salmonella species NOT DETECTED NOT DETECTED Final   Yersinia enterocolitica NOT DETECTED NOT DETECTED Final   Vibrio species NOT DETECTED NOT DETECTED Final   Vibrio cholerae NOT DETECTED NOT DETECTED Final   Enteroaggregative E coli (EAEC) NOT DETECTED NOT DETECTED Final   Enteropathogenic E coli (EPEC) NOT DETECTED NOT DETECTED Final   Enterotoxigenic E coli (ETEC) NOT DETECTED NOT DETECTED Final   Shiga like toxin producing E coli (STEC) NOT DETECTED NOT DETECTED Final   Shigella/Enteroinvasive E coli (EIEC) NOT DETECTED NOT DETECTED Final   Cryptosporidium NOT DETECTED NOT DETECTED Final   Cyclospora cayetanensis NOT DETECTED NOT DETECTED Final   Entamoeba histolytica NOT DETECTED NOT DETECTED Final   Giardia lamblia NOT DETECTED NOT DETECTED Final   Adenovirus F40/41 NOT DETECTED NOT DETECTED Final   Astrovirus NOT DETECTED NOT DETECTED  Final   Norovirus GI/GII NOT  DETECTED NOT DETECTED Final   Rotavirus A NOT DETECTED NOT DETECTED Final   Sapovirus (I, II, IV, and V) NOT DETECTED NOT DETECTED Final    Comment: Performed at [facility], [address] [place], [place] [address]  Culture, blood (Routine X 2) w Reflex to ID Panel     Status: None   Collection Time: 03/18/20  7:26 AM   Specimen: BLOOD RIGHT HAND  Result Value Ref Range Status   Specimen Description BLOOD RIGHT HAND  Final   Special Requests   Final    BOTTLES DRAWN AEROBIC AND ANAEROBIC Blood Culture results may not be optimal due to an inadequate volume of blood received in culture bottles   Culture   Final    NO GROWTH 5 DAYS Performed at [facility], [address] [place], [place] [address]    Report Status 03/23/2020 FINAL  Final  Culture, blood (Routine X 2) w Reflex to ID Panel     Status: None   Collection Time: 03/18/20  7:26 AM   Specimen: BLOOD LEFT HAND  Result Value Ref Range Status   Specimen Description BLOOD LEFT HAND  Final   Special Requests   Final    BOTTLES DRAWN AEROBIC AND ANAEROBIC Blood Culture adequate volume   Culture   Final    NO GROWTH 5 DAYS Performed at [facility], [address], [place], [place] [address]    Report Status 03/23/2020 FINAL  Final  Urine culture     Status: None   Collection Time: 03/20/20  5:00 PM   Specimen: Urine, Clean Catch  Result Value Ref Range Status   Specimen Description   Final    URINE, CLEAN CATCH Performed at [facility], [address] [place], [place] [address]    Special Requests   Final    NONE Performed at [facility], [address] [place], [place] [address]    Culture   Final    NO GROWTH Performed at [facility], [place] [address] [place], [place] [address]    Report Status 03/22/2020 FINAL  Final     Labs: Basic Metabolic Panel: Recent Labs  Lab 03/18/20 0444 03/18/20 0444 03/19/20 0508 03/19/20 0508 03/20/20 0242 03/20/20 0242 03/21/20 0607 03/23/20 0414  NA  135  --  135  --  132*  --  134* 136  K 3.1*   < > 3.4*   < > 3.4*   < > 3.4* 3.3*  CL 103  --  104  --  104  --  104 101  CO2 19*  --  20*  --  20*  --  18* 22  GLUCOSE 111*  --  116*  --  121*  --  106* 94  BUN 27*  --  19  --  15  --  11 7*  CREATININE 1.45*  --  1.28*  --  1.24  --  1.14 1.10  CALCIUM 7.5*  --  7.3*  --  7.1*  --  7.3* 7.7*  MG  --   --   --   --   --   --  1.2* 1.4*   < > = values in this interval not displayed.   Liver Function Tests: Recent Labs  Lab 03/18/20 0444 03/19/20 0508 03/20/20 0242 03/22/20 0514  AST 48* 38 33  --   ALT 16 15 14   --   ALKPHOS 70 68 66  --  BILITOT 1.6* 1.1 1.1  --   PROT 5.3* 5.1* 4.9*  --   ALBUMIN 2.0* 1.9* 1.8* 1.9*   Recent Labs  Lab 03/17/20 0131 03/17/20 0807 03/18/20 0444 03/19/20 0508 03/20/20 0242 03/23/20 0414  LIPASE 1,236*  --  563* 927* 659* 68*  AMYLASE  --  519* 520*  --   --   --    No results for input(s): AMMONIA in the last 168 hours. CBC: Recent Labs  Lab 03/19/20 0508 03/20/20 0242 03/21/20 0607 03/22/20 0514 03/23/20 0414  WBC 10.4 11.4* 12.6* 12.8* 9.9  HGB 7.5* 7.0* 9.5* 9.8* 9.7*  HCT 23.8* 22.0* 29.5* 30.3* 30.2*  MCV 91.9 93.2 91.0 90.2 91.8  PLT 103* 143* 156 171 154   Cardiac Enzymes: No results for input(s): CKTOTAL, CKMB, CKMBINDEX, TROPONINI in the last 168 hours. BNP: Invalid input(s): POCBNP CBG: No results for input(s): GLUCAP in the last 168 hours.  Time coordinating discharge:  36 minutes  Signed:  Orson Eva, DO [facility] Pager: [phone] 03/23/2020, 12:41 PM

## 2020-03-22 NOTE — Progress Notes (Signed)
Subjective:  Patient has no complaints.  He states he had 2 bowel movements yesterday.  Stools are firming up but not formed.  He denies melena or rectal bleeding.  He says his appetite is getting better.  He ate most of his breakfast this morning.  He denies abdominal pain nausea or vomiting.  Only time he has pain in left upper quadrant is when he takes a deep breath.  Current Medications:  Current Facility-Administered Medications:  .  acetaminophen (TYLENOL) tablet 325 mg, 325 mg, Oral, Q6H PRN, 325 mg at 03/18/20 1328 **OR** acetaminophen (TYLENOL) suppository 325 mg, 325 mg, Rectal, Q6H PRN, Clarnce Flock, MD .  cefTRIAXone (ROCEPHIN) 2 g in sodium chloride 0.9 % 100 mL IVPB, 2 g, Intravenous, Q24H, Tat, David, MD, Last Rate: 200 mL/hr at 03/21/20 1835, 2 g at 03/21/20 1835 .  folic acid (FOLVITE) tablet 1 mg, 1 mg, Oral, Daily, Clarnce Flock, MD, 1 mg at 03/21/20 0947 .  heparin ADULT infusion 100 units/mL (25000 units/248m sodium chloride 0.45%), 1,350 Units/hr, Intravenous, Continuous, Tat, David, MD, Last Rate: 13.5 mL/hr at 03/21/20 1830, 1,350 Units/hr at 03/21/20 1830 .  HYDROmorphone (DILAUDID) injection 0.5 mg, 0.5 mg, Intravenous, Q3H PRN, Johnson, Clanford L, MD .  lactated ringers infusion, , Intravenous, Continuous, Dejion Grillo U, MD, Last Rate: 75 mL/hr at 03/22/20 0240, New Bag at 03/22/20 0240 .  lipase/protease/amylase (CREON) capsule 72,000 Units, 72,000 Units, Oral, TID WC, FDanie Binder MD, 72,000 Units at 03/21/20 1820 .  loperamide (IMODIUM) capsule 2 mg, 2 mg, Oral, TID AC, Fields, Sandi L, MD, 2 mg at 03/21/20 1820 .  magnesium oxide (MAG-OX) tablet 400 mg, 400 mg, Oral, Daily, EClarnce Flock MD, 400 mg at 03/21/20 03212.  multivitamin with minerals tablet 1 tablet, 1 tablet, Oral, Daily, EClarnce Flock MD, 1 tablet at 03/21/20 0947 .  pantoprazole (PROTONIX) EC tablet 40 mg, 40 mg, Oral, BID AC, EClarnce Flock MD, 40 mg at 03/21/20  1821 .  polyethylene glycol (MIRALAX / GLYCOLAX) packet 17 g, 17 g, Oral, Daily PRN, EClarnce Flock MD .  sodium chloride flush (NS) 0.9 % injection 3 mL, 3 mL, Intravenous, Once, EClarnce Flock MD .  thiamine tablet 100 mg, 100 mg, Oral, Daily, EClarnce Flock MD, 100 mg at 03/21/20 0948 .  traZODone (DESYREL) tablet 50 mg, 50 mg, Oral, QHS PRN, EClarnce Flock MD, 50 mg at 03/15/20 2306   Objective: Blood pressure (!) 148/81, pulse 76, temperature 98.9 F (37.2 C), temperature source Oral, resp. rate 18, height '5\' 6"'$  (1.676 m), weight 61.2 kg, SpO2 97 %. Patient is alert and in no acute distress. Cardiac exam with regular rhythm normal S1 and S2.  No murmur or gallop noted. Auscultation lungs reveal vesicular breath sounds bilaterally. Abdomen is full bowel sounds are normal.  On palpation abdomen is soft and nontender with organomegaly or masses. No LE edema noted.  Labs/studies Results:  CBC Latest Ref Rng & Units 03/22/2020 03/21/2020 03/20/2020  WBC 4.0 - 10.5 K/uL 12.8(H) 12.6(H) 11.4(H)  Hemoglobin 13.0 - 17.0 g/dL 9.8(L) 9.5(L) 7.0(L)  Hematocrit 39.0 - 52.0 % 30.3(L) 29.5(L) 22.0(L)  Platelets 150 - 400 K/uL 171 156 143(L)    CMP Latest Ref Rng & Units 03/21/2020 03/20/2020 03/19/2020  Glucose 70 - 99 mg/dL 106(H) 121(H) 116(H)  BUN 8 - 23 mg/dL '11 15 19  '$ Creatinine 0.61 - 1.24 mg/dL 1.14 1.24 1.28(H)  Sodium 135 - 145 mmol/L  134(L) 132(L) 135  Potassium 3.5 - 5.1 mmol/L 3.4(L) 3.4(L) 3.4(L)  Chloride 98 - 111 mmol/L 104 104 104  CO2 22 - 32 mmol/L 18(L) 20(L) 20(L)  Calcium 8.9 - 10.3 mg/dL 7.3(L) 7.1(L) 7.3(L)  Total Protein 6.5 - 8.1 g/dL - 4.9(L) 5.1(L)  Total Bilirubin 0.3 - 1.2 mg/dL - 1.1 1.1  Alkaline Phos 38 - 126 U/L - 66 68  AST 15 - 41 U/L - 33 38  ALT 0 - 44 U/L - 14 15    Hepatic Function Latest Ref Rng & Units 03/20/2020 03/19/2020 03/18/2020  Total Protein 6.5 - 8.1 g/dL 4.9(L) 5.1(L) 5.3(L)  Albumin 3.5 - 5.0 g/dL 1.8(L) 1.9(L) 2.0(L)  AST 15 - 41  U/L 33 38 48(H)  ALT 0 - 44 U/L '14 15 16  '$ Alk Phosphatase 38 - 126 U/L 66 68 70  Total Bilirubin 0.3 - 1.2 mg/dL 1.1 1.1 1.6(H)  Bilirubin, Direct 0.0 - 0.2 mg/dL - 0.5(H) -    Assessment:  #1.  Pancreatitis.  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  Patient was begun on pancreatic enzyme supplements 3 days ago.  Abdominal exam remains benign.  Need to do a follow-up CT prior to discharge to determine stability of pseudocyst.  #2.  Portal vein thrombosis.  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  Discussed with Dr. Carles Collet.  He can be transitioned to oral anticoagulant as he is getting also to being discharged.  #3.  Anemia.  Anemia is primarily due to chronic disease.  His stool was guaiac positive.  He has known rectal telangiectasia/radiation proctitis and he also has history of gastric ulcer which was diagnosed during his last hospitalization 3 weeks ago.  No evidence of gross GI bleed. Patient has received 2 units of PRBCs during this admission.  #4.  Thrombocytopenia has resolved.  Suspect secondary to marrow toxicity due to alcohol.  #5.  Pneumonia.  Patient remains on IV ceftriaxone.  #6.  Diarrhea.  Stool studies are negative.  Patient is on loperamide and diarrhea has subsided.  #7.  Malnutrition.  Serum albumin remains low.  Expect improvement in nutritional status as he is now on heart healthy diet.  Recommendations  Proceed with abdominal pelvic CT with IV contrast.

## 2020-03-22 NOTE — Progress Notes (Signed)
PROGRESS NOTE  Clifford [name] [id number] DOB: 1951/03/28 DOA: 03/15/2020 PCP: [name], FNP  Brief History:  Brief History: 69 year old male with a history of prostate cancer, alcohol abuse, thrombocytopenia, hypertension, hyperlipidemia, GI bleed presenting with generalized weakness and diarrhea. He denied any fevers, chills but has some dyspnea on exertion. There is no nausea or vomiting. There is no hematochezia or melena. The patient normally drinks 1 pint of gin on a daily basis with the last drink on 03/11/2020. His wife had stated that the patient had been confused over the last several days prior to admission. He has had poor oral intake. CT of the abdomen and pelvis showed thrombus in the SMV and portal vein as well as splenic vein. Hematology was consulted. The patient was started on IV heparin. There was subtle peripancreatic edema with dilatation of the pancreatic duct in the tail with a hypoenhancing lesion. There was concern for neoplasm versus pseudocyst versus pancreatitis. There was bilateral lower lobe consolidation.  Blood cultures showed Klebsiella pneumoniae.  The patient was continued on ceftriaxone 2 grams daily.  GI was consulted to assist with his acute on chronic pancreatitis.  He was placed on bowel rest and started on IV fluids.  He gradually improved and his diet was gradually advanced.  Assessment/Plan: Sepsis -Present at the time of admission -Secondary to bacteremiaandpneumonia -Lactic acid peaked 1.8 -PCT 4.78 -lactic 1.8 -Continue ceftriaxone2 grams daily -sepsis physiology resolved  Klebsiella bacteremia -Continue ceftriaxone 2 g daily pending final culture data -Source is Pneumonia -recurrent fever likely due to pancreatitis>>resolved -5/6 repeat blood culture--neg  Lobar pneumonia -Continue ceftriaxone  -Stable on room air  Portal vein and SMV thrombosis -Continue IV heparin initially -transition to po  NOAC  -Appreciate hematology consult -Monitor platelet counts-->improving -Splenic vein may also be occluded -obtain echo--EF 55-60%, no WMA, mild MR/TR, no thrombi  Diarrhea -C. difficile negative -Stool pathogen panel--neg -Suspect this is likely due to pancreatic insufficiency -Creon and loperamide started by GI  Acute on chronic renal failure--CKD stage IIIa -Secondary to sepsis and volume depletion -Baseline creatinine 1.0-1.3 -Presented with serum creatinine 2.44 -Continue IV fluids-->improved  Acute on chronic pancreatitis -tolerating clears>>full liquid>>>heart healthy -diet advancement per GI -check lipid panel--triglycerides 117 -lipase peaked 1236 -continue IVF>>>saline lock -Creon added by GI  Thrombocytopenia -Multifactorial including myelosuppression from his alcohol with acute worsening secondary to sepsisand acute clot -improving with tx of infection -HIT panel neg -am CBC  Pancreatic tail hypodensity/mass -Appreciate GI consult--suspect the changes are likely due to chronic pancreatitis with small pseudocysts although cystic pancreatic neoplasm remains in the differential diagnosis  Deconditioning -PT eval-->SNF  Acute on chronic anemia -transfused 2 units PRBC 5/8 -FOBT--neg -Hgb stable since transfuseion  Alcohol abuse -Alcohol withdrawal protocol -no signs of withdrawal  Hypokalemia -replete  Hypomagnesemia -replete-->mag remains 1.2 -give IV Mag  GOC  -full code, full scope of care    Disposition Plan: Patient From: Home D/C Place:Home when cleared by GI--refuses SNF Barriers: Not Clinically Stable--electrolyte derangement. GI work up not finished; on IV abx  Family Communication:Spouse updatedat bedside 5/10  Consultants:GI, hematology, palliative  Code Status: FULL   DVT Prophylaxis:IVHeparin    Procedures: As Listed in Progress Note Above  Antibiotics: Ceftriaxone  5/3>>>          Subjective: Patient denies fevers, chills, headache, chest pain, dyspnea, nausea, vomiting, diarrhea, abdominal pain, dysuria, hematuria, hematochezia, and melena.   Objective: Vitals:   03/21/20  2131 03/22/20 0534 03/22/20 1337 03/22/20 1438  BP: (!) 151/91 (!) 148/81 (!) 141/84   Pulse: 77 76 79   Resp: 18 18 18    Temp: 98.7 F (37.1 C) 98.9 F (37.2 C) 98.5 F (36.9 C)   TempSrc:  Oral    SpO2: 98% 97% 97% 98%  Weight:      Height:        Intake/Output Summary (Last 24 hours) at 03/22/2020 1558 Last data filed at 03/22/2020 1401 Gross per 24 hour  Intake 2723.43 ml  Output 1400 ml  Net 1323.43 ml   Weight change:  Exam:   General:  Pt is alert, follows commands appropriately, not in acute distress  HEENT: No icterus, No thrush, No neck mass, [place]/AT  Cardiovascular: RRR, S1/S2, no rubs, no gallops  Respiratory: bibasilar rales. No wheeze  Abdomen: Soft/+BS, non tender, non distended, no guarding  Extremities: No edema, No lymphangitis, No petechiae, No rashes, no synovitis   Data Reviewed: I have personally reviewed following labs and imaging studies Basic Metabolic Panel: Recent Labs  Lab 03/17/20 0131 03/18/20 0444 03/19/20 0508 03/20/20 0242 03/21/20 0607  NA 133* 135 135 132* 134*  K 3.4* 3.1* 3.4* 3.4* 3.4*  CL 104 103 104 104 104  CO2 16* 19* 20* 20* 18*  GLUCOSE 107* 111* 116* 121* 106*  BUN 31* 27* 19 15 11   CREATININE 1.76* 1.45* 1.28* 1.24 1.14  CALCIUM 7.3* 7.5* 7.3* 7.1* 7.3*  MG  --   --   --   --  1.2*   Liver Function Tests: Recent Labs  Lab 03/16/20 0618 03/18/20 0444 03/19/20 0508 03/20/20 0242  AST 58* 48* 38 33  ALT 23 16 15 14   ALKPHOS 70 70 68 66  BILITOT 2.5* 1.6* 1.1 1.1  PROT 6.3* 5.3* 5.1* 4.9*  ALBUMIN 2.7* 2.0* 1.9* 1.8*   Recent Labs  Lab 03/16/20 0618 03/17/20 0131 03/17/20 0807 03/18/20 0444 03/19/20 0508 03/20/20 0242  LIPASE 820* 1,236*  --  563* 927* 659*  AMYLASE  --    --  519* 520*  --   --    No results for input(s): AMMONIA in the last 168 hours. Coagulation Profile: Recent Labs  Lab 03/15/20 2223 03/16/20 0618  INR 1.3* 1.2   CBC: Recent Labs  Lab 03/18/20 0444 03/19/20 0508 03/20/20 0242 03/21/20 0607 03/22/20 0514  WBC 9.1 10.4 11.4* 12.6* 12.8*  HGB 7.4* 7.5* 7.0* 9.5* 9.8*  HCT 23.1* 23.8* 22.0* 29.5* 30.3*  MCV 92.8 91.9 93.2 91.0 90.2  PLT 54* 103* 143* 156 171   Cardiac Enzymes: No results for input(s): CKTOTAL, CKMB, CKMBINDEX, TROPONINI in the last 168 hours. BNP: Invalid input(s): POCBNP CBG: Recent Labs  Lab 03/16/20 0740  GLUCAP 105*   HbA1C: No results for input(s): HGBA1C in the last 72 hours. Urine analysis:    Component Value Date/Time   COLORURINE YELLOW 03/20/2020 1700   APPEARANCEUR CLEAR 03/20/2020 1700   LABSPEC 1.012 03/20/2020 1700   PHURINE 6.0 03/20/2020 1700   GLUCOSEU NEGATIVE 03/20/2020 1700   HGBUR SMALL (A) 03/20/2020 1700   BILIRUBINUR NEGATIVE 03/20/2020 1700   KETONESUR NEGATIVE 03/20/2020 1700   PROTEINUR 30 (A) 03/20/2020 1700   NITRITE NEGATIVE 03/20/2020 1700   LEUKOCYTESUR NEGATIVE 03/20/2020 1700   Sepsis Labs: @LABRCNTIP (procalcitonin:4,lacticidven:4) ) Recent Results (from the past 240 hour(s))  Respiratory Panel by RT PCR (Flu A&B, Covid) - Nasopharyngeal Swab     Status: None   Collection Time: 03/15/20  3:52  PM   Specimen: Nasopharyngeal Swab  Result Value Ref Range Status   SARS Coronavirus 2 by RT PCR NEGATIVE NEGATIVE Final    Comment: (NOTE) SARS-CoV-2 target nucleic acids are NOT DETECTED. The SARS-CoV-2 RNA is generally detectable in upper respiratoy specimens during the acute phase of infection. The lowest concentration of SARS-CoV-2 viral copies this assay can detect is 131 copies/mL. A negative result does not preclude SARS-Cov-2 infection and should not be used as the sole basis for treatment or other patient management decisions. A negative result may occur  with  improper specimen collection/handling, submission of specimen other than nasopharyngeal swab, presence of viral mutation(s) within the areas targeted by this assay, and inadequate number of viral copies (<131 copies/mL). A negative result must be combined with clinical observations, patient history, and epidemiological information. The expected result is Negative. Fact Sheet for Patients:  PinkCheek.be Fact Sheet for Healthcare Providers:  GravelBags.it This test is not yet ap proved or cleared by the [place] FDA and  has been authorized for detection and/or diagnosis of SARS-CoV-2 by FDA under an Emergency Use Authorization (EUA). This EUA will remain  in effect (meaning this test can be used) for the duration of the COVID-19 declaration under Section 564(b)(1) of the Act, 21 U.S.C. section 360bbb-3(b)(1), unless the authorization is terminated or revoked sooner.    Influenza A by PCR NEGATIVE NEGATIVE Final   Influenza B by PCR NEGATIVE NEGATIVE Final    Comment: (NOTE) The Xpert Xpress SARS-CoV-2/FLU/RSV assay is intended as an aid in  the diagnosis of influenza from Nasopharyngeal swab specimens and  should not be used as a sole basis for treatment. Nasal washings and  aspirates are unacceptable for Xpert Xpress SARS-CoV-2/FLU/RSV  testing. Fact Sheet for Patients: PinkCheek.be Fact Sheet for Healthcare Providers: GravelBags.it This test is not yet approved or cleared by the [place] FDA and  has been authorized for detection and/or diagnosis of SARS-CoV-2 by  FDA under an Emergency Use Authorization (EUA). This EUA will remain  in effect (meaning this test can be used) for the duration of the  Covid-19 declaration under Section 564(b)(1) of the Act, 21  U.S.C. section 360bbb-3(b)(1), unless the authorization is  terminated or revoked. Performed  at [facility], [address], [place], [place] [address]   Blood culture (routine x 2)     Status: Abnormal   Collection Time: 03/15/20  3:52 PM   Specimen: BLOOD RIGHT WRIST  Result Value Ref Range Status   Specimen Description   Final    BLOOD RIGHT WRIST Performed at [facility], [address] [place], [place] [address]    Special Requests   Final    BOTTLES DRAWN AEROBIC AND ANAEROBIC Blood Culture adequate volume Performed at [facility], [address], [place],  [address]    Culture  Setup Time   Final    IN BOTH AEROBIC AND ANAEROBIC BOTTLES GRAM NEGATIVE RODS Gram Stain Report Called to,Read Back By and Verified With: T VILLALOBOS,RN @0433  03/16/20 MKELLY    Culture KLEBSIELLA PNEUMONIAE (A)  Final   Report Status 03/18/2020 FINAL  Final   Organism ID, Bacteria KLEBSIELLA PNEUMONIAE  Final      Susceptibility   Klebsiella pneumoniae - MIC*    AMPICILLIN >=32 RESISTANT Resistant     CEFAZOLIN 16 SENSITIVE Sensitive     CEFEPIME <=1 SENSITIVE Sensitive     CEFTAZIDIME <=1 SENSITIVE Sensitive     CEFTRIAXONE <=1 SENSITIVE Sensitive  CIPROFLOXACIN <=0.25 SENSITIVE Sensitive     GENTAMICIN <=1 SENSITIVE Sensitive     IMIPENEM <=0.25 SENSITIVE Sensitive     TRIMETH/SULFA <=20 SENSITIVE Sensitive     AMPICILLIN/SULBACTAM >=32 RESISTANT Resistant     PIP/TAZO 32 INTERMEDIATE Intermediate     * KLEBSIELLA PNEUMONIAE  Blood Culture ID Panel (Reflexed)     Status: Abnormal   Collection Time: 03/15/20  3:52 PM  Result Value Ref Range Status   Enterococcus species NOT DETECTED NOT DETECTED Final   Listeria monocytogenes NOT DETECTED NOT DETECTED Final   Staphylococcus species NOT DETECTED NOT DETECTED Final   Staphylococcus aureus (BCID) NOT DETECTED NOT DETECTED Final   Streptococcus species NOT DETECTED NOT DETECTED Final   Streptococcus agalactiae NOT DETECTED NOT DETECTED Final   Streptococcus pneumoniae NOT DETECTED NOT DETECTED Final   Streptococcus  pyogenes NOT DETECTED NOT DETECTED Final   Acinetobacter baumannii NOT DETECTED NOT DETECTED Final   Enterobacteriaceae species DETECTED (A) NOT DETECTED Final    Comment: Enterobacteriaceae represent a large family of gram-negative bacteria, not a single organism. CRITICAL RESULT CALLED TO, READ BACK BY AND VERIFIED WITH: Deborra Medina PharmD 9:10 03/16/20 (wilsonm)    Enterobacter cloacae complex NOT DETECTED NOT DETECTED Final   Escherichia coli NOT DETECTED NOT DETECTED Final   Klebsiella oxytoca NOT DETECTED NOT DETECTED Final   Klebsiella pneumoniae DETECTED (A) NOT DETECTED Final    Comment: CRITICAL RESULT CALLED TO, READ BACK BY AND VERIFIED WITH: Deborra Medina PharmD 9:10 03/16/20 ([email or web address])    Proteus species NOT DETECTED NOT DETECTED Final   Serratia marcescens NOT DETECTED NOT DETECTED Final   Carbapenem resistance NOT DETECTED NOT DETECTED Final   Haemophilus influenzae NOT DETECTED NOT DETECTED Final   Neisseria meningitidis NOT DETECTED NOT DETECTED Final   Pseudomonas aeruginosa NOT DETECTED NOT DETECTED Final   Candida albicans NOT DETECTED NOT DETECTED Final   Candida glabrata NOT DETECTED NOT DETECTED Final   Candida krusei NOT DETECTED NOT DETECTED Final   Candida parapsilosis NOT DETECTED NOT DETECTED Final   Candida tropicalis NOT DETECTED NOT DETECTED Final    Comment: Performed at [facility], [place] [address] [place], [place] [address]  Blood culture (routine x 2)     Status: Abnormal   Collection Time: 03/15/20  3:53 PM   Specimen: BLOOD RIGHT HAND  Result Value Ref Range Status   Specimen Description   Final    BLOOD RIGHT HAND BOTTLES DRAWN AEROBIC AND ANAEROBIC Performed at [facility], [address], [place], [place] [address]    Special Requests   Final    Blood Culture adequate volume Performed at [facility], [address], [place], [place] [address]    Culture  Setup Time   Final    IN BOTH AEROBIC AND ANAEROBIC BOTTLES GRAM NEGATIVE RODS Gram  Stain Report Called to,Read Back By and Verified With: T Manson Passey @0432  03/16/20 [facility] Performed at [facility], [address] [place], [place] [address]    Culture (A)  Final    KLEBSIELLA PNEUMONIAE SUSCEPTIBILITIES PERFORMED ON PREVIOUS CULTURE WITHIN THE LAST 5 DAYS. Performed at [facility], [place] [address] [place], [place] [address]    Report Status 03/18/2020 FINAL  Final  C Difficile Quick Screen w PCR reflex     Status: None   Collection Time: 03/16/20  2:37 PM   Specimen: STOOL  Result Value Ref Range Status   C Diff antigen NEGATIVE NEGATIVE Final   C Diff  toxin NEGATIVE NEGATIVE Final   C Diff interpretation No C. difficile detected.  Final    Comment: Performed at [facility], [address] [place], [place] [address]  Gastrointestinal Panel by PCR , Stool     Status: None   Collection Time: 03/17/20  5:19 PM   Specimen: Stool  Result Value Ref Range Status   Campylobacter species NOT DETECTED NOT DETECTED Final   Plesimonas shigelloides NOT DETECTED NOT DETECTED Final   Salmonella species NOT DETECTED NOT DETECTED Final   Yersinia enterocolitica NOT DETECTED NOT DETECTED Final   Vibrio species NOT DETECTED NOT DETECTED Final   Vibrio cholerae NOT DETECTED NOT DETECTED Final   Enteroaggregative E coli (EAEC) NOT DETECTED NOT DETECTED Final   Enteropathogenic E coli (EPEC) NOT DETECTED NOT DETECTED Final   Enterotoxigenic E coli (ETEC) NOT DETECTED NOT DETECTED Final   Shiga like toxin producing E coli (STEC) NOT DETECTED NOT DETECTED Final   Shigella/Enteroinvasive E coli (EIEC) NOT DETECTED NOT DETECTED Final   Cryptosporidium NOT DETECTED NOT DETECTED Final   Cyclospora cayetanensis NOT DETECTED NOT DETECTED Final   Entamoeba histolytica NOT DETECTED NOT DETECTED Final   Giardia lamblia NOT DETECTED NOT DETECTED Final   Adenovirus F40/41 NOT DETECTED NOT DETECTED Final   Astrovirus NOT DETECTED NOT DETECTED Final   Norovirus GI/GII NOT DETECTED NOT  DETECTED Final   Rotavirus A NOT DETECTED NOT DETECTED Final   Sapovirus (I, II, IV, and V) NOT DETECTED NOT DETECTED Final    Comment: Performed at [facility], [address] [place], [place] [address]  Culture, blood (Routine X 2) w Reflex to ID Panel     Status: None (Preliminary result)   Collection Time: 03/18/20  7:26 AM   Specimen: BLOOD RIGHT HAND  Result Value Ref Range Status   Specimen Description BLOOD RIGHT HAND  Final   Special Requests   Final    BOTTLES DRAWN AEROBIC AND ANAEROBIC Blood Culture results may not be optimal due to an inadequate volume of blood received in culture bottles   Culture   Final    NO GROWTH 4 DAYS Performed at [facility], [address], [place], [place] [address]    Report Status PENDING  Incomplete  Culture, blood (Routine X 2) w Reflex to ID Panel     Status: None (Preliminary result)   Collection Time: 03/18/20  7:26 AM   Specimen: BLOOD LEFT HAND  Result Value Ref Range Status   Specimen Description BLOOD LEFT HAND  Final   Special Requests   Final    BOTTLES DRAWN AEROBIC AND ANAEROBIC Blood Culture adequate volume   Culture   Final    NO GROWTH 4 DAYS Performed at [facility], [address] [place], [place] [address]    Report Status PENDING  Incomplete  Urine culture     Status: None   Collection Time: 03/20/20  5:00 PM   Specimen: Urine, Clean Catch  Result Value Ref Range Status   Specimen Description   Final    URINE, CLEAN CATCH Performed at [facility], [address], [place], [place] [address]    Special Requests   Final    NONE Performed at [facility], [address], [place], [place] [address]    Culture   Final    NO GROWTH Performed at [facility], [address], [place],  [address]    Report Status 03/22/2020 FINAL  Final     Scheduled  Meds:  folic acid  1 mg Oral Daily   lipase/protease/amylase  72,000 Units Oral TID WC   loperamide  2 mg Oral TID AC   magnesium oxide   400 mg Oral Daily   multivitamin with minerals  1 tablet Oral Daily   pantoprazole  40 mg Oral BID AC   sodium chloride flush  3 mL Intravenous Once   thiamine  100 mg Oral Daily   Continuous Infusions:  cefTRIAXone (ROCEPHIN)  IV 2 g (03/21/20 1835)   heparin 1,350 Units/hr (03/22/20 1211)   lactated ringers 75 mL/hr at 03/22/20 0240    Procedures/Studies: DG Chest 2 View  Result Date: 03/15/2020 CLINICAL DATA:  Rales EXAM: CHEST - 2 VIEW COMPARISON:  03/04/2020 FINDINGS: Heart is normal size. Left lower lobe atelectasis or infiltrate noted. No confluent opacity on the right. No effusions or acute bony abnormality. IMPRESSION: Left basilar atelectasis or infiltrate. Electronically Signed   By: Rolm Baptise M.D.   On: 03/15/2020 21:40   CT HEAD WO CONTRAST  Result Date: 03/15/2020 CLINICAL DATA:  Encephalopathy EXAM: CT HEAD WITHOUT CONTRAST TECHNIQUE: Contiguous axial images were obtained from the base of the skull through the vertex without intravenous contrast. COMPARISON:  None. FINDINGS: Brain: There is atrophy and chronic small vessel disease changes. No acute intracranial abnormality. Specifically, no hemorrhage, hydrocephalus, mass lesion, acute infarction, or significant intracranial injury. Vascular: No hyperdense vessel or unexpected calcification. Skull: No acute calvarial abnormality. Sinuses/Orbits: Visualized paranasal sinuses and mastoids clear. Orbital soft tissues unremarkable. Other: None IMPRESSION: Atrophy, chronic microvascular disease. No acute intracranial abnormality. Electronically Signed   By: Rolm Baptise M.D.   On: 03/15/2020 21:39   CT ABDOMEN PELVIS W CONTRAST  Result Date: 03/15/2020 CLINICAL DATA:  Diarrhea. Abdominal tenderness. Elevated lipase. History of prostate cancer. EXAM: CT ABDOMEN AND PELVIS WITH CONTRAST TECHNIQUE: Multidetector CT imaging of the abdomen and pelvis was performed using the standard protocol following bolus administration of  intravenous contrast. CONTRAST:  32mL OMNIPAQUE IOHEXOL 300 MG/ML  SOLN COMPARISON:  None. FINDINGS: Lower chest: Collapse/consolidative changes noted in the lower lobes bilaterally, left greater than right. No substantial pleural effusion. Hepatobiliary: The liver shows diffusely decreased attenuation suggesting fat deposition. No suspicious focal abnormality within the liver parenchyma. There is no evidence for gallstones, gallbladder wall thickening, or pericholecystic fluid. No intrahepatic or extrahepatic biliary dilation. Pancreas: Subtle peripancreatic edema evident with either irregular substantial dilatation of the pancreatic duct in the tail of pancreas versus cystic pancreatic lesion measuring on the order of 3.1 x 1.8 cm. Spleen: Edema noted in the splenic hilum. Spleen otherwise unremarkable. Adrenals/Urinary Tract: No adrenal nodule or mass. Right kidney unremarkable. 10 mm hypoattenuating lesion noted lower pole left kidney with attenuation too high to be a simple cyst. No evidence for hydroureter. The urinary bladder appears normal for the degree of distention. Stomach/Bowel: Stomach is nondistended. 2.2 x 1.4 cm soft tissue lesion or fluid collection involving the posterior wall of the gastric fundus appears to communicate with the pancreatic tail lesion. Duodenum is normally positioned as is the ligament of Treitz. No small bowel wall thickening. No small bowel dilatation. The terminal ileum is normal. The appendix is best seen on coronal images and is unremarkable. No gross colonic mass. No colonic wall thickening. Vascular/Lymphatic: No abdominal aortic aneurysm. No abdominal aortic atherosclerotic calcification. There is no gastrohepatic or hepatoduodenal ligament lymphadenopathy. No retroperitoneal or mesenteric lymphadenopathy. Thrombus is identified in the portal splenic confluence, involving the superior mesenteric vein and portal  vein. Splenic vein may well be occluded. Reproductive:  Fiducial markers noted in the prostate gland. Other: No intraperitoneal free fluid. Musculoskeletal: No worrisome lytic or sclerotic osseous abnormality. IMPRESSION: 1. Thrombus is identified in the portosplenic confluence, involving the superior mesenteric vein and portal vein. Splenic vein may well be occluded. 2. Subtle peripancreatic edema with either irregular substantial dilatation of the pancreatic duct in the tail of pancreas versus ill-defined cystic or hypoenhancing pancreatic lesion measuring on the order of 3.1 x 1.8 cm. Imaging features raise concern for pancreatic neoplasm although this could simply represent pancreatitis with parenchymal pseudocyst. Abdominal MRI without and with contrast may prove helpful to further evaluate. 3. 2.2 x 1.4 cm soft tissue lesion or fluid collection involving the posterior wall of the gastric fundus appears to communicate with the pancreatic tail lesion. 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 4. Collapse/consolidative changes in the lower lobes bilaterally, left greater than right. 5. Hepatic steatosis. 6. 10 mm hypoattenuating lesion lower pole left kidney with attenuation too high to be a simple cyst. This may be a cyst complicated by proteinaceous debris or hemorrhage, but neoplasm cannot be excluded. This could also be further evaluated the time of follow-up MRI. Electronically Signed   By: Misty Stanley M.D.   On: 03/15/2020 17:04   DG Chest Port 1 View  Result Date: 03/04/2020 CLINICAL DATA:  Fever, generalized weakness. EXAM: PORTABLE CHEST 1 VIEW COMPARISON:  Chest x-rays dated 02/07/2020 and 08/13/2018. FINDINGS: Heart size and mediastinal contours are stable. Lungs are clear. No pleural effusion is seen. Osseous structures about the chest are unremarkable. IMPRESSION: No active disease. No evidence of pneumonia or pulmonary edema. Electronically Signed   By: Franki Cabot M.D.   On: 03/04/2020 14:54   ECHOCARDIOGRAM COMPLETE  Result Date: 03/19/2020     ECHOCARDIOGRAM REPORT   Patient Name:   CAUY BUSSELL Date of Exam: 03/19/2020 Medical Rec #:  [id number]         Height:       66.0 in Accession #:    [id number]        Weight:       135.0 lb Date of Birth:  08/14/1951          BSA:          1.692 m Patient Age:    37 years          BP:           141/81 mmHg Patient Gender: M                 HR:           85 bpm. Exam Location:  [facility] Procedure: 2D Echo Indications:    Mesenteric thrombosis ([place]) [id number]  History:        Patient has no prior history of Echocardiogram examinations.                 Risk Factors:Hypertension, Dyslipidemia and Non-Smoker.                 Alcoholism, Sepsis.  Sonographer:    Leavy Cella RDCS (AE) Referring Phys: [id number] [place] IMPRESSIONS  1. Left ventricular ejection fraction, by estimation, is 55 to 60%. The left ventricle has normal function. The left ventricle has no regional wall motion abnormalities. Left ventricular diastolic parameters were normal.  2. Right ventricular systolic function is low normal. The right ventricular size is normal. Tricuspid regurgitation signal is inadequate for assessing PA pressure.  3. The mitral valve is grossly normal. Mild mitral valve regurgitation.  4. The aortic valve is tricuspid. Aortic valve regurgitation is not visualized.  5. The inferior vena cava is normal in size with greater than 50% respiratory variability, suggesting right atrial pressure of 3 mmHg. FINDINGS  Left Ventricle: Left ventricular ejection fraction, by estimation, is 55 to 60%. The left ventricle has normal function. The left ventricle has no regional wall motion abnormalities. The left ventricular internal cavity size was normal in size. There is  no left ventricular hypertrophy. Left ventricular diastolic parameters were normal. Right Ventricle: The right ventricular size is normal. No increase in right ventricular wall thickness. Right ventricular systolic function is low normal. Tricuspid regurgitation signal  is inadequate for assessing PA pressure. Left Atrium: Left atrial size was normal in size. Right Atrium: Right atrial size was normal in size. Pericardium: Trivial pericardial effusion is present. The pericardial effusion is posterior to the left ventricle. Mitral Valve: The mitral valve is grossly normal. Mild mitral valve regurgitation. Tricuspid Valve: The tricuspid valve is grossly normal. Tricuspid valve regurgitation is mild. Aortic Valve: The aortic valve is tricuspid. Aortic valve regurgitation is not visualized. Pulmonic Valve: The pulmonic valve was grossly normal. Pulmonic valve regurgitation is trivial. Aorta: The aortic root is normal in size and structure. Venous: The inferior vena cava is normal in size with greater than 50% respiratory variability, suggesting right atrial pressure of 3 mmHg. IAS/Shunts: No atrial level shunt detected by color flow Doppler. Rozann Lesches MD Electronically signed by Rozann Lesches MD Signature Date/Time: 03/19/2020/11:01:10 AM    Final     Orson Eva, DO  [facility]  If 7PM-7AM, please contact night-coverage [email or web address] Password [email or web address] 03/22/2020, 3:58 PM   LOS: 7 days

## 2020-03-23 LAB — CULTURE, BLOOD (ROUTINE X 2)
Culture: NO GROWTH
Culture: NO GROWTH
Special Requests: ADEQUATE

## 2020-03-23 LAB — BASIC METABOLIC PANEL
Anion gap: 13 (ref 5–15)
BUN: 7 mg/dL — ABNORMAL LOW (ref 8–23)
CO2: 22 mmol/L (ref 22–32)
Calcium: 7.7 mg/dL — ABNORMAL LOW (ref 8.9–10.3)
Chloride: 101 mmol/L (ref 98–111)
Creatinine, Ser: 1.1 mg/dL (ref 0.61–1.24)
GFR calc Af Amer: >60 mL/min (ref >60)
GFR calc non Af Amer: >60 mL/min (ref >60)
Glucose, Bld: 94 mg/dL (ref 70–99)
Potassium: 3.3 mmol/L — ABNORMAL LOW (ref 3.5–5.1)
Sodium: 136 mmol/L (ref 135–145)

## 2020-03-23 LAB — CBC
HCT: 30.2 % — ABNORMAL LOW (ref 39.0–52.0)
Hemoglobin: 9.7 g/dL — ABNORMAL LOW (ref 13.0–17.0)
MCH: 29.5 pg (ref 26.0–34.0)
MCHC: 32.1 g/dL (ref 30.0–36.0)
MCV: 91.8 fL (ref 80.0–100.0)
Platelets: 154 10*3/uL (ref 150–400)
RBC: 3.29 MIL/uL — ABNORMAL LOW (ref 4.22–5.81)
RDW: 16.7 % — ABNORMAL HIGH (ref 11.5–15.5)
WBC: 9.9 10*3/uL (ref 4.0–10.5)
nRBC: 0 % (ref 0.0–0.2)

## 2020-03-23 LAB — MAGNESIUM: Magnesium: 1.4 mg/dL — ABNORMAL LOW (ref 1.7–2.4)

## 2020-03-23 LAB — LIPASE, BLOOD: Lipase: 68 U/L — ABNORMAL HIGH (ref 11–51)

## 2020-03-23 MED ORDER — FUROSEMIDE 40 MG PO TABS: 40.0000 mg | ORAL_TABLET | Freq: Every day | ORAL | 1 refills | Status: AC

## 2020-03-23 MED ORDER — SPIRONOLACTONE 25 MG PO TABS
12.5000 mg | ORAL_TABLET | Freq: Every day | ORAL | Status: DC
  Administered 2020-03-23: 12.5 mg via ORAL
  Filled 2020-03-23 (×2): qty 0.5
  Filled 2020-03-23: qty 1

## 2020-03-23 MED ORDER — CIPROFLOXACIN HCL 250 MG PO TABS
500.0000 mg | ORAL_TABLET | Freq: Two times a day (BID) | ORAL | Status: DC
  Administered 2020-03-23: 500 mg via ORAL
  Filled 2020-03-23: qty 2

## 2020-03-23 MED ORDER — PANCRELIPASE (LIP-PROT-AMYL) 36000-114000 UNITS PO CPEP: 72000.0000 [IU] | ORAL_CAPSULE | Freq: Three times a day (TID) | ORAL | 1 refills | Status: AC

## 2020-03-23 MED ORDER — MAGNESIUM SULFATE 2 GM/50ML IV SOLN
2.0000 g | Freq: Once | INTRAVENOUS | Status: AC
  Administered 2020-03-23: 10:00:00 2 g via INTRAVENOUS
  Filled 2020-03-23: qty 50

## 2020-03-23 MED ORDER — FUROSEMIDE 40 MG PO TABS
40.0000 mg | ORAL_TABLET | Freq: Every day | ORAL | Status: DC
  Administered 2020-03-23: 40 mg via ORAL
  Filled 2020-03-23: qty 1

## 2020-03-23 MED ORDER — MAGNESIUM OXIDE 400 (241.3 MG) MG PO TABS: 400.0000 mg | ORAL_TABLET | Freq: Two times a day (BID) | ORAL | 1 refills | Status: AC

## 2020-03-23 MED ORDER — POTASSIUM CHLORIDE CRYS ER 20 MEQ PO TBCR
40.0000 meq | EXTENDED_RELEASE_TABLET | Freq: Once | ORAL | Status: AC
  Administered 2020-03-23: 40 meq via ORAL
  Filled 2020-03-23: qty 2

## 2020-03-23 MED ORDER — SPIRONOLACTONE 25 MG PO TABS: 12.5000 mg | ORAL_TABLET | Freq: Every day | ORAL | 1 refills | Status: AC

## 2020-03-23 MED ORDER — CIPROFLOXACIN HCL 500 MG PO TABS: 500.0000 mg | ORAL_TABLET | Freq: Two times a day (BID) | ORAL | 0 refills | Status: AC

## 2020-03-23 MED ORDER — APIXABAN 5 MG PO TABS: 10.0000 mg | ORAL_TABLET | Freq: Two times a day (BID) | ORAL | 1 refills | Status: AC

## 2020-03-23 NOTE — Progress Notes (Signed)
Subjective:  Patient has no complaints.  He states his appetite is coming back.  He is now passing mushy stools.  He denies melena or rectal bleeding. He states he did walk with help of physical therapist.  Objective: Blood pressure (!) 155/92, pulse 80, temperature 98.5 F (36.9 C), temperature source Oral, resp. rate 20, height '5\' 6"'$  (1.676 m), weight 61.2 kg, SpO2 100 %. Patient is alert in no acute distress. He is sitting in a recliner chair. Abdomen is full but soft and nontender with organomegaly or masses. No LE edema or clubbing noted.  Labs/studies Results:  CBC Latest Ref Rng & Units 03/23/2020 03/22/2020 03/21/2020  WBC 4.0 - 10.5 K/uL 9.9 12.8(H) 12.6(H)  Hemoglobin 13.0 - 17.0 g/dL 9.7(L) 9.8(L) 9.5(L)  Hematocrit 39.0 - 52.0 % 30.2(L) 30.3(L) 29.5(L)  Platelets 150 - 400 K/uL 154 171 156    CMP Latest Ref Rng & Units 03/23/2020 03/21/2020 03/20/2020  Glucose 70 - 99 mg/dL 94 106(H) 121(H)  BUN 8 - 23 mg/dL 7(L) 11 15  Creatinine 0.61 - 1.24 mg/dL 1.10 1.14 1.24  Sodium 135 - 145 mmol/L 136 134(L) 132(L)  Potassium 3.5 - 5.1 mmol/L 3.3(L) 3.4(L) 3.4(L)  Chloride 98 - 111 mmol/L 101 104 104  CO2 22 - 32 mmol/L 22 18(L) 20(L)  Calcium 8.9 - 10.3 mg/dL 7.7(L) 7.3(L) 7.1(L)  Total Protein 6.5 - 8.1 g/dL - - 4.9(L)  Total Bilirubin 0.3 - 1.2 mg/dL - - 1.1  Alkaline Phos 38 - 126 U/L - - 66  AST 15 - 41 U/L - - 33  ALT 0 - 44 U/L - - 14    Hepatic Function Latest Ref Rng & Units 03/22/2020 03/20/2020 03/19/2020  Total Protein 6.5 - 8.1 g/dL - 4.9(L) 5.1(L)  Albumin 3.5 - 5.0 g/dL 1.9(L) 1.8(L) 1.9(L)  AST 15 - 41 U/L - 33 38  ALT 0 - 44 U/L - 14 15  Alk Phosphatase 38 - 126 U/L - 66 68  Total Bilirubin 0.3 - 1.2 mg/dL - 1.1 1.1  Bilirubin, Direct 0.0 - 0.2 mg/dL - - 0.5(H)   Serum lipase 68; it was 659 3 days ago and 927 4 days ago.  It speaks to 1236 6 days ago.  Assessment:  #1.  Pancreatitis.  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  He just  has not been diagnosed previously.  CT yesterday revealed decrease in the site of pseudocyst in the tail of pancreas.  Serum lipase has decreased about 10 fold in last 3 days and now near normal.  Patient is tolerating pancreatic enzyme supplement.  #2.  Portal and SMV thrombus.  Patient was initially treated with heparin infusion and transition to apixaban yesterday.  #3.  Anemia.  His anemia is felt to be predominantly secondary to acute and chronic illness.  He has a history of peptic ulcer disease diagnosed about 3 weeks ago when he was hospitalized with melena and anemia.  Stool positive during this admission which is not surprising to me as he has known radiation proctitis/telangiectasia.  Will need to watch him closely for evidence of overt bleed since he is on anticoagulants.  #4.  Diarrhea.  Stool studies were negative.  He may have an element of malabsorption.  Stool frequency has slowed down with on scheduled loperamide.  #5.  Alcoholic liver disease.  He has alcoholic hepatitis.  Transaminases are now normal.  Serum albumin is quite low which again is multifactorial.  Based on imaging studies  I do not believe he has cirrhosis.  #6.  Thrombocytopenia has resolved.  Platelet count dropped to around 37K when it was the lowest.  Thrombocytopenia most likely due to alcohol induced marrow toxicity and now has reversed.  #7.  Fluid overload.  CT yesterday revealed bilateral pleural effusions scant amount of ascites abdominal wall edema.  Fluid overload would appear to be due to hypoalbuminemia and pneumonia and less likely due to pancreatitis.  His clinical course was not one of fulminant pancreatitis patient is receiving albumin followed by furosemide and diuresing.  Urine output reported to be 480 0 mL in the last 24 hours.  #8.  Deconditioning secondary to acute and chronic illness.  Notify Ms. Laneta Simmers of PT appreciated.  #9.  Community-acquired pneumonia.  Patient remains on  ceftriaxone.  Recommendations  Continue pantoprazole loperamide and pancreatic enzyme supplement at current dose.

## 2020-03-23 NOTE — TOC Transition Note (Signed)
Transition of Care [facility]) - CM/SW Discharge Note   Patient Details  Name: Clifford [name] MRN: [id number] Date of Birth: 09-26-1951  [facility] CM/SW Contact:  Boneta Lucks, RN Phone Number: 03/23/2020, 1:10 PM   Clinical Narrative:   Patient discharging home. Refusing SNF, Patient wanting HH. MD order PT/RN/OT/aide. Patient is active with [facility] updated.     Final next level of care: [place]   Patient Goals and CMS Choice Patient states their goals for this hospitalization and ongoing recovery are:: to go home. CMS Medicare.gov Compare Post Acute Care list provided to:: Patient    Discharge Placement         Patient and family notified of of transfer: 03/23/20  Discharge Plan and Services      HH Arranged: PT, RN, OT, Social Work [facility] Agency: [place] ([place]) Date [place]: 03/23/20 Time [place]: 33 Representative spoke with at [place]: Romualdo Bolk    Readmission Risk Interventions Readmission Risk Prevention Plan 04/20/2019  Transportation Screening Complete  PCP or Specialist Appt within 5-7 Days Complete  Home Care Screening Complete  Medication Review (RN CM) Complete  Some recent data might be hidden

## 2020-03-23 NOTE — Progress Notes (Signed)
Physical Therapy Treatment Patient Details Name: Clifford [name] MRN: [id number] DOB: [birth year] Today's Date: 03/23/2020    History of Present Illness [name] is a 69 y.o. male with medical history significant for prostate cancer, alcoholism.Patient presented to the ED with complaints of about 1 week of black and tarry stools, dizziness when standing with generalized weakness.  He denies abdominal pain, no vomiting.  He denies NSAID use.    PT Comments    Pt able to ambulate further distance with RW, continues to be limited by runny stool and requires min assist due to unsteadiness. Constant verbal cues with stopping and repositioning body with RW frame, but little carryover requiring multiple cues to improve safety with RW. Pt tolerates sitting therapeutic exercises with cues for muscle activation and motor control with decreased momentum and remaining up in chair at EOS. Patient will benefit from continued physical therapy in hospital and recommendations below to increase strength, balance, endurance for safe ADLs and gait.    Follow Up Recommendations  SNF     Equipment Recommendations  None recommended by PT    Recommendations for Other Services       Precautions / Restrictions Precautions Precautions: Fall Restrictions Weight Bearing Restrictions: No    Mobility  Bed Mobility Overal bed mobility: Needs Assistance Bed Mobility: Supine to Sit  Supine to sit: Min guard;HOB elevated  General bed mobility comments: slow, labored movement with use of bedrail and elevated HOB  Transfers Overall transfer level: Needs assistance Equipment used: Rolling walker (2 wheeled) Transfers: Sit to/from [facility] Sit to Stand: Min guard Stand pivot transfers: Min assist  General transfer comment: min guard for steadying with rising up from seated surface, min assist with pivoting due to increased unsteadiness  Ambulation/Gait   Gait Distance (Feet): 30  Feet(additional 10 ft) Assistive device: Rolling walker (2 wheeled) Gait Pattern/deviations: Step-through pattern;Decreased stride length Gait velocity: decreased   General Gait Details: slow, unsteady steps with constant cues to maintain body within RW frame; first bout out of room then returned for restroom; additional small distance but limited due to loose stools   Stairs             Wheelchair Mobility    Modified Rankin (Stroke Patients Only)       Balance Overall balance assessment: Needs assistance Sitting-balance support: Feet supported;No upper extremity supported Sitting balance-Leahy Scale: Good Sitting balance - Comments: seated at EOB   Standing balance support: During functional activity;Bilateral upper extremity supported Standing balance-Leahy Scale: Poor Standing balance comment: reliant on RW for steadying           Cognition Arousal/Alertness: Awake/alert Behavior During Therapy: WFL for tasks assessed/performed Overall Cognitive Status: Within Functional Limits for tasks assessed                 Exercises General Exercises - Lower Extremity Ankle Circles/Pumps: Seated;Both;10 reps(BLE extended in recliner) Quad Sets: Seated;Both;10 reps(BLE extended in recliner) Short Arc Quad: Seated;Both;10 reps(BLE extended in recliner) Hip ABduction/ADduction: Seated;Both;10 reps(BLE extended in recliner)    General Comments        Pertinent Vitals/Pain Pain Assessment: No/denies pain    Home Living                      Prior Function            PT Goals (current goals can now be found in the care plan section) Acute Rehab PT Goals Patient Stated Goal: Recieve  more therapy prior to discharge home PT Goal Formulation: With patient Time For Goal Achievement: 03/27/20 Potential to Achieve Goals: Fair Progress towards PT goals: Progressing toward goals    Frequency    Min 3X/week      PT Plan Current plan remains  appropriate    Co-evaluation              AM-PAC PT "6 Clicks" Mobility   Outcome Measure  Help needed turning from your back to your side while in a flat bed without using bedrails?: A Lot Help needed moving from lying on your back to sitting on the side of a flat bed without using bedrails?: A Lot Help needed moving to and from a bed to a chair (including a wheelchair)?: A Little Help needed standing up from a chair using your arms (e.g., wheelchair or bedside chair)?: A Little Help needed to walk in hospital room?: A Little Help needed climbing 3-5 steps with a railing? : A Lot 6 Click Score: 15    End of Session Equipment Utilized During Treatment: Gait belt Activity Tolerance: Patient tolerated treatment well Patient left: with call bell/phone within reach;in chair;with chair alarm set Nurse Communication: Mobility status PT Visit Diagnosis: Unsteadiness on feet (R26.81);Other abnormalities of gait and mobility (R26.89);Muscle weakness (generalized) (M62.81)     Time: [id number] PT Time Calculation (min) (ACUTE ONLY): 25 min  Charges:  $Gait Training: 8-22 mins $Therapeutic Exercise: 8-22 mins                      Tori Toniqua Melamed PT, DPT 03/23/20, 10:13 AM

## 2020-03-25 LAB — CANCER ANTIGEN 19-9: CA 19-9: 72 U/mL — ABNORMAL HIGH (ref 0–35)

## 2020-04-06 ENCOUNTER — Ambulatory Visit (INDEPENDENT_AMBULATORY_CARE_PROVIDER_SITE_OTHER): Payer: Medicare Other | Admitting: Internal Medicine

## 2020-04-13 DEATH — deceased
